# Patient Record
Sex: Male | Born: 1945 | Race: White | Hispanic: No | Smoking: Current some day smoker
Health system: Southern US, Community
[De-identification: ages and names within clinical notes are randomized; demographics above are authoritative.]

## PROBLEM LIST (undated history)

## (undated) DIAGNOSIS — J449 Chronic obstructive pulmonary disease, unspecified: Secondary | ICD-10-CM

## (undated) DIAGNOSIS — I10 Essential (primary) hypertension: Secondary | ICD-10-CM

## (undated) DIAGNOSIS — E785 Hyperlipidemia, unspecified: Secondary | ICD-10-CM

## (undated) DIAGNOSIS — M1990 Unspecified osteoarthritis, unspecified site: Secondary | ICD-10-CM

## (undated) DIAGNOSIS — Z87442 Personal history of urinary calculi: Secondary | ICD-10-CM

## (undated) DIAGNOSIS — Z791 Long term (current) use of non-steroidal anti-inflammatories (NSAID): Secondary | ICD-10-CM

## (undated) DIAGNOSIS — C801 Malignant (primary) neoplasm, unspecified: Secondary | ICD-10-CM

## (undated) DIAGNOSIS — E119 Type 2 diabetes mellitus without complications: Secondary | ICD-10-CM

## (undated) DIAGNOSIS — K529 Noninfective gastroenteritis and colitis, unspecified: Principal | ICD-10-CM

## (undated) DIAGNOSIS — L570 Actinic keratosis: Secondary | ICD-10-CM

## (undated) HISTORY — DX: Actinic keratosis: L57.0

## (undated) HISTORY — DX: Chronic obstructive pulmonary disease, unspecified: J44.9

## (undated) HISTORY — PX: LITHOTRIPSY: SUR834

## (undated) HISTORY — DX: Noninfective gastroenteritis and colitis, unspecified: K52.9

## (undated) HISTORY — DX: Unspecified osteoarthritis, unspecified site: M19.90

## (undated) HISTORY — PX: BASAL CELL CARCINOMA EXCISION: SHX1214

## (undated) HISTORY — DX: Long term (current) use of non-steroidal anti-inflammatories (nsaid): Z79.1

## (undated) HISTORY — PX: CHOLECYSTECTOMY: SHX55

## (undated) HISTORY — PX: BACK SURGERY: SHX140

## (undated) HISTORY — PX: KNEE SURGERY: SHX244

## (undated) HISTORY — PX: SPINE SURGERY: SHX786

## (undated) HISTORY — DX: Hyperlipidemia, unspecified: E78.5

---

## 2002-07-23 ENCOUNTER — Ambulatory Visit (HOSPITAL_COMMUNITY): Admission: RE | Admit: 2002-07-23 | Discharge: 2002-07-23 | Payer: Self-pay | Admitting: Family Medicine

## 2002-07-23 ENCOUNTER — Encounter: Payer: Self-pay | Admitting: Family Medicine

## 2002-07-24 ENCOUNTER — Emergency Department (HOSPITAL_COMMUNITY): Admission: EM | Admit: 2002-07-24 | Discharge: 2002-07-24 | Payer: Self-pay | Admitting: Emergency Medicine

## 2003-08-27 ENCOUNTER — Ambulatory Visit (HOSPITAL_COMMUNITY): Admission: RE | Admit: 2003-08-27 | Discharge: 2003-08-27 | Payer: Self-pay | Admitting: Family Medicine

## 2003-09-09 ENCOUNTER — Ambulatory Visit (HOSPITAL_COMMUNITY): Admission: RE | Admit: 2003-09-09 | Discharge: 2003-09-09 | Payer: Self-pay | Admitting: Internal Medicine

## 2003-09-24 ENCOUNTER — Ambulatory Visit (HOSPITAL_COMMUNITY): Admission: RE | Admit: 2003-09-24 | Discharge: 2003-09-24 | Payer: Self-pay | Admitting: Family Medicine

## 2005-08-04 ENCOUNTER — Ambulatory Visit (HOSPITAL_COMMUNITY): Admission: RE | Admit: 2005-08-04 | Discharge: 2005-08-04 | Payer: Self-pay | Admitting: General Surgery

## 2005-09-19 ENCOUNTER — Other Ambulatory Visit: Admission: RE | Admit: 2005-09-19 | Discharge: 2005-09-19 | Payer: Self-pay | Admitting: General Surgery

## 2006-08-28 ENCOUNTER — Ambulatory Visit (HOSPITAL_COMMUNITY): Admission: RE | Admit: 2006-08-28 | Discharge: 2006-08-28 | Payer: Self-pay | Admitting: Family Medicine

## 2006-09-19 ENCOUNTER — Ambulatory Visit: Payer: Self-pay | Admitting: Orthopedic Surgery

## 2006-10-10 ENCOUNTER — Ambulatory Visit: Payer: Self-pay | Admitting: Orthopedic Surgery

## 2006-10-17 ENCOUNTER — Ambulatory Visit: Payer: Self-pay | Admitting: Orthopedic Surgery

## 2006-10-25 ENCOUNTER — Ambulatory Visit: Payer: Self-pay | Admitting: Orthopedic Surgery

## 2006-11-09 ENCOUNTER — Ambulatory Visit: Payer: Self-pay | Admitting: Orthopedic Surgery

## 2006-11-09 ENCOUNTER — Ambulatory Visit (HOSPITAL_COMMUNITY): Admission: RE | Admit: 2006-11-09 | Discharge: 2006-11-09 | Payer: Self-pay | Admitting: Orthopedic Surgery

## 2006-11-12 ENCOUNTER — Encounter (HOSPITAL_COMMUNITY): Admission: RE | Admit: 2006-11-12 | Discharge: 2006-12-12 | Payer: Self-pay | Admitting: Orthopedic Surgery

## 2006-11-13 ENCOUNTER — Ambulatory Visit: Payer: Self-pay | Admitting: Orthopedic Surgery

## 2006-12-11 ENCOUNTER — Ambulatory Visit: Payer: Self-pay | Admitting: Orthopedic Surgery

## 2010-01-04 ENCOUNTER — Ambulatory Visit (HOSPITAL_COMMUNITY): Admission: RE | Admit: 2010-01-04 | Discharge: 2010-01-04 | Payer: Self-pay | Admitting: Family Medicine

## 2010-01-05 ENCOUNTER — Ambulatory Visit: Payer: Self-pay | Admitting: Gastroenterology

## 2010-01-05 ENCOUNTER — Inpatient Hospital Stay (HOSPITAL_COMMUNITY): Admission: AD | Admit: 2010-01-05 | Discharge: 2010-01-08 | Payer: Self-pay | Admitting: Family Medicine

## 2010-01-06 ENCOUNTER — Ambulatory Visit: Payer: Self-pay | Admitting: Internal Medicine

## 2010-01-07 ENCOUNTER — Encounter (INDEPENDENT_AMBULATORY_CARE_PROVIDER_SITE_OTHER): Payer: Self-pay | Admitting: Family Medicine

## 2010-05-31 ENCOUNTER — Ambulatory Visit (HOSPITAL_COMMUNITY): Admission: RE | Admit: 2010-05-31 | Discharge: 2010-05-31 | Payer: Self-pay | Admitting: Family Medicine

## 2010-07-15 ENCOUNTER — Ambulatory Visit (HOSPITAL_COMMUNITY)
Admission: RE | Admit: 2010-07-15 | Discharge: 2010-07-16 | Payer: Self-pay | Source: Home / Self Care | Attending: Neurological Surgery | Admitting: Neurological Surgery

## 2010-10-18 LAB — SURGICAL PCR SCREEN
MRSA, PCR: NEGATIVE
Staphylococcus aureus: NEGATIVE

## 2010-10-18 LAB — DIFFERENTIAL
Basophils Absolute: 0 10*3/uL (ref 0.0–0.1)
Eosinophils Absolute: 0.2 10*3/uL (ref 0.0–0.7)

## 2010-10-18 LAB — BASIC METABOLIC PANEL
CO2: 28 mEq/L (ref 19–32)
Chloride: 104 mEq/L (ref 96–112)
Creatinine, Ser: 0.97 mg/dL (ref 0.4–1.5)
Potassium: 4.2 mEq/L (ref 3.5–5.1)

## 2010-10-18 LAB — CBC
HCT: 47.6 % (ref 39.0–52.0)
Hemoglobin: 16.1 g/dL (ref 13.0–17.0)
Platelets: 212 10*3/uL (ref 150–400)
RBC: 4.98 MIL/uL (ref 4.22–5.81)
WBC: 12.6 10*3/uL — ABNORMAL HIGH (ref 4.0–10.5)

## 2010-10-18 LAB — APTT: aPTT: 31 seconds (ref 24–37)

## 2010-10-18 LAB — PROTIME-INR: INR: 0.97 (ref 0.00–1.49)

## 2010-10-24 LAB — GLUCOSE, CAPILLARY
Glucose-Capillary: 124 mg/dL — ABNORMAL HIGH (ref 70–99)
Glucose-Capillary: 226 mg/dL — ABNORMAL HIGH (ref 70–99)

## 2010-10-24 LAB — LIPASE, BLOOD
Lipase: 118 U/L — ABNORMAL HIGH (ref 11–59)
Lipase: 170 U/L — ABNORMAL HIGH (ref 11–59)
Lipase: 35 U/L (ref 11–59)
Lipase: 79 U/L — ABNORMAL HIGH (ref 11–59)

## 2010-10-24 LAB — DIFFERENTIAL
Basophils Absolute: 0.1 10*3/uL (ref 0.0–0.1)
Basophils Relative: 0 % (ref 0–1)
Basophils Relative: 1 % (ref 0–1)
Eosinophils Absolute: 0.3 10*3/uL (ref 0.0–0.7)
Eosinophils Absolute: 0.3 10*3/uL (ref 0.0–0.7)
Eosinophils Absolute: 0.3 10*3/uL (ref 0.0–0.7)
Eosinophils Relative: 2 % (ref 0–5)
Eosinophils Relative: 3 % (ref 0–5)
Lymphocytes Relative: 23 % (ref 12–46)
Lymphs Abs: 1.8 10*3/uL (ref 0.7–4.0)
Lymphs Abs: 2.2 10*3/uL (ref 0.7–4.0)
Monocytes Absolute: 0.7 10*3/uL (ref 0.1–1.0)
Neutro Abs: 7.5 10*3/uL (ref 1.7–7.7)
Neutrophils Relative %: 71 % (ref 43–77)

## 2010-10-24 LAB — PROTIME-INR: Prothrombin Time: 13.4 seconds (ref 11.6–15.2)

## 2010-10-24 LAB — CBC
HCT: 39.8 % (ref 39.0–52.0)
HCT: 41.7 % (ref 39.0–52.0)
Hemoglobin: 14.4 g/dL (ref 13.0–17.0)
Hemoglobin: 16.3 g/dL (ref 13.0–17.0)
MCHC: 34.5 g/dL (ref 30.0–36.0)
MCV: 94.6 fL (ref 78.0–100.0)
MCV: 94.9 fL (ref 78.0–100.0)
MCV: 96.2 fL (ref 78.0–100.0)
Platelets: 216 10*3/uL (ref 150–400)
Platelets: 227 10*3/uL (ref 150–400)
RBC: 4.43 MIL/uL (ref 4.22–5.81)
RDW: 14.2 % (ref 11.5–15.5)
WBC: 10.6 10*3/uL — ABNORMAL HIGH (ref 4.0–10.5)
WBC: 11.5 10*3/uL — ABNORMAL HIGH (ref 4.0–10.5)

## 2010-10-24 LAB — HEPATIC FUNCTION PANEL
ALT: 97 U/L — ABNORMAL HIGH (ref 0–53)
Albumin: 3 g/dL — ABNORMAL LOW (ref 3.5–5.2)
Alkaline Phosphatase: 164 U/L — ABNORMAL HIGH (ref 39–117)
Bilirubin, Direct: 1.2 mg/dL — ABNORMAL HIGH (ref 0.0–0.3)
Indirect Bilirubin: 1.1 mg/dL — ABNORMAL HIGH (ref 0.3–0.9)
Indirect Bilirubin: 2 mg/dL — ABNORMAL HIGH (ref 0.3–0.9)
Total Protein: 5.7 g/dL — ABNORMAL LOW (ref 6.0–8.3)
Total Protein: 5.9 g/dL — ABNORMAL LOW (ref 6.0–8.3)

## 2010-10-24 LAB — COMPREHENSIVE METABOLIC PANEL
ALT: 146 U/L — ABNORMAL HIGH (ref 0–53)
Albumin: 3.8 g/dL (ref 3.5–5.2)
Alkaline Phosphatase: 247 U/L — ABNORMAL HIGH (ref 39–117)
BUN: 23 mg/dL (ref 6–23)
Calcium: 9.3 mg/dL (ref 8.4–10.5)
Creatinine, Ser: 0.88 mg/dL (ref 0.4–1.5)
GFR calc Af Amer: >60 mL/min (ref >60)
GFR calc non Af Amer: >60 mL/min (ref >60)
Glucose, Bld: 94 mg/dL (ref 70–99)
Total Protein: 7.1 g/dL (ref 6.0–8.3)

## 2010-10-24 LAB — AMYLASE: Amylase: 256 U/L — ABNORMAL HIGH (ref 0–105)

## 2010-10-24 LAB — BASIC METABOLIC PANEL
BUN: 15 mg/dL (ref 6–23)
BUN: 6 mg/dL (ref 6–23)
CO2: 27 mEq/L (ref 19–32)
CO2: 28 mEq/L (ref 19–32)
Chloride: 102 mEq/L (ref 96–112)
Glucose, Bld: 101 mg/dL — ABNORMAL HIGH (ref 70–99)
Glucose, Bld: 121 mg/dL — ABNORMAL HIGH (ref 70–99)
Potassium: 3.7 mEq/L (ref 3.5–5.1)

## 2010-10-24 LAB — APTT: aPTT: 30 seconds (ref 24–37)

## 2010-10-24 LAB — CULTURE, BLOOD (ROUTINE X 2)
Culture: NO GROWTH
Report Status: 6062011

## 2010-10-26 ENCOUNTER — Other Ambulatory Visit: Payer: Self-pay | Admitting: Dermatology

## 2010-12-06 DIAGNOSIS — K529 Noninfective gastroenteritis and colitis, unspecified: Secondary | ICD-10-CM

## 2010-12-06 HISTORY — DX: Noninfective gastroenteritis and colitis, unspecified: K52.9

## 2010-12-22 ENCOUNTER — Encounter (HOSPITAL_COMMUNITY): Payer: Self-pay | Admitting: Radiology

## 2010-12-22 ENCOUNTER — Emergency Department (HOSPITAL_COMMUNITY): Payer: Medicare Other

## 2010-12-22 ENCOUNTER — Observation Stay (HOSPITAL_COMMUNITY)
Admission: EM | Admit: 2010-12-22 | Discharge: 2010-12-23 | DRG: 392 | Disposition: A | Discharge status: Home / Self Care | Payer: Medicare Other | Attending: Family Medicine | Admitting: Family Medicine

## 2010-12-22 DIAGNOSIS — Z791 Long term (current) use of non-steroidal anti-inflammatories (NSAID): Secondary | ICD-10-CM

## 2010-12-22 DIAGNOSIS — K5289 Other specified noninfective gastroenteritis and colitis: Secondary | ICD-10-CM

## 2010-12-22 DIAGNOSIS — Z7982 Long term (current) use of aspirin: Secondary | ICD-10-CM

## 2010-12-22 DIAGNOSIS — R109 Unspecified abdominal pain: Secondary | ICD-10-CM | POA: Insufficient documentation

## 2010-12-22 DIAGNOSIS — E785 Hyperlipidemia, unspecified: Secondary | ICD-10-CM | POA: Diagnosis present

## 2010-12-22 DIAGNOSIS — J4489 Other specified chronic obstructive pulmonary disease: Secondary | ICD-10-CM | POA: Diagnosis present

## 2010-12-22 DIAGNOSIS — A09 Infectious gastroenteritis and colitis, unspecified: Principal | ICD-10-CM | POA: Diagnosis present

## 2010-12-22 DIAGNOSIS — J449 Chronic obstructive pulmonary disease, unspecified: Secondary | ICD-10-CM | POA: Diagnosis present

## 2010-12-22 DIAGNOSIS — M48061 Spinal stenosis, lumbar region without neurogenic claudication: Secondary | ICD-10-CM | POA: Diagnosis present

## 2010-12-22 DIAGNOSIS — I10 Essential (primary) hypertension: Secondary | ICD-10-CM | POA: Diagnosis present

## 2010-12-22 DIAGNOSIS — K625 Hemorrhage of anus and rectum: Secondary | ICD-10-CM

## 2010-12-22 HISTORY — DX: Essential (primary) hypertension: I10

## 2010-12-22 LAB — URINE MICROSCOPIC-ADD ON

## 2010-12-22 LAB — DIFFERENTIAL
Basophils Absolute: 0 10*3/uL (ref 0.0–0.1)
Eosinophils Absolute: 0.1 10*3/uL (ref 0.0–0.7)
Lymphs Abs: 2.8 10*3/uL (ref 0.7–4.0)
Monocytes Absolute: 0.8 10*3/uL (ref 0.1–1.0)
Monocytes Relative: 6 % (ref 3–12)

## 2010-12-22 LAB — COMPREHENSIVE METABOLIC PANEL
Albumin: 4.1 g/dL (ref 3.5–5.2)
BUN: 15 mg/dL (ref 6–23)
Creatinine, Ser: 0.74 mg/dL (ref 0.4–1.5)
Glucose, Bld: 107 mg/dL — ABNORMAL HIGH (ref 70–99)
Total Bilirubin: 0.9 mg/dL (ref 0.3–1.2)
Total Protein: 6.8 g/dL (ref 6.0–8.3)

## 2010-12-22 LAB — URINALYSIS, ROUTINE W REFLEX MICROSCOPIC
Leukocytes, UA: NEGATIVE
Protein, ur: NEGATIVE mg/dL
Urobilinogen, UA: 0.2 mg/dL (ref 0.0–1.0)

## 2010-12-22 LAB — CBC
Hemoglobin: 17.3 g/dL — ABNORMAL HIGH (ref 13.0–17.0)
MCHC: 34.9 g/dL (ref 30.0–36.0)
RDW: 13.1 % (ref 11.5–15.5)

## 2010-12-22 LAB — LIPASE, BLOOD: Lipase: 16 U/L (ref 11–59)

## 2010-12-22 MED ORDER — IOHEXOL 300 MG/ML  SOLN
100.0000 mL | Freq: Once | INTRAMUSCULAR | Status: AC | PRN
  Administered 2010-12-22: 100 mL via INTRAVENOUS

## 2010-12-23 LAB — CBC
Hemoglobin: 15.6 g/dL (ref 13.0–17.0)
MCH: 32.1 pg (ref 26.0–34.0)
MCV: 95.9 fL (ref 78.0–100.0)
RBC: 4.86 MIL/uL (ref 4.22–5.81)

## 2010-12-23 LAB — DIFFERENTIAL
Lymphocytes Relative: 23 % (ref 12–46)
Lymphs Abs: 2.5 10*3/uL (ref 0.7–4.0)
Monocytes Relative: 5 % (ref 3–12)
Neutro Abs: 7.5 10*3/uL (ref 1.7–7.7)
Neutrophils Relative %: 70 % (ref 43–77)

## 2010-12-23 NOTE — Op Note (Signed)
NAMEJONI, NORROD                  ACCOUNT NO.:  [id number]   MEDICAL RECORD NO.:  [id number]          PATIENT TYPE:  AMB   LOCATION:  DAY                           FACILITY:  [facility]   PHYSICIAN:  Vickki Hearing, M.D.DATE OF BIRTH:  1946/07/01   DATE OF PROCEDURE:  11/09/2006  DATE OF DISCHARGE:                               OPERATIVE REPORT   CHIEF COMPLAINT:  Pain, left knee.   PRIMARY INDICATION FOR PROCEDURE:  Persistent pain, left knee, which did  not respond to nonoperative treatment.   PREOPERATIVE DIAGNOSIS:  Torn medial meniscus and osteoarthritis, left  knee.   POSTOPERATIVE DIAGNOSES:  1. Torn medial meniscus and osteoarthritis, left knee.  2. Medial plica.   PROCEDURE:  1. Arthroscopy of left knee.  2. Partial medial meniscectomy.  3. Chondroplasty, medial femoral condyle.  4. Resection of medial plica.   SURGEON:  Vickki Hearing, M.D.   ASSISTANTS:  No assistants.   ANESTHETIC:  LMA/general.   OPERATIVE FINDINGS:  Torn medial meniscus at the far and most posterior  aspect of the medial horn; it was a radial tear.  There were chondral  changes, grade 2 in depth, grade 3 in area of the medial femoral  condyle.  There were grade 2 changes also on the trochlea.  There was a  plica medially.   DESCRIPTION OF PROCEDURE:  Procedure was done as follows:  The left knee  was marked for surgery, countersigned by the surgeon, history and  physical updated, antibiotic started.  The patient was taken to the  operating room and given an LMA anesthetic.  Sterile prep and drape were  performed.  Time-out procedure was completed.   Standard arthroscopy portals were established.  Diagnostic arthroscopy  was performed.  Through a medial portal, a probe was placed and a second-  look using a probe to palpate the articular structures was performed.  A  shaver was placed in the knee and the medial meniscus tear was resected  and then balanced with a shaver and a  duckbill forceps.  The  chondroplasty on the patella was performed with a Paragon arthroscopy  instrument and the plica was resected with the shaver and a biter.   The knee was irrigated and closed with Steri-Strips.  We placed 60 mL of  Marcaine total in the joint, 30 preop, 30 postop.  We added sterile  dressings, Ace bandage, Cryo Cuff and he was taken to recovery room  after extubation in stable condition and can be full weightbearing.  Follow up next week and he will have therapy next week.      Vickki Hearing, M.D.  Electronically Signed     SEH/MEDQ  D:  11/09/2006  T:  11/10/2006  Job:  [id number]

## 2010-12-23 NOTE — H&P (Signed)
NAMEERYK, BEAVERS                  ACCOUNT NO.:  [id number]   MEDICAL RECORD NO.:  [id number]           PATIENT TYPE:   LOCATION:                                 FACILITY:   PHYSICIAN:  Vickki Hearing, M.D.DATE OF BIRTH:  1945/12/03   DATE OF ADMISSION:  10/25/2006  DATE OF DISCHARGE:  LH                              HISTORY & PHYSICAL   CHIEF COMPLAINT:  Pain, left knee.   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   MEDICATIONS:  He takes Micardis, Crestor, [place] and Chantix.   FAMILY HISTORY:  He has a family history of heart and lung disease,  arthritis and cancer.   SOCIAL HISTORY:  He is a nonsmoker, social drinker, with an associates  degree.   ALLERGIES:  NO ALLERGIES.   REVIEW OF SYSTEMS:  Left knee pain.   HISTORY OF PRESENT ILLNESS:  He started having left knee pain in  December of 2007.  His pain started laterally, radiated down the side of  his leg into his ankle and then rested behind his knee cap.  His pain is  rated as 5/10, but can be as high as 10.  His symptoms were unrelieved  by Naprosyn but improved with Vicodin and cold packs, was made worse by  walking and kneeling.  The pain he feels is described as an ache which  is constant and associated with knee swelling.   HOSPITAL COURSE:  After evaluation here showed that he did have some  patellofemoral disease on x-ray, we aspirated and injected his knee;  aspirated 12 mL of clear yellow fluid, injected 40 mg of Depo-Medrol,  and he was seen in follow-up.  At the time of follow-up his pain had not  been relieved and his swelling had returned, so we sent him for an  initial MRI evaluation.  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  He was sent back  for an IV contrast MRI which revealed a small tumor which was apparently  benign and could be followed in a 80-month period with repeat IV contrast  MRI.  It also revealed that he did, indeed, have a medial meniscal tear  and, for that reason, he has been scheduled for surgery to address that;  he may need a chondroplasty, as well.   The informed consent process has been completed; the patient understands  his treatment plan and agrees with it.   PHYSICAL EXAMINATION:  VITAL SIGNS:  On exam his weight is 235, normal  pulses in low 80s to 86, respiratory rate in low teens to 16.  GENERAL  APPEARANCE:  He is well-developed and nourished, grooming and  hygiene are normal.  Body habitus is medium.  Peripheral vascular system  has normal pulse, perfusion and temperature; no swelling or edema.  No  varicose veins of any significance.  Groin adenopathy negative.  Gait  and station are normal.  EXTREMITIES:  The right knee shows full range of motion, no swelling,  mild varus.  Motor exam normal.  Ligaments are intact.  Left knee  examination shows continued trace amount of fluid, tenderness at the  inferior pole of the patella and medial compartment.  There is varus  alignment to the knee.  Motor strength is normal.  Ligaments are stable.  Meniscal signs remain relatively normal.  SKIN:  Examination reveals  warm, dry and intact skin with no rash, lesions or subcutaneous mass.  NEUROLOGIC:  Findings include normal coordination, reflexes and  sensation.  PSYCHIATRIC:  Findings include normal mood and he is alert and oriented  x3.   MEDICAL DECISION-MAKING:  Radiographs, again, show patellofemoral  disease.   MRI was done at [facility] and shows a radial tear of the medial  meniscus posterior horn, and cartilage defect in the weightbearing  surface of the medial femoral condyle.  There is a loculated cyst which  reflects intra-articular  fluid and there is a small circumcised focus  which is fusiform, abutting the medial gastroc with post-contrast and  enhancement, consistent with tumor.  I have discussed this with the  radiologist and he agrees that this can be followed with a repeat  contrast MRI via the IV method.  In the interim, he will have  arthroscopy of the left knee.      Vickki Hearing, M.D.  Electronically Signed     SEH/MEDQ  D:  10/25/2006  T:  10/25/2006  Job:  [id number]

## 2010-12-25 NOTE — Group Therapy Note (Signed)
  [name], [name]                  ACCOUNT NO.:  [id number]  MEDICAL RECORD NO.:  [id number]           PATIENT TYPE:  I  LOCATION:  A330                          FACILITY:  [facility]  PHYSICIAN:  Kingsley Callander. Ouida Sills, MD       DATE OF BIRTH:  June 09, 1946  DATE OF PROCEDURE: DATE OF DISCHARGE:                                PROGRESS NOTE   HISTORY OF PRESENT ILLNESS:  Mr. Brockman was admitted yesterday after experiencing rectal bleeding for 2 days.  He underwent evaluation in the emergency room with a CT scan which revealed evidence of colitis at the hepatic flexure.  His hemoglobin on admission was 17.3.  He denies any bleeding since that time.  He has been hemodynamically stable.  His blood pressure today is 119/68 with a pulse of 70.  Temperature is 98.3, respirations are 16.  He denies any abdominal pain now.  He had a barium enema he reports in 2005.  This was read as negative.  The patient has recently been on aspirin 81 mg daily and naproxen 500 mg b.i.d.  He stopped these 2 days ago.  PHYSICAL EXAMINATION:  GENERAL:  He appears comfortable.  He is alert and oriented. LUNGS:  Clear. HEART:  Regular with no murmurs. ABDOMEN:  Soft and nontender with no hepatosplenomegaly.  IMPRESSION/PLAN: 1. Rectal bleeding.  Hepatic flexure colitis.  Gastroenterology has     been consulted.  His hemoglobin will be rechecked today. 2. History of hypertension treated with Micardis and verapamil.  These     are both on hold. 3. History of hyperlipidemia.  Continue Crestor. 4. Status post cholecystectomy after a bout of gallstone pancreatitis     in June 2011. 5. Status post spinal stenosis surgery December 2011.     Kingsley Callander. Ouida Sills, MD     ROF/MEDQ  D:  12/23/2010  T:  12/23/2010  Job:  [id number]  Electronically Signed by Carylon Perches MD on 12/25/2010 10:32:35 AM

## 2010-12-27 LAB — STOOL CULTURE

## 2010-12-27 NOTE — H&P (Signed)
  [name], [name]                  ACCOUNT NO.:  [id number]  MEDICAL RECORD NO.:  [id number]           PATIENT TYPE:  I  LOCATION:  A330                          FACILITY:  [facility]  PHYSICIAN:  Luie Laneve G. Renard Matter, MD   DATE OF BIRTH:  12-07-1945  DATE OF ADMISSION:  12/22/2010 DATE OF DISCHARGE:  LH                             HISTORY & PHYSICAL   This 65 year old white male presented to the ED with a chief complaint of rectal bleeding.  Apparently, he noticed this last evening and more today.  He has several stools yesterday and noted blood.  He apparently did not have any abdominal pain of any severity.  This was thought by ED physician to be bloody diarrhea, also had some nausea.  He was seen and evaluated by ED physician.  His hemoglobin is 17.3, hematocrit 49.6. Chemistries were within normal limits.  UA negative.  The patient had CT of the abdomen, which showed focal segmental colitis involving hepatic flexure likely inflammatory or infections.  IV fluid was started and the patient had 2 units of packed RBCs typed and screened.  The patient was subsequently admitted.  SOCIAL HISTORY:  Does not drink alcohol or use drugs.  He currently smokes.  PAST MEDICAL HISTORY:  Hypertension, arthritis and hyperlipidemia.  SURGICAL HISTORY:  The patient had cholecystectomy, does have chronic obstructive pulmonary disease, has a history of decompressive lumbar hemilaminectomy, medial facetectomy.  ALLERGIES:  No known drug allergies.  MEDICATION LIST: 1. Crestor 10 mg daily. 2. Micardis daily. 3. Naprosyn 500 mg b.i.d. 4. Verapamil daily.  REVIEW OF SYSTEMS:  HEENT:  Negative.  CARDIOPULMONARY:  No cough, hemoptysis or dyspnea.  GI:  Episodes of nausea, minimal abdominal pain, rectal bleeding.  GU:  No dysuria or hematuria.  PHYSICAL EXAMINATION:  GENERAL/VITAL SIGNS:  Alert white male with blood pressure 135/80, pulse 67, respirations 16. HEENT:  Eyes: PERRLA.  TM negative.   Oropharynx benign. NECK:  Supple.  No JVD or thyroid abnormalities. HEART:  Regular rhythm, no murmurs. LUNGS:  Clear to P and A. ABDOMEN:  No palpable organs or masses. RECTAL:  Gross blood on examining finger. EXTREMITIES:  Free of edema. NEUROLOGIC:  No focal deficit.  Cranial nerves intact.  ASSESSMENT:  The patient was admitted with rectal bleeding of undetermined source.  He does have on CT of the abdomen focal segmental colitis involving the hepatic flexure likely inflammatory or infectious.  PLAN:  Plan is to start the patient on IV fluids.  Continue to monitor hemoglobin and hematocrit.  We will type and screen for blood as needed. GI Service has been consulted.     Nellene Courtois G. Renard Matter, MD     AGM/MEDQ  D:  12/22/2010  T:  12/23/2010  Job:  [id number]  Electronically Signed by Butch Penny MD on 12/27/2010 07:09:38 AM

## 2010-12-29 NOTE — Discharge Summary (Signed)
  [name], [name]                  ACCOUNT NO.:  [id number]  MEDICAL RECORD NO.:  [id number]           PATIENT TYPE:  I  LOCATION:  A330                          FACILITY:  [facility]  PHYSICIAN:  Kingsley Callander. Ouida Sills, MD       DATE OF BIRTH:  1945/12/14  DATE OF ADMISSION:  12/22/2010 DATE OF DISCHARGE:  05/18/2012LH                              DISCHARGE SUMMARY   DISCHARGE DIAGNOSES: 1. Hepatic flexure colitis with rectal bleeding. 2. Hypertension. 3. Hyperlipidemia. 4. Spinal stenosis.  HOSPITAL COURSE:  This patient is a 65 year old male patient of Dr. Janna Arch who was admitted by Dr. Renard Matter on Thursday with rectal bleeding.  He underwent a CT scan in the emergency room which revealed evidence of colitis at his hepatic flexure.  His hemoglobin was 17.3. His white count was 13,200.  The patient recently had some diarrhea.  He was seen in gastroenterology consultation by Dr. Darrick Penna.  He was felt to likely be suffering from an infectious colitis and was treated with ciprofloxacin and metronidazole.  His hemoglobin dropped to 15.  He was hemodynamically stable.  Consideration was given to colonoscopy which can be performed as an outpatient within the next couple of weeks.  The patient was improved and stable for discharge on the afternoon of the 18th.  He will be seen in followup by Dr. Janna Arch next week.  He will hold aspirin and naproxen.  He will have a low-residue diet.  DISCHARGE MEDICATIONS: 1. Micardis HCT 40/12.5 mg daily. 2. Verapamil SR 240 mg daily. 3. Crestor 10 mg daily. 4. Multivitamin daily. 5. Cipro 500 mg b.i.d. 6. Metronidazole 500 mg t.i.d.     Kingsley Callander. Ouida Sills, MD     ROF/MEDQ  D:  12/27/2010  T:  12/27/2010  Job:  [id number]  Electronically Signed by Carylon Perches MD on 12/29/2010 07:55:24 AM

## 2011-01-24 NOTE — Consult Note (Signed)
[name], [name]                  ACCOUNT NO.:  [id number]  MEDICAL RECORD NO.:  [id number]           PATIENT TYPE:  I  LOCATION:  A330                          FACILITY:  [facility]  PHYSICIAN:  [name], M.D.     DATE OF BIRTH:  1945/08/13  DATE OF CONSULTATION:  12/23/2010 DATE OF DISCHARGE:                                CONSULTATION   PHYSICIAN REQUESTING CONSULTATION:  Dr. Carylon Perches covering for Dr. Oval Linsey.  COSIGNER:  John Eva, MD  REASON FOR CONSULTATION:  Rectal bleeding.  HISTORY OF PRESENT ILLNESS:  The patient is a very pleasant 65 year old Caucasian gentleman who presented to the emergency department yesterday evening with complaints of several episodes of moderate volume hematochezia.  He states earlier in the week he started having abdominal cramping and loose stools as well as nausea.  Wednesday after several episodes of diarrhea, he started to pass some blood per rectum.  He had several episodes and decided to come to the emergency department last night.  He complains of mid abdominal pain.  Last week he felt fine.  He has had no vomiting.  He has been on clear liquids for about 2-3 days.  In the emergency department, he had a hemoglobin of 17.3, white blood cell count 13,200, platelets appeared to be clumped but appeared be adequate.  On review, his lipase was normal at 16.  Sodium 138, potassium 4.1, BUN 15, creatinine 0.74, glucose 107, LFTs were normal. He had a CT of the abdomen and pelvis with contrast which showed a 3.3 cm simple-appearing epicardial cyst on the right side, mild fatty liver, terminal ileum appeared normal, appendix appeared normal, focal segmental wall thickening and submucosal edema involving the hepatic flexure.  It was felt this is likely due to inflammatory infectious process given that his SMA was widely patent.  There was no mesenteric edema.  This morning he states he feels fine.  He had clear liquids  for breakfast.  No bowel movements this morning.  MEDICATIONS AT HOME: 1. Micardis HCT 40/12.5 mg daily. 2. Verapamil SR 240 mg daily. 3. Crestor 10 mg daily. 4. Multivitamins daily. 5. Vicodin 10/325 mg every 6 hours as needed. 6. Aspirin 81 mg daily. 7. He also takes naproxen 500 mg b.i.d. and he has been on this for     years.  ALLERGIES:  No known drug allergies.  PAST MEDICAL HISTORY:  Hyperlipidemia, hypertension, arthritis requiring chronic naproxen use, history of nephrolithiasis, COPD, actinic keratosis.  PAST SURGICAL HISTORY: 1. He had lithotripsy in the 80s. 2. Cholecystectomy for gallstone pancreatitis and cholecystitis in     June 2011. 3. Back surgery in December 2011. 4. Last month he had basal cell carcinoma removed from his face. 5. He had left knee arthroscopy in April 2008.  FAMILY HISTORY:  He denies any family history of colon cancer, colon polyps.  His father died of lung cancer.  Mother is living and is a Agricultural consultant in the gift shop at the hospital.  SOCIAL HISTORY:  He smokes "too much."  He denies any alcohol or drug use.  He is  divorced.  No children.  He is a Surveyor, quantity at [employer].  REVIEW OF SYSTEMS:  See HPI for GI.  CONSTITUTIONAL:  Denies any weight loss.  CARDIOPULMONARY:  Denies chest pain, shortness of breath, cough. GENITOURINARY:  Denies dysuria, hematuria.  PHYSICAL EXAMINATION:  VITAL SIGNS:  Weight has not been obtained. Temperature 98.3, pulse 70, respirations 16, blood pressure 119/68, O2 sats 91% on room air. GENERAL:  Pleasant, elderly Caucasian gentleman, in no acute distress. SKIN:  Warm and dry.  No jaundice. HEENT:  Sclerae nonicteric.  He has a scab to the right of the nose from recent removal of basal cell carcinoma.  It appears to be healing nicely.  Oropharyngeal mucosa moist and pink.  No lesions. CHEST:  Lungs are clear to auscultation. CARDIAC:  Regular rate and rhythm.  No murmurs. ABDOMEN:   Positive bowel sounds, soft, nontender, nondistended.  No organomegaly or masses.  No rebound or guarding.  No abdominal bruits or hernias. LOWER EXTREMITIES:  No edema.  LABORATORY DATA:  As outlined above.  CT as outlined above.  IMPRESSION:  [name] is a pleasant 65 year old gentleman who presented with several-day history of diarrhea and subsequently moderate volume hematochezia associated with abdominal pain and nausea.  Symptoms were acute onset.  CT shows segmental colitis of the hepatic flexure. Differential diagnosis includes infectious process, less likely inflammatory or ischemic colitis.  No evidence of mass suggestive of colon cancer.  He is on chronic naproxen and aspirin use.  He has never had a colonoscopy.  Not mentioned above, he did have a barium enema in 2005 which was negative.  No prior colonoscopy.  RECOMMENDATIONS: 1. We will treat this as an infectious process.  We will start Cipro     and Flagyl. 2. Begin low residue diet.  He should continue this for 2 weeks.     Handout will be provided prior to his discharge. 3. Followup pending CBC.  If he is able to tolerate food, has no     further significant bleeding, the pain is     manageable, he may be able to go home later this afternoon.  Dr.     Darrick Penna will evaluate him and make decision. 4. Colonoscopy in 3-4 weeks unless he develops significant bleeding,     which would necessitate colonoscopy more acutely.  I would like to     thank Dr. Ouida Sills for allowing [place] to participate in the care of this     patient.     Tana Coast, P.A.   ______________________________ [name], M.D.    LL/MEDQ  D:  12/23/2010  T:  12/23/2010  Job:  [id number]  cc:   Melvyn Novas, MD Fax: [phone]  Kingsley Callander. Ouida Sills, MD Fax: [phone]  Electronically Signed by Tana Coast P.A. on 12/23/2010 02:51:23 PM Electronically Signed by [name] M.D. on 01/24/2011 01:13:30 PM

## 2011-01-25 ENCOUNTER — Ambulatory Visit (INDEPENDENT_AMBULATORY_CARE_PROVIDER_SITE_OTHER): Payer: Medicare Other | Admitting: Gastroenterology

## 2011-01-25 ENCOUNTER — Encounter: Payer: Self-pay | Admitting: Gastroenterology

## 2011-01-25 DIAGNOSIS — K529 Noninfective gastroenteritis and colitis, unspecified: Secondary | ICD-10-CM | POA: Insufficient documentation

## 2011-01-25 DIAGNOSIS — K5289 Other specified noninfective gastroenteritis and colitis: Secondary | ICD-10-CM

## 2011-01-25 DIAGNOSIS — Z1211 Encounter for screening for malignant neoplasm of colon: Secondary | ICD-10-CM

## 2011-01-25 NOTE — Assessment & Plan Note (Signed)
AVERAGE RISK.  TCS June 26 MOVIPREP. OPV PRN.

## 2011-01-25 NOTE — Progress Notes (Signed)
  Subjective:    Patient ID: [name], male    DOB: 26-Mar-1946, 65 y.o.   MRN: [id number]  PCP: DONDIEGO  HPI No more rectal bleeding or abd pain. Bms: 1x/day. No d iarrhea, weight loss, or change in bowel habits. Eating less firber since having colitis.  Past Medical History  Diagnosis Date  . Hypertension   . Colitis MAY 2012     CT ABD/PELVIS HEP FLEXURE  . Hyperlipemia   . Arthritis   . NSAID long-term use NAPROXEN FOR OA  . COPD (chronic obstructive pulmonary disease)   . Actinic keratosis   . Kidney stones    Past Surgical History  Procedure Date  . Cholecystectomy JUNE 2011 MJ    STONES, PANCREATITIS  . Back surgery   . Basal cell carcinoma excision FACE  . Knee surgery LEFT  . Lithotripsy 80s   No Known Allergies Current Outpatient Prescriptions  Medication Sig Dispense Refill  . aspirin 81 MG tablet Take 81 mg by mouth daily.        . rosuvastatin (CRESTOR) 10 MG tablet Take 10 mg by mouth daily.        Marland Kitchen telmisartan-hydrochlorothiazide (MICARDIS HCT) 40-12.5 MG per tablet Take 1 tablet by mouth daily.        . verapamil (COVERA HS) 240 MG (CO) 24 hr tablet Take 240 mg by mouth at bedtime.         Family History  Problem Relation Age of Onset  . Colon polyps Neg Hx   . 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      Review of Systems     Objective:   Physical Exam  Constitutional: He is oriented to person, place, and time. He appears well-developed and well-nourished. No distress.  HENT:  Head: Normocephalic and atraumatic.  Mouth/Throat: Oropharynx is clear and moist. No oropharyngeal exudate.  Eyes: Pupils are equal, round, and reactive to light.  Cardiovascular: Normal rate, regular rhythm and normal heart sounds.   Pulmonary/Chest: Effort normal and breath sounds normal.  Abdominal: Soft. Bowel sounds are normal. He exhibits  no distension. There is no tenderness.       SLIGHTLY OBESE  Musculoskeletal: He exhibits no edema.  Neurological: He is alert and oriented to person, place, and time.  Psychiatric: He has a normal mood and affect.          Assessment & Plan:

## 2011-01-25 NOTE — Assessment & Plan Note (Signed)
RESOLVED. 

## 2011-01-26 NOTE — Progress Notes (Signed)
Cc to PCP 

## 2011-01-31 ENCOUNTER — Encounter: Payer: Medicare Other | Admitting: Gastroenterology

## 2011-01-31 ENCOUNTER — Ambulatory Visit (HOSPITAL_COMMUNITY)
Admission: RE | Admit: 2011-01-31 | Discharge: 2011-01-31 | Disposition: A | Discharge status: Home / Self Care | Payer: Medicare Other | Source: Ambulatory Visit | Attending: Gastroenterology | Admitting: Gastroenterology

## 2011-01-31 ENCOUNTER — Other Ambulatory Visit: Payer: Self-pay | Admitting: Gastroenterology

## 2011-01-31 DIAGNOSIS — K621 Rectal polyp: Secondary | ICD-10-CM

## 2011-01-31 DIAGNOSIS — Z7982 Long term (current) use of aspirin: Secondary | ICD-10-CM | POA: Insufficient documentation

## 2011-01-31 DIAGNOSIS — K648 Other hemorrhoids: Secondary | ICD-10-CM | POA: Insufficient documentation

## 2011-01-31 DIAGNOSIS — I10 Essential (primary) hypertension: Secondary | ICD-10-CM | POA: Insufficient documentation

## 2011-01-31 DIAGNOSIS — Z79899 Other long term (current) drug therapy: Secondary | ICD-10-CM | POA: Insufficient documentation

## 2011-01-31 DIAGNOSIS — D126 Benign neoplasm of colon, unspecified: Secondary | ICD-10-CM | POA: Insufficient documentation

## 2011-01-31 DIAGNOSIS — Z1211 Encounter for screening for malignant neoplasm of colon: Secondary | ICD-10-CM

## 2011-01-31 DIAGNOSIS — K620 Anal polyp: Secondary | ICD-10-CM

## 2011-01-31 DIAGNOSIS — D129 Benign neoplasm of anus and anal canal: Secondary | ICD-10-CM | POA: Insufficient documentation

## 2011-01-31 DIAGNOSIS — D128 Benign neoplasm of rectum: Secondary | ICD-10-CM | POA: Insufficient documentation

## 2011-02-06 NOTE — Progress Notes (Signed)
Cc path to PCP 

## 2011-03-01 NOTE — Op Note (Signed)
  [name], [name] NO.:  [id number]  MEDICAL RECORD NO.:  [id number]  LOCATION:  DAYP                          FACILITY:  [facility]  PHYSICIAN:  Jonette Eva, M.D.     DATE OF BIRTH:  Sep 01, 1945  DATE OF PROCEDURE:  01/31/2011 DATE OF DISCHARGE:                              OPERATIVE REPORT   REFERRING PHYSICIAN:  Melvyn Novas, MD  PROCEDURE:  Colonoscopy with snare cautery polypectomy.  INDICATION FOR EXAM:  [name] is a 65 year old male who presents for average risk colon cancer screening.  FINDINGS: 1. A 6-mm sessile hepatic flexure polyp removed via snare cautery.     Two flat polyps with central depression removed in the rectum.     Each polyp was between the 5-6 mm.  The polyps were sent in.  The     rectal polyp was sent in a separate bottle.  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 2. Moderate internal hemorrhoids.  Otherwise, normal retroflex view of     the rectum.  RECOMMENDATIONS: 1. Screening colonoscopy in 10 years if he has simple adenoma.     Screening colonoscopy in 5 years if he has an advanced adenoma     without dysplasia. 2. We will call him with the results of his biopsies. 3. He should follow high-fiber diet.  He is given a handout on high-     fiber diet, polyps, and hemorrhoids. 4. No aspirin, NSAIDs, or anticoagulation for 3 days.  MEDICATIONS: 1. Demerol 50 mg IV. 2. Versed 3 mg IV.  PROCEDURE TECHNIQUE:  Physical exam was performed.  Informed consent was obtained from the patient after explaining benefits, risks, and alternatives to the procedure.  The patient was connected to the monitor and placed in the left lateral position.  Continuous oxygen was provided by nasal cannula and IV medicine was administered through an indwelling cannula.  After administration of sedation and rectal exam, the patient's rectum was intubated and the scope was advanced under direct  visualization to the cecum.  The scope was removed slowly by carefully examining the color, texture, anatomy, and integrity of the mucosa on the way out.  The patient was recovered in an endoscopy and discharged home in satisfactory condition.  PATH: SERRATED ADENOMA, & HYPERPLASTIC POLYP-TCS IN 10 YEARS.   Jonette Eva, M.D.     SF/MEDQ  D:  01/31/2011  T:  02/01/2011  Job:  [id number]  cc:   Melvyn Novas, MD Fax: [phone]  Electronically Signed by Jonette Eva M.D. on 03/01/2011 12:30:26 PM

## 2015-02-03 ENCOUNTER — Other Ambulatory Visit (HOSPITAL_COMMUNITY): Payer: Self-pay | Admitting: Family Medicine

## 2015-02-03 ENCOUNTER — Ambulatory Visit (HOSPITAL_COMMUNITY)
Admission: RE | Admit: 2015-02-03 | Discharge: 2015-02-03 | Disposition: A | Discharge status: Home / Self Care | Payer: Medicare Other | Source: Ambulatory Visit | Attending: Family Medicine | Admitting: Family Medicine

## 2015-02-03 DIAGNOSIS — M170 Bilateral primary osteoarthritis of knee: Secondary | ICD-10-CM

## 2015-02-03 DIAGNOSIS — M25561 Pain in right knee: Secondary | ICD-10-CM | POA: Diagnosis present

## 2015-02-03 DIAGNOSIS — M79672 Pain in left foot: Secondary | ICD-10-CM | POA: Diagnosis not present

## 2015-02-03 DIAGNOSIS — M25562 Pain in left knee: Secondary | ICD-10-CM | POA: Insufficient documentation

## 2015-02-03 DIAGNOSIS — M79671 Pain in right foot: Secondary | ICD-10-CM | POA: Insufficient documentation

## 2015-02-18 ENCOUNTER — Ambulatory Visit (INDEPENDENT_AMBULATORY_CARE_PROVIDER_SITE_OTHER): Payer: Medicare Other | Admitting: Orthopedic Surgery

## 2015-02-18 ENCOUNTER — Encounter: Payer: Self-pay | Admitting: Orthopedic Surgery

## 2015-02-18 VITALS — BP 122/70 | Ht 70.0 in | Wt 220.0 lb

## 2015-02-18 DIAGNOSIS — M48061 Spinal stenosis, lumbar region without neurogenic claudication: Secondary | ICD-10-CM

## 2015-02-18 DIAGNOSIS — M170 Bilateral primary osteoarthritis of knee: Secondary | ICD-10-CM | POA: Diagnosis not present

## 2015-02-18 DIAGNOSIS — M4806 Spinal stenosis, lumbar region: Secondary | ICD-10-CM

## 2015-02-18 MED ORDER — DICLOFENAC POTASSIUM 50 MG PO TABS: 50.0000 mg | ORAL_TABLET | Freq: Two times a day (BID) | ORAL | Status: DC

## 2015-02-18 NOTE — Progress Notes (Signed)
Patient ID: [name], male   DOB: April 12, 1946, 69 y.o.   MRN: [id number] New   Chief Complaint  Patient presents with  . Knee Pain    bilateral knee pain x 1 month, no known injury, ref Holbrook is a 69 y.o. male.   HPI 69 year old male presents with one-month history of knee pain involved right knee pain was posterior as a walk from his house to his car. He had an ultrasound that was negative. He complains of catching and giving out of the right knee numbness and tingling of the left knee status post lumbar decompression in 2010 by Dr. Arnoldo Morale  Left sided numbness is constant right-sided anteromedial knee pain is intermittent with weightbearing and not relieved by naproxen. He's been on naproxen for 10 years  Joint pain stiffness joints numbness tingling G UGI normal no fevers constitutional symptoms negative Review of Systems See hpi  Past Medical History  Diagnosis Date  . Hypertension   . Colitis MAY 2012     CT ABD/PELVIS HEP FLEXURE  . Hyperlipemia   . Arthritis   . NSAID long-term use NAPROXEN FOR OA  . COPD (chronic obstructive pulmonary disease)   . Actinic keratosis   . Kidney stones     Past Surgical History  Procedure Laterality Date  . Cholecystectomy  JUNE 2011 MJ    STONES, PANCREATITIS  . Back surgery    . Basal cell carcinoma excision  FACE  . Knee surgery  LEFT  . Lithotripsy  38s    Family History  Problem Relation Age of Onset  . Colon polyps Neg Hx   . Colon cancer Neg Hx     Social History History  Substance Use Topics  . Smoking status: Current Every Day Smoker -- 1.00 packs/day    Types: Cigarettes  . Smokeless tobacco: Not on file  . Alcohol Use: Yes     Comment: once a month    No Known Allergies  Current Outpatient Prescriptions  Medication Sig Dispense Refill  . aspirin 81 MG tablet Take 81 mg by mouth daily.      . metFORMIN (GLUCOPHAGE) 500 MG tablet Take 500 mg by mouth 2 (two) times daily with a meal.     . Multiple Vitamin (MULTIVITAMIN) tablet Take 1 tablet by mouth daily.    . naproxen (NAPROSYN) 500 MG tablet Take 500 mg by mouth 2 (two) times daily with a meal.    . rosuvastatin (CRESTOR) 10 MG tablet Take 10 mg by mouth daily.      Marland Kitchen telmisartan-hydrochlorothiazide (MICARDIS HCT) 40-12.5 MG per tablet Take 1 tablet by mouth daily.      . verapamil (COVERA HS) 240 MG (CO) 24 hr tablet Take 240 mg by mouth at bedtime.      . diclofenac (CATAFLAM) 50 MG tablet Take 1 tablet (50 mg total) by mouth 2 (two) times daily. 60 tablet 1   No current facility-administered medications for this visit.       Physical Exam Blood pressure 122/70, height '5\' 10"'$  (1.778 m), weight 220 lb (99.791 kg). Physical Exam The patient is well developed well nourished and well groomed. Orientation to person place and time is normal  Mood is pleasant. I do not see any ambulatory abnormalities  His right knee has medial joint line tenderness negative McMurray's knee flexion ARC 125 knee is stable motor exam is normal skin is intact sensations normal good dorsalis pedis pulses reflexes are  normal  Left knee stable  Left knee has some mild tenderness on the medial joint   Data Reviewed Bilateral knee x-rays show moderate arthritis nothing bone to bone at this point  Assessment The knee symptoms on the right are intermittent and appeared to be patellofemoral related recommend change medicine diclofenac 50 twice a day come back in 2 months Plan He should see Dr. Arnoldo Morale regarding his left leg numbness

## 2015-03-03 ENCOUNTER — Other Ambulatory Visit (HOSPITAL_COMMUNITY): Payer: Self-pay | Admitting: Orthopaedic Surgery

## 2015-03-03 ENCOUNTER — Encounter: Payer: Self-pay | Admitting: *Deleted

## 2015-03-03 ENCOUNTER — Telehealth: Payer: Self-pay | Admitting: *Deleted

## 2015-03-03 DIAGNOSIS — M25561 Pain in right knee: Secondary | ICD-10-CM

## 2015-03-03 NOTE — Telephone Encounter (Signed)
Patient walked into the office this morning wanting to be seen, states his knees are hurting worse than at his previous visit, medication is providing no relief and is also having some swelling in one of his knees, advised patient that Dr Aline Brochure is out of office for remainder of the week and no available appointments for at least a couple weeks, patient was advised that Dr Luna Glasgow is on call

## 2015-03-03 NOTE — Telephone Encounter (Signed)
This encounter was created in error - please disregard.

## 2015-03-03 NOTE — Telephone Encounter (Signed)
Documentation continued- patient was advised that Dr Luna Glasgow is on call for emergencies, Dr Brooke Bonito address and phone number was provided and patient was advised if he was unable to be seen to call us back for first available appointment.

## 2015-03-11 ENCOUNTER — Ambulatory Visit (HOSPITAL_COMMUNITY)
Admission: RE | Admit: 2015-03-11 | Discharge: 2015-03-11 | Disposition: A | Discharge status: Home / Self Care | Payer: Medicare Other | Source: Ambulatory Visit | Attending: Orthopaedic Surgery | Admitting: Orthopaedic Surgery

## 2015-03-11 DIAGNOSIS — M25561 Pain in right knee: Secondary | ICD-10-CM | POA: Insufficient documentation

## 2015-03-11 DIAGNOSIS — M1711 Unilateral primary osteoarthritis, right knee: Secondary | ICD-10-CM | POA: Insufficient documentation

## 2015-03-11 DIAGNOSIS — M23221 Derangement of posterior horn of medial meniscus due to old tear or injury, right knee: Secondary | ICD-10-CM | POA: Diagnosis not present

## 2015-03-18 ENCOUNTER — Telehealth: Payer: Self-pay | Admitting: Orthopedic Surgery

## 2015-03-18 ENCOUNTER — Ambulatory Visit (INDEPENDENT_AMBULATORY_CARE_PROVIDER_SITE_OTHER): Payer: Medicare Other | Admitting: Orthopedic Surgery

## 2015-03-18 VITALS — BP 118/75 | Ht 70.0 in | Wt 220.0 lb

## 2015-03-18 DIAGNOSIS — M23321 Other meniscus derangements, posterior horn of medial meniscus, right knee: Secondary | ICD-10-CM

## 2015-03-18 NOTE — Progress Notes (Signed)
Patient ID: [name], male   DOB: 1946/04/10, 69 y.o.   MRN: [id number]  Follow up visit  Chief Complaint  Patient presents with  . Follow-up    follow up right knee, review MRI    BP 118/75 mmHg  Ht '5\' 10"'$  (1.778 m)  Wt 220 lb (99.791 kg)  BMI 31.57 kg/m2  No diagnosis found.   the patient is back with an MRI of his right knee which shows he is torn medial meniscus and osteoarthritis. He says he wants his knee fixed and yesterday was not soon enough  He has medial joint line tenderness he has swelling like a confidence in the knee giving way symptoms  MRI was reviewed and agree has a torn meniscus and the osteoarthritis  We will do a simple meniscectomy. We expect him to recover fairly well in 4 weeks   I will do his history and physical at a later time/encorporated by reference

## 2015-03-19 NOTE — Telephone Encounter (Signed)
PATIENT AWARE

## 2015-03-19 NOTE — Patient Instructions (Signed)
Your procedure is scheduled on: 03/24/2015  Report to [facility] at  11:00   AM.  Call this number if you have problems the morning of surgery: [phone]   Remember:   Do not drink or eat food:After Midnight.  :  Take these medicines the morning of surgery with A SIP OF WATER: Verapamil   Do not wear jewelry, make-up or nail polish.  Do not wear lotions, powders, or perfumes. You may wear deodorant.  Do not shave 48 hours prior to surgery. Men may shave face and neck.  Do not bring valuables to the hospital.  Contacts, dentures or bridgework may not be worn into surgery.  Leave suitcase in the car. After surgery it may be brought to your room.  For patients admitted to the hospital, checkout time is 11:00 AM the day of discharge.   Patients discharged the day of surgery will not be allowed to drive home.    Special Instructions: Shower using CHG night before surgery and shower the day of surgery use CHG.  Use special wash - you have one bottle of CHG for all showers.  You should use approximately 1/2 of the bottle for each shower.   Please read over the following fact sheets that you were given: Pain Booklet, MRSA Information, Surgical Site Infection Prevention and Care and Recovery After Surgery  Arthroscopic Procedure, Knee, Care After Refer to this sheet in the next few weeks. These discharge instructions provide you with general information on caring for yourself after you leave the hospital. Your health care provider may also give you specific instructions. Your treatment has been planned according to the most current medical practices available, but unavoidable complications sometimes occur. If you have any problems or questions after discharge, please call your health care provider. HOME CARE INSTRUCTIONS   It is normal to be sore for a couple days after surgery. See your health care provider if this seems to be getting worse rather than better.  Only take over-the-counter or  prescription medicines for pain, discomfort, or fever as directed by your health care provider.  Take showers rather than baths, or as directed by your health care provider.  Change bandages (dressings) if necessary or as directed.  You may resume normal diet and activities as directed or allowed.  Avoid lifting and driving until you are directed otherwise.  Make an appointment to see your health care provider for stitches (suture) or staple removal as directed.  You may put ice on the area.  Put the ice in a plastic bag. Place a towel between your skin and the bag.  Leave the ice on for 15-20 minutes, three to four times per day for the first 2 days.  Elevate the knee above the level of your heart to reduce swelling, and avoid dangling the leg.  Do 10-15 ankle pumps (pointing your toes toward you and then away from you) two to three times daily.  If you are given compression stockings to wear after surgery, use them for as long as your surgeon tells you (around 10-14 days).  Avoid smoking and exposure to secondhand smoke. SEEK MEDICAL CARE IF:   You have increased bleeding from your wounds.  You see redness or swelling or you have increasing pain in your wounds.  You have pus coming from your wound.  You have a fever or persistent symptoms for more than 2-3 days.  You notice a bad smell coming from the wound or dressing.  You have  severe pain with any motion of your knee. SEEK IMMEDIATE MEDICAL CARE IF:   You develop a rash.  You have difficulty breathing.  You develop any reaction or side effects to medicines taken.  You develop pain in the calves or back of the knee.  You develop chest pain, shortness of breath, or difficulty breathing.  You develop numbness or tingling in the leg or foot. MAKE SURE YOU:   Understand these instructions.  Will watch your condition.  Will get help right away if you are not doing well or you get worse. Document Released:  02/10/2005 Document Revised: 07/29/2013 Document Reviewed: 12/19/2012 [facility] Patient Information 2015 [place], [place]. This information is not intended to replace advice given to you by your health care provider. Make sure you discuss any questions you have with your health care provider. General Anesthesia, Care After Refer to this sheet in the next few weeks. These instructions provide you with information on caring for yourself after your procedure. Your health care provider may also give you more specific instructions. Your treatment has been planned according to current medical practices, but problems sometimes occur. Call your health care provider if you have any problems or questions after your procedure. WHAT TO EXPECT AFTER THE PROCEDURE After the procedure, it is typical to experience:  Sleepiness.  Nausea and vomiting. HOME CARE INSTRUCTIONS  For the first 24 hours after general anesthesia:  Have a responsible person with you.  Do not drive a car. If you are alone, do not take public transportation.  Do not drink alcohol.  Do not take medicine that has not been prescribed by your health care provider.  Do not sign important papers or make important decisions.  You may resume a normal diet and activities as directed by your health care provider.  Change bandages (dressings) as directed.  If you have questions or problems that seem related to general anesthesia, call the hospital and ask for the anesthetist or anesthesiologist on call. SEEK MEDICAL CARE IF:  You have nausea and vomiting that continue the day after anesthesia.  You develop a rash. SEEK IMMEDIATE MEDICAL CARE IF:   You have difficulty breathing.  You have chest pain.  You have any allergic problems. Document Released: 10/30/2000 Document Revised: 07/29/2013 Document Reviewed: 02/06/2013 [facility] Patient Information 2015 [place], [place]. This information is not intended to replace advice given to you  by your health care provider. Make sure you discuss any questions you have with your health care provider.

## 2015-03-22 ENCOUNTER — Other Ambulatory Visit: Payer: Self-pay

## 2015-03-22 ENCOUNTER — Encounter (HOSPITAL_COMMUNITY): Payer: Self-pay

## 2015-03-22 ENCOUNTER — Encounter (HOSPITAL_COMMUNITY)
Admission: RE | Admit: 2015-03-22 | Discharge: 2015-03-22 | Disposition: A | Discharge status: Home / Self Care | Payer: Medicare Other | Source: Ambulatory Visit | Attending: Orthopedic Surgery | Admitting: Orthopedic Surgery

## 2015-03-22 DIAGNOSIS — Z79899 Other long term (current) drug therapy: Secondary | ICD-10-CM | POA: Diagnosis not present

## 2015-03-22 DIAGNOSIS — F17210 Nicotine dependence, cigarettes, uncomplicated: Secondary | ICD-10-CM | POA: Diagnosis not present

## 2015-03-22 DIAGNOSIS — J449 Chronic obstructive pulmonary disease, unspecified: Secondary | ICD-10-CM | POA: Diagnosis not present

## 2015-03-22 DIAGNOSIS — S83241A Other tear of medial meniscus, current injury, right knee, initial encounter: Secondary | ICD-10-CM | POA: Diagnosis present

## 2015-03-22 DIAGNOSIS — I10 Essential (primary) hypertension: Secondary | ICD-10-CM | POA: Diagnosis not present

## 2015-03-22 DIAGNOSIS — E785 Hyperlipidemia, unspecified: Secondary | ICD-10-CM | POA: Diagnosis not present

## 2015-03-22 DIAGNOSIS — E119 Type 2 diabetes mellitus without complications: Secondary | ICD-10-CM | POA: Diagnosis not present

## 2015-03-22 DIAGNOSIS — Z7982 Long term (current) use of aspirin: Secondary | ICD-10-CM | POA: Diagnosis not present

## 2015-03-22 DIAGNOSIS — M1990 Unspecified osteoarthritis, unspecified site: Secondary | ICD-10-CM | POA: Diagnosis not present

## 2015-03-22 DIAGNOSIS — Z791 Long term (current) use of non-steroidal anti-inflammatories (NSAID): Secondary | ICD-10-CM | POA: Diagnosis not present

## 2015-03-22 HISTORY — DX: Personal history of urinary calculi: Z87.442

## 2015-03-22 HISTORY — DX: Type 2 diabetes mellitus without complications: E11.9

## 2015-03-22 LAB — BASIC METABOLIC PANEL
Anion gap: 10 (ref 5–15)
BUN: 18 mg/dL (ref 6–20)
CALCIUM: 9.3 mg/dL (ref 8.9–10.3)
CO2: 27 mmol/L (ref 22–32)
CREATININE: 0.89 mg/dL (ref 0.61–1.24)
Chloride: 103 mmol/L (ref 101–111)
GFR calc non Af Amer: >60 mL/min (ref >60)
Glucose, Bld: 166 mg/dL — ABNORMAL HIGH (ref 65–99)
Potassium: 3.9 mmol/L (ref 3.5–5.1)
SODIUM: 140 mmol/L (ref 135–145)

## 2015-03-22 LAB — CBC
HCT: 45.3 % (ref 39.0–52.0)
HEMOGLOBIN: 15.4 g/dL (ref 13.0–17.0)
MCH: 33 pg (ref 26.0–34.0)
MCHC: 34 g/dL (ref 30.0–36.0)
MCV: 97.2 fL (ref 78.0–100.0)
Platelets: 242 10*3/uL (ref 150–400)
RBC: 4.66 MIL/uL (ref 4.22–5.81)
RDW: 13.6 % (ref 11.5–15.5)
WBC: 10.8 10*3/uL — ABNORMAL HIGH (ref 4.0–10.5)

## 2015-03-23 NOTE — H&P (Signed)
New    Chief Complaint   Patient presents with   .  Knee Pain       bilateral knee pain x 1 month, no known injury, ref [place]el[place] Abajo is a 69 y.o. male.   HPI 69 year old male presented with a one-month history of knee pain involving the right knee which started when he was getting out of the os going to his car.  Ultrasound was obtained it was negative. He continued to complain of catching and giving way in the right knee. He eventually had an MRI which showed he had a torn meniscus with osteoarthritis and he presents for arthroscopic surgery  Left sided numbness is constant right-sided anteromedial knee pain is intermittent with weightbearing and not relieved by naproxen. He's been on naproxen for 10 years  Joint pain stiffness joints numbness tingling G UGI normal no fevers constitutional symptoms negative Review of Systems See hpi    Past Medical History   Diagnosis  Date   .  Hypertension     .  Colitis  MAY 2012        CT ABD/PELVIS HEP FLEXURE   .  Hyperlipemia     .  Arthritis     .  NSAID long-term use  NAPROXEN FOR OA   .  COPD (chronic obstructive pulmonary disease)     .  Actinic keratosis     .  Kidney stones         Past Surgical History   Procedure  Laterality  Date   .  Cholecystectomy    JUNE 2011 MJ       STONES, PANCREATITIS   .  Back surgery       .  Basal cell carcinoma excision    FACE   .  Knee surgery    LEFT   .  Lithotripsy    43s       Family History   Problem  Relation  Age of Onset   .  Colon polyps  Neg Hx     .  Colon cancer  Neg Hx       Social History History   Substance Use Topics   .  Smoking status:  Current Every Day Smoker -- 1.00 packs/day       Types:  Cigarettes   .  Smokeless tobacco:  Not on file   .  Alcohol Use:  Yes         Comment: once a month     No Known Allergies    Current Outpatient Prescriptions   Medication  Sig  Dispense  Refill   .  aspirin 81 MG tablet  Take 81 mg by mouth daily.          .  metFORMIN (GLUCOPHAGE) 500 MG tablet  Take 500 mg by mouth 2 (two) times daily with a meal.       .  Multiple Vitamin (MULTIVITAMIN) tablet  Take 1 tablet by mouth daily.       .  naproxen (NAPROSYN) 500 MG tablet  Take 500 mg by mouth 2 (two) times daily with a meal.       .  rosuvastatin (CRESTOR) 10 MG tablet  Take 10 mg by mouth daily.         Marland Kitchen  telmisartan-hydrochlorothiazide (MICARDIS HCT) 40-12.5 MG per tablet  Take 1 tablet by mouth daily.         .  verapamil (COVERA  HS) 240 MG (CO) 24 hr tablet  Take 240 mg by mouth at bedtime.         .  diclofenac (CATAFLAM) 50 MG tablet  Take 1 tablet (50 mg total) by mouth 2 (two) times daily.  60 tablet  1      No current facility-administered medications for this visit.        Physical Exam Blood pressure 122/70, height '5\' 10"'$  (1.778 m), weight 220 lb (99.791 kg). Physical Exam The patient is well developed well nourished and well groomed. Orientation to person place and time is normal   Mood is pleasant. I do not see any ambulatory abnormalities Upper extremities are normal His right knee has medial joint line tenderness negative McMurray's knee flexion ARC 125 knee is stable motor exam is normal skin is intact sensations normal good dorsalis pedis pulses reflexes are normal  Left knee stable  Left knee has some mild tenderness on the medial joint  MRI was reviewed and agree has a torn meniscus and the osteoarthritis right knee  We will do a simple meniscectomy. Of the right knee  We expect him to recover fairly well in 4 weeks

## 2015-03-24 ENCOUNTER — Encounter (HOSPITAL_COMMUNITY): Payer: Self-pay | Admitting: *Deleted

## 2015-03-24 ENCOUNTER — Ambulatory Visit (HOSPITAL_COMMUNITY): Payer: Medicare Other | Admitting: Anesthesiology

## 2015-03-24 ENCOUNTER — Encounter (HOSPITAL_COMMUNITY)
Admission: RE | Disposition: A | Discharge status: Home / Self Care | Payer: Self-pay | Source: Ambulatory Visit | Attending: Orthopedic Surgery

## 2015-03-24 ENCOUNTER — Ambulatory Visit (HOSPITAL_COMMUNITY)
Admission: RE | Admit: 2015-03-24 | Discharge: 2015-03-24 | Disposition: A | Discharge status: Home / Self Care | Payer: Medicare Other | Source: Ambulatory Visit | Attending: Orthopedic Surgery | Admitting: Orthopedic Surgery

## 2015-03-24 ENCOUNTER — Telehealth: Payer: Self-pay | Admitting: Orthopedic Surgery

## 2015-03-24 DIAGNOSIS — Z7982 Long term (current) use of aspirin: Secondary | ICD-10-CM | POA: Insufficient documentation

## 2015-03-24 DIAGNOSIS — M6750 Plica syndrome, unspecified knee: Secondary | ICD-10-CM | POA: Insufficient documentation

## 2015-03-24 DIAGNOSIS — M1711 Unilateral primary osteoarthritis, right knee: Secondary | ICD-10-CM

## 2015-03-24 DIAGNOSIS — M1710 Unilateral primary osteoarthritis, unspecified knee: Secondary | ICD-10-CM | POA: Insufficient documentation

## 2015-03-24 DIAGNOSIS — M6751 Plica syndrome, right knee: Secondary | ICD-10-CM

## 2015-03-24 DIAGNOSIS — I10 Essential (primary) hypertension: Secondary | ICD-10-CM | POA: Insufficient documentation

## 2015-03-24 DIAGNOSIS — S83242A Other tear of medial meniscus, current injury, left knee, initial encounter: Secondary | ICD-10-CM

## 2015-03-24 DIAGNOSIS — E785 Hyperlipidemia, unspecified: Secondary | ICD-10-CM | POA: Insufficient documentation

## 2015-03-24 DIAGNOSIS — M1990 Unspecified osteoarthritis, unspecified site: Secondary | ICD-10-CM | POA: Insufficient documentation

## 2015-03-24 DIAGNOSIS — F17210 Nicotine dependence, cigarettes, uncomplicated: Secondary | ICD-10-CM | POA: Insufficient documentation

## 2015-03-24 DIAGNOSIS — Z791 Long term (current) use of non-steroidal anti-inflammatories (NSAID): Secondary | ICD-10-CM | POA: Insufficient documentation

## 2015-03-24 DIAGNOSIS — E119 Type 2 diabetes mellitus without complications: Secondary | ICD-10-CM | POA: Insufficient documentation

## 2015-03-24 DIAGNOSIS — S83241A Other tear of medial meniscus, current injury, right knee, initial encounter: Secondary | ICD-10-CM | POA: Diagnosis not present

## 2015-03-24 DIAGNOSIS — S83249A Other tear of medial meniscus, current injury, unspecified knee, initial encounter: Secondary | ICD-10-CM | POA: Insufficient documentation

## 2015-03-24 DIAGNOSIS — Z79899 Other long term (current) drug therapy: Secondary | ICD-10-CM | POA: Insufficient documentation

## 2015-03-24 DIAGNOSIS — J449 Chronic obstructive pulmonary disease, unspecified: Secondary | ICD-10-CM | POA: Insufficient documentation

## 2015-03-24 HISTORY — PX: KNEE ARTHROSCOPY WITH MEDIAL MENISECTOMY: SHX5651

## 2015-03-24 LAB — GLUCOSE, CAPILLARY
GLUCOSE-CAPILLARY: 98 mg/dL (ref 65–99)
Glucose-Capillary: 89 mg/dL (ref 65–99)

## 2015-03-24 SURGERY — ARTHROSCOPY, KNEE, WITH MEDIAL MENISCECTOMY
Anesthesia: General | Site: Knee | Laterality: Right

## 2015-03-24 MED ORDER — CHLORHEXIDINE GLUCONATE 4 % EX LIQD: 60.0000 mL | Freq: Once | CUTANEOUS | Status: DC

## 2015-03-24 MED ORDER — ONDANSETRON HCL 4 MG/2ML IJ SOLN: 4.0000 mg | Freq: Once | INTRAMUSCULAR | Status: DC | PRN

## 2015-03-24 MED ORDER — BUPIVACAINE-EPINEPHRINE (PF) 0.5% -1:200000 IJ SOLN
INTRAMUSCULAR | Status: DC | PRN
  Administered 2015-03-24: 60 mL via PERINEURAL

## 2015-03-24 MED ORDER — DIPHENHYDRAMINE HCL 50 MG/ML IJ SOLN
INTRAMUSCULAR | Status: DC | PRN
  Administered 2015-03-24: 50 mg via INTRAVENOUS

## 2015-03-24 MED ORDER — EPINEPHRINE HCL 1 MG/ML IJ SOLN
INTRAMUSCULAR | Status: AC
  Filled 2015-03-24: qty 5

## 2015-03-24 MED ORDER — GLYCOPYRROLATE 0.2 MG/ML IJ SOLN
0.2000 mg | Freq: Once | INTRAMUSCULAR | Status: AC
  Administered 2015-03-24: 0.2 mg via INTRAVENOUS

## 2015-03-24 MED ORDER — KETOROLAC TROMETHAMINE 30 MG/ML IJ SOLN
30.0000 mg | Freq: Once | INTRAMUSCULAR | Status: AC
  Administered 2015-03-24: 30 mg via INTRAVENOUS
  Filled 2015-03-24: qty 1

## 2015-03-24 MED ORDER — ONDANSETRON HCL 4 MG/2ML IJ SOLN
INTRAMUSCULAR | Status: AC
  Filled 2015-03-24: qty 2

## 2015-03-24 MED ORDER — MIDAZOLAM HCL 2 MG/2ML IJ SOLN
INTRAMUSCULAR | Status: AC
  Filled 2015-03-24: qty 4

## 2015-03-24 MED ORDER — ONDANSETRON HCL 4 MG/2ML IJ SOLN
4.0000 mg | Freq: Once | INTRAMUSCULAR | Status: AC
  Administered 2015-03-24: 4 mg via INTRAVENOUS

## 2015-03-24 MED ORDER — MIDAZOLAM HCL 2 MG/2ML IJ SOLN
1.0000 mg | INTRAMUSCULAR | Status: DC | PRN
  Administered 2015-03-24: 2 mg via INTRAVENOUS

## 2015-03-24 MED ORDER — GLYCOPYRROLATE 0.2 MG/ML IJ SOLN
INTRAMUSCULAR | Status: AC
  Filled 2015-03-24: qty 1

## 2015-03-24 MED ORDER — PROMETHAZINE HCL 12.5 MG PO TABS: 12.5000 mg | ORAL_TABLET | Freq: Four times a day (QID) | ORAL | Status: DC | PRN

## 2015-03-24 MED ORDER — MIDAZOLAM HCL 2 MG/2ML IJ SOLN
INTRAMUSCULAR | Status: AC
  Filled 2015-03-24: qty 2

## 2015-03-24 MED ORDER — HYDROCODONE-ACETAMINOPHEN 5-325 MG PO TABS
2.0000 | ORAL_TABLET | Freq: Once | ORAL | Status: AC
  Administered 2015-03-24: 2 via ORAL
  Filled 2015-03-24: qty 2

## 2015-03-24 MED ORDER — FENTANYL CITRATE (PF) 100 MCG/2ML IJ SOLN
25.0000 ug | INTRAMUSCULAR | Status: AC
  Filled 2015-03-24: qty 2

## 2015-03-24 MED ORDER — BUPIVACAINE-EPINEPHRINE (PF) 0.5% -1:200000 IJ SOLN
INTRAMUSCULAR | Status: AC
Start: 2015-03-24 — End: 2015-03-24
  Filled 2015-03-24: qty 60

## 2015-03-24 MED ORDER — FENTANYL CITRATE (PF) 100 MCG/2ML IJ SOLN: 25.0000 ug | INTRAMUSCULAR | Status: DC | PRN

## 2015-03-24 MED ORDER — ONDANSETRON HCL 4 MG/2ML IJ SOLN
4.0000 mg | Freq: Once | INTRAMUSCULAR | Status: AC
  Administered 2015-03-24: 4 mg via INTRAVENOUS
  Filled 2015-03-24: qty 2

## 2015-03-24 MED ORDER — PROPOFOL 10 MG/ML IV BOLUS
INTRAVENOUS | Status: AC
Start: 2015-03-24 — End: 2015-03-24
  Filled 2015-03-24: qty 20

## 2015-03-24 MED ORDER — FENTANYL CITRATE (PF) 100 MCG/2ML IJ SOLN
INTRAMUSCULAR | Status: AC
  Filled 2015-03-24: qty 2

## 2015-03-24 MED ORDER — HYDROCODONE-ACETAMINOPHEN 7.5-325 MG PO TABS: 1.0000 | ORAL_TABLET | ORAL | Status: DC | PRN

## 2015-03-24 MED ORDER — PROPOFOL 10 MG/ML IV BOLUS
INTRAVENOUS | Status: DC | PRN
  Administered 2015-03-24: 20 mg via INTRAVENOUS
  Administered 2015-03-24: 130 mg via INTRAVENOUS

## 2015-03-24 MED ORDER — FENTANYL CITRATE (PF) 100 MCG/2ML IJ SOLN
25.0000 ug | INTRAMUSCULAR | Status: AC
  Administered 2015-03-24 (×2): 25 ug via INTRAVENOUS

## 2015-03-24 MED ORDER — LACTATED RINGERS IV SOLN
INTRAVENOUS | Status: DC
  Administered 2015-03-24: 1000 mL via INTRAVENOUS

## 2015-03-24 MED ORDER — CEFAZOLIN SODIUM-DEXTROSE 2-3 GM-% IV SOLR
2.0000 g | INTRAVENOUS | Status: AC
  Administered 2015-03-24: 2 g via INTRAVENOUS
  Filled 2015-03-24: qty 50

## 2015-03-24 MED ORDER — SODIUM CHLORIDE 0.9 % IR SOLN
Status: DC | PRN
  Administered 2015-03-24 (×4): 3000 mL

## 2015-03-24 MED ORDER — LIDOCAINE HCL 1 % IJ SOLN
INTRAMUSCULAR | Status: DC | PRN
  Administered 2015-03-24: 30 mg via INTRADERMAL

## 2015-03-24 MED ORDER — EPINEPHRINE HCL 1 MG/ML IJ SOLN
INTRAMUSCULAR | Status: AC
  Filled 2015-03-24: qty 4

## 2015-03-24 MED ORDER — FENTANYL CITRATE (PF) 100 MCG/2ML IJ SOLN
INTRAMUSCULAR | Status: DC | PRN
  Administered 2015-03-24: 50 ug via INTRAVENOUS
  Administered 2015-03-24 (×2): 25 ug via INTRAVENOUS

## 2015-03-24 MED ORDER — MIDAZOLAM HCL 5 MG/5ML IJ SOLN
INTRAMUSCULAR | Status: DC | PRN
  Administered 2015-03-24: 2 mg via INTRAVENOUS

## 2015-03-24 MED ORDER — SODIUM CHLORIDE 0.9 % IR SOLN
Status: DC | PRN
  Administered 2015-03-24: 1000 mL

## 2015-03-24 SURGICAL SUPPLY — 56 items
ARTHROWAND PARAGON T2 (SURGICAL WAND)
BAG HAMPER (MISCELLANEOUS) ×2 IMPLANT
BANDAGE ELASTIC 6 VELCRO NS (GAUZE/BANDAGES/DRESSINGS) ×2 IMPLANT
BLADE AGGRESSIVE PLUS 4.0 (BLADE) ×2 IMPLANT
CHLORAPREP W/TINT 26ML (MISCELLANEOUS) ×4 IMPLANT
CLOTH BEACON ORANGE TIMEOUT ST (SAFETY) ×2 IMPLANT
COOLER CRYO IC GRAV AND TUBE (ORTHOPEDIC SUPPLIES) ×2 IMPLANT
CUFF CRYO KNEE LG 20X31 COOLER (ORTHOPEDIC SUPPLIES) ×1 IMPLANT
CUFF CRYO KNEE18X23 MED (MISCELLANEOUS) IMPLANT
CUFF TOURNIQUET SINGLE 34IN LL (TOURNIQUET CUFF) ×1 IMPLANT
CUFF TOURNIQUET SINGLE 44IN (TOURNIQUET CUFF) IMPLANT
CUTTER ANGLED DBL BITE 4.5 (BURR) IMPLANT
DECANTER SPIKE VIAL GLASS SM (MISCELLANEOUS) ×4 IMPLANT
GAUZE SPONGE 4X4 12PLY STRL (GAUZE/BANDAGES/DRESSINGS) ×2 IMPLANT
GAUZE SPONGE 4X4 16PLY XRAY LF (GAUZE/BANDAGES/DRESSINGS) ×2 IMPLANT
GAUZE XEROFORM 5X9 LF (GAUZE/BANDAGES/DRESSINGS) ×2 IMPLANT
GLOVE BIOGEL PI IND STRL 7.0 (GLOVE) IMPLANT
GLOVE BIOGEL PI INDICATOR 7.0 (GLOVE) ×1
GLOVE ECLIPSE 6.5 STRL STRAW (GLOVE) ×1 IMPLANT
GLOVE EXAM NITRILE MD LF STRL (GLOVE) ×1 IMPLANT
GLOVE SKINSENSE NS SZ8.0 LF (GLOVE) ×1
GLOVE SKINSENSE STRL SZ8.0 LF (GLOVE) ×1 IMPLANT
GLOVE SS N UNI LF 8.5 STRL (GLOVE) ×2 IMPLANT
GOWN STRL REUS W/ TWL LRG LVL3 (GOWN DISPOSABLE) ×1 IMPLANT
GOWN STRL REUS W/TWL LRG LVL3 (GOWN DISPOSABLE) ×2
GOWN STRL REUS W/TWL XL LVL3 (GOWN DISPOSABLE) ×2 IMPLANT
HLDR LEG FOAM (MISCELLANEOUS) ×1 IMPLANT
IV NS IRRIG 3000ML ARTHROMATIC (IV SOLUTION) ×6 IMPLANT
KIT BLADEGUARD II DBL (SET/KITS/TRAYS/PACK) ×2 IMPLANT
KIT ROOM TURNOVER AP CYSTO (KITS) ×2 IMPLANT
LEG HOLDER FOAM (MISCELLANEOUS) ×1
MANIFOLD NEPTUNE II (INSTRUMENTS) ×2 IMPLANT
MARKER SKIN DUAL TIP RULER LAB (MISCELLANEOUS) ×2 IMPLANT
NDL HYPO 18GX1.5 BLUNT FILL (NEEDLE) ×1 IMPLANT
NDL HYPO 21X1.5 SAFETY (NEEDLE) ×1 IMPLANT
NDL SPNL 18GX3.5 QUINCKE PK (NEEDLE) ×1 IMPLANT
NEEDLE HYPO 18GX1.5 BLUNT FILL (NEEDLE) ×2 IMPLANT
NEEDLE HYPO 21X1.5 SAFETY (NEEDLE) ×2 IMPLANT
NEEDLE SPNL 18GX3.5 QUINCKE PK (NEEDLE) ×2 IMPLANT
NS IRRIG 1000ML POUR BTL (IV SOLUTION) ×2 IMPLANT
PACK ARTHRO LIMB DRAPE STRL (MISCELLANEOUS) ×2 IMPLANT
PAD ABD 5X9 TENDERSORB (GAUZE/BANDAGES/DRESSINGS) ×2 IMPLANT
PAD ARMBOARD 7.5X6 YLW CONV (MISCELLANEOUS) ×2 IMPLANT
PADDING CAST COTTON 6X4 STRL (CAST SUPPLIES) ×2 IMPLANT
PADDING WEBRIL 6 STERILE (GAUZE/BANDAGES/DRESSINGS) ×1 IMPLANT
SET ARTHROSCOPY INST (INSTRUMENTS) ×2 IMPLANT
SET ARTHROSCOPY PUMP TUBE (IRRIGATION / IRRIGATOR) ×2 IMPLANT
SET BASIN LINEN APH (SET/KITS/TRAYS/PACK) ×2 IMPLANT
SPONGE GAUZE 4X4 12PLY (GAUZE/BANDAGES/DRESSINGS) ×1 IMPLANT
SUT ETHILON 3 0 FSL (SUTURE) IMPLANT
SYR 30ML LL (SYRINGE) ×2 IMPLANT
SYRINGE 10CC LL (SYRINGE) ×2 IMPLANT
WAND 50 DEG COVAC W/CORD (SURGICAL WAND) ×1 IMPLANT
WAND 90 DEG TURBOVAC W/CORD (SURGICAL WAND) IMPLANT
WAND ARTHRO PARAGON T2 (SURGICAL WAND) IMPLANT
YANKAUER SUCT BULB TIP 10FT TU (MISCELLANEOUS) ×6 IMPLANT

## 2015-03-24 NOTE — Transfer of Care (Signed)
Immediate Anesthesia Transfer of Care Note  Patient: [name]  Procedure(s) Performed: Procedure(s): KNEE ARTHROSCOPY WITH MEDIAL MENISECTOMY (Right)  Patient Location: PACU  Anesthesia Type:General  Level of Consciousness: sedated and patient cooperative  Airway & Oxygen Therapy: Patient Spontanous Breathing and Patient connected to face mask oxygen  Post-op Assessment: Report given to RN and Post -op Vital signs reviewed and stable  Post vital signs: Reviewed and stable  Last Vitals:  Filed Vitals:   03/24/15 1325  BP: 109/77  Pulse:   Temp:   Resp: 17    Complications: No apparent anesthesia complications

## 2015-03-24 NOTE — Anesthesia Preprocedure Evaluation (Signed)
Anesthesia Evaluation  Patient identified by MRN, date of birth, ID band Patient awake    Reviewed: Allergy & Precautions, NPO status , Patient's Chart, lab work & pertinent test results  Airway Mallampati: II  TM Distance: >3 FB     Dental  (+) Teeth Intact, Dental Advisory Given, Caps   Pulmonary COPDCurrent Smoker,  breath sounds clear to auscultation        Cardiovascular hypertension, Pt. on medications Rhythm:Regular Rate:Normal     Neuro/Psych    GI/Hepatic   Endo/Other  diabetes, Well Controlled, Type 2, Oral Hypoglycemic Agents  Renal/GU      Musculoskeletal  (+) Arthritis -,   Abdominal   Peds  Hematology   Anesthesia Other Findings   Reproductive/Obstetrics                             Anesthesia Physical Anesthesia Plan  ASA: III  Anesthesia Plan: General   Post-op Pain Management:    Induction: Intravenous  Airway Management Planned: LMA  Additional Equipment:   Intra-op Plan:   Post-operative Plan: Extubation in OR  Informed Consent: I have reviewed the patients History and Physical, chart, labs and discussed the procedure including the risks, benefits and alternatives for the proposed anesthesia with the patient or authorized representative who has indicated his/her understanding and acceptance.     Plan Discussed with:   Anesthesia Plan Comments:         Anesthesia Quick Evaluation

## 2015-03-24 NOTE — Op Note (Signed)
Operative procedure 03/24/2015  Arthroscopy right knee partial medial meniscectomy, removal of plica and chondroplasty  This patient had knee pain we treated him nonoperatively he did not improve. We got an MRI of his knee showed a torn meniscus  His knee was not improving and he opted for surgical intervention  Preop diagnosis torn medial meniscus right knee  Postop diagnosis torn medial meniscus right knee, plica right knee, osteoarthritis right knee.  Surgeon Aline Brochure no assistants  Gen. anesthesia  Operative findings The medial meniscus is torn at the root. There was a grade 2 lesion thickness wise, and a grade 3 lesion by surface area of the medial femoral condyle. Medial plica was also noted. Significant amount of loose cartilage particles in the joint  Grade 1 lesion and grade 2 lesion trochlea and patella respectively  After site marking and chart update and consent signing patient was taken to surgery for general anesthesia. After general anesthesia right leg was prepped and draped sterilely. Timeout was completed.  Portal was placed laterally scope was placed in the joint. We noticed a significant amount of loose cartilaginous particles. We made a medial portal. A suction device and a sucked his many of these particles out as possible. We then turned our attention to the rest of the joint we did a diagnostic arthroscopy. Lateral compartment was normal. Notch was normal. Medial compartment as noted above  through the medial portal we resected the meniscus with a shaver at the root removing the torn tissue and then balanced the meniscus using a 50 ArthroCare wand. We used a suction device to remove the plica and we did a chondroplasty of the medial femoral condyle to smooth out the surface.  The knee was irrigated several times and suctioned several times to remove any other cartilage fragments. Once this was successfully accomplished the joint was injected with 60 mL of Marcaine  and 3-0 nylon was used to close the portals  Cryo/Cuff was placed after sterile dressings. Cryo/Cuff was activated patient was extubated taken recovery room in stable condition

## 2015-03-24 NOTE — Anesthesia Procedure Notes (Signed)
Procedure Name: LMA Insertion Date/Time: 03/24/2015 1:38 PM Performed by: Charmaine Downs Pre-anesthesia Checklist: Patient identified, Emergency Drugs available, Suction available and Patient being monitored Patient Re-evaluated:Patient Re-evaluated prior to inductionOxygen Delivery Method: Circle system utilized Preoxygenation: Pre-oxygenation with 100% oxygen Intubation Type: IV induction Ventilation: Mask ventilation without difficulty LMA: LMA inserted LMA Size: 5.0 Grade View: Grade II Number of attempts: 1 Placement Confirmation: breath sounds checked- equal and bilateral and positive ETCO2 Tube secured with: Tape Dental Injury: Teeth and Oropharynx as per pre-operative assessment

## 2015-03-24 NOTE — Telephone Encounter (Signed)
Regarding out-patient procedures, (scheduled surgery 03/24/15, right knee arthroscopy - no pre-authorization required per Medicare guidelines and per Tric[employer](for Life) secondary, as Medicare guidelines apply; spoke with Ples Specter, phone # [phone]

## 2015-03-24 NOTE — Brief Op Note (Signed)
03/24/2015  2:17 PM  PATIENT:  [name]  69 y.o. male  PRE-OPERATIVE DIAGNOSIS:  right knee medial meniscus tear  POST-OPERATIVE DIAGNOSIS:  right knee medial meniscus tear  PROCEDURE:  Procedure(s): KNEE ARTHROSCOPY WITH MEDIAL MENISECTOMY (Right)   Surgical findings. The lateral compartment including meniscus was normal. Anterior cruciate ligament and PCL Notch area normal. The patellofemoral joint had mild chondromalacia trochlear and patellar areas.  Medial femoral condyle had a grade 2 thickness lesion with grade 3 surface area arthritic lesion and a posterior horn root tear of the medial meniscus  SURGEON:  Surgeon(s) and Role:    * Carole Civil, MD - Primary  PHYSICIAN ASSISTANT:   ASSISTANTS: none   ANESTHESIA:   general  EBL:  Total I/O In: 700 [I.V.:700] Out: -   BLOOD ADMINISTERED:none  DRAINS: none   LOCAL MEDICATIONS USED:  MARCAINE     SPECIMEN:  No Specimen  DISPOSITION OF SPECIMEN:  N/A  COUNTS:  YES  TOURNIQUET:    DICTATION: .Dragon Dictation  PLAN OF CARE: Discharge to home after PACU  PATIENT DISPOSITION:  PACU - hemodynamically stable.   Delay start of Pharmacological VTE agent (>24hrs) due to surgical blood loss or risk of bleeding: not applicable

## 2015-03-24 NOTE — H&P (View-Only) (Signed)
Patient ID: [name], male   DOB: Dec 11, 1945, 69 y.o.   MRN: [id number]  Follow up visit  Chief Complaint  Patient presents with  . Follow-up    follow up right knee, review MRI    BP 118/75 mmHg  Ht '5\' 10"'$  (1.778 m)  Wt 220 lb (99.791 kg)  BMI 31.57 kg/m2  No diagnosis found.   the patient is back with an MRI of his right knee which shows he is torn medial meniscus and osteoarthritis. He says he wants his knee fixed and yesterday was not soon enough  He has medial joint line tenderness he has swelling like a confidence in the knee giving way symptoms  MRI was reviewed and agree has a torn meniscus and the osteoarthritis  We will do a simple meniscectomy. We expect him to recover fairly well in 4 weeks   I will do his history and physical at a later time/encorporated by reference

## 2015-03-24 NOTE — Anesthesia Postprocedure Evaluation (Addendum)
  Anesthesia Post-op Note  Patient: [name]  Procedure(s) Performed: Procedure(s): KNEE ARTHROSCOPY WITH MEDIAL MENISECTOMY (Right)  Patient Location: PACU  Anesthesia Type:General  Level of Consciousness: Awake and oriented  Airway and Oxygen Therapy: Patient Spontanous Breathing  Post-op Pain: none  Post-op Assessment: Post-op Vital signs reviewed, Patient's Cardiovascular Status Stable, Respiratory Function Stable, Patent Airway, No signs of Nausea or vomiting and Pain level controlled              Post-op Vital Signs: Reviewed and stable  Last Vitals:  Filed Vitals:   03/24/15 1440  BP: 122/74  Pulse:   Temp:   Resp:     Complications: No apparent anesthesia complications

## 2015-03-24 NOTE — Interval H&P Note (Signed)
History and Physical Interval Note:  03/24/2015 1:16 PM  [name]  has presented today for surgery, with the diagnosis of right knee medial meniscus tear  The various methods of treatment have been discussed with the patient and family. After consideration of risks, benefits and other options for treatment, the patient has consented to  Procedure(s): KNEE ARTHROSCOPY WITH MEDIAL MENISECTOMY (Right) as a surgical intervention .  The patient's history has been reviewed, patient examined, no change in status, stable for surgery.  I have reviewed the patient's chart and labs.  Questions were answered to the patient's satisfaction.     Arther Abbott

## 2015-03-24 NOTE — Discharge Instructions (Signed)
You can weight-bear as tolerated on your operative leg use crutches as needed  No driving for 1 week  Change dressing tomorrow apply 2 Band-Aids  On Friday start bending your knee 253 times a day  Use the blue pad on your knee has an ice cuff. Take it off when you're walking. Apply the cuff for one hour every 2 hours while you are awake  We found arthritis in your knee as well as a torn meniscus and a plica and multiple loose cartilaginous fragments  We removed the torn piece of meniscal tissue. We removed the plica. We removed the fragments of cartilage. And we shaved the abnormal arthritic cartilage.  Arthroscopic Procedure, Knee, Care After Refer to this sheet in the next few weeks. These discharge instructions provide you with general information on caring for yourself after you leave the hospital. Your health care provider may also give you specific instructions. Your treatment has been planned according to the most current medical practices available, but unavoidable complications sometimes occur. If you have any problems or questions after discharge, please call your health care provider. HOME CARE INSTRUCTIONS   It is normal to be sore for a couple days after surgery. See your health care provider if this seems to be getting worse rather than better.  Only take over-the-counter or prescription medicines for pain, discomfort, or fever as directed by your health care provider.  Take showers rather than baths, or as directed by your health care provider.  Change bandages (dressings) if necessary or as directed.  You may resume normal diet and activities as directed or allowed.  Avoid lifting and driving until you are directed otherwise.  Make an appointment to see your health care provider for stitches (suture) or staple removal as directed.  You may put ice on the area.  Put the ice in a plastic bag. Place a towel between your skin and the bag.  Leave the ice on for 15-20  minutes, three to four times per day for the first 2 days.  Elevate the knee above the level of your heart to reduce swelling, and avoid dangling the leg.  Do 10-15 ankle pumps (pointing your toes toward you and then away from you) two to three times daily.  If you are given compression stockings to wear after surgery, use them for as long as your surgeon tells you (around 10-14 days).  Avoid smoking and exposure to secondhand smoke. SEEK MEDICAL CARE IF:   You have increased bleeding from your wounds.  You see redness or swelling or you have increasing pain in your wounds.  You have pus coming from your wound.  You have a fever or persistent symptoms for more than 2-3 days.  You notice a bad smell coming from the wound or dressing.  You have severe pain with any motion of your knee. SEEK IMMEDIATE MEDICAL CARE IF:   You develop a rash.  You have difficulty breathing.  You develop any reaction or side effects to medicines taken.  You develop pain in the calves or back of the knee.  You develop chest pain, shortness of breath, or difficulty breathing.  You develop numbness or tingling in the leg or foot. MAKE SURE YOU:   Understand these instructions.  Will watch your condition.  Will get help right away if you are not doing well or you get worse. Document Released: 02/10/2005 Document Revised: 07/29/2013 Document Reviewed: 12/19/2012 [facility] Patient Information 2015 [place], [place]. This information is not intended  to replace advice given to you by your health care provider. Make sure you discuss any questions you have with your health care provider.

## 2015-03-25 ENCOUNTER — Encounter (HOSPITAL_COMMUNITY): Payer: Self-pay | Admitting: Orthopedic Surgery

## 2015-03-29 ENCOUNTER — Ambulatory Visit (INDEPENDENT_AMBULATORY_CARE_PROVIDER_SITE_OTHER): Payer: Self-pay | Admitting: Orthopedic Surgery

## 2015-03-29 VITALS — BP 112/73 | Ht 70.0 in | Wt 218.0 lb

## 2015-03-29 DIAGNOSIS — Z9889 Other specified postprocedural states: Secondary | ICD-10-CM

## 2015-03-29 NOTE — Progress Notes (Signed)
Patient ID: [name], male   DOB: 12-Nov-1945, 69 y.o.   MRN: [id number]  Follow up visit  Chief Complaint  Patient presents with  . Follow-up    post op 1, SARK, DOS 03/24/15    BP 112/73 mmHg  Ht '5\' 10"'$  (1.778 m)  Wt 218 lb (98.884 kg)  BMI 31.28 kg/m2  Encounter Diagnosis  Name Primary?  . S/P right knee arthroscopy Yes    Follow-up after knee arthroscopy on 5 days ago  He has doing well at this time is walking with crutches his knee flexion is 90 he has minimal swelling. No tenderness no redness. Neurovascular exam is intact  Recommend start physical therapy.  Follow up 3 weeks   Preop diagnosis torn medial meniscus right knee  Postop diagnosis torn medial meniscus right knee, plica right knee, osteoarthritis right knee.  Surgeon Aline Brochure no assistants  Gen. anesthesia  Operative findings The medial meniscus is torn at the root. There was a grade 2 lesion thickness wise, and a grade 3 lesion by surface area of the medial femoral condyle. Medial plica was also noted. Significant amount of loose cartilage particles in the joint  Grade 1 lesion and grade 2 lesion trochlea and patella respectively

## 2015-03-29 NOTE — Patient Instructions (Signed)
Call aph therapy dept to schedule therapy visits

## 2015-03-31 ENCOUNTER — Ambulatory Visit (HOSPITAL_COMMUNITY): Payer: Medicare Other | Attending: Orthopedic Surgery | Admitting: Physical Therapy

## 2015-03-31 DIAGNOSIS — Z9181 History of falling: Secondary | ICD-10-CM | POA: Diagnosis present

## 2015-03-31 DIAGNOSIS — Z9889 Other specified postprocedural states: Secondary | ICD-10-CM

## 2015-03-31 DIAGNOSIS — R29898 Other symptoms and signs involving the musculoskeletal system: Secondary | ICD-10-CM | POA: Diagnosis present

## 2015-03-31 DIAGNOSIS — R6889 Other general symptoms and signs: Secondary | ICD-10-CM | POA: Diagnosis present

## 2015-03-31 NOTE — Patient Instructions (Signed)
Straight Leg Raise   Tighten stomach and slowly raise locked right leg _15-18___ inches from floor. Repeat _10___ times per set. Do __2__ sets per session. Do _1-2___ sessions per day.  http://orth.exer.us/1102   Copyright  VHI. All rights reserved.  Bridging   Slowly raise buttocks from floor, keeping stomach tight. Repeat __15__ times per set. Do __2__ sets per session. Do __1-2__ sessions per day.  http://orth.exer.us/1096   Copyright  VHI. All rights reserved.  HIP: Abduction - Side-Lying   Lie on side, legs straight and in line with trunk. Squeeze glutes. Raise top leg up and slightly back. Point toes forward. _10__ reps per set, __2_ sets per session, _1-2__ sets per day.  Copyright  VHI. All rights reserved.  KNEE: Extension, Long Arc Quads - Sitting   Raise leg until knee is straight. __15_ reps per set, _2__ sets per day, _1-2__ days per week  Copyright  VHI. All rights reserved.

## 2015-03-31 NOTE — Therapy (Signed)
[place], [place], [address] Phone: [phone]   Fax:  [phone]  Physical Therapy Evaluation  Patient Details  Name: [name] MRN: [id number] Date of Birth: [birth year] Referring Provider:  Carole Civil, MD  Encounter Date: 03/31/2015      PT End of Session - 03/31/15 1028    Visit Number 1   Number of Visits 10   Date for PT Re-Evaluation 04/28/15   Authorization Type Medicare   Authorization - Visit Number 1   Authorization - Number of Visits 10   PT Start Time 0800   PT Stop Time 0838   PT Time Calculation (min) 38 min   Equipment Utilized During Treatment Gait belt   Activity Tolerance Patient tolerated treatment well   Behavior During Therapy [facility] for tasks assessed/performed      Past Medical History  Diagnosis Date  . Hypertension   . Colitis MAY 2012     CT ABD/PELVIS HEP FLEXURE  . Hyperlipemia   . Arthritis   . NSAID long-term use NAPROXEN FOR OA  . COPD (chronic obstructive pulmonary disease)   . Actinic keratosis   . Diabetes mellitus without complication   . History of kidney stones     Past Surgical History  Procedure Laterality Date  . Cholecystectomy  JUNE 2011 MJ    STONES, PANCREATITIS  . Basal cell carcinoma excision  FACE, arms feet, leg  . Knee surgery Left LEFT    arthroscopy  . Lithotripsy  80s  . Back surgery      spinal stenosis  . Knee arthroscopy with medial menisectomy Right 03/24/2015    Procedure: KNEE ARTHROSCOPY WITH MEDIAL MENISECTOMY;  Surgeon: Carole Civil, MD;  Location: [facility];  Service: Orthopedics;  Laterality: Right;    There were no vitals filed for this visit.  Visit Diagnosis:  S/P right knee arthroscopy  Weakness of right leg  Risk for falls  Decreased functional activity tolerance      Subjective Assessment - 03/31/15 0802    Subjective Pt had R knee arthroscopy done on 03/24/15. He reports that he is not having much  pain, just some soreness. States that he is still having difficulty going up and down stairs, getting off of low surfaces. Denies any difficulty with walking over even or uneven surfaces, but he brings crutch or walker with him if he plans on walking for a long time.    How long can you sit comfortably? no limitations   How long can you stand comfortably? no limitations   How long can you walk comfortably? 15 minutes- has not been walking much since   Currently in Pain? No/denies            [facility] PT Assessment - 03/31/15 0001    Assessment   Onset Date/Surgical Date 03/24/15   Next MD Visit 04/22/15   Prior Therapy no   Balance Screen   Has the patient fallen in the past 6 months No   Has the patient had a decrease in activity level because of a fear of falling?  No   Is the patient reluctant to leave their home because of a fear of falling?  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   Leisure golf   Observation/Other Assessments   Focus on Therapeutic Outcomes (FOTO)  54% limitation   ROM / Strength   AROM / PROM / Strength AROM;Strength   AROM   AROM Assessment Site Hip;Knee   Right/Left Hip Right;Left   Right Hip External Rotation  30   Right Hip Internal Rotation  34   Left Hip External Rotation  36   Left Hip Internal Rotation  31   Right/Left Knee Right;Left   Right Knee Extension -2   Right Knee Flexion 127   Left Knee Extension 0   Left Knee Flexion 135   Strength   Strength Assessment Site Hip;Knee   Right/Left Hip Right;Left   Right Hip Flexion 4/5   Right Hip Extension 3+/5   Right  Hip ABduction 4/5   Left Hip Flexion 4+/5   Left Hip Extension 4-/5   Left Hip ABduction 4/5   Right/Left Knee Right;Left   Right Knee Flexion 4+/5   Right Knee Extension 4/5   Left Knee Flexion 4+/5   Left Knee Extension 4+/5   Transfers   Five time sit to stand comments  12.13"   Ambulation/Gait   Ambulation/Gait Yes   Gait Pattern Step-through pattern;Poor foot clearance - right;Narrow base of support;Antalgic;Decreased stance time - right;Decreased step length - left   Gait Comments TUG 9.93"   Functional Gait  Assessment   Gait assessed  Yes   Gait Level Surface Walks 20 ft in less than 7 sec but greater than 5.5 sec, uses assistive device, slower speed, mild gait deviations, or deviates 6-10 in outside of the 12 in walkway width.   Change in Gait Speed Able to change speed, demonstrates mild gait deviations, deviates 6-10 in outside of the 12 in walkway width, or no gait deviations, unable to achieve a major change in velocity, or uses a change in velocity, or uses an assistive device.   Gait with Horizontal Head Turns Performs head turns smoothly with slight change in gait velocity (eg, minor disruption to smooth gait path), deviates 6-10 in outside 12 in walkway width, or uses an assistive device.   Gait with Vertical Head Turns Performs task with slight change in gait velocity (eg, minor disruption to smooth gait path), deviates 6 - 10 in outside 12 in walkway width or uses assistive device   Gait and Pivot Turn Pivot turns safely in greater than 3 sec and stops with no loss of balance, or pivot turns safely within 3 sec and stops with mild imbalance, requires small steps to catch balance.   Step Over Obstacle Is able to step over one shoe box (4.5 in total height) without changing gait speed. No evidence of imbalance.   Gait with Narrow Base of Support Ambulates 7-9 steps.   Gait with Eyes Closed Walks 20 ft, no assistive devices, good speed, no evidence of imbalance, normal gait  pattern, deviates no more than 6 in outside 12 in walkway width. Ambulates 20 ft in less than 7 sec.   Ambulating Backwards Walks 20 ft, no assistive devices, good speed, no evidence for imbalance, normal gait   Steps Alternating feet, must use rail.   Total Score 22  PT Education - 04-29-2015 1028    Education provided Yes   Education Details POC, goals, HEP   Person(s) Educated Patient   Methods Explanation;Handout   Comprehension Verbalized understanding;Returned demonstration          PT Short Term Goals - 29-Apr-2015 1034    PT SHORT TERM GOAL #1   Title Pt will be independent with HEP.    Time 1   Period Weeks   Status New   PT SHORT TERM GOAL #2   Title Improve five time sit to stand time to 10 seconds to demonstrate improved BLE strength and power.    Time 1   Period Weeks   Status New   PT SHORT TERM GOAL #3   Title Improve functional gait assessment to score of 25 to demonstrate decreased fall risk.    Time 1   Period Weeks   Status New           PT Long Term Goals - 2015/04/29 1036    PT LONG TERM GOAL #1   Title Pt will be independent with advanced HEP.   Time 3   Period Weeks   Status New   PT LONG TERM GOAL #2   Title Improve BLE strength to 4+/5 grossly to allow pt to ascend/descend stairs with reciprocal pattern without pain.    Time 3   Period Weeks   Status New   PT LONG TERM GOAL #3   Title Improve functional gait assessment score to 27 to demonstrate improved balance and decreased fall risk.    Time 3   Period Weeks   Status New   PT LONG TERM GOAL #4   Title Pt will demonstrate proper body mechanics for lifting 25lb box from floor to waist height to allow him to return to work pain free.    Time 3   Period Weeks   Status New               Plan - 29-Apr-2015 1029    Clinical Impression Statement Pt presents to PT following R knee arthroscopy on 03/24/15. He demonstrates impairments in BLE  strength, functional mobility, functional activity tolerance, gait mechanics, and balance. He will benefit from skilled physical therapy services to address these impairments in order to return pt to optimal level of function. Pt works as a Associate Professor at [employer], and needs to be able to lift objects up to 50 pounds for work. Pt demonstrates good ROM in R knee, so treatment will focus on improving knee strength and stability, normalizing gait mechanics, and improving balance.    Pt will benefit from skilled therapeutic intervention in order to improve on the following deficits Abnormal gait;Decreased activity tolerance;Decreased balance;Decreased endurance;Decreased strength;Difficulty walking;Pain   Rehab Potential Good   PT Frequency 3x / week   PT Duration 3 weeks   PT Treatment/Interventions ADLs/Self Care Home Management;Gait training;Stair training;Functional mobility training;Therapeutic activities;Therapeutic exercise;Balance training;Neuromuscular re-education;Patient/family education;Manual techniques   PT Next Visit Plan Begin functional strengthening with lunges, mini squats, TKE   Consulted and Agree with Plan of Care Patient          G-Codes - 04/29/2015 1043    Functional Assessment Tool Used FOTO   Functional Limitation Mobility: Walking and moving around   Mobility: Walking and Moving Around Current Status (G4010) At least 40 percent but less than 60 percent impaired, limited or restricted   Mobility: Walking and Moving Around Goal Status (U7253) At least 20 percent but less  than 40 percent impaired, limited or restricted       Problem List Patient Active Problem List   Diagnosis Date Noted  . Medial meniscus tear   . Primary osteoarthritis of knee   . Synovial plica of knee   . Colitis 01/25/2011  . Colon cancer screening 01/25/2011    Hilma Favors, PT, DPT [phone] 03/31/2015, 10:45 AM  [place], [place], [address] Phone: [phone]   Fax:  [phone]

## 2015-04-02 ENCOUNTER — Ambulatory Visit (HOSPITAL_COMMUNITY): Payer: Medicare Other

## 2015-04-02 DIAGNOSIS — Z9181 History of falling: Secondary | ICD-10-CM

## 2015-04-02 DIAGNOSIS — Z9889 Other specified postprocedural states: Secondary | ICD-10-CM

## 2015-04-02 DIAGNOSIS — R6889 Other general symptoms and signs: Secondary | ICD-10-CM

## 2015-04-02 DIAGNOSIS — R29898 Other symptoms and signs involving the musculoskeletal system: Secondary | ICD-10-CM

## 2015-04-02 NOTE — Therapy (Signed)
[place], [place], [address] Phone: [phone]   Fax:  [phone]  Physical Therapy Treatment  Patient Details  Name: [name] MRN: [id number] Date of Birth: May 31, 1946 Referring Provider:  Carole Civil, MD  Encounter Date: 04/02/2015      PT End of Session - 04/02/15 0809    Visit Number 2   Number of Visits 10   Date for PT Re-Evaluation 04/28/15   Authorization Type Medicare   Authorization - Visit Number 2   Authorization - Number of Visits 10   PT Start Time 0802   PT Stop Time 0843   PT Time Calculation (min) 41 min   Activity Tolerance Patient tolerated treatment well   Behavior During Therapy [facility] for tasks assessed/performed      Past Medical History  Diagnosis Date  . Hypertension   . Colitis MAY 2012     CT ABD/PELVIS HEP FLEXURE  . Hyperlipemia   . Arthritis   . NSAID long-term use NAPROXEN FOR OA  . COPD (chronic obstructive pulmonary disease)   . Actinic keratosis   . Diabetes mellitus without complication   . History of kidney stones     Past Surgical History  Procedure Laterality Date  . Cholecystectomy  JUNE 2011 MJ    STONES, PANCREATITIS  . Basal cell carcinoma excision  FACE, arms feet, leg  . Knee surgery Left LEFT    arthroscopy  . Lithotripsy  80s  . Back surgery      spinal stenosis  . Knee arthroscopy with medial menisectomy Right 03/24/2015    Procedure: KNEE ARTHROSCOPY WITH MEDIAL MENISECTOMY;  Surgeon: Carole Civil, MD;  Location: [facility];  Service: Orthopedics;  Laterality: Right;    There were no vitals filed for this visit.  Visit Diagnosis:  S/P right knee arthroscopy  Weakness of right leg  Risk for falls  Decreased functional activity tolerance      Subjective Assessment - 04/02/15 0807    Subjective Pain free today, compliant with HEP with no questions about exercises.     Currently in Pain? No/denies            [facility] PT  Assessment - 04/02/15 0001    Assessment   Medical Diagnosis Rt knee arthroscopy   Onset Date/Surgical Date 03/24/15   Next MD Visit Aline Brochure 04/22/15                     [facility] PT Treatment/Exercise - 04/02/15 0001    Exercises   Exercises Knee/Hip   Knee/Hip Exercises: Standing   Heel Raises Both;15 reps   Heel Raises Limitations Toe raises   Forward Lunges Both;15 reps   Forward Lunges Limitations 6in step   Terminal Knee Extension Right;10 reps;Theraband   Theraband Level (Terminal Knee Extension) Level 3 (Green)   Terminal Knee Extension Limitations 5" holds   Lateral Step Up Right;10 reps;Hand Hold: 1;Step Height: 4"   Forward Step Up Right;10 reps;Hand Hold: 1;Step Height: 4"   Functional Squat 10 reps;3 seconds   Functional Squat Limitations mini squats, cueing for form   Rocker Board 2 minutes   Knee/Hip Exercises: Supine   Short Arc Quad Sets 15 reps   Terminal Knee Extension 15 reps   Bridges 10 reps   Straight Leg Raises 10 reps   Knee/Hip Exercises: Sidelying   Hip ABduction 15 reps  PT Short Term Goals - 03/31/15 1034    PT SHORT TERM GOAL #1   Title Pt will be independent with HEP.    Time 1   Period Weeks   Status New   PT SHORT TERM GOAL #2   Title Improve five time sit to stand time to 10 seconds to demonstrate improved BLE strength and power.    Time 1   Period Weeks   Status New   PT SHORT TERM GOAL #3   Title Improve functional gait assessment to score of 25 to demonstrate decreased fall risk.    Time 1   Period Weeks   Status New           PT Long Term Goals - 03/31/15 1036    PT LONG TERM GOAL #1   Title Pt will be independent with advanced HEP.   Time 3   Period Weeks   Status New   PT LONG TERM GOAL #2   Title Improve BLE strength to 4+/5 grossly to allow pt to ascend/descend stairs with reciprocal pattern without pain.    Time 3   Period Weeks   Status New   PT LONG TERM GOAL #3   Title  Improve functional gait assessment score to 27 to demonstrate improved balance and decreased fall risk.    Time 3   Period Weeks   Status New   PT LONG TERM GOAL #4   Title Pt will demonstrate proper body mechanics for lifting 25lb box from floor to waist height to allow him to return to work pain free.    Time 3   Period Weeks   Status New               Plan - 04/02/15 5102    Clinical Impression Statement Reviewed goals, compliaince with HEP and copy of evaluation given to pt.  Session focus on functional strengthening, pt able to demonstrate appropriate technique with all exercises following demonstration and min cueing for form and technique with exercises.  No reports of pain through session.   PT Next Visit Plan Continue current PT POC for functional strengthening.  Next session increased forward step up height, begin side lunges and sidestepping.        Problem List Patient Active Problem List   Diagnosis Date Noted  . Medial meniscus tear   . Primary osteoarthritis of knee   . Synovial plica of knee   . Colitis 01/25/2011  . Colon cancer screening 01/25/2011   Ihor Austin, [place]; Lester  Aldona Lento 04/02/2015, 8:55 AM  [place], [place], [address] Phone: [phone]   Fax:  [phone]

## 2015-04-05 ENCOUNTER — Ambulatory Visit (HOSPITAL_COMMUNITY): Payer: Medicare Other

## 2015-04-05 DIAGNOSIS — Z9181 History of falling: Secondary | ICD-10-CM

## 2015-04-05 DIAGNOSIS — Z9889 Other specified postprocedural states: Secondary | ICD-10-CM

## 2015-04-05 DIAGNOSIS — R6889 Other general symptoms and signs: Secondary | ICD-10-CM

## 2015-04-05 DIAGNOSIS — R29898 Other symptoms and signs involving the musculoskeletal system: Secondary | ICD-10-CM

## 2015-04-05 NOTE — Therapy (Signed)
[place], [place], [address] Phone: [phone]   Fax:  [phone]  Physical Therapy Treatment  Patient Details  Name: [name] MRN: [id number] Date of Birth: 11-01-1945 Referring Provider:  Carole Civil, MD  Encounter Date: 04/05/2015      PT End of Session - 04/05/15 1000    Visit Number 3   Number of Visits 10   Date for PT Re-Evaluation 04/28/15   Authorization Type Medicare   Authorization - Visit Number 3   Authorization - Number of Visits 10   PT Start Time 0935   PT Stop Time 1015   PT Time Calculation (min) 40 min   Activity Tolerance Patient tolerated treatment well   Behavior During Therapy [facility] for tasks assessed/performed      Past Medical History  Diagnosis Date  . Hypertension   . Colitis MAY 2012     CT ABD/PELVIS HEP FLEXURE  . Hyperlipemia   . Arthritis   . NSAID long-term use NAPROXEN FOR OA  . COPD (chronic obstructive pulmonary disease)   . Actinic keratosis   . Diabetes mellitus without complication   . History of kidney stones     Past Surgical History  Procedure Laterality Date  . Cholecystectomy  JUNE 2011 MJ    STONES, PANCREATITIS  . Basal cell carcinoma excision  FACE, arms feet, leg  . Knee surgery Left LEFT    arthroscopy  . Lithotripsy  80s  . Back surgery      spinal stenosis  . Knee arthroscopy with medial menisectomy Right 03/24/2015    Procedure: KNEE ARTHROSCOPY WITH MEDIAL MENISECTOMY;  Surgeon: Carole Civil, MD;  Location: [facility];  Service: Orthopedics;  Laterality: Right;    There were no vitals filed for this visit.  Visit Diagnosis:  S/P right knee arthroscopy  Weakness of right leg  Risk for falls  Decreased functional activity tolerance      Subjective Assessment - 04/05/15 0937    Subjective Pt stated knee feels stiff today, compliant with HEP and has been walking about 1,000 yards daily.     Currently in Pain? No/denies                 [facility] Adult PT Treatment/Exercise - 04/05/15 0001    Exercises   Exercises Knee/Hip   Knee/Hip Exercises: Stretches   Active Hamstring Stretch 3 reps;30 seconds   Active Hamstring Stretch Limitations 14in step   Gastroc Stretch 3 reps;30 seconds   Gastroc Stretch Limitations slant board   Knee/Hip Exercises: Standing   Forward Lunges Both;15 reps   Forward Lunges Limitations 4in step   Side Lunges Both;10 reps   Side Lunges Limitations 4in step   Terminal Knee Extension Right;15 reps;Theraband   Theraband Level (Terminal Knee Extension) Level 3 (Green)   Terminal Knee Extension Limitations 5" holds   Lateral Step Up Right;10 reps;Hand Hold: 2;Step Height: 6"   Forward Step Up Right;15 reps;Hand Hold: 1;Step Height: 6"   Step Down Right;10 reps;Hand Hold: 1;Step Height: 4"   Rocker Board 2 minutes   Rocker Board Limitations R/L and A/P   SLS Rt 34" Lt 21" max of 3   Other Standing Knee Exercises 3D hip excursion 10x   Other Standing Knee Exercises Sidestepping GTB 2RT              PT Short Term Goals - 03/31/15 1034    PT SHORT TERM GOAL #1  Title Pt will be independent with HEP.    Time 1   Period Weeks   Status New   PT SHORT TERM GOAL #2   Title Improve five time sit to stand time to 10 seconds to demonstrate improved BLE strength and power.    Time 1   Period Weeks   Status New   PT SHORT TERM GOAL #3   Title Improve functional gait assessment to score of 25 to demonstrate decreased fall risk.    Time 1   Period Weeks   Status New           PT Long Term Goals - 03/31/15 1036    PT LONG TERM GOAL #1   Title Pt will be independent with advanced HEP.   Time 3   Period Weeks   Status New   PT LONG TERM GOAL #2   Title Improve BLE strength to 4+/5 grossly to allow pt to ascend/descend stairs with reciprocal pattern without pain.    Time 3   Period Weeks   Status New   PT LONG TERM GOAL #3   Title Improve functional gait  assessment score to 27 to demonstrate improved balance and decreased fall risk.    Time 3   Period Weeks   Status New   PT LONG TERM GOAL #4   Title Pt will demonstrate proper body mechanics for lifting 25lb box from floor to waist height to allow him to return to work pain free.    Time 3   Period Weeks   Status New               Plan - 04/05/15 1002    Clinical Impression Statement Pt progressing well towards functional strengthening with ability to increase step height with lateral and forward stair training for quad strengthening and began step down with min cueing to improve eccentric control with noted visible musculature fatigue.  Added 3D hip excursion and stretches into program to improve hip mobilty and reduce stiffness for LE.  Added sidestepping, side lunges and SLS for gluteal medius strengthening.  Pt able to complete all exercises wtih good form and technique following cueing, no reports of pain through session.     PT Next Visit Plan Continue current PT POC for functional strengthening.         Problem List Patient Active Problem List   Diagnosis Date Noted  . Medial meniscus tear   . Primary osteoarthritis of knee   . Synovial plica of knee   . Colitis 01/25/2011  . Colon cancer screening 01/25/2011   Ihor Austin, [place]; [place]  Aldona Lento 04/05/2015, 10:39 AM  [place], [place], [address] Phone: [phone]   Fax:  [phone]

## 2015-04-07 ENCOUNTER — Ambulatory Visit (HOSPITAL_COMMUNITY): Payer: Medicare Other | Admitting: Physical Therapy

## 2015-04-07 DIAGNOSIS — Z9889 Other specified postprocedural states: Secondary | ICD-10-CM

## 2015-04-07 DIAGNOSIS — R6889 Other general symptoms and signs: Secondary | ICD-10-CM

## 2015-04-07 DIAGNOSIS — R29898 Other symptoms and signs involving the musculoskeletal system: Secondary | ICD-10-CM

## 2015-04-07 NOTE — Therapy (Signed)
[place], [place], [address] Phone: [phone]   Fax:  [phone]  Physical Therapy Treatment  Patient Details  Name: [name] MRN: [id number] Date of Birth: Aug 16, 1945 Referring Provider:  Carole Civil, MD  Encounter Date: 04/07/2015      PT End of Session - 04/07/15 0844    Visit Number 4   Number of Visits 10   Date for PT Re-Evaluation 04/28/15   Authorization Type Medicare   Authorization - Visit Number 4   Authorization - Number of Visits 10   PT Start Time 0803   PT Stop Time 0845   PT Time Calculation (min) 42 min   Activity Tolerance Patient tolerated treatment well      Past Medical History  Diagnosis Date  . Hypertension   . Colitis MAY 2012     CT ABD/PELVIS HEP FLEXURE  . Hyperlipemia   . Arthritis   . NSAID long-term use NAPROXEN FOR OA  . COPD (chronic obstructive pulmonary disease)   . Actinic keratosis   . Diabetes mellitus without complication   . History of kidney stones     Past Surgical History  Procedure Laterality Date  . Cholecystectomy  JUNE 2011 MJ    STONES, PANCREATITIS  . Basal cell carcinoma excision  FACE, arms feet, leg  . Knee surgery Left LEFT    arthroscopy  . Lithotripsy  80s  . Back surgery      spinal stenosis  . Knee arthroscopy with medial menisectomy Right 03/24/2015    Procedure: KNEE ARTHROSCOPY WITH MEDIAL MENISECTOMY;  Surgeon: Carole Civil, MD;  Location: [facility];  Service: Orthopedics;  Laterality: Right;    There were no vitals filed for this visit.  Visit Diagnosis:  S/P right knee arthroscopy  Weakness of right leg  Decreased functional activity tolerance      Subjective Assessment - 04/07/15 0835    Subjective Pt states he is still stiff; putting on his clothes is still difficult.  States yesterday was a bad day he was in the recliner with ice on his knee most of the day    How long can you sit comfortably? no  limitations   How long can you stand comfortably? no limitations   How long can you walk comfortably? 15 minutes- has not been walking much since   Currently in Pain? Yes   Pain Score 2    Pain Location Knee   Pain Orientation Right   Pain Type Acute pain                         [facility] Adult PT Treatment/Exercise - 04/07/15 0809    Knee/Hip Exercises: Stretches   Active Hamstring Stretch Right;3 reps;30 seconds   Quad Stretch Right;3 reps;30 seconds   Gastroc Stretch Right;3 reps;30 seconds   Knee/Hip Exercises: Standing   Heel Raises 15 reps   Terminal Knee Extension Strengthening;Right;10 reps   Knee/Hip Exercises: Supine   Quad Sets 15 reps   Terminal Knee Extension 15 reps   Knee/Hip Exercises: Prone   Hip Extension 15 reps   Manual Therapy   Manual Therapy Edema management   Edema Management retro massage and decongestive techniques used to decrease edema both anteriorly and posteriorly with noted edema medial aspect of knee and in calf.                 PT Education - 04/07/15 3818  Education provided Yes   Education Details importance of keeping skin moisturized    Person(s) Educated Patient   Methods Explanation   Comprehension Verbalized understanding          PT Short Term Goals - 03/31/15 1034    PT SHORT TERM GOAL #1   Title Pt will be independent with HEP.    Time 1   Period Weeks   Status New   PT SHORT TERM GOAL #2   Title Improve five time sit to stand time to 10 seconds to demonstrate improved BLE strength and power.    Time 1   Period Weeks   Status New   PT SHORT TERM GOAL #3   Title Improve functional gait assessment to score of 25 to demonstrate decreased fall risk.    Time 1   Period Weeks   Status New           PT Long Term Goals - 03/31/15 1036    PT LONG TERM GOAL #1   Title Pt will be independent with advanced HEP.   Time 3   Period Weeks   Status New   PT LONG TERM GOAL #2   Title Improve BLE  strength to 4+/5 grossly to allow pt to ascend/descend stairs with reciprocal pattern without pain.    Time 3   Period Weeks   Status New   PT LONG TERM GOAL #3   Title Improve functional gait assessment score to 27 to demonstrate improved balance and decreased fall risk.    Time 3   Period Weeks   Status New   PT LONG TERM GOAL #4   Title Pt will demonstrate proper body mechanics for lifting 25lb box from floor to waist height to allow him to return to work pain free.    Time 3   Period Weeks   Status New               Plan - 04/07/15 0845    Clinical Impression Statement Pt states no pain at end of treatment.  Added manual to decrease edema and discomfort.  Pt ROM  improving.  Educated pt about the importance of moisturizing .    PT Next Visit Plan begin sit to stand         Problem List Patient Active Problem List   Diagnosis Date Noted  . Medial meniscus tear   . Primary osteoarthritis of knee   . Synovial plica of knee   . Colitis 01/25/2011  . Colon cancer screening 01/25/2011   Rayetta Humphrey, PT CLT [phone] 04/07/2015, 8:47 AM  [place], [place], [address] Phone: [phone]   Fax:  [phone]

## 2015-04-09 ENCOUNTER — Ambulatory Visit (HOSPITAL_COMMUNITY): Payer: Medicare Other | Attending: Orthopedic Surgery

## 2015-04-09 ENCOUNTER — Encounter (HOSPITAL_COMMUNITY): Payer: Self-pay

## 2015-04-09 DIAGNOSIS — Z9889 Other specified postprocedural states: Secondary | ICD-10-CM | POA: Insufficient documentation

## 2015-04-09 DIAGNOSIS — R6889 Other general symptoms and signs: Secondary | ICD-10-CM | POA: Diagnosis present

## 2015-04-09 DIAGNOSIS — Z9181 History of falling: Secondary | ICD-10-CM | POA: Insufficient documentation

## 2015-04-09 DIAGNOSIS — R29898 Other symptoms and signs involving the musculoskeletal system: Secondary | ICD-10-CM | POA: Diagnosis present

## 2015-04-09 NOTE — Therapy (Signed)
[place], [place], [address] Phone: [phone]   Fax:  [phone]  Physical Therapy Treatment  Patient Details  Name: [name] MRN: [id number] Date of Birth: 1945-11-18 Referring Provider:  Carole Civil, MD  Encounter Date: 04/09/2015      PT End of Session - 04/09/15 0923    Visit Number 5   Number of Visits 10   Date for PT Re-Evaluation 04/28/15   Authorization Type Medicare   Authorization - Visit Number 5   Authorization - Number of Visits 10   PT Start Time 4967   PT Stop Time 0922   PT Time Calculation (min) 32 min   Activity Tolerance Patient tolerated treatment well  mild increase in pain with impact at very end of session.    Behavior During Therapy [facility] for tasks assessed/performed      Past Medical History  Diagnosis Date  . Hypertension   . Colitis MAY 2012     CT ABD/PELVIS HEP FLEXURE  . Hyperlipemia   . Arthritis   . NSAID long-term use NAPROXEN FOR OA  . COPD (chronic obstructive pulmonary disease)   . Actinic keratosis   . Diabetes mellitus without complication   . History of kidney stones     Past Surgical History  Procedure Laterality Date  . Cholecystectomy  JUNE 2011 MJ    STONES, PANCREATITIS  . Basal cell carcinoma excision  FACE, arms feet, leg  . Knee surgery Left LEFT    arthroscopy  . Lithotripsy  80s  . Back surgery      spinal stenosis  . Knee arthroscopy with medial menisectomy Right 03/24/2015    Procedure: KNEE ARTHROSCOPY WITH MEDIAL MENISECTOMY;  Surgeon: Carole Civil, MD;  Location: [facility];  Service: Orthopedics;  Laterality: Right;    There were no vitals filed for this visit.  Visit Diagnosis:  S/P right knee arthroscopy  Weakness of right leg  Decreased functional activity tolerance  Risk for falls      Subjective Assessment - 04/09/15 0852    Subjective Pt reports knee more stiff than painful today,                           [facility] Adult PT Treatment/Exercise - 04/09/15 0001    Knee/Hip Exercises: Stretches   Active Hamstring Stretch Right;3 reps;30 seconds   Gastroc Stretch Right;3 reps;30 seconds   Gastroc Stretch Limitations slant board   Knee/Hip Exercises: Standing   Forward Step Up Both;10 reps;Step Height: 8";Hand Hold: 1;1 set  (Step up and over) 6"step + 2 inch airex   Functional Squat 2 sets;15 reps  butt taps@ 20" height   SLS 3x 30sec bilat   Other Standing Knee Exercises --  3x30sec bilat   Knee/Hip Exercises: Supine   Quad Sets 1 set;10 reps;AROM;Right  alternating heel slides   Short Arc Quad Sets 2 sets;10 reps;Right;Strengthening  5# around foot, foam roll under thigh   Heel Slides 1 set;10 reps;AAROM;Right  c rope, alteranting quad sets (135+ degrees knee flexion)   Bridges 10 reps;2 sets;Strengthening;Both  8" step, knee straight   Bridges with Clamshell Strengthening;Both;2 sets;10 reps  GreenTB                PT Education - 04/09/15 [phone]    Education provided No          PT Short Term Goals - 03/31/15 1034  PT SHORT TERM GOAL #1   Title Pt will be independent with HEP.    Time 1   Period Weeks   Status New   PT SHORT TERM GOAL #2   Title Improve five time sit to stand time to 10 seconds to demonstrate improved BLE strength and power.    Time 1   Period Weeks   Status New   PT SHORT TERM GOAL #3   Title Improve functional gait assessment to score of 25 to demonstrate decreased fall risk.    Time 1   Period Weeks   Status New           PT Long Term Goals - 03/31/15 1036    PT LONG TERM GOAL #1   Title Pt will be independent with advanced HEP.   Time 3   Period Weeks   Status New   PT LONG TERM GOAL #2   Title Improve BLE strength to 4+/5 grossly to allow pt to ascend/descend stairs with reciprocal pattern without pain.    Time 3   Period Weeks   Status New   PT LONG TERM GOAL #3   Title Improve  functional gait assessment score to 27 to demonstrate improved balance and decreased fall risk.    Time 3   Period Weeks   Status New   PT LONG TERM GOAL #4   Title Pt will demonstrate proper body mechanics for lifting 25lb box from floor to waist height to allow him to return to work pain free.    Time 3   Period Weeks   Status New               Plan - 04/09/15 1829    Clinical Impression Statement Pt continues to make progress toward goals showing improved strength with functional squats and SAQ. Pt contniues to be most limited by c/o stiffness pain with step down impact. Balance training is improving but continues to be very impaired.    Pt will benefit from skilled therapeutic intervention in order to improve on the following deficits Abnormal gait;Decreased activity tolerance;Decreased balance;Decreased endurance;Decreased strength;Difficulty walking;Pain   Rehab Potential Good   PT Frequency 3x / week   PT Duration 3 weeks   PT Treatment/Interventions ADLs/Self Care Home Management;Gait training;Stair training;Functional mobility training;Therapeutic activities;Therapeutic exercise;Balance training;Neuromuscular re-education;Patient/family education;Manual techniques   PT Next Visit Plan Continue to progress balance and stability; strength and ROM are approaching WNL.    PT Home Exercise Plan No changes    Consulted and Agree with Plan of Care Patient        Problem List Patient Active Problem List   Diagnosis Date Noted  . Medial meniscus tear   . Primary osteoarthritis of knee   . Synovial plica of knee   . Colitis 01/25/2011  . Colon cancer screening 01/25/2011    [name] C 04/09/2015, 9:27 AM  9:28 AM  Etta Grandchild, PT, DPT [place] License # [id number]    \   [place] [address] [place], [place], [address] Phone: [phone]   Fax:  [phone]

## 2015-04-09 NOTE — Patient Instructions (Signed)
Continue with HEP and icing at home as needed.

## 2015-04-13 ENCOUNTER — Ambulatory Visit (HOSPITAL_COMMUNITY): Payer: Medicare Other

## 2015-04-13 DIAGNOSIS — R29898 Other symptoms and signs involving the musculoskeletal system: Secondary | ICD-10-CM

## 2015-04-13 DIAGNOSIS — Z9181 History of falling: Secondary | ICD-10-CM

## 2015-04-13 DIAGNOSIS — Z9889 Other specified postprocedural states: Secondary | ICD-10-CM | POA: Diagnosis not present

## 2015-04-13 DIAGNOSIS — R6889 Other general symptoms and signs: Secondary | ICD-10-CM

## 2015-04-13 NOTE — Therapy (Signed)
[place], [place], [address] Phone: [phone]   Fax:  [phone]  Physical Therapy Treatment  Patient Details  Name: [name] MRN: [id number] Date of Birth: [birth year] Referring Provider:  Carole Civil, MD  Encounter Date: 04/13/2015      PT End of Session - 04/13/15 0828    Visit Number 6   Number of Visits 10   Date for PT Re-Evaluation 04/28/15   Authorization Type Medicare   Authorization - Visit Number 6   Authorization - Number of Visits 10   PT Start Time 0801   PT Stop Time 0842   PT Time Calculation (min) 41 min   Activity Tolerance Patient tolerated treatment well   Behavior During Therapy [facility] for tasks assessed/performed      Past Medical History  Diagnosis Date  . Hypertension   . Colitis MAY 2012     CT ABD/PELVIS HEP FLEXURE  . Hyperlipemia   . Arthritis   . NSAID long-term use NAPROXEN FOR OA  . COPD (chronic obstructive pulmonary disease)   . Actinic keratosis   . Diabetes mellitus without complication   . History of kidney stones     Past Surgical History  Procedure Laterality Date  . Cholecystectomy  JUNE 2011 MJ    STONES, PANCREATITIS  . Basal cell carcinoma excision  FACE, arms feet, leg  . Knee surgery Left LEFT    arthroscopy  . Lithotripsy  80s  . Back surgery      spinal stenosis  . Knee arthroscopy with medial menisectomy Right 03/24/2015    Procedure: KNEE ARTHROSCOPY WITH MEDIAL MENISECTOMY;  Surgeon: Carole Civil, MD;  Location: [facility];  Service: Orthopedics;  Laterality: Right;    There were no vitals filed for this visit.  Visit Diagnosis:  S/P right knee arthroscopy  Weakness of right leg  Decreased functional activity tolerance  Risk for falls      Subjective Assessment - 04/13/15 0805    Subjective Pt stated no real pain in knee, more stiffness today   Currently in Pain? No/denies              [facility] Adult PT  Treatment/Exercise - 04/13/15 0001    Exercises   Exercises Knee/Hip   Knee/Hip Exercises: Stretches   Active Hamstring Stretch Right;3 reps;30 seconds   Active Hamstring Stretch Limitations 14in step   Quad Stretch Right;3 reps;30 seconds   Quad Stretch Limitations prone with rope   Gastroc Stretch 3 reps;30 seconds   Gastroc Stretch Limitations slant board   Knee/Hip Exercises: Standing   Lateral Step Up Right;15 reps;Hand Hold: 1;Step Height: 6"   Forward Step Up Right;Step Height: 8"  6in step height with 2in    Step Down Right;15 reps;Hand Hold: 0;Step Height: 6"   Functional Squat 2 sets;15 reps   SLS Rt 22", Lt 42" max of 3   Other Standing Knee Exercises Tandem stance 3x 30", tandem and retro gait on balance beam 1RT each min guard   Knee/Hip Exercises: Supine   Short Arc Quad Sets 15 reps   Knee/Hip Exercises: Prone   Hip Extension 15 reps   Other Prone Exercises TKE 15x 5"            PT Short Term Goals - 03/31/15 1034    PT SHORT TERM GOAL #1   Title Pt will be independent with HEP.    Time 1   Period Weeks  Status New   PT SHORT TERM GOAL #2   Title Improve five time sit to stand time to 10 seconds to demonstrate improved BLE strength and power.    Time 1   Period Weeks   Status New   PT SHORT TERM GOAL #3   Title Improve functional gait assessment to score of 25 to demonstrate decreased fall risk.    Time 1   Period Weeks   Status New           PT Long Term Goals - 03/31/15 1036    PT LONG TERM GOAL #1   Title Pt will be independent with advanced HEP.   Time 3   Period Weeks   Status New   PT LONG TERM GOAL #2   Title Improve BLE strength to 4+/5 grossly to allow pt to ascend/descend stairs with reciprocal pattern without pain.    Time 3   Period Weeks   Status New   PT LONG TERM GOAL #3   Title Improve functional gait assessment score to 27 to demonstrate improved balance and decreased fall risk.    Time 3   Period Weeks   Status New    PT LONG TERM GOAL #4   Title Pt will demonstrate proper body mechanics for lifting 25lb box from floor to waist height to allow him to return to work pain free.    Time 3   Period Weeks   Status New               Plan - 04/13/15 0831    Clinical Impression Statement Pt progressing well towards functional strengthening and balance though does continue to have impairements with SLS.  Added tandem stance and gait on balance beam with min guard required.  Progressed functional strengthening with increased height with step down training and cueing to improve eccentic control while descending.  AROM is improving 1-139 degrees.   PT Next Visit Plan Continue to progress balance and stability; strengthening.  Begin warrior poses next session.          Problem List Patient Active Problem List   Diagnosis Date Noted  . Medial meniscus tear   . Primary osteoarthritis of knee   . Synovial plica of knee   . Colitis 01/25/2011  . Colon cancer screening 01/25/2011   [name], LPTA; [place]   Aldona Lento 04/13/2015, 8:44 AM  [place], [place], [address] Phone: [phone]   Fax:  [phone]

## 2015-04-14 ENCOUNTER — Ambulatory Visit (HOSPITAL_COMMUNITY): Payer: Medicare Other

## 2015-04-14 DIAGNOSIS — R6889 Other general symptoms and signs: Secondary | ICD-10-CM

## 2015-04-14 DIAGNOSIS — Z9889 Other specified postprocedural states: Secondary | ICD-10-CM

## 2015-04-14 DIAGNOSIS — Z9181 History of falling: Secondary | ICD-10-CM

## 2015-04-14 DIAGNOSIS — R29898 Other symptoms and signs involving the musculoskeletal system: Secondary | ICD-10-CM

## 2015-04-14 NOTE — Therapy (Signed)
[place], [place], [address] Phone: [phone]   Fax:  [phone]  Physical Therapy Treatment  Patient Details  Name: [name] MRN: [id number] Date of Birth: Jun 25, 1946 Referring Provider:  Carole Civil, MD  Encounter Date: 04/14/2015      PT End of Session - 04/14/15 0836    Visit Number 7   Number of Visits 10   Date for PT Re-Evaluation 04/28/15   Authorization Type Medicare   Authorization - Visit Number 7   Authorization - Number of Visits 10   PT Start Time 0801   PT Stop Time 0843   PT Time Calculation (min) 42 min   Activity Tolerance Patient tolerated treatment well   Behavior During Therapy [facility] for tasks assessed/performed      Past Medical History  Diagnosis Date  . Hypertension   . Colitis MAY 2012     CT ABD/PELVIS HEP FLEXURE  . Hyperlipemia   . Arthritis   . NSAID long-term use NAPROXEN FOR OA  . COPD (chronic obstructive pulmonary disease)   . Actinic keratosis   . Diabetes mellitus without complication   . History of kidney stones     Past Surgical History  Procedure Laterality Date  . Cholecystectomy  JUNE 2011 MJ    STONES, PANCREATITIS  . Basal cell carcinoma excision  FACE, arms feet, leg  . Knee surgery Left LEFT    arthroscopy  . Lithotripsy  80s  . Back surgery      spinal stenosis  . Knee arthroscopy with medial menisectomy Right 03/24/2015    Procedure: KNEE ARTHROSCOPY WITH MEDIAL MENISECTOMY;  Surgeon: Carole Civil, MD;  Location: [facility];  Service: Orthopedics;  Laterality: Right;    There were no vitals filed for this visit.  Visit Diagnosis:  S/P right knee arthroscopy  Weakness of right leg  Decreased functional activity tolerance  Risk for falls      Subjective Assessment - 04/14/15 0807    Subjective No real pain, just stiffness   Currently in Pain? No/denies           [facility] Adult PT Treatment/Exercise - 04/14/15 0001    Exercises   Exercises Knee/Hip   Knee/Hip Exercises: Stretches   Active Hamstring Stretch Right;3 reps;30 seconds   Active Hamstring Stretch Limitations 14in step   Quad Stretch Right;3 reps;30 seconds   Quad Stretch Limitations prone with rope   Gastroc Stretch 3 reps;30 seconds   Gastroc Stretch Limitations slant board   Knee/Hip Exercises: Standing   Heel Raises 15 reps  proper lifting yellow ball off 14in step then heel raise   Heel Raises Limitations heel walking   Lateral Step Up Right;15 reps;Hand Hold: 1;Step Height: 6"   Forward Step Up Right;Step Height: 8"  6in height with 2in on airex   Step Down Right;15 reps;Hand Hold: 0;Step Height: 6"   Functional Squat 15 reps   Functional Squat Limitations proper lifting yellow ball off 14in step then heel raise   SLS Rt 20", Lt 31"   Other Standing Knee Exercises Warrior pose I and II 2x 30"   Other Standing Knee Exercises Sidestepping BTB 2RT; sumowalking   Knee/Hip Exercises: Prone   Other Prone Exercises TKE 15x 5"            PT Short Term Goals - 03/31/15 1034    PT SHORT TERM GOAL #1   Title Pt will be independent with HEP.  Time 1   Period Weeks   Status New   PT SHORT TERM GOAL #2   Title Improve five time sit to stand time to 10 seconds to demonstrate improved BLE strength and power.    Time 1   Period Weeks   Status New   PT SHORT TERM GOAL #3   Title Improve functional gait assessment to score of 25 to demonstrate decreased fall risk.    Time 1   Period Weeks   Status New           PT Long Term Goals - 03/31/15 1036    PT LONG TERM GOAL #1   Title Pt will be independent with advanced HEP.   Time 3   Period Weeks   Status New   PT LONG TERM GOAL #2   Title Improve BLE strength to 4+/5 grossly to allow pt to ascend/descend stairs with reciprocal pattern without pain.    Time 3   Period Weeks   Status New   PT LONG TERM GOAL #3   Title Improve functional gait assessment score to 27 to  demonstrate improved balance and decreased fall risk.    Time 3   Period Weeks   Status New   PT LONG TERM GOAL #4   Title Pt will demonstrate proper body mechanics for lifting 25lb box from floor to waist height to allow him to return to work pain free.    Time 3   Period Weeks   Status New               Plan - 04/14/15 5498    Clinical Impression Statement Progressed stability with warrior poses, min cueing for form.  Functional strengthening progressing well with improve eccentric control while descending.  Began proper lifting with good form demonstrated following instructions.  Balance is improving though does continue to demonstrate impairements with SLS.   PT Next Visit Plan Continue to progress balance and stability; strengthening.  Continue warrior poses next session, begin hurdles on balance beam next session.        Problem List Patient Active Problem List   Diagnosis Date Noted  . Medial meniscus tear   . Primary osteoarthritis of knee   . Synovial plica of knee   . Colitis 01/25/2011  . Colon cancer screening 01/25/2011   Ihor Austin, [place]; [place]  Aldona Lento 04/14/2015, 8:49 AM  [place], [place], [address] Phone: [phone]   Fax:  [phone]

## 2015-04-16 ENCOUNTER — Ambulatory Visit (HOSPITAL_COMMUNITY): Payer: Medicare Other

## 2015-04-16 ENCOUNTER — Encounter (HOSPITAL_COMMUNITY): Payer: Self-pay

## 2015-04-16 DIAGNOSIS — Z9181 History of falling: Secondary | ICD-10-CM

## 2015-04-16 DIAGNOSIS — R29898 Other symptoms and signs involving the musculoskeletal system: Secondary | ICD-10-CM

## 2015-04-16 DIAGNOSIS — Z9889 Other specified postprocedural states: Secondary | ICD-10-CM | POA: Diagnosis not present

## 2015-04-16 DIAGNOSIS — R6889 Other general symptoms and signs: Secondary | ICD-10-CM

## 2015-04-16 NOTE — Therapy (Addendum)
[place], [place], [address] Phone: [phone]   Fax:  [phone]  Physical Therapy Treatment  Patient Details  Name: [name] MRN: [id number] Date of Birth: 1946/06/13 Referring Provider:  Carole Civil, MD  Encounter Date: 04/16/2015      PT End of Session - 04/16/15 1010    Visit Number 8   Number of Visits 10   Date for PT Re-Evaluation 04/28/15   Authorization Type Medicare   Authorization - Visit Number 8   Authorization - Number of Visits 10   PT Start Time 0940   PT Stop Time 1009   PT Time Calculation (min) 29 min   Activity Tolerance Patient tolerated treatment well;No increased pain   Behavior During Therapy [facility] for tasks assessed/performed      Past Medical History  Diagnosis Date  . Hypertension   . Colitis MAY 2012     CT ABD/PELVIS HEP FLEXURE  . Hyperlipemia   . Arthritis   . NSAID long-term use NAPROXEN FOR OA  . COPD (chronic obstructive pulmonary disease)   . Actinic keratosis   . Diabetes mellitus without complication   . History of kidney stones     Past Surgical History  Procedure Laterality Date  . Cholecystectomy  JUNE 2011 MJ    STONES, PANCREATITIS  . Basal cell carcinoma excision  FACE, arms feet, leg  . Knee surgery Left LEFT    arthroscopy  . Lithotripsy  80s  . Back surgery      spinal stenosis  . Knee arthroscopy with medial menisectomy Right 03/24/2015    Procedure: KNEE ARTHROSCOPY WITH MEDIAL MENISECTOMY;  Surgeon: Carole Civil, MD;  Location: [facility];  Service: Orthopedics;  Laterality: Right;    There were no vitals filed for this visit.  Visit Diagnosis:  S/P right knee arthroscopy  Weakness of right leg  Decreased functional activity tolerance  Risk for falls      Subjective Assessment - 04/16/15 0945    Subjective Pt remains quit epainfrul and stiff, in both knees, which he reports is related to back to back session this week.     How long can you sit comfortably? no limitations   How long can you stand comfortably? no limitations   How long can you walk comfortably? Pt is mostly limited in pain, having difficulty falling asleep at night.    Currently in Pain? Yes   Pain Score 2    Pain Location Knee   Pain Orientation Right;Left   Pain Descriptors / Indicators Aching   Pain Type Surgical pain                         [facility] Adult PT Treatment/Exercise - 04/16/15 0001    Transfers   Five time sit to stand comments  9.94s   Ambulation/Gait   Gait Comments TUG 8.63   Knee/Hip Exercises: Stretches   Active Hamstring Stretch 3 reps;30 seconds;Both   Active Hamstring Stretch Limitations 14in step   Knee: Self-Stretch to increase Flexion Right  lunge stretch, 10x5s   Gastroc Stretch 3 reps;30 seconds   Gastroc Stretch Limitations slant board   Knee/Hip Exercises: Standing   SLS 3x30s bilat  single UE support.                PT Education - 04/16/15 1009    Education provided Yes   Education Details Being proactive about pain management,  and decreasing opposition to pain medications.    Person(s) Educated Patient   Methods Explanation   Comprehension Verbalized understanding;Need further instruction          PT Short Term Goals - 04/16/15 1015    PT SHORT TERM GOAL #2   Status Achieved           PT Long Term Goals - 04/16/15 1015    PT LONG TERM GOAL #2   Status Achieved               Plan - 04/16/15 1011    Clinical Impression Statement Pt showing great progress in functional mobility , strength and ROM, with stair climbing, five time sit to stand, TUG, and ROM all within normal limits. Pt continues to be most limited by swelling, pain (worse at night), and combined rotation and extension  movement sin the knee. Pt enouraged to bring concerns to MD at follow up on Monday. Rehab focus should shift for isolated knee mobility and strengthening, and begin to return  to leisure activities like golf, addresing mobility restirctions in the kinetic chain associated with hip, lumbar, adn Thoracic spine rotation.    Pt will benefit from skilled therapeutic intervention in order to improve on the following deficits Abnormal gait;Decreased activity tolerance;Decreased balance;Decreased endurance;Decreased strength;Difficulty walking;Pain   Rehab Potential Good   PT Frequency 3x / week   PT Duration 3 weeks   PT Next Visit Plan Rehab focus should shift for isolated knee mobility and strengthening, and begin to return to leisure activities like golf, addresing mobility restirctions in the kinetic chain associated with hip, lumbar, adn Thoracic spine rotation. Update advanced HEP for discharge.    PT Home Exercise Plan Needs updating next session.    Consulted and Agree with Plan of Care Patient        Problem List Patient Active Problem List   Diagnosis Date Noted  . Medial meniscus tear   . Primary osteoarthritis of knee   . Synovial plica of knee   . Colitis 01/25/2011  . Colon cancer screening 01/25/2011     PHYSICAL THERAPY DISCHARGE SUMMARY  Visits from Start of Care: 8  Current functional level related to goals / functional outcomes: Pt has not returned to PT since last visit on 04/16/15.  Unable to assess current functional level.    Remaining deficits: Unable to assess   Education / Equipment: N/A  Plan: Patient agrees to discharge.  Patient goals were not met. Patient is being discharged due to not returning since the last visit.  ?????   Hilma Favors, PT, DPT [phone]      Etta Grandchild 04/16/2015, 10:16 AM 10:16 AM  Etta Grandchild, PT, DPT [place] License # [id number]      [facility] [address] [place], [place], [address] Phone: [phone]   Fax:  [phone]

## 2015-04-16 NOTE — Patient Instructions (Signed)
Continue to progress AMB at home, talk to MD Monday about continued pain and difficulty sleeping.

## 2015-04-19 ENCOUNTER — Ambulatory Visit (HOSPITAL_COMMUNITY): Payer: Medicare Other | Admitting: Physical Therapy

## 2015-04-19 ENCOUNTER — Encounter: Payer: Self-pay | Admitting: Orthopedic Surgery

## 2015-04-19 ENCOUNTER — Ambulatory Visit (INDEPENDENT_AMBULATORY_CARE_PROVIDER_SITE_OTHER): Payer: Medicare Other | Admitting: Orthopedic Surgery

## 2015-04-19 VITALS — BP 136/79 | Ht 70.0 in | Wt 218.0 lb

## 2015-04-19 DIAGNOSIS — Z9889 Other specified postprocedural states: Secondary | ICD-10-CM

## 2015-04-19 DIAGNOSIS — M1711 Unilateral primary osteoarthritis, right knee: Secondary | ICD-10-CM

## 2015-04-19 NOTE — Progress Notes (Signed)
Patient ID: [name], male   DOB: 06-30-1946, 69 y.o.   MRN: [id number]  Follow up visit  Chief Complaint  Patient presents with  . Follow-up    Post op #2, right knee, SARK, DOS 03-24-15.    BP 136/79 mmHg  Ht '5\' 10"'$  (1.778 m)  Wt 218 lb (98.884 kg)  BMI 31.28 kg/m2  Encounter Diagnoses  Name Primary?  . S/P right knee arthroscopy   . Primary osteoarthritis of knee, right Yes    The patient complains of medial knee pain. Exam shows no tenderness over the medial joint line but tenderness over the medial femoral condyle where we noted on x-ray and MRI and arthroscopy that he had a grade 3 chondral lesion  Recommend stopping physical therapy as he has full range of motion and a normal straight leg raise. Recommend home exercises come back in 3 weeks if necessary aspirate inject as there was a mild effusion

## 2015-04-21 ENCOUNTER — Encounter (HOSPITAL_COMMUNITY): Payer: Medicare Other | Admitting: Physical Therapy

## 2015-04-22 ENCOUNTER — Ambulatory Visit: Payer: Medicare Other | Admitting: Orthopedic Surgery

## 2015-04-23 ENCOUNTER — Encounter (HOSPITAL_COMMUNITY): Payer: Medicare Other | Admitting: Physical Therapy

## 2015-04-26 ENCOUNTER — Encounter (HOSPITAL_COMMUNITY): Payer: Medicare Other | Admitting: Physical Therapy

## 2015-04-28 ENCOUNTER — Encounter (HOSPITAL_COMMUNITY): Payer: Medicare Other | Admitting: Physical Therapy

## 2015-04-30 ENCOUNTER — Encounter (HOSPITAL_COMMUNITY): Payer: Medicare Other | Admitting: Physical Therapy

## 2015-05-10 ENCOUNTER — Ambulatory Visit (INDEPENDENT_AMBULATORY_CARE_PROVIDER_SITE_OTHER): Payer: Medicare Other | Admitting: Orthopedic Surgery

## 2015-05-10 ENCOUNTER — Ambulatory Visit (INDEPENDENT_AMBULATORY_CARE_PROVIDER_SITE_OTHER): Payer: Medicare Other

## 2015-05-10 VITALS — BP 137/78 | Ht 70.0 in | Wt 218.0 lb

## 2015-05-10 DIAGNOSIS — M25562 Pain in left knee: Secondary | ICD-10-CM

## 2015-05-10 DIAGNOSIS — Z9889 Other specified postprocedural states: Secondary | ICD-10-CM | POA: Diagnosis not present

## 2015-05-10 NOTE — Progress Notes (Signed)
Patient ID: [name], male   DOB: Sep 11, 1945, 69 y.o.   MRN: [id number]  Follow up visit  Chief Complaint  Patient presents with  . Follow-up    3 week recheck on right knee, DOS 03-24-15.    BP 137/78 mmHg  Ht '5\' 10"'$  (1.778 m)  Wt 218 lb (98.884 kg)  BMI 31.28 kg/m2  No diagnosis found.  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  After cleaning the skin with alcohol a 20-gauge needle was used to inject the left knee joint. There were no complications. A sterile bandage was applied.     Left hip pain recommend see neurosurgeon who did initial decompression  Right knee recommend aspiration  Procedure note injection and aspiration right knee joint  Verbal consent was obtained to aspirate and inject the right knee joint   Timeout was completed to confirm the site of aspiration and injection  An 18-gauge needle was used to aspirate the knee joint from a suprapatellar lateral approach.  The medications used were 40 mg of Depo-Medrol and 1% lidocaine 3 cc  Anesthesia was provided by ethyl chloride and the skin was prepped with alcohol.  After cleaning the skin with alcohol an 18-gauge needle was used to aspirate the right  knee joint.  We obtained 30  cc of fluid  We follow this by injection of 40 mg of Depo-Medrol and 3 cc 1% lidocaine.  There were no complications. A sterile bandage was applied.  Return in one month

## 2015-05-10 NOTE — Patient Instructions (Signed)
Joint Injection  Care After  Refer to this sheet in the next few days. These instructions provide you with information on caring for yourself after you have had a joint injection. Your caregiver also may give you more specific instructions. Your treatment has been planned according to current medical practices, but problems sometimes occur. Call your caregiver if you have any problems or questions after your procedure.  After any type of joint injection, it is not uncommon to experience:  · Soreness, swelling, or bruising around the injection site.  · Mild numbness, tingling, or weakness around the injection site caused by the numbing medicine used before or with the injection.  It also is possible to experience the following effects associated with the specific agent after injection:  · Iodine-based contrast agents:  ¨ Allergic reaction (itching, hives, widespread redness, and swelling beyond the injection site).  · Corticosteroids (These effects are rare.):  ¨ Allergic reaction.  ¨ Increased blood sugar levels (If you have diabetes and you notice that your blood sugar levels have increased, notify your caregiver).  ¨ Increased blood pressure levels.  ¨ Mood swings.  · Hyaluronic acid in the use of viscosupplementation.  ¨ Temporary heat or redness.  ¨ Temporary rash and itching.  ¨ Increased fluid accumulation in the injected joint.  These effects all should resolve within a day after your procedure.   HOME CARE INSTRUCTIONS  · Limit yourself to light activity the day of your procedure. Avoid lifting heavy objects, bending, stooping, or twisting.  · Take prescription or over-the-counter pain medication as directed by your caregiver.  · You may apply ice to your injection site to reduce pain and swelling the day of your procedure. Ice may be applied 03-04 times:  ¨ Put ice in a plastic bag.  ¨ Place a towel between your skin and the bag.  ¨ Leave the ice on for no longer than 15-20 minutes each time.  SEEK  IMMEDIATE MEDICAL CARE IF:   · Pain and swelling get worse rather than better or extend beyond the injection site.  · Numbness does not go away.  · Blood or fluid continues to leak from the injection site.  · You have chest pain.  · You have swelling of your face or tongue.  · You have trouble breathing or you become dizzy.  · You develop a fever, chills, or severe tenderness at the injection site that last longer than 1 day.  MAKE SURE YOU:  · Understand these instructions.  · Watch your condition.  · Get help right away if you are not doing well or if you get worse.  Document Released: 04/06/2011 Document Revised: 10/16/2011 Document Reviewed: 04/06/2011  [employer]® Patient Information ©2015 [employer]. This information is not intended to replace advice given to you by your health care provider. Make sure you discuss any questions you have with your health care provider.

## 2015-06-01 ENCOUNTER — Other Ambulatory Visit (HOSPITAL_COMMUNITY): Payer: Self-pay | Admitting: Family Medicine

## 2015-06-01 DIAGNOSIS — M5442 Lumbago with sciatica, left side: Secondary | ICD-10-CM

## 2015-06-02 ENCOUNTER — Ambulatory Visit (HOSPITAL_COMMUNITY)
Admission: RE | Admit: 2015-06-02 | Discharge: 2015-06-02 | Disposition: A | Discharge status: Home / Self Care | Payer: Medicare Other | Source: Ambulatory Visit | Attending: Family Medicine | Admitting: Family Medicine

## 2015-06-02 DIAGNOSIS — M1288 Other specific arthropathies, not elsewhere classified, other specified site: Secondary | ICD-10-CM | POA: Diagnosis not present

## 2015-06-02 DIAGNOSIS — M545 Low back pain: Secondary | ICD-10-CM | POA: Insufficient documentation

## 2015-06-02 DIAGNOSIS — R200 Anesthesia of skin: Secondary | ICD-10-CM | POA: Insufficient documentation

## 2015-06-02 DIAGNOSIS — M4806 Spinal stenosis, lumbar region: Secondary | ICD-10-CM | POA: Insufficient documentation

## 2015-06-02 DIAGNOSIS — M5126 Other intervertebral disc displacement, lumbar region: Secondary | ICD-10-CM | POA: Diagnosis not present

## 2015-06-02 DIAGNOSIS — M5442 Lumbago with sciatica, left side: Secondary | ICD-10-CM

## 2015-06-02 DIAGNOSIS — M5127 Other intervertebral disc displacement, lumbosacral region: Secondary | ICD-10-CM | POA: Diagnosis not present

## 2015-06-07 ENCOUNTER — Ambulatory Visit (INDEPENDENT_AMBULATORY_CARE_PROVIDER_SITE_OTHER): Payer: Medicare Other | Admitting: Orthopedic Surgery

## 2015-06-07 ENCOUNTER — Encounter: Payer: Self-pay | Admitting: Orthopedic Surgery

## 2015-06-07 VITALS — BP 144/76 | Ht 70.0 in | Wt 215.0 lb

## 2015-06-07 DIAGNOSIS — M4806 Spinal stenosis, lumbar region: Secondary | ICD-10-CM | POA: Diagnosis not present

## 2015-06-07 DIAGNOSIS — M170 Bilateral primary osteoarthritis of knee: Secondary | ICD-10-CM

## 2015-06-07 DIAGNOSIS — Z9889 Other specified postprocedural states: Secondary | ICD-10-CM | POA: Diagnosis not present

## 2015-06-07 DIAGNOSIS — M48061 Spinal stenosis, lumbar region without neurogenic claudication: Secondary | ICD-10-CM

## 2015-06-07 NOTE — Progress Notes (Signed)
This is a follow-up visit   clarity is needed for this.  The patient had a right knee arthroscopy he has arthritis in a medial meniscal tear he had a meniscectomy that is doing very well  Left knee started bothering him we gave him the injection he has no pain but some crepitance with range of motion and weightbearing.  He also just had workup for his lumbar spine which showed he had degenerative disc disease and spinal stenosis. Here is the report that was reviewed  ROS LEG PAIN RADIATING FROM BACK   EXAM  BP 144/76 mmHg  Ht '5\' 10"'$  (1.778 m)  Wt 215 lb (97.523 kg)  BMI 30.85 kg/m2  LEFT KNEE no pain or effusion, ROM is 5-125, all ligs were stable RIGHT KNEE mild medial joint line tenderness, ROM 5-120, all ligs stable  mcmurrays negative in both kneess   IMPRESSION: 1. At L5-S1 there is a mild broad-based disc bulge with a focal left paracentral disc protrusion with mass effect on the left intraspinal S1 nerve root. Moderate left foraminal stenosis. Mild right foraminal stenosis. Mild bilateral facet arthropathy. 2. At L4-5 there is a moderate broad-based disc bulge flattening the ventral thecal sac. Moderate bilateral facet arthropathy. Mild spinal stenosis. Moderate bilateral foraminal stenosis. 3. At L2-3 there is a moderate broad-based disc bulge flattening the ventral thecal sac with a superimposed right paracentral disc protrusion with mild mass effect on the intraspinal right L3 nerve root. Bilateral mild facet arthropathy. Mild spinal stenosis. 4. At L1-2 there is a moderate broad-based disc bulge flattening the ventral thecal sac. Mild bilateral facet arthropathy. Bilateral lateral recess narrowing and moderate spinal stenosis.     Electronically Signed   By: Kathreen Devoid   On: 06/03/2015 08:50  Encounter Diagnoses  Name Primary?  . S/P right knee arthroscopy stable doing well Yes  . Primary osteoarthritis of both knees stable    . Spinal stenosis of lumbar  region not improved to see Neurosurgery

## 2015-11-11 ENCOUNTER — Encounter (HOSPITAL_COMMUNITY): Payer: Self-pay

## 2018-07-25 ENCOUNTER — Ambulatory Visit (HOSPITAL_COMMUNITY)
Admission: RE | Admit: 2018-07-25 | Discharge: 2018-07-25 | Disposition: A | Discharge status: Home / Self Care | Payer: Medicare Other | Source: Ambulatory Visit | Attending: Family Medicine | Admitting: Family Medicine

## 2018-07-25 ENCOUNTER — Other Ambulatory Visit (HOSPITAL_COMMUNITY): Payer: Self-pay | Admitting: Family Medicine

## 2018-07-25 DIAGNOSIS — R2231 Localized swelling, mass and lump, right upper limb: Secondary | ICD-10-CM

## 2018-08-21 ENCOUNTER — Inpatient Hospital Stay (HOSPITAL_COMMUNITY)
Admission: EM | Admit: 2018-08-21 | Discharge: 2018-08-26 | DRG: 180 | Disposition: A | Discharge status: Home / Self Care | Payer: Medicare Other | Attending: Family Medicine | Admitting: Family Medicine

## 2018-08-21 ENCOUNTER — Encounter: Payer: Self-pay | Admitting: Radiation Oncology

## 2018-08-21 ENCOUNTER — Other Ambulatory Visit: Payer: Self-pay

## 2018-08-21 ENCOUNTER — Encounter (HOSPITAL_COMMUNITY): Payer: Self-pay | Admitting: Emergency Medicine

## 2018-08-21 ENCOUNTER — Emergency Department (HOSPITAL_COMMUNITY): Payer: Medicare Other

## 2018-08-21 DIAGNOSIS — M1990 Unspecified osteoarthritis, unspecified site: Secondary | ICD-10-CM | POA: Diagnosis present

## 2018-08-21 DIAGNOSIS — E883 Tumor lysis syndrome: Secondary | ICD-10-CM | POA: Diagnosis not present

## 2018-08-21 DIAGNOSIS — I2699 Other pulmonary embolism without acute cor pulmonale: Secondary | ICD-10-CM | POA: Diagnosis present

## 2018-08-21 DIAGNOSIS — I82611 Acute embolism and thrombosis of superficial veins of right upper extremity: Secondary | ICD-10-CM | POA: Diagnosis not present

## 2018-08-21 DIAGNOSIS — C7931 Secondary malignant neoplasm of brain: Secondary | ICD-10-CM | POA: Diagnosis present

## 2018-08-21 DIAGNOSIS — Z86711 Personal history of pulmonary embolism: Secondary | ICD-10-CM

## 2018-08-21 DIAGNOSIS — R591 Generalized enlarged lymph nodes: Secondary | ICD-10-CM | POA: Diagnosis not present

## 2018-08-21 DIAGNOSIS — R590 Localized enlarged lymph nodes: Secondary | ICD-10-CM | POA: Diagnosis not present

## 2018-08-21 DIAGNOSIS — I7121 Aneurysm of the ascending aorta, without rupture: Secondary | ICD-10-CM | POA: Diagnosis present

## 2018-08-21 DIAGNOSIS — I712 Thoracic aortic aneurysm, without rupture: Secondary | ICD-10-CM | POA: Diagnosis present

## 2018-08-21 DIAGNOSIS — R079 Chest pain, unspecified: Secondary | ICD-10-CM

## 2018-08-21 DIAGNOSIS — Z85828 Personal history of other malignant neoplasm of skin: Secondary | ICD-10-CM | POA: Diagnosis not present

## 2018-08-21 DIAGNOSIS — E119 Type 2 diabetes mellitus without complications: Secondary | ICD-10-CM | POA: Diagnosis present

## 2018-08-21 DIAGNOSIS — Z79899 Other long term (current) drug therapy: Secondary | ICD-10-CM | POA: Diagnosis not present

## 2018-08-21 DIAGNOSIS — J449 Chronic obstructive pulmonary disease, unspecified: Secondary | ICD-10-CM | POA: Diagnosis not present

## 2018-08-21 DIAGNOSIS — Z9049 Acquired absence of other specified parts of digestive tract: Secondary | ICD-10-CM | POA: Diagnosis not present

## 2018-08-21 DIAGNOSIS — C787 Secondary malignant neoplasm of liver and intrahepatic bile duct: Secondary | ICD-10-CM | POA: Diagnosis present

## 2018-08-21 DIAGNOSIS — Z7982 Long term (current) use of aspirin: Secondary | ICD-10-CM

## 2018-08-21 DIAGNOSIS — Z87442 Personal history of urinary calculi: Secondary | ICD-10-CM

## 2018-08-21 DIAGNOSIS — Z7984 Long term (current) use of oral hypoglycemic drugs: Secondary | ICD-10-CM

## 2018-08-21 DIAGNOSIS — C3491 Malignant neoplasm of unspecified part of right bronchus or lung: Principal | ICD-10-CM | POA: Diagnosis present

## 2018-08-21 DIAGNOSIS — E785 Hyperlipidemia, unspecified: Secondary | ICD-10-CM | POA: Diagnosis not present

## 2018-08-21 DIAGNOSIS — Z87891 Personal history of nicotine dependence: Secondary | ICD-10-CM | POA: Diagnosis not present

## 2018-08-21 DIAGNOSIS — Z7901 Long term (current) use of anticoagulants: Secondary | ICD-10-CM | POA: Diagnosis not present

## 2018-08-21 DIAGNOSIS — I10 Essential (primary) hypertension: Secondary | ICD-10-CM | POA: Diagnosis present

## 2018-08-21 DIAGNOSIS — I871 Compression of vein: Secondary | ICD-10-CM | POA: Diagnosis not present

## 2018-08-21 DIAGNOSIS — Z66 Do not resuscitate: Secondary | ICD-10-CM | POA: Diagnosis not present

## 2018-08-21 HISTORY — DX: Malignant (primary) neoplasm, unspecified: C80.1

## 2018-08-21 LAB — BASIC METABOLIC PANEL
ANION GAP: 10 (ref 5–15)
BUN: 24 mg/dL — AB (ref 8–23)
CO2: 28 mmol/L (ref 22–32)
CREATININE: 0.93 mg/dL (ref 0.61–1.24)
Calcium: 9.4 mg/dL (ref 8.9–10.3)
Chloride: 100 mmol/L (ref 98–111)
GFR calc Af Amer: >60 mL/min (ref >60)
GLUCOSE: 92 mg/dL (ref 70–99)
Potassium: 4 mmol/L (ref 3.5–5.1)
Sodium: 138 mmol/L (ref 135–145)

## 2018-08-21 LAB — CBC
HCT: 41.1 % (ref 39.0–52.0)
Hemoglobin: 13.3 g/dL (ref 13.0–17.0)
MCH: 30.5 pg (ref 26.0–34.0)
MCHC: 32.4 g/dL (ref 30.0–36.0)
MCV: 94.3 fL (ref 80.0–100.0)
Platelets: 315 10*3/uL (ref 150–400)
RBC: 4.36 MIL/uL (ref 4.22–5.81)
RDW: 13.2 % (ref 11.5–15.5)
WBC: 9.8 10*3/uL (ref 4.0–10.5)
nRBC: 0 % (ref 0.0–0.2)

## 2018-08-21 LAB — TROPONIN I: Troponin I: <0.03 ng/mL (ref <0.03)

## 2018-08-21 LAB — SEDIMENTATION RATE: Sed Rate: 10 mm/hr (ref 0–16)

## 2018-08-21 LAB — BRAIN NATRIURETIC PEPTIDE: B Natriuretic Peptide: 26 pg/mL (ref 0.0–100.0)

## 2018-08-21 MED ORDER — POLYETHYLENE GLYCOL 3350 17 G PO PACK: 17.0000 g | PACK | Freq: Every day | ORAL | Status: DC | PRN

## 2018-08-21 MED ORDER — ACETAMINOPHEN 650 MG RE SUPP: 650.0000 mg | Freq: Four times a day (QID) | RECTAL | Status: DC | PRN

## 2018-08-21 MED ORDER — METOCLOPRAMIDE HCL 5 MG/ML IJ SOLN
5.0000 mg | Freq: Once | INTRAMUSCULAR | Status: AC
  Administered 2018-08-21: 5 mg via INTRAVENOUS
  Filled 2018-08-21: qty 2

## 2018-08-21 MED ORDER — ROSUVASTATIN CALCIUM 10 MG PO TABS
10.0000 mg | ORAL_TABLET | Freq: Every day | ORAL | Status: DC
  Administered 2018-08-24 – 2018-08-26 (×3): 10 mg via ORAL
  Filled 2018-08-21 (×5): qty 1

## 2018-08-21 MED ORDER — IPRATROPIUM-ALBUTEROL 0.5-2.5 (3) MG/3ML IN SOLN
3.0000 mL | Freq: Four times a day (QID) | RESPIRATORY_TRACT | Status: DC
  Administered 2018-08-22: 3 mL via RESPIRATORY_TRACT
  Filled 2018-08-21 (×2): qty 3

## 2018-08-21 MED ORDER — ONDANSETRON HCL 4 MG/2ML IJ SOLN
4.0000 mg | Freq: Four times a day (QID) | INTRAMUSCULAR | Status: DC | PRN
  Administered 2018-08-22 – 2018-08-23 (×2): 4 mg via INTRAVENOUS
  Filled 2018-08-21 (×3): qty 2

## 2018-08-21 MED ORDER — IOPAMIDOL (ISOVUE-370) INJECTION 76%
150.0000 mL | Freq: Once | INTRAVENOUS | Status: AC | PRN
  Administered 2018-08-21: 100 mL via INTRAVENOUS

## 2018-08-21 MED ORDER — GUAIFENESIN-DM 100-10 MG/5ML PO SYRP
5.0000 mL | ORAL_SOLUTION | Freq: Three times a day (TID) | ORAL | Status: AC
  Filled 2018-08-21 (×2): qty 10

## 2018-08-21 MED ORDER — INSULIN ASPART 100 UNIT/ML ~~LOC~~ SOLN
0.0000 [IU] | Freq: Three times a day (TID) | SUBCUTANEOUS | Status: DC
  Administered 2018-08-24 – 2018-08-25 (×3): 2 [IU] via SUBCUTANEOUS
  Administered 2018-08-25 (×2): 1 [IU] via SUBCUTANEOUS
  Administered 2018-08-26: 2 [IU] via SUBCUTANEOUS
  Administered 2018-08-26: 1 [IU] via SUBCUTANEOUS

## 2018-08-21 MED ORDER — ASPIRIN 81 MG PO CHEW
324.0000 mg | CHEWABLE_TABLET | Freq: Once | ORAL | Status: AC
  Administered 2018-08-21: 324 mg via ORAL
  Filled 2018-08-21: qty 4

## 2018-08-21 MED ORDER — IPRATROPIUM-ALBUTEROL 0.5-2.5 (3) MG/3ML IN SOLN: 3.0000 mL | RESPIRATORY_TRACT | Status: DC | PRN

## 2018-08-21 MED ORDER — ACETAMINOPHEN 325 MG PO TABS
650.0000 mg | ORAL_TABLET | Freq: Four times a day (QID) | ORAL | Status: DC | PRN
  Administered 2018-08-21: 650 mg via ORAL
  Filled 2018-08-21 (×2): qty 2

## 2018-08-21 MED ORDER — PROMETHAZINE HCL 25 MG/ML IJ SOLN
12.5000 mg | Freq: Once | INTRAMUSCULAR | Status: AC
  Administered 2018-08-22: 12.5 mg via INTRAVENOUS
  Filled 2018-08-21: qty 1

## 2018-08-21 MED ORDER — SODIUM CHLORIDE 0.9 % IV SOLN
INTRAVENOUS | Status: AC
  Administered 2018-08-21 – 2018-08-22 (×2): via INTRAVENOUS

## 2018-08-21 MED ORDER — SODIUM CHLORIDE 0.9 % IV BOLUS
500.0000 mL | Freq: Once | INTRAVENOUS | Status: AC
  Administered 2018-08-21: 500 mL via INTRAVENOUS

## 2018-08-21 MED ORDER — DIPHENHYDRAMINE HCL 50 MG/ML IJ SOLN
12.5000 mg | Freq: Once | INTRAMUSCULAR | Status: AC
  Administered 2018-08-21: 12.5 mg via INTRAVENOUS
  Filled 2018-08-21: qty 1

## 2018-08-21 MED ORDER — SODIUM CHLORIDE 0.9% FLUSH
3.0000 mL | Freq: Once | INTRAVENOUS | Status: AC
  Administered 2018-08-21: 3 mL via INTRAVENOUS

## 2018-08-21 MED ORDER — ONDANSETRON HCL 4 MG PO TABS: 4.0000 mg | ORAL_TABLET | Freq: Four times a day (QID) | ORAL | Status: DC | PRN

## 2018-08-21 NOTE — H&P (Addendum)
History and Physical    CREEK GAN FBP:[id number] DOB: [birth year] DOA: 08/21/2018  PCP: Lucia Gaskins, MD   Patient coming from: Home  I have personally briefly reviewed patient's old medical records in [place]  Chief Complaint: SOB, chest pain, almost passing out  HPI: [name] is a 73 y.o. male with medical history significant for HTN, DM, COPD, who presented to the ED with complaints of intermittent episodes of difficulty breathing and chest pain involving his upper chest from above his axilla towards his neck.  Over the past 3 weeks he has had 2 syncopal episodes and 2 near syncopal episodes last episode was this morning.  Difficulty breathing and chest pain is associated with headache and diaphoresis.  No aggravating or relieving factors.  He also feels like his throat is closing up and his chest is congested.  Patient also reports a mass in his right axilla, noticed about 2 to 3 months ago, sister reports within the past week the size of the mass has doubled.  He denies obvious swelling of his face or neck. He is not on home O2.  He also reports productive cough.  No fevers or chills. Patient was recently admitted at [facility], 1/9- 08/24/18 for similar symptoms, after another episode of nearly passing out.  Patient was diagnosed with a small pulmonary embolism and started on Eliquis he also had an FNA done-sample was taken from his right axilla.  Results of which are still pending.  He also had work-up for syncope-ultrasound of his heart and neck. He was supposed to follow-up with an oncologist at [facility] oncology center 08/26/17.   ED Course: Blood pressure systolic 82-423.  On room air.  Chest x-ray-chronically thickened bronchovascular markings and enlarged pulmonary arteries suggesting pulmonary hypertension, soft tissue control both right superior mediastinum, chest CT recommended.  This point CTA shows bulky mediastinal bilateral hilar and bilateral axillary  lymphadenopathy, possibly Hodgkin's lymphoma.  No PE.  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  Initial EKG showed T wave inversion in V2, not evident on subsequent EKGs.  Troponin normal.  Patient was given aspirin 500 mill bolus.  Hospitalist to admit for chest pain.  Review of Systems: As per HPI all other systems reviewed and negative  Past Medical History:  Diagnosis Date  . Actinic keratosis   . Arthritis   . Colitis MAY 2012    CT ABD/PELVIS HEP FLEXURE  . COPD (chronic obstructive pulmonary disease) ([place])   . Diabetes mellitus without complication ([place])   . History of kidney stones   . Hyperlipemia   . Hypertension   . NSAID long-term use NAPROXEN FOR OA    Past Surgical History:  Procedure Laterality Date  . BACK SURGERY     spinal stenosis  . BASAL CELL CARCINOMA EXCISION  FACE, arms feet, leg  . CHOLECYSTECTOMY  JUNE 2011 MJ   STONES, PANCREATITIS  . KNEE ARTHROSCOPY WITH MEDIAL MENISECTOMY Right 03/24/2015   Procedure: KNEE ARTHROSCOPY WITH MEDIAL MENISECTOMY;  Surgeon: Carole Civil, MD;  Location: [facility];  Service: Orthopedics;  Laterality: Right;  . KNEE SURGERY Left LEFT   arthroscopy  . LITHOTRIPSY  80s  . SPINE SURGERY       reports that he quit smoking about 2 weeks ago. His smoking use included cigarettes.  He has a 50.00 pack-year smoking history. He has never used smokeless tobacco. He reports current alcohol use. He reports that he does not use drugs.  No Known Allergies  Family History  Problem Relation Age of Onset  . Colon polyps Neg Hx   . Colon cancer Neg Hx     Prior to Admission medications   Medication Sig Start Date End Date Taking? Authorizing Provider  albuterol (PROVENTIL HFA;VENTOLIN HFA) 108 (90 Base) MCG/ACT inhaler  INHALE 2 PUFFS INTO THE LUNGS QID 06/22/18  Yes [provider]  aspirin 81 MG tablet Take 81 mg by mouth daily.     Yes [provider]  Cholecalciferol 50 MCG (2000 UT) TBDP Take 1 tablet by mouth daily.   Yes [provider]  ELIQUIS 5 MG TABS tablet Take 5 mg by mouth 2 (two) times daily.  08/20/18  Yes [provider]  metFORMIN (GLUCOPHAGE) 500 MG tablet Take 500 mg by mouth 2 (two) times daily with a meal.   Yes [provider]  Multiple Vitamin (MULTIVITAMIN) tablet Take 1 tablet by mouth daily.   Yes [provider]  rosuvastatin (CRESTOR) 10 MG tablet Take 10 mg by mouth daily.     Yes [provider]  telmisartan-hydrochlorothiazide (MICARDIS HCT) 40-12.5 MG per tablet Take 1 tablet by mouth daily.     Yes [provider]  verapamil (COVERA HS) 240 MG (CO) 24 hr tablet Take 240 mg by mouth at bedtime.     Yes [provider]    Physical Exam: Vitals:   08/21/18 1315 08/21/18 1330 08/21/18 1400 08/21/18 1430  BP: 99/63 112/70 103/64 99/64  Pulse: (!) 50 (!) 51 (!) 57 (!) 53  Resp:  17 (!) 22 17  Temp: 97.9 F (36.6 C)     TempSrc: Oral     SpO2: 97%     Weight:      Height:        Constitutional: NAD, calm, comfortable, on room air Vitals:   08/21/18 1315 08/21/18 1330 08/21/18 1400 08/21/18 1430  BP: 99/63 112/70 103/64 99/64  Pulse: (!) 50 (!) 51 (!) 57 (!) 53  Resp:  17 (!) 22 17  Temp: 97.9 F (36.6 C)     TempSrc: Oral     SpO2: 97%     Weight:      Height:       Eyes: PERRL, lids and conjunctivae normal ENMT: Mucous membranes are moist. Posterior pharynx clear of any exudate or lesions. Neck: normal, supple, no masses, no thyromegaly Respiratory: clear to auscultation bilaterally, no wheezing, no crackles. Normal respiratory effort. No accessory muscle use.  Cardiovascular: Regular rate and rhythm, 2/6 systolic murmur / rubs / gallops. No extremity edema. 2+ pedal pulses.     Abdomen: no tenderness, no masses palpated. No hepatosplenomegaly. Bowel sounds positive.  Musculoskeletal: no clubbing / cyanosis. No joint deformity upper and lower extremities. Good ROM, no contractures. Normal muscle tone. ~6 by ~6 cm axillary mass, non tender. Skin: no rashes, lesions, ulcers. No induration Neurologic: CN 2-12 grossly intact.  Strength 5/5 in all 4.  Psychiatric: Normal judgment and insight. Alert and oriented x 3. Normal mood.   Labs on Admission: I have personally reviewed following labs and imaging studies  CBC: Recent Labs  Lab 08/21/18 1254  WBC 9.8  HGB 13.3  HCT 41.1  MCV 94.3  PLT 932   Basic Metabolic Panel: Recent Labs  Lab 08/21/18 1254  NA 138  K 4.0  CL 100  CO2 28  GLUCOSE 92  BUN 24*  CREATININE 0.93  CALCIUM 9.4   Cardiac Enzymes: Recent Labs  Lab 08/21/18 1254  TROPONINI <0.03    Radiological Exams on Admission: Dg Chest 2 View  Result Date: 08/21/2018 CLINICAL DATA:  Chest pain, sob, nausea, and headache intermittently for over a week. Was seen and d/c from [facility] last week with same s/s. Hx COPD, HTN, DM, pulm embolism. Smoker. EXAM: CHEST - 2 VIEW COMPARISON:  07/25/2018 and older exams. FINDINGS: Cardiac silhouette is top-normal in size. There is a smooth concave soft tissue bulge from the right superior mediastinum, most likely vascular, but more prominent than on prior exams. There is no deviation or mass effect on the trachea. Mediastinum otherwise normal in contour. Main pulmonary arteries appear enlarged. There are thickened bronchovascular markings bilaterally stable from the prior exam. No evidence of pneumonia or pulmonary edema. No pleural effusion or pneumothorax. Skeletal structures are intact. IMPRESSION: 1. No acute cardiopulmonary disease. 2. Chronically thickened bronchovascular markings and enlarged pulmonary arteries, the latter suggesting pulmonary hypertension. 3. Soft tissue contour bulge along the  right superior mediastinum that is most likely vascular. This could be further assessed with chest CT. Electronically Signed   By: Lajean Manes M.D.   On: 08/21/2018 12:56   Ct Head Wo Contrast  Result Date: 08/21/2018 CLINICAL DATA:  Chest pain, sob and headache intermittently for over a week. Was seen and d/c from [facility] last week with same s/s. Has not seen pcp, has an appointment on Friday. EXAM: CT HEAD WITHOUT CONTRAST TECHNIQUE: Contiguous axial images were obtained from the base of the skull through the vertex without intravenous contrast. COMPARISON:  None. FINDINGS: Brain: No evidence of acute infarction, hemorrhage, hydrocephalus, extra-axial collection or mass lesion/mass effect. There are mild areas of white matter hypoattenuation consistent with chronic microvascular ischemic change. Vascular: No hyperdense vessel or unexpected calcification. Skull: Normal. Negative for fracture or focal lesion. Sinuses/Orbits: Globes and orbits are unremarkable. The sinuses and mastoid air cells are clear. Other: None. IMPRESSION: 1. No acute intracranial abnormalities. 2. Mild chronic microvascular ischemic change. Electronically Signed   By: Lajean Manes M.D.   On: 08/21/2018 14:43   Ct Angio Chest Pe W And/or Wo Contrast  Result Date: 08/21/2018 CLINICAL DATA:  One-week history of intermittent chest pain, shortness of breath and headaches. EXAM: CT ANGIOGRAPHY CHEST WITH CONTRAST TECHNIQUE: Multidetector CT imaging of the chest was performed using the standard protocol during bolus administration of intravenous contrast. Multiplanar CT image reconstructions and MIPs were obtained to evaluate the vascular anatomy. CONTRAST:  119mL ISOVUE-370 IOPAMIDOL INJECTION 76% IV. COMPARISON:  CT chest 01/06/2010. FINDINGS: Cardiovascular: Contrast opacification of the pulmonary arterial system and the systemic arterial system good and equivalent. No filling defects within either main pulmonary artery or  their segmental branches in either lung to suggest pulmonary embolism. Heart moderately enlarged. Dense mitral valvular calcifications. Moderate to severe three-vessel coronary atherosclerosis. No pericardial effusion. Moderate atherosclerosis involving the thoracic and proximal abdominal aorta. Ascending thoracic aortic aneurysm measuring up to approximately 4.6 cm. Atherosclerosis at the origin of the great vessels without evidence of stenosis. Direct origin of the LEFT vertebral artery from the aortic arch. Severe narrowing of the proximal and mid SVC by massive lymphadenopathy which will be detailed below. Numerous collaterals are present in the RIGHT UPPER chest and back (contrast injection was performed in the RIGHT UPPER extremity). Mediastinum/Nodes: Massive mediastinal, BILATERAL hilar and BILATERAL  axillary lymphadenopathy. Enlarged lymph nodes are present at all 3 levels of both the RIGHT and LEFT axilla. BILATERAL supraclavicular lymphadenopathy is also present. Index conglomerate RIGHT UPPER paratracheal lymph nodes (station 2R) measure approximately 8.8 x 4.7 cm (series 5, image 23). Conglomerate RIGHT axillary lymph nodes measure approximately 4.9 x 4.3 cm (image 48). Conglomerate low LEFT axillary lymph nodes measure approximately 6.4 x 3.4 cm (image 53). Conglomerate subcarinal mediastinal (station 7 lymphadenopathy measures approximately 3.4 x 7.9 cm (image 54). Conglomerate RIGHT axillary lymph nodes measure approximately 5.1 x 6.1 cm (image 14).Index conglomerate LEFT axillary nodal mass measures approximately 4.1 x 7.8 cm on coronal image 71. Pericardial/duplication cyst adjacent to the RIGHT atrium in the LOWER mediastinum measures approximately 4.6 x 2.9 cm, slightly increased in size since the prior CT. Ormal appearing esophagus which I suspect is compressed by the massive mediastinal lymphadenopathy. Normal-appearing thyroid gland. Lungs/Pleura: Mild emphysematous changes throughout both  lungs. Tree in bud nodular opacities in the RIGHT MIDDLE LOBE. Lungs otherwise clear. No confluent airspace consolidation. No evidence of interstitial lung disease. Subpleural lipoma involving the LEFT LATERAL chest wall. No pleural effusions. Central airways patent with moderate to severe bronchial wall thickening. The LEFT UPPER LOBE and LEFT LOWER LOBE bronchi are compressed by the bulky lymphadenopathy. The RIGHT lobar bronchi do not appear significantly compressed. Upper Abdomen: Approximate 1.5 x 1.6 cm mass in the fat adjacent to the RIGHT lobe of the liver (image 95). Normal sized celiac axis and gastrohepatic ligament lymph nodes; no pathologic lymphadenopathy in the upper abdomen. No retrocrural lymphadenopathy. At least 3 accessory splenules in the [place]. Mild pancreatic atrophy. Surgically absent gallbladder. Musculoskeletal: Mild-to-moderate diffuse thoracic spine degenerative disc disease and spondylosis. Degenerative disc disease and spondylosis involving the visualized LOWER cervical spine. No acute findings. No evidence of osseous metastatic disease. Review of the MIP images confirms the above findings. IMPRESSION: 1. Bulky mediastinal, BILATERAL hilar and BILATERAL axillary lymphadenopathy, with index conglomerate nodal masses measured above. Lymphoma is suspected, possibly Hodgkin's lymphoma. 2. No evidence of pulmonary embolism. 3. 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 4. Approximate 1.6 cm mass involving the fat adjacent to the RIGHT lobe of the liver. No evidence of pathologic lymphadenopathy in the visualized upper abdomen. 5. Tree in bud opacities in the RIGHT MIDDLE LOBE, likely an acute inflammatory process. Severe central bronchial wall thickening indicates asthma and/or bronchitis. No acute cardiopulmonary disease otherwise. 6. Benign pericardial/duplication cyst adjacent to the RIGHT atrium. 7. Ascending thoracic  aortic aneurysm measuring approximately 4.6 cm diameter. Recommend semi-annual imaging followup by CTA or MRA and referral to cardiothoracic surgery if not already obtained. This recommendation follows 2010 ACCF/AHA/AATS/ACR/ASA/SCA/SCAI/SIR/STS/SVM Guidelines for the Diagnosis and Management of Patients With Thoracic Aortic Disease. Circulation. 2010; 121: U440-H474. 8. Moderate cardiomegaly. Dense aortic valvular calcifications. Moderate three-vessel coronary atherosclerosis. Aortic Atherosclerosis (ICD10-I70.0) and Emphysema (ICD10-J43.9). Aortic aneurysm NOS (ICD10-I71.9). Electronically Signed   By: Evangeline Dakin M.D.   On: 08/21/2018 15:09    EKG: Independently reviewed.  Sinus rhythm QTc 402.  Initial T wave inversion V2, not evident on subsequent EKGs.  Assessment/Plan Active Problems:   SVC syndrome  Bulky lymphadenopathy with SVC syndrome-upper chest pain, difficulty breathing but comfortable on room air, syncope, headache with hypotensive to soft blood pressure.  CT issues bulky mediastinal hilar and axillary adenopathy.  Possibly Hodgkin's lymphoma, With severe stenosis involving SVC from compression by mediastinal nodal mass. - I consulted oncology who  recommended consulting radiation oncology, Talked to Dr. Isidore Moos, patient will be seen in a.m. Agrees with transfer to [place] records from [facility], including FNA results  - IV N/s 100cc/hr x 1 day - NPO mid night -Please reconsult oncology on arrival at [facility] long in a.m. -Addendum-detailed records from [place] faxed over.  Please see physical chart.  In summary-08/16/18 biopsy results cytology-positive for neoplasia, secondary small cell carcinoma.  Sheets of small neoplastic cells.  Positive for CK, CD56, CD 117, TTF,NSE,Synaptophysin.  K1-67 is positive in over 90% of tumor cells.  Consistent with lung primary. Flow interpretation-no immunophenotypic evidence of a monoclonal B-cell or aberrant T-cell  population detected. Leukocyte assessment-no monoclonal B-cell population is detected. Echocardiogram-EF 65%, G1DD, mild AR.   Pulmonary embolism-recent diagnosis at [facility]  CTA chest today no evidence of PE.  Records from [place] reveal-CT chest showed small PE in descending branch of left pulmonary artery. - Hold Eliquis, pending Oncology and Radiation Onc eval tomorrow.  COPD-between episodes of chest pain and difficulty breathing patient's has no difficulty breathing.  But has a productive cough.  No wheezing on exam.  Chest CT suggests asthma and/or bronchitis.  Patient was treated with steroids and antibiotics azithromycin at [facility].  Shortness of breath with mild 2 L O2 requirement at that time was attributed to bronchitis and small PE. -Duo nebs scheduled and PRN -Mucolytics  Ascending thoracic aortic aneurysm- incidental finding on chest CTA measuring approximately 4.6 cm.  Semi-and well follow-up with CTA or MRA and referral to cardiothoracic surgery recommended. - F/u as outpt  HTN-systolic blood pressure 95-284.  -Hold home verapamil, telmisartan HCTZ  DM-glucose 92. -Hold home metformin - SSI  DVT prophylaxis: Scds Code Status: Full Family Communication: Sister at bedside Disposition Plan: Per rounding team Consults called: Radiation Oncology Admission status: Obs, tele   Bethena Roys MD [facility] Pager [phone] From  3PM- 11PM.  Otherwise please contact night-coverage [email or web address] Password [facility]  08/21/2018, 8:59 PM

## 2018-08-21 NOTE — ED Triage Notes (Signed)
Chest pain, sob and headache intermittently for over a week.  Was seen and d/c from [facility] last week with same s/s.  Has not seen pcp, has an appointment on Friday.

## 2018-08-21 NOTE — Progress Notes (Signed)
Rapid response has arrived to evaluate patient due to HR 25-28 on 2 separate occasions

## 2018-08-21 NOTE — ED Provider Notes (Signed)
[facility] EMERGENCY DEPARTMENT Provider Note   CSN: [id number] Arrival date & time: 08/21/18  1222     History   Chief Complaint Chief Complaint  Patient presents with  . Shortness of Breath    HPI Abhiram Criado Dolph is a 73 y.o. male.  HPI  73 year old male presents with chest pain, shortness of breath, and headache.  He states the symptoms have been intermittent over 3 weeks but has happened twice today.  He was seen at [facility] and admitted a few days ago.  Per paperwork he showed me, he was diagnosed with syncope and small pulmonary embolism.  He is currently on Eliquis.  He states the headache, chest burning and shortness of breath come and go together.  He also has nausea.  The headache is right-sided.  Very similar to when he was admitted.  No focal weakness, numbness, blurry vision.  Headache is about a 5 out of 10.  Past Medical History:  Diagnosis Date  . Actinic keratosis   . Arthritis   . Colitis MAY 2012    CT ABD/PELVIS HEP FLEXURE  . COPD (chronic obstructive pulmonary disease) ([place])   . Diabetes mellitus without complication ([place])   . History of kidney stones   . Hyperlipemia   . Hypertension   . NSAID long-term use NAPROXEN FOR OA    Patient Active Problem List   Diagnosis Date Noted  . Medial meniscus tear   . Primary osteoarthritis of knee   . Synovial plica of knee   . Colitis 01/25/2011  . Colon cancer screening 01/25/2011    Past Surgical History:  Procedure Laterality Date  . BACK SURGERY     spinal stenosis  . BASAL CELL CARCINOMA EXCISION  FACE, arms feet, leg  . CHOLECYSTECTOMY  JUNE 2011 MJ   STONES, PANCREATITIS  . KNEE ARTHROSCOPY WITH MEDIAL MENISECTOMY Right 03/24/2015   Procedure: KNEE ARTHROSCOPY WITH MEDIAL MENISECTOMY;  Surgeon: Carole Civil, MD;  Location: [facility];  Service: Orthopedics;  Laterality: Right;  . KNEE SURGERY Left LEFT   arthroscopy  . LITHOTRIPSY  80s  . SPINE SURGERY          Home Medications      Prior to Admission medications   Medication Sig Start Date End Date Taking? Authorizing Provider  albuterol (PROVENTIL HFA;VENTOLIN HFA) 108 (90 Base) MCG/ACT inhaler INHALE 2 PUFFS INTO THE LUNGS QID 06/22/18  Yes [provider]  aspirin 81 MG tablet Take 81 mg by mouth daily.     Yes [provider]  Cholecalciferol 50 MCG (2000 UT) TBDP Take 1 tablet by mouth daily.   Yes [provider]  ELIQUIS 5 MG TABS tablet Take 5 mg by mouth 2 (two) times daily.  08/20/18  Yes [provider]  metFORMIN (GLUCOPHAGE) 500 MG tablet Take 500 mg by mouth 2 (two) times daily with a meal.   Yes [provider]  Multiple Vitamin (MULTIVITAMIN) tablet Take 1 tablet by mouth daily.   Yes [provider]  rosuvastatin (CRESTOR) 10 MG tablet Take 10 mg by mouth daily.     Yes [provider]  telmisartan-hydrochlorothiazide (MICARDIS HCT) 40-12.5 MG per tablet Take 1 tablet by mouth daily.     Yes [provider]  verapamil (COVERA HS) 240 MG (CO) 24 hr tablet Take 240 mg by mouth at bedtime.     Yes [provider]    Family History Family History  Problem Relation Age of Onset  .  Colon polyps Neg Hx   . Colon cancer Neg Hx     Social History Social History   Tobacco Use  . Smoking status: Former Smoker    Packs/day: 1.00    Years: 50.00    Pack years: 50.00    Types: Cigarettes    Last attempt to quit: 08/07/2018    Years since quitting: 0.0  . Smokeless tobacco: Never Used  Substance Use Topics  . Alcohol use: Yes    Comment: once a month  . Drug use: No     Allergies   Patient has no known allergies.   Review of Systems Review of Systems  Constitutional: Negative for fever.  Eyes: Negative for visual disturbance.  Respiratory: Positive for shortness of breath.   Cardiovascular: Positive for chest pain.  Gastrointestinal: Positive for nausea. Negative for abdominal pain and vomiting.  Neurological:  Positive for headaches. Negative for weakness and numbness.  All other systems reviewed and are negative.    Physical Exam Updated Vital Signs BP 103/64   Pulse (!) 57   Temp 97.9 F (36.6 C) (Oral)   Resp (!) 22   Ht 5\' 11"  (1.803 m)   Wt 93 kg   SpO2 97%   BMI 28.59 kg/m   Physical Exam Vitals signs and nursing note reviewed.  Constitutional:      General: He is not in acute distress.    Appearance: He is well-developed. He is not ill-appearing, toxic-appearing or diaphoretic.  HENT:     Head: Normocephalic and atraumatic.     Comments: No scalp tenderness, especially over right temple    Right Ear: External ear normal.     Left Ear: External ear normal.     Nose: Nose normal.  Eyes:     General:        Right eye: No discharge.        Left eye: No discharge.     Extraocular Movements: Extraocular movements intact.     Pupils: Pupils are equal, round, and reactive to light.  Neck:     Musculoskeletal: Normal range of motion and neck supple. No neck rigidity.  Cardiovascular:     Rate and Rhythm: Regular rhythm. Bradycardia present.     Heart sounds: Normal heart sounds.  Pulmonary:     Effort: Pulmonary effort is normal.     Breath sounds: Normal breath sounds.  Abdominal:     Palpations: Abdomen is soft.     Tenderness: There is no abdominal tenderness.  Skin:    General: Skin is warm and dry.  Neurological:     Mental Status: He is alert.     Comments: CN 3-12 grossly intact. 5/5 strength in all 4 extremities. Grossly normal sensation. Normal finger to nose.   Psychiatric:        Mood and Affect: Mood is not anxious.      ED Treatments / Results  Labs (all labs ordered are listed, but only abnormal results are displayed) Labs Reviewed  BASIC METABOLIC PANEL - Abnormal; Notable for the following components:      Result Value   BUN 24 (*)    All other components within normal limits  CBC  TROPONIN I  BRAIN NATRIURETIC PEPTIDE  SEDIMENTATION RATE     EKG EKG Interpretation  Date/Time:  Wednesday August 21 2018 12:28:37 EST Ventricular Rate:  50 PR Interval:  156 QRS Duration: 102 QT Interval:  442 QTC Calculation: 402 R Axis:   10 Text Interpretation:  Sinus bradycardia Incomplete right bundle branch block Possible Anteroseptal infarct , age undetermined Abnormal ECG anterior T wave inversions new since Aug 2016 Confirmed by Sherwood Gambler [phone]) on 08/21/2018 12:59:24 PM   EKG Interpretation  Date/Time:  Wednesday August 21 2018 14:53:26 EST Ventricular Rate:  58 PR Interval:  156 QRS Duration: 104 QT Interval:  427 QTC Calculation: 420 R Axis:   -28 Text Interpretation:  Sinus rhythm Borderline left axis deviation Abnormal R-wave progression, late transition Baseline wander in lead(s) II III aVF T wave changes no longer present V2-3 Confirmed by Sherwood Gambler [phone]) on 08/21/2018 3:08:17 PM        Radiology Dg Chest 2 View  Result Date: 08/21/2018 CLINICAL DATA:  Chest pain, sob, nausea, and headache intermittently for over a week. Was seen and d/c from [facility] last week with same s/s. Hx COPD, HTN, DM, pulm embolism. Smoker. EXAM: CHEST - 2 VIEW COMPARISON:  07/25/2018 and older exams. FINDINGS: Cardiac silhouette is top-normal in size. There is a smooth concave soft tissue bulge from the right superior mediastinum, most likely vascular, but more prominent than on prior exams. There is no deviation or mass effect on the trachea. Mediastinum otherwise normal in contour. Main pulmonary arteries appear enlarged. There are thickened bronchovascular markings bilaterally stable from the prior exam. No evidence of pneumonia or pulmonary edema. No pleural effusion or pneumothorax. Skeletal structures are intact. IMPRESSION: 1. No acute cardiopulmonary disease. 2. Chronically thickened bronchovascular markings and enlarged pulmonary arteries, the latter suggesting pulmonary hypertension. 3. Soft tissue contour bulge  along the right superior mediastinum that is most likely vascular. This could be further assessed with chest CT. Electronically Signed   By: Lajean Manes M.D.   On: 08/21/2018 12:56   Ct Head Wo Contrast  Result Date: 08/21/2018 CLINICAL DATA:  Chest pain, sob and headache intermittently for over a week. Was seen and d/c from [facility] last week with same s/s. Has not seen pcp, has an appointment on Friday. EXAM: CT HEAD WITHOUT CONTRAST TECHNIQUE: Contiguous axial images were obtained from the base of the skull through the vertex without intravenous contrast. COMPARISON:  None. FINDINGS: Brain: No evidence of acute infarction, hemorrhage, hydrocephalus, extra-axial collection or mass lesion/mass effect. There are mild areas of white matter hypoattenuation consistent with chronic microvascular ischemic change. Vascular: No hyperdense vessel or unexpected calcification. Skull: Normal. Negative for fracture or focal lesion. Sinuses/Orbits: Globes and orbits are unremarkable. The sinuses and mastoid air cells are clear. Other: None. IMPRESSION: 1. No acute intracranial abnormalities. 2. Mild chronic microvascular ischemic change. Electronically Signed   By: Lajean Manes M.D.   On: 08/21/2018 14:43   Ct Angio Chest Pe W And/or Wo Contrast  Result Date: 08/21/2018 CLINICAL DATA:  One-week history of intermittent chest pain, shortness of breath and headaches. EXAM: CT ANGIOGRAPHY CHEST WITH CONTRAST TECHNIQUE: Multidetector CT imaging of the chest was performed using the standard protocol during bolus administration of intravenous contrast. Multiplanar CT image reconstructions and MIPs were obtained to evaluate the vascular anatomy. CONTRAST:  17mL ISOVUE-370 IOPAMIDOL INJECTION 76% IV. COMPARISON:  CT chest 01/06/2010. FINDINGS: Cardiovascular: Contrast opacification of the pulmonary arterial system and the systemic arterial system good and equivalent. No filling defects within either main pulmonary  artery or their segmental branches in either lung to suggest pulmonary embolism. Heart moderately enlarged. Dense mitral valvular calcifications. Moderate to severe three-vessel coronary atherosclerosis. No pericardial effusion. Moderate atherosclerosis involving the thoracic and proximal abdominal aorta. Ascending thoracic aortic  aneurysm measuring up to approximately 4.6 cm. Atherosclerosis at the origin of the great vessels without evidence of stenosis. Direct origin of the LEFT vertebral artery from the aortic arch. Severe narrowing of the proximal and mid SVC by massive lymphadenopathy which will be detailed below. Numerous collaterals are present in the RIGHT UPPER chest and back (contrast injection was performed in the RIGHT UPPER extremity). Mediastinum/Nodes: Massive mediastinal, BILATERAL hilar and BILATERAL axillary lymphadenopathy. Enlarged lymph nodes are present at all 3 levels of both the RIGHT and LEFT axilla. BILATERAL supraclavicular lymphadenopathy is also present. Index conglomerate RIGHT UPPER paratracheal lymph nodes (station 2R) measure approximately 8.8 x 4.7 cm (series 5, image 23). Conglomerate RIGHT axillary lymph nodes measure approximately 4.9 x 4.3 cm (image 48). Conglomerate low LEFT axillary lymph nodes measure approximately 6.4 x 3.4 cm (image 53). Conglomerate subcarinal mediastinal (station 7 lymphadenopathy measures approximately 3.4 x 7.9 cm (image 54). Conglomerate RIGHT axillary lymph nodes measure approximately 5.1 x 6.1 cm (image 14).Index conglomerate LEFT axillary nodal mass measures approximately 4.1 x 7.8 cm on coronal image 71. Pericardial/duplication cyst adjacent to the RIGHT atrium in the LOWER mediastinum measures approximately 4.6 x 2.9 cm, slightly increased in size since the prior CT. Ormal appearing esophagus which I suspect is compressed by the massive mediastinal lymphadenopathy. Normal-appearing thyroid gland. Lungs/Pleura: Mild emphysematous changes  throughout both lungs. Tree in bud nodular opacities in the RIGHT MIDDLE LOBE. Lungs otherwise clear. No confluent airspace consolidation. No evidence of interstitial lung disease. Subpleural lipoma involving the LEFT LATERAL chest wall. No pleural effusions. Central airways patent with moderate to severe bronchial wall thickening. The LEFT UPPER LOBE and LEFT LOWER LOBE bronchi are compressed by the bulky lymphadenopathy. The RIGHT lobar bronchi do not appear significantly compressed. Upper Abdomen: Approximate 1.5 x 1.6 cm mass in the fat adjacent to the RIGHT lobe of the liver (image 95). Normal sized celiac axis and gastrohepatic ligament lymph nodes; no pathologic lymphadenopathy in the upper abdomen. No retrocrural lymphadenopathy. At least 3 accessory splenules in the [place]. Mild pancreatic atrophy. Surgically absent gallbladder. Musculoskeletal: Mild-to-moderate diffuse thoracic spine degenerative disc disease and spondylosis. Degenerative disc disease and spondylosis involving the visualized LOWER cervical spine. No acute findings. No evidence of osseous metastatic disease. Review of the MIP images confirms the above findings. IMPRESSION: 1. Bulky mediastinal, BILATERAL hilar and BILATERAL axillary lymphadenopathy, with index conglomerate nodal masses measured above. Lymphoma is suspected, possibly Hodgkin's lymphoma. 2. No evidence of pulmonary embolism. 3. 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 4. Approximate 1.6 cm mass involving the fat adjacent to the RIGHT lobe of the liver. No evidence of pathologic lymphadenopathy in the visualized upper abdomen. 5. Tree in bud opacities in the RIGHT MIDDLE LOBE, likely an acute inflammatory process. Severe central bronchial wall thickening indicates asthma and/or bronchitis. No acute cardiopulmonary disease otherwise. 6. Benign pericardial/duplication cyst adjacent to the RIGHT atrium. 7.  Ascending thoracic aortic aneurysm measuring approximately 4.6 cm diameter. Recommend semi-annual imaging followup by CTA or MRA and referral to cardiothoracic surgery if not already obtained. This recommendation follows 2010 ACCF/AHA/AATS/ACR/ASA/SCA/SCAI/SIR/STS/SVM Guidelines for the Diagnosis and Management of Patients With Thoracic Aortic Disease. Circulation. 2010; 121: G921-J941. 8. Moderate cardiomegaly. Dense aortic valvular calcifications. Moderate three-vessel coronary atherosclerosis. Aortic Atherosclerosis (ICD10-I70.0) and Emphysema (ICD10-J43.9). Aortic aneurysm NOS (ICD10-I71.9). Electronically Signed   By: Evangeline Dakin M.D.   On: 08/21/2018 15:09    Procedures Procedures (including critical care time)  Medications Ordered in ED Medications  aspirin chewable tablet 324 mg (has no administration in time range)  sodium chloride flush (NS) 0.9 % injection 3 mL (3 mLs Intravenous Given 08/21/18 1308)  sodium chloride 0.9 % bolus 500 mL (0 mLs Intravenous Stopped 08/21/18 1455)  metoCLOPramide (REGLAN) injection 5 mg (5 mg Intravenous Given 08/21/18 1338)  diphenhydrAMINE (BENADRYL) injection 12.5 mg (12.5 mg Intravenous Given 08/21/18 1339)  iopamidol (ISOVUE-370) 76 % injection 150 mL (100 mLs Intravenous Contrast Given 08/21/18 1418)     Initial Impression / Assessment and Plan / ED Course  I have reviewed the triage vital signs and the nursing notes.  Pertinent labs & imaging results that were available during my care of the patient were reviewed by me and considered in my medical decision making (see chart for details).     After dose of Reglan and Benadryl, patient symptoms have improved.  Initial ECG with some nonspecific anterior T wave changes.  These are new since 2016.  After review of his paperwork from the outside hospital, CT obtained to rule out PE given that he feels similar to when he was admitted there and diagnosed with a small PE.  This does not show PE but does  show significant lymphadenopathy.  He already knows about this and had a biopsy of 1 of his axillary lymph nodes.  This is currently pending.  However with some mild ECG changes throughout his ED stay in association to some atherosclerosis seen on the CT in his coronary vessels, I think is reasonable to admit for ACS work-up/rule out.  Initial troponin negative.  His chest pain is better at this time, which correlates with his ECG showing improvement.  Admit to the hospitalist service.  Final Clinical Impressions(s) / ED Diagnoses   Final diagnoses:  Nonspecific chest pain    ED Discharge Orders    None       Sherwood Gambler, MD 08/21/18 1550

## 2018-08-21 NOTE — Progress Notes (Addendum)
Pt has arrived from [facility] via [facility], pt is nauseated & had obtained Zofran 4mg  enroute. Pt c/o R frontal headache that developed after transferring from stretcher to hosp bed.  Telemetry attached-central tele called within 10 mins  to report  HR = 28-low 30's with quick return to the mid 50s. Pt asymptomatic except for nausea & headache.

## 2018-08-21 NOTE — ED Notes (Signed)
Patient given meal tray.

## 2018-08-21 NOTE — Progress Notes (Signed)
  Radiation Oncology         [phone] ________________________________  Name: [name] MRN: [id number]  Date: 08/21/2018  DOB: 30-Sep-1945  I just spoke with Dr. Denton Brick about this patient. He has a mediastinal mass, intermittent respiratory distress, currently breathing room air.   Lymphoma is in differential.... tissue biopsy results pending.  MedOnc also has been consulted.  He's getting transferred to [facility] from [facility] this evening.  I have alerted our consulting team to see him in the AM.    Timing of radiotherapy may depend on tissue results - chemotherapy may need to precede RT, such as if this ends up being lymphoma.Eppie Gibson, MD

## 2018-08-21 NOTE — Progress Notes (Signed)
Pt more relaxed now resting on his side, HR in 50s , coughs with a deep breath, O2 applied at 2lnc d/t O2 sats 89%

## 2018-08-21 NOTE — ED Notes (Signed)
Report given to [facility]. 

## 2018-08-22 ENCOUNTER — Ambulatory Visit
Admit: 2018-08-22 | Discharge: 2018-08-22 | Disposition: A | Discharge status: Home / Self Care | Payer: Medicare Other | Source: Ambulatory Visit | Attending: Radiation Oncology | Admitting: Radiation Oncology

## 2018-08-22 ENCOUNTER — Observation Stay (HOSPITAL_COMMUNITY): Payer: Medicare Other

## 2018-08-22 ENCOUNTER — Ambulatory Visit
Admit: 2018-08-22 | Discharge: 2018-08-22 | Disposition: A | Discharge status: Home / Self Care | Payer: Medicare Other | Attending: Radiation Oncology | Admitting: Radiation Oncology

## 2018-08-22 ENCOUNTER — Other Ambulatory Visit: Payer: Self-pay

## 2018-08-22 ENCOUNTER — Observation Stay (HOSPITAL_BASED_OUTPATIENT_CLINIC_OR_DEPARTMENT_OTHER): Payer: Medicare Other

## 2018-08-22 DIAGNOSIS — E119 Type 2 diabetes mellitus without complications: Secondary | ICD-10-CM | POA: Diagnosis not present

## 2018-08-22 DIAGNOSIS — I2699 Other pulmonary embolism without acute cor pulmonale: Secondary | ICD-10-CM

## 2018-08-22 DIAGNOSIS — J449 Chronic obstructive pulmonary disease, unspecified: Secondary | ICD-10-CM | POA: Diagnosis not present

## 2018-08-22 DIAGNOSIS — C3490 Malignant neoplasm of unspecified part of unspecified bronchus or lung: Secondary | ICD-10-CM | POA: Diagnosis not present

## 2018-08-22 DIAGNOSIS — C771 Secondary and unspecified malignant neoplasm of intrathoracic lymph nodes: Secondary | ICD-10-CM

## 2018-08-22 DIAGNOSIS — I871 Compression of vein: Secondary | ICD-10-CM

## 2018-08-22 DIAGNOSIS — Z87891 Personal history of nicotine dependence: Secondary | ICD-10-CM

## 2018-08-22 DIAGNOSIS — Z79899 Other long term (current) drug therapy: Secondary | ICD-10-CM

## 2018-08-22 DIAGNOSIS — Z510 Encounter for antineoplastic radiation therapy: Secondary | ICD-10-CM | POA: Insufficient documentation

## 2018-08-22 DIAGNOSIS — I10 Essential (primary) hypertension: Secondary | ICD-10-CM | POA: Diagnosis not present

## 2018-08-22 DIAGNOSIS — C3491 Malignant neoplasm of unspecified part of right bronchus or lung: Principal | ICD-10-CM

## 2018-08-22 DIAGNOSIS — Z7189 Other specified counseling: Secondary | ICD-10-CM | POA: Insufficient documentation

## 2018-08-22 LAB — COMPREHENSIVE METABOLIC PANEL
ALT: 52 U/L — ABNORMAL HIGH (ref 0–44)
AST: 39 U/L (ref 15–41)
Albumin: 3.6 g/dL (ref 3.5–5.0)
Alkaline Phosphatase: 67 U/L (ref 38–126)
Anion gap: 7 (ref 5–15)
BUN: 19 mg/dL (ref 8–23)
CO2: 25 mmol/L (ref 22–32)
Calcium: 8.7 mg/dL — ABNORMAL LOW (ref 8.9–10.3)
Chloride: 107 mmol/L (ref 98–111)
Creatinine, Ser: 0.77 mg/dL (ref 0.61–1.24)
GFR calc Af Amer: >60 mL/min (ref >60)
GFR calc non Af Amer: >60 mL/min (ref >60)
GLUCOSE: 124 mg/dL — AB (ref 70–99)
Potassium: 3.7 mmol/L (ref 3.5–5.1)
SODIUM: 139 mmol/L (ref 135–145)
Total Bilirubin: 1 mg/dL (ref 0.3–1.2)
Total Protein: 6.3 g/dL — ABNORMAL LOW (ref 6.5–8.1)

## 2018-08-22 LAB — CBC
HCT: 40.7 % (ref 39.0–52.0)
Hemoglobin: 13.1 g/dL (ref 13.0–17.0)
MCH: 30.6 pg (ref 26.0–34.0)
MCHC: 32.2 g/dL (ref 30.0–36.0)
MCV: 95.1 fL (ref 80.0–100.0)
NRBC: 0 % (ref 0.0–0.2)
PLATELETS: 309 10*3/uL (ref 150–400)
RBC: 4.28 MIL/uL (ref 4.22–5.81)
RDW: 13.6 % (ref 11.5–15.5)
WBC: 10.9 10*3/uL — ABNORMAL HIGH (ref 4.0–10.5)

## 2018-08-22 LAB — GLUCOSE, CAPILLARY
Glucose-Capillary: 88 mg/dL (ref 70–99)
Glucose-Capillary: 121 mg/dL — ABNORMAL HIGH (ref 70–99)
Glucose-Capillary: 102 mg/dL — ABNORMAL HIGH (ref 70–99)
Glucose-Capillary: 140 mg/dL — ABNORMAL HIGH (ref 70–99)

## 2018-08-22 MED ORDER — GADOBUTROL 1 MMOL/ML IV SOLN
9.0000 mL | Freq: Once | INTRAVENOUS | Status: AC | PRN
  Administered 2018-08-22: 9 mL via INTRAVENOUS

## 2018-08-22 MED ORDER — IPRATROPIUM-ALBUTEROL 0.5-2.5 (3) MG/3ML IN SOLN
3.0000 mL | Freq: Four times a day (QID) | RESPIRATORY_TRACT | Status: DC
  Administered 2018-08-22 – 2018-08-23 (×4): 3 mL via RESPIRATORY_TRACT
  Filled 2018-08-22 (×5): qty 3

## 2018-08-22 MED ORDER — TBO-FILGRASTIM 300 MCG/0.5ML ~~LOC~~ SOSY
300.0000 ug | PREFILLED_SYRINGE | Freq: Every day | SUBCUTANEOUS | Status: DC
  Filled 2018-08-22: qty 0.5

## 2018-08-22 NOTE — Progress Notes (Signed)
PROGRESS NOTE    SAMIK BALKCOM  YIR:[id number] DOB: 1946-03-24 DOA: 08/21/2018 PCP: Lucia Gaskins, MD   Brief Narrative:  MASEN LUALLEN is Zhara Gieske 73 y.o. male with medical history significant for HTN, DM, COPD, who presented to the ED with complaints of intermittent episodes of difficulty breathing and chest pain involving his upper chest from above his axilla towards his neck.  Over the past 3 weeks he has had 2 syncopal episodes and 2 near syncopal episodes last episode was this morning.  Difficulty breathing and chest pain is associated with headache and diaphoresis.  No aggravating or relieving factors.  He also feels like his throat is closing up and his chest is congested.  Patient also reports Marilynne Dupuis mass in his right axilla, noticed about 2 to 3 months ago, sister reports within the past week the size of the mass has doubled.  He denies obvious swelling of his face or neck. He is not on home O2.  He also reports productive cough.  No fevers or chills. Patient was recently admitted at [facility], 1/9- 08/24/18 for similar symptoms, after another episode of nearly passing out.  Patient was diagnosed with Aris Moman small pulmonary embolism and started on Eliquis he also had an FNA done-sample was taken from his right axilla.  Results of which are still pending.  He also had work-up for syncope-ultrasound of his heart and neck. He was supposed to follow-up with an oncologist at [facility] oncology center 08/26/17.   ED Course: Blood pressure systolic 50-093.  On room air.  Chest x-ray-chronically thickened bronchovascular markings and enlarged pulmonary arteries suggesting pulmonary hypertension, soft tissue control both right superior mediastinum, chest CT recommended.  This point CTA shows bulky mediastinal bilateral hilar and bilateral axillary lymphadenopathy, possibly Hodgkin's lymphoma.  No PE.  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  Initial EKG showed T wave inversion in V2, not evident on subsequent EKGs.  Troponin normal.  Patient was given aspirin 500 mill bolus.  Hospitalist to admit for chest pain.  Assessment & Plan:   Active Problems:   SVC syndrome   Thoracic ascending aortic aneurysm (HCC)   Lymphadenopathy   Bulky lymphadenopathy with SVC syndrome  Metastatic Small Cell Lung Cancer:   - CT scan with bulky mediastinal, bilateral hilar, bilateral axillary lymphadenopathy with severe stenosis involving upper and mid SVC due to compression by mediastinal nodal mass.  Also with 1.6 cm mass adjacent to R lobe of liver (see report) - Radiation oncology has been consulted, appreciate recs - planning to proceed with radiation therapy - plan for 2 weeks of radiotherapy - Oncology consulted, appreciate recs, planning for chemotherapy in house with day 1 on Jan 17 - Follow up CT abdomen pelvis and MRI of brain for staging - Plan is for him to f/u at [facility] after hospitalization - SLP  Pulmonary embolism  -recent diagnosis at [facility].  CTA chest here without PE (discussed with rads who agreed, no PE on our imaging).  Will stop anticoagulaton, follow UE and LE ultrasounds (no DVT in upper or lower extremity, did have SVT in R upper extremity in cephalic vein).    COPD -between episodes of chest pain and difficulty breathing patient's has no difficulty breathing.  But has Dorthy Hustead productive cough.  No wheezing on exam.  Chest CT with tree in bud opacities in R middle lobe and severe central bronchial wall thickening suggesting asthma and/or bronchitis.  Patient was treated with steroids and antibiotics azithromycin at [facility].  Shortness of breath with mild 2 L O2 requirement at that time was attributed to bronchitis and small PE.  Pt appears stable at this time without  wheezing on exam. -Duo nebs scheduled and PRN -Mucolytics  Ascending thoracic aortic aneurysm- incidental finding on chest CTA measuring approximately 4.6 cm.  Semi-and well follow-up with CTA or MRA and referral to cardiothoracic surgery recommended. - F/u as outpt  HTN-systolic blood pressure 16-109.  -Hold home verapamil, telmisartan HCTZ  DM-glucose 92. -Hold home metformin - SSI  DVT prophylaxis: SCD Code Status: DNR Family Communication: children, grandchildren at bedside Disposition Plan: Pt with metastatic small cell and SVC syndrome requiring chemo/radiation therapy.  On discussion with radiation oncology, given concern for possibility of rebound respiratory distress in these patients, recommending to keep pt hospitalized to follow response after 3 fractions.  Anticipating stay until early next week.  Pt will require inpatient treatment given need for close monitoring with initiation of radiation therapy.  Consultants:   Rad onc  oncology  Procedures:  LE [place] Summary: Right: There is no evidence of deep vein thrombosis in the lower extremity. No cystic structure found in the popliteal fossa. Left: There is no evidence of deep vein thrombosis in the lower extremity. No cystic structure found in the popliteal fossa.  UE [place] Right: No evidence of deep vein thrombosis in the upper extremity. Findings consistent with acute superficial vein thrombosis involving the right cephalic vein.   Left: No evidence of deep vein thrombosis in the upper extremity. No evidence of superficial vein thrombosis in the upper extremity.     Antimicrobials:  Anti-infectives (From admission, onward)   None         Subjective: Notes spells of HA, nausea, burning across chest.  Doing ok right now.  Objective: Vitals:   08/22/18 0629 08/22/18 1135 08/22/18 1946 08/22/18 2000  BP: 111/75   (!) 98/56  Pulse: 65   70  Resp: 20   (!) 25  Temp: 98.4 F (36.9 C)   99 F (37.2 C)    TempSrc: Oral   Oral  SpO2: 97% 95% 92% 94%  Weight:      Height:        Intake/Output Summary (Last 24 hours) at 08/22/2018 2047 Last data filed at 08/22/2018 1500 Gross per 24 hour  Intake 959.35 ml  Output -  Net 959.35 ml   Filed Weights   08/21/18 1230  Weight: 93 kg    Examination:  General exam: Appears calm and comfortable  Respiratory system: Clear to auscultation. Respiratory effort normal. Cardiovascular system: S1 & S2 heard, RRR. Gastrointestinal system: Abdomen is nondistended, soft and nontender. Central nervous system: Alert and oriented. No focal neurological deficits. Extremities: no upper extremity or LE edema Skin: No rashes, lesions or ulcers Psychiatry: Judgement and insight appear normal. Mood & affect appropriate.     Data Reviewed: I have personally reviewed following labs and imaging studies  CBC: Recent Labs  Lab 08/21/18 1254 08/22/18 1234  WBC 9.8 10.9*  HGB 13.3 13.1  HCT 41.1 40.7  MCV 94.3 95.1  PLT 315 604   Basic Metabolic Panel: Recent Labs  Lab 08/21/18 1254 08/22/18 1234  NA 138 139  K 4.0 3.7  CL 100 107  CO2 28 25  GLUCOSE 92 124*  BUN 24* 19  CREATININE 0.93 0.77  CALCIUM 9.4 8.7*   GFR: Estimated Creatinine Clearance: 97.3 mL/min (by C-G formula based on SCr of 0.77 mg/dL). Liver Function Tests: Recent Labs  Lab 08/22/18 1234  AST 39  ALT 52*  ALKPHOS 67  BILITOT 1.0  PROT 6.3*  ALBUMIN 3.6   No results for input(s): LIPASE, AMYLASE in the last 168 hours. No results for input(s): AMMONIA in the last 168 hours. Coagulation Profile: No results for input(s): INR, PROTIME in the last 168 hours. Cardiac Enzymes: Recent Labs  Lab 08/21/18 1254  TROPONINI <0.03   BNP (last 3 results) No results for input(s): PROBNP in the last 8760 hours. HbA1C: No results for input(s): HGBA1C in the last 72 hours. CBG: Recent Labs  Lab 08/22/18 0741 08/22/18 1245 08/22/18 1825 08/22/18 2019  GLUCAP 88 121*  102* 140*   Lipid Profile: No results for input(s): CHOL, HDL, LDLCALC, TRIG, CHOLHDL, LDLDIRECT in the last 72 hours. Thyroid Function Tests: No results for input(s): TSH, T4TOTAL, FREET4, T3FREE, THYROIDAB in the last 72 hours. Anemia Panel: No results for input(s): VITAMINB12, FOLATE, FERRITIN, TIBC, IRON, RETICCTPCT in the last 72 hours. Sepsis Labs: No results for input(s): PROCALCITON, LATICACIDVEN in the last 168 hours.  No results found for this or any previous visit (from the past 240 hour(s)).       Radiology Studies: Dg Chest 2 View  Result Date: 08/21/2018 CLINICAL DATA:  Chest pain, sob, nausea, and headache intermittently for over [place] week. Was seen and d/c from [facility] last week with same s/s. Hx COPD, HTN, DM, pulm embolism. Smoker. EXAM: CHEST - 2 VIEW COMPARISON:  07/25/2018 and older exams. FINDINGS: Cardiac silhouette is top-normal in size. There is Barri Neidlinger smooth concave soft tissue bulge from the right superior mediastinum, most likely vascular, but more prominent than on prior exams. There is no deviation or mass effect on the trachea. Mediastinum otherwise normal in contour. Main pulmonary arteries appear enlarged. There are thickened bronchovascular markings bilaterally stable from the prior exam. No evidence of pneumonia or pulmonary edema. No pleural effusion or pneumothorax. Skeletal structures are intact. IMPRESSION: 1. No acute cardiopulmonary disease. 2. Chronically thickened bronchovascular markings and enlarged pulmonary arteries, the latter suggesting pulmonary hypertension. 3. Soft tissue contour bulge along the right superior mediastinum that is most likely vascular. This could be further assessed with chest CT. Electronically Signed   By: Lajean Manes M.D.   On: 08/21/2018 12:56   Ct Head Wo Contrast  Result Date: 08/21/2018 CLINICAL DATA:  Chest pain, sob and headache intermittently for over [place] week. Was seen and d/c from [facility] last week  with same s/s. Has not seen pcp, has an appointment on Friday. EXAM: CT HEAD WITHOUT CONTRAST TECHNIQUE: Contiguous axial images were obtained from the base of the skull through the vertex without intravenous contrast. COMPARISON:  None. FINDINGS: Brain: No evidence of acute infarction, hemorrhage, hydrocephalus, extra-axial collection or mass lesion/mass effect. There are mild areas of white matter hypoattenuation consistent with chronic microvascular ischemic change. Vascular: No hyperdense vessel or unexpected calcification. Skull: Normal. Negative for fracture or focal lesion. Sinuses/Orbits: Globes and orbits are unremarkable. The sinuses and mastoid air cells are clear. Other: None. IMPRESSION: 1. No acute intracranial abnormalities. 2. Mild chronic microvascular ischemic change. Electronically Signed   By: Lajean Manes M.D.   On: 08/21/2018 14:43   Ct Angio Chest Pe W And/or Wo Contrast  Result Date: 08/21/2018 CLINICAL DATA:  One-week history  of intermittent chest pain, shortness of breath and headaches. EXAM: CT ANGIOGRAPHY CHEST WITH CONTRAST TECHNIQUE: Multidetector CT imaging of the chest was performed using the standard protocol during bolus administration of intravenous contrast. Multiplanar CT image reconstructions and MIPs were obtained to evaluate the vascular anatomy. CONTRAST:  156mL ISOVUE-370 IOPAMIDOL INJECTION 76% IV. COMPARISON:  CT chest 01/06/2010. FINDINGS: Cardiovascular: Contrast opacification of the pulmonary arterial system and the systemic arterial system good and equivalent. No filling defects within either main pulmonary artery or their segmental branches in either lung to suggest pulmonary embolism. Heart moderately enlarged. Dense mitral valvular calcifications. Moderate to severe three-vessel coronary atherosclerosis. No pericardial effusion. Moderate atherosclerosis involving the thoracic and proximal abdominal aorta. Ascending thoracic aortic aneurysm measuring up to  approximately 4.6 cm. Atherosclerosis at the origin of the great vessels without evidence of stenosis. Direct origin of the LEFT vertebral artery from the aortic arch. Severe narrowing of the proximal and mid SVC by massive lymphadenopathy which will be detailed below. Numerous collaterals are present in the RIGHT UPPER chest and back (contrast injection was performed in the RIGHT UPPER extremity). Mediastinum/Nodes: Massive mediastinal, BILATERAL hilar and BILATERAL axillary lymphadenopathy. Enlarged lymph nodes are present at all 3 levels of both the RIGHT and LEFT axilla. BILATERAL supraclavicular lymphadenopathy is also present. Index conglomerate RIGHT UPPER paratracheal lymph nodes (station 2R) measure approximately 8.8 x 4.7 cm (series 5, image 23). Conglomerate RIGHT axillary lymph nodes measure approximately 4.9 x 4.3 cm (image 48). Conglomerate low LEFT axillary lymph nodes measure approximately 6.4 x 3.4 cm (image 53). Conglomerate subcarinal mediastinal (station 7 lymphadenopathy measures approximately 3.4 x 7.9 cm (image 54). Conglomerate RIGHT axillary lymph nodes measure approximately 5.1 x 6.1 cm (image 14).Index conglomerate LEFT axillary nodal mass measures approximately 4.1 x 7.8 cm on coronal image 71. Pericardial/duplication cyst adjacent to the RIGHT atrium in the LOWER mediastinum measures approximately 4.6 x 2.9 cm, slightly increased in size since the prior CT. Ormal appearing esophagus which I suspect is compressed by the massive mediastinal lymphadenopathy. Normal-appearing thyroid gland. Lungs/Pleura: Mild emphysematous changes throughout both lungs. Tree in bud nodular opacities in the RIGHT MIDDLE LOBE. Lungs otherwise clear. No confluent airspace consolidation. No evidence of interstitial lung disease. Subpleural lipoma involving the LEFT LATERAL chest wall. No pleural effusions. Central airways patent with moderate to severe bronchial wall thickening. The LEFT UPPER LOBE and LEFT  LOWER LOBE bronchi are compressed by the bulky lymphadenopathy. The RIGHT lobar bronchi do not appear significantly compressed. Upper Abdomen: Approximate 1.5 x 1.6 cm mass in the fat adjacent to the RIGHT lobe of the liver (image 95). Normal sized celiac axis and gastrohepatic ligament lymph nodes; no pathologic lymphadenopathy in the upper abdomen. No retrocrural lymphadenopathy. At least 3 accessory splenules in the [place]. Mild pancreatic atrophy. Surgically absent gallbladder. Musculoskeletal: Mild-to-moderate diffuse thoracic spine degenerative disc disease and spondylosis. Degenerative disc disease and spondylosis involving the visualized LOWER cervical spine. No acute findings. No evidence of osseous metastatic disease. Review of the MIP images confirms the above findings. IMPRESSION: 1. Bulky mediastinal, BILATERAL hilar and BILATERAL axillary lymphadenopathy, with index conglomerate nodal masses measured above. Lymphoma is suspected, possibly Hodgkin's lymphoma. 2. No evidence of pulmonary embolism. 3. 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 4. Approximate 1.6 cm mass involving the fat adjacent to the RIGHT lobe of the liver. No evidence of pathologic lymphadenopathy in the visualized upper abdomen. 5. Tree in bud opacities in  the RIGHT MIDDLE LOBE, likely an acute inflammatory process. Severe central bronchial wall thickening indicates asthma and/or bronchitis. No acute cardiopulmonary disease otherwise. 6. Benign pericardial/duplication cyst adjacent to the RIGHT atrium. 7. Ascending thoracic aortic aneurysm measuring approximately 4.6 cm diameter. Recommend semi-annual imaging followup by CTA or MRA and referral to cardiothoracic surgery if not already obtained. This recommendation follows 2010 ACCF/AHA/AATS/ACR/ASA/SCA/SCAI/SIR/STS/SVM Guidelines for the Diagnosis and Management of Patients With Thoracic Aortic Disease. Circulation.  2010; 121: [id number]. 8. Moderate cardiomegaly. Dense aortic valvular calcifications. Moderate three-vessel coronary atherosclerosis. Aortic Atherosclerosis (ICD10-I70.0) and Emphysema (ICD10-J43.9). Aortic aneurysm NOS (ICD10-I71.9). Electronically Signed   By: Evangeline Dakin M.D.   On: 08/21/2018 15:09   Mr Jeri Cos VQ Contrast  Result Date: 08/22/2018 CLINICAL DATA:  73 y/o  M; small cell lung cancer staging. EXAM: MRI HEAD WITHOUT AND WITH CONTRAST TECHNIQUE: Multiplanar, multiecho pulse sequences of the brain and surrounding structures were obtained without and with intravenous contrast. CONTRAST:  9 cc Gadavist COMPARISON:  08/21/2018 CT head. FINDINGS: Brain: 3 subcentimeter lesions within the brain demonstrating diffusion hyperintensity in the right external capsule, right occipital lobe, and right cerebellar hemisphere (series 3 image 14, 20, 25). The lesions within the right cerebellar hemisphere and the right external capsule demonstrate enhancement best appreciated on the sagittal sequence (series 13, image 7 and 9). The right occipital lesion does not demonstrate appreciable enhancement, however, is clearly visible on the T1 noncontrast sequences (series 9 image 20). Mild edema is associated with the right cerebellar lesion. No additional lesion in the brain is identified. Background of punctate nonspecific T2 FLAIR hyperintensities in subcortical and periventricular white matter are compatible with mild chronic microvascular ischemic changes for age. Mild volume loss of the brain. No stroke, hemorrhage, extra-axial collection, hydrocephalus, or significant mass effect. Vascular: Normal flow voids. Skull and upper cervical spine: Normal marrow signal. Sinuses/Orbits: Negative. Other: None. IMPRESSION: 1. 3 small metastasis are present in the right insula, right occipital lobe, and right cerebellar hemisphere. 2. No hemorrhage, mass effect, or stroke identified. 3. Mild for age chronic microvascular  ischemic changes and volume loss of the brain. Electronically Signed   By: Kristine Garbe M.D.   On: 08/22/2018 14:42   Vas [place] Lower Extremity Venous (dvt)  Result Date: 08/22/2018  Lower Venous Study Indications: History of PE.  Performing Technologist: Oliver Hum RVT  Examination Guidelines: Arali Somera complete evaluation includes B-mode imaging, spectral Doppler, color Doppler, and power Doppler as needed of all accessible portions of each vessel. Bilateral testing is considered an integral part of Branae Crail complete examination. Limited examinations for reoccurring indications may be performed as noted.  Right Venous Findings: +---------+---------------+---------+-----------+----------+-------+          CompressibilityPhasicitySpontaneityPropertiesSummary +---------+---------------+---------+-----------+----------+-------+ CFV      Full           Yes      Yes                          +---------+---------------+---------+-----------+----------+-------+ SFJ      Full                                                 +---------+---------------+---------+-----------+----------+-------+ FV Prox  Full                                                 +---------+---------------+---------+-----------+----------+-------+  FV Mid   Full                                                 +---------+---------------+---------+-----------+----------+-------+ FV DistalFull                                                 +---------+---------------+---------+-----------+----------+-------+ PFV      Full                                                 +---------+---------------+---------+-----------+----------+-------+ POP      Full           Yes      Yes                          +---------+---------------+---------+-----------+----------+-------+ PTV      Full                                                  +---------+---------------+---------+-----------+----------+-------+ PERO     Full                                                 +---------+---------------+---------+-----------+----------+-------+  Left Venous Findings: +---------+---------------+---------+-----------+----------+-------+          CompressibilityPhasicitySpontaneityPropertiesSummary +---------+---------------+---------+-----------+----------+-------+ CFV      Full           Yes      Yes                          +---------+---------------+---------+-----------+----------+-------+ SFJ      Full                                                 +---------+---------------+---------+-----------+----------+-------+ FV Prox  Full                                                 +---------+---------------+---------+-----------+----------+-------+ FV Mid   Full                                                 +---------+---------------+---------+-----------+----------+-------+ FV DistalFull                                                 +---------+---------------+---------+-----------+----------+-------+  PFV      Full                                                 +---------+---------------+---------+-----------+----------+-------+ POP      Full           Yes      Yes                          +---------+---------------+---------+-----------+----------+-------+ PTV      Full                                                 +---------+---------------+---------+-----------+----------+-------+ PERO     Full                                                 +---------+---------------+---------+-----------+----------+-------+    Summary: Right: There is no evidence of deep vein thrombosis in the lower extremity. No cystic structure found in the popliteal fossa. Left: There is no evidence of deep vein thrombosis in the lower extremity. No cystic structure found in the popliteal fossa.  *See  table(s) above for measurements and observations. Electronically signed by Servando Snare MD on 08/22/2018 at 3:45:10 PM.    Final    Vas [place] Upper Extremity Venous Duplex  Result Date: 08/22/2018 UPPER VENOUS STUDY  Indications: History of PE Performing Technologist: Oliver Hum RVT  Examination Guidelines: Alayne Estrella complete evaluation includes B-mode imaging, spectral Doppler, color Doppler, and power Doppler as needed of all accessible portions of each vessel. Bilateral testing is considered an integral part of Elaf Clauson complete examination. Limited examinations for reoccurring indications may be performed as noted.  Right Findings: +----------+------------+----------+---------+-----------+-------+ RIGHT     CompressiblePropertiesPhasicitySpontaneousSummary +----------+------------+----------+---------+-----------+-------+ IJV           Full                 Yes       Yes            +----------+------------+----------+---------+-----------+-------+ Subclavian    Full                 Yes       Yes            +----------+------------+----------+---------+-----------+-------+ Axillary      Full                 Yes       Yes            +----------+------------+----------+---------+-----------+-------+ Brachial      Full                 Yes       Yes            +----------+------------+----------+---------+-----------+-------+ Radial        Full                                          +----------+------------+----------+---------+-----------+-------+ Ulnar  Full                                          +----------+------------+----------+---------+-----------+-------+ Cephalic    Partial                                  Acute  +----------+------------+----------+---------+-----------+-------+ Basilic       Full                                          +----------+------------+----------+---------+-----------+-------+ Cephalic superficial thrombus surrounding  the patient's IV.  Left Findings: +----------+------------+----------+---------+-----------+-------+ LEFT      CompressiblePropertiesPhasicitySpontaneousSummary +----------+------------+----------+---------+-----------+-------+ IJV           Full                 Yes       Yes            +----------+------------+----------+---------+-----------+-------+ Subclavian    Full                 Yes       Yes            +----------+------------+----------+---------+-----------+-------+ Axillary      Full                 Yes       Yes            +----------+------------+----------+---------+-----------+-------+ Brachial      Full                 Yes       Yes            +----------+------------+----------+---------+-----------+-------+ Radial        Full                                          +----------+------------+----------+---------+-----------+-------+ Ulnar         Full                                          +----------+------------+----------+---------+-----------+-------+ Cephalic      Full                                          +----------+------------+----------+---------+-----------+-------+ Basilic       Full                                          +----------+------------+----------+---------+-----------+-------+  Summary:  Right: No evidence of deep vein thrombosis in the upper extremity. Findings consistent with acute superficial vein thrombosis involving the right cephalic vein.  Left: No evidence of deep vein thrombosis in the upper extremity. No evidence of superficial vein thrombosis in the upper extremity.  *See table(s) above for measurements and observations.  Diagnosing physician: Servando Snare MD Electronically signed by Servando Snare MD on  08/22/2018 at 3:48:27 PM.    Final         Scheduled Meds: . guaiFENesin-dextromethorphan  5 mL Oral Q8H  . insulin aspart  0-9 Units Subcutaneous TID WC  . ipratropium-albuterol  3 mL  Nebulization QID  . rosuvastatin  10 mg Oral Daily  . [START ON 08/26/2018] Tbo-filgastrim (GRANIX) SQ  300 mcg Subcutaneous Daily   Continuous Infusions:   LOS: 0 days    Time spent: over 30 min    Fayrene Helper, MD [facility] Pager see AMION  If 7PM-7AM, please contact night-coverage [email or web address] Password [facility] 08/22/2018, 8:47 PM

## 2018-08-22 NOTE — Progress Notes (Signed)
Pt was asstisted up to the toilet during which time the pain in his head returns with ncreased chest pressure & nausea, slightly diaphoretic. Returned to bed, & given the phenergan that was ordered about 2300.

## 2018-08-22 NOTE — Consult Note (Signed)
Reason for the request:    Small cell cancer.  HPI: I was asked by Dr. Florene Glen to evaluate [name] for new diagnosis of lung cancer.  He is a 73 year old man with history of diabetes, hyperlipidemia and hypertension.  He currently resides in [place] and gets his routine care in [place].  Started developing symptoms of chest pain and difficulty breathing as well as headaches and facial swelling.  He was also noted to have axillary masses as well.  He was admitted and [facility] between January 9 and January 18 for an evaluation.  During his evaluation he underwent a CT scan of the chest which showed small pulmonary embolism but diffuse lymphadenopathy including bulky mediastinal adenopathy causing severe stenosis of the upper and mid SVC.  Needle aspiration of axillary lymph node on August 16, 2018 showed small sheets of necrotic neoplasm consistent with small cell cancer likely with lung primary.  The cells are positive for CD 56, CD117 TTF and synoptic ficin.  No markers for lymphoma noted.  Based on these findings, patient was [facility] on August 21, 2017 for continuing treatments.  Clinically, he reports symptoms of chest discomfort and cough at times but no shortness of breath at rest.  He denies any neurological deficits or abdominal distention.  His appetite remains reasonable at this time.  He does not report any headaches, blurry vision, syncope or seizures. Does not report any fevers, chills or sweats.  Does not report any cough, wheezing or hemoptysis.  Does not report any chest pain, palpitation, orthopnea or leg edema.  Does not report any nausea, vomiting or abdominal pain.  Does not report any constipation or diarrhea.  Does not report any skeletal complaints.    Does not report frequency, urgency or hematuria.  Does not report any skin rashes or lesions. Does not report any heat or cold intolerance.  Does  not report any lymphadenopathy or petechiae.  Does not report any anxiety or depression.  Remaining review of systems is negative.    Past Medical History:  Diagnosis Date  . Actinic keratosis   . Arthritis   . Colitis MAY 2012    CT ABD/PELVIS HEP FLEXURE  . COPD (chronic obstructive pulmonary disease) ([place])   . Diabetes mellitus without complication ([place])   . History of kidney stones   . Hyperlipemia   . Hypertension   . NSAID long-term use NAPROXEN FOR OA  :  Past Surgical History:  Procedure Laterality Date  . BACK SURGERY     spinal stenosis  . BASAL CELL CARCINOMA EXCISION  FACE, arms feet, leg  . CHOLECYSTECTOMY  JUNE 2011 MJ   STONES, PANCREATITIS  . KNEE ARTHROSCOPY WITH MEDIAL MENISECTOMY Right 03/24/2015   Procedure: KNEE ARTHROSCOPY WITH MEDIAL MENISECTOMY;  Surgeon: John Civil, MD;  Location: [facility];  Service: Orthopedics;  Laterality: Right;  . KNEE SURGERY Left LEFT   arthroscopy  . LITHOTRIPSY  80s  . SPINE SURGERY    :   Current Facility-Administered Medications:  .  0.9 %  sodium chloride infusion, , Intravenous, Continuous, [name], John E, MD, Last Rate: 100 mL/hr at 08/22/18 0440 .  acetaminophen (TYLENOL) tablet 650 mg, 650 mg, Oral, Q6H PRN, 650 mg at 08/21/18 2241 **OR** acetaminophen (TYLENOL) suppository 650 mg, 650 mg, Rectal, Q6H PRN, [name], John E, MD .  guaiFENesin-dextromethorphan (ROBITUSSIN DM) 100-10 MG/5ML syrup 5 mL, 5 mL, Oral, Q8H, [name], John  E, MD .  insulin aspart (novoLOG) injection 0-9 Units, 0-9 Units, Subcutaneous, TID WC, [name], John E, MD .  ipratropium-albuterol (DUONEB) 0.5-2.5 (3) MG/3ML nebulizer solution 3 mL, 3 mL, Nebulization, Q4H PRN, [name], John E, MD .  ipratropium-albuterol (DUONEB) 0.5-2.5 (3) MG/3ML nebulizer solution 3 mL, 3 mL, Nebulization, QID, [name], John E, MD, 3 mL at 08/22/18 1135 .  ondansetron (ZOFRAN) tablet 4 mg, 4 mg, Oral, Q6H PRN **OR** ondansetron  (ZOFRAN) injection 4 mg, 4 mg, Intravenous, Q6H PRN, [name], John E, MD, 4 mg at 08/22/18 0815 .  polyethylene glycol (MIRALAX / GLYCOLAX) packet 17 g, 17 g, Oral, Daily PRN, [name], John E, MD .  rosuvastatin (CRESTOR) tablet 10 mg, 10 mg, Oral, Daily, [name], John E, MD:  No Known Allergies:  Family History  Problem Relation Age of Onset  . Colon polyps Neg Hx   . Colon cancer Neg Hx   :  Social History   Socioeconomic History  . Marital status: Divorced    Spouse name: Not on file  . Number of children: Not on file  . Years of education: Not on file  . Highest education level: Not on file  Occupational History  . Not on file  Social Needs  . Financial resource strain: Not on file  . Food insecurity:    Worry: Not on file    Inability: Not on file  . Transportation needs:    Medical: Not on file    Non-medical: Not on file  Tobacco Use  . Smoking status: Former Smoker    Packs/day: 1.00    Years: 50.00    Pack years: 50.00    Types: Cigarettes    Last attempt to quit: 08/07/2018    Years since quitting: 0.0  . Smokeless tobacco: Never Used  Substance and Sexual Activity  . Alcohol use: Yes    Comment: once a month  . Drug use: No  . Sexual activity: Never    Birth control/protection: None  Lifestyle  . Physical activity:    Days per week: Not on file    Minutes per session: Not on file  . Stress: Not on file  Relationships  . Social connections:    Talks on phone: Not on file    Gets together: Not on file    Attends religious service: Not on file    Active member of club or organization: Not on file    Attends meetings of clubs or organizations: Not on file    Relationship status: Not on file  . Intimate partner violence:    Fear of current or ex partner: Not on file    Emotionally abused: Not on file    Physically abused: Not on file    Forced sexual activity: Not on file  Other Topics Concern  . 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  :  Pertinent items are noted in HPI.  Exam: Blood pressure 111/75, pulse 65, temperature 98.4 F (36.9 C), temperature source Oral, resp. rate 20, height '5\' 11"'$  (1.803 m), weight 205 lb (93 kg), SpO2 95 %.  ECOG 1 General appearance: alert and cooperative appeared without distress. Head: atraumatic without any abnormalities. Eyes: conjunctivae/corneas clear. PERRL.  Sclera anicteric. Throat: lips, mucosa, and tongue normal; without oral thrush or ulcers. Resp: Expiratory wheezes noted bilaterally.  No dullness to percussion. Cardio: regular  rate and rhythm, S1, S2 normal, no murmur, click, rub or gallop GI: soft, non-tender; bowel sounds normal; no masses,  no organomegaly Skin: Skin color, texture, turgor normal. No rashes or lesions Lymph nodes: Cervical, supraclavicular, and axillary nodes normal. Neurologic: Grossly normal without any motor, sensory or deep tendon reflexes. Musculoskeletal: No joint deformity or effusion.  Recent Labs    08/21/18 1254 08/22/18 1234  WBC 9.8 10.9*  HGB 13.3 13.1  HCT 41.1 40.7  PLT 315 309   Recent Labs    08/21/18 1254  NA 138  K 4.0  CL 100  CO2 28  GLUCOSE 92  BUN 24*  CREATININE 0.93  CALCIUM 9.4     Ct Head Wo Contrast  Result Date: 08/21/2018 CLINICAL DATA:  Chest pain, sob and headache intermittently for over a week. Was seen and d/c from [facility] last week with same s/s. Has not seen pcp, has an appointment on Friday. EXAM: CT HEAD WITHOUT CONTRAST TECHNIQUE: Contiguous axial images were obtained from the base of the skull through the vertex without intravenous contrast. COMPARISON:  None. FINDINGS: Brain: No evidence of acute infarction, hemorrhage, hydrocephalus, extra-axial collection or mass lesion/mass effect. There are mild areas of white matter hypoattenuation consistent with chronic  microvascular ischemic change. Vascular: No hyperdense vessel or unexpected calcification. Skull: Normal. Negative for fracture or focal lesion. Sinuses/Orbits: Globes and orbits are unremarkable. The sinuses and mastoid air cells are clear. Other: None. IMPRESSION: 1. No acute intracranial abnormalities. 2. Mild chronic microvascular ischemic change. Electronically Signed   By: Lajean Manes M.D.   On: 08/21/2018 14:43   Ct Angio Chest Pe W And/or Wo Contrast  Result Date: 08/21/2018 CLINICAL DATA:  One-week history of intermittent chest pain, shortness of breath and headaches. EXAM: CT ANGIOGRAPHY CHEST WITH CONTRAST TECHNIQUE: Multidetector CT imaging of the chest was performed using the standard protocol during bolus administration of intravenous contrast. Multiplanar CT image reconstructions and MIPs were obtained to evaluate the vascular anatomy. CONTRAST:  150m ISOVUE-370 IOPAMIDOL INJECTION 76% IV. COMPARISON:  CT chest 01/06/2010. FINDINGS: Cardiovascular: Contrast opacification of the pulmonary arterial system and the systemic arterial system good and equivalent. No filling defects within either main pulmonary artery or their segmental branches in either lung to suggest pulmonary embolism. Heart moderately enlarged. Dense mitral valvular calcifications. Moderate to severe three-vessel coronary atherosclerosis. No pericardial effusion. Moderate atherosclerosis involving the thoracic and proximal abdominal aorta. Ascending thoracic aortic aneurysm measuring up to approximately 4.6 cm. Atherosclerosis at the origin of the great vessels without evidence of stenosis. Direct origin of the LEFT vertebral artery from the aortic arch. Severe narrowing of the proximal and mid SVC by massive lymphadenopathy which will be detailed below. Numerous collaterals are present in the RIGHT UPPER chest and back (contrast injection was performed in the RIGHT UPPER extremity). Mediastinum/Nodes: Massive mediastinal,  BILATERAL hilar and BILATERAL axillary lymphadenopathy. Enlarged lymph nodes are present at all 3 levels of both the RIGHT and LEFT axilla. BILATERAL supraclavicular lymphadenopathy is also present. Index conglomerate RIGHT UPPER paratracheal lymph nodes (station 2R) measure approximately 8.8 x 4.7 cm (series 5, image 23). Conglomerate RIGHT axillary lymph nodes measure approximately 4.9 x 4.3 cm (image 48). Conglomerate low LEFT axillary lymph nodes measure approximately 6.4 x 3.4 cm (image 53). Conglomerate subcarinal mediastinal (station 7 lymphadenopathy measures approximately 3.4 x 7.9 cm (image 54). Conglomerate RIGHT axillary lymph nodes measure approximately 5.1 x 6.1 cm (image 14).Index conglomerate LEFT axillary nodal mass measures approximately 4.1 x 7.8  cm on coronal image 71. Pericardial/duplication cyst adjacent to the RIGHT atrium in the LOWER mediastinum measures approximately 4.6 x 2.9 cm, slightly increased in size since the prior CT. Ormal appearing esophagus which I suspect is compressed by the massive mediastinal lymphadenopathy. Normal-appearing thyroid gland. Lungs/Pleura: Mild emphysematous changes throughout both lungs. Tree in bud nodular opacities in the RIGHT MIDDLE LOBE. Lungs otherwise clear. No confluent airspace consolidation. No evidence of interstitial lung disease. Subpleural lipoma involving the LEFT LATERAL chest wall. No pleural effusions. Central airways patent with moderate to severe bronchial wall thickening. The LEFT UPPER LOBE and LEFT LOWER LOBE bronchi are compressed by the bulky lymphadenopathy. The RIGHT lobar bronchi do not appear significantly compressed. Upper Abdomen: Approximate 1.5 x 1.6 cm mass in the fat adjacent to the RIGHT lobe of the liver (image 95). Normal sized celiac axis and gastrohepatic ligament lymph nodes; no pathologic lymphadenopathy in the upper abdomen. No retrocrural lymphadenopathy. At least 3 accessory splenules in the [place].  Mild pancreatic atrophy. Surgically absent gallbladder. Musculoskeletal: Mild-to-moderate diffuse thoracic spine degenerative disc disease and spondylosis. Degenerative disc disease and spondylosis involving the visualized LOWER cervical spine. No acute findings. No evidence of osseous metastatic disease. Review of the MIP images confirms the above findings. IMPRESSION: 1. Bulky mediastinal, BILATERAL hilar and BILATERAL axillary lymphadenopathy, with index conglomerate nodal masses measured above. Lymphoma is suspected, possibly Hodgkin's lymphoma. 2. No evidence of pulmonary embolism. 3. 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 4. Approximate 1.6 cm mass involving the fat adjacent to the RIGHT lobe of the liver. No evidence of pathologic lymphadenopathy in the visualized upper abdomen. 5. Tree in bud opacities in the RIGHT MIDDLE LOBE, likely an acute inflammatory process. Severe central bronchial wall thickening indicates asthma and/or bronchitis. No acute cardiopulmonary disease otherwise. 6. Benign pericardial/duplication cyst adjacent to the RIGHT atrium. 7. Ascending thoracic aortic aneurysm measuring approximately 4.6 cm diameter. Recommend semi-annual imaging followup by CTA or MRA and referral to cardiothoracic surgery if not already obtained. This recommendation follows 2010 ACCF/AHA/AATS/ACR/ASA/SCA/SCAI/SIR/STS/SVM Guidelines for the Diagnosis and Management of Patients With Thoracic Aortic Disease. Circulation. 2010; 121: I951-O841. 8. Moderate cardiomegaly. Dense aortic valvular calcifications. Moderate three-vessel coronary atherosclerosis. Aortic Atherosclerosis (ICD10-I70.0) and Emphysema (ICD10-J43.9). Aortic aneurysm NOS (ICD10-I71.9). Electronically Signed   By: Evangeline Dakin M.D.   On: 08/21/2018 15:09    Assessment and Plan:   73 year old man with the following:  1.  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  He was found to have bulky mediastinal adenopathy with bilateral hilar and axillary involvement.  There is also questionable 1.6 cm mass involving the right lobe of the liver that is suspicious for metastatic disease.  The natural course of this disease was discussed today with the patient and his family that is present at bedside.  This represents rather aggressive malignancy that requires urgent treatment.  The role of systemic chemotherapy was discussed today in detail which is critical in treating this disease long-term.  Carboplatin and etoposide and possibly the addition of immune therapy remains the cornerstone of treating this disease.  Complication associated with this chemotherapy was discussed today.  These complications include nausea, vomiting, myelosuppression, neutropenia, neutropenic sepsis and infusion related complications.  The benefit would be reduction of his tumor burden rather acutely given the rapid progression of his disease.  After discussion today, he is agreeable to proceed and he will receive the first cycle of  carboplatin and etoposide only while he is hospitalized.  Day 1 will be on January 17 of 2020 will he will receive carboplatin and etoposide.  On January 18 he will receive etoposide only in January 19 he will receive etoposide only and that will conclude cycle 1 of therapy.  Subsequent cycles of therapy will be received in the care of Dr. Delton Coombes at [facility] in 3 weeks.  The addition of immunotherapy may be contemplated for subsequent cycles.  I agree with complete the staging work-up with CT scan of the abdomen and pelvis and MRI of the brain.  We will repeat CBC and chemistries including liver function test prior to proceeding with chemotherapy.  2.  IV access: Peripheral veins will be used at this time.  Port-A-Cath could be considered for subsequent cycles.  3.  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  4.  Tumor lysis syndrome: Low risk at this time and recommend continue intravenous hydration and monitoring his electrolytes while on chemotherapy.  5.  Antiemetics: Prescription for Zofran is available to him to use as needed while hospitalized.  6.  Growth factor support: I recommend proceeding with growth factor support starting on January 20 for 7 days.  7.  Goals of care and prognosis: He has advanced malignancy that is highly responsive to chemotherapy.  His performance status is adequate and aggressive therapy is warranted.  However this disease remains incurable at this current stage.  We will continue to follow him while he is hospitalized.  80  minutes was spent with the patient face-to-face today.  More than 50% of time was dedicated to discussing imaging studies, disease status, treatment options and coordinating his ongoing care with the hospitalist team, pharmacy and nursing staff.

## 2018-08-22 NOTE — Progress Notes (Signed)
Attempted to call report to nurse for 1608 but the nurse is unavailable

## 2018-08-22 NOTE — Evaluation (Signed)
SLP Cancellation Note  Patient Details Name: HENSLEY TREAT MRN: [id number] DOB: 05-09-1946   Cancelled treatment:       Reason Eval/Treat Not Completed: Other (comment)(pt talking with oncology MD - Dr Alen Blew, will continue efforts)   Macario Golds 08/22/2018, 12:52 PM   Luanna Salk, [facility] SLP [place] Pager [phone] Office [phone]

## 2018-08-22 NOTE — Progress Notes (Signed)
Bilateral upper extremity venous duplex has been completed. Negative for DVT bilaterally. There is evidence of acute superficial vein thrombosis involving the cephalic vein of the right upper extremity.  Bilateral lower extremity venous duplex has been completed. Negative for DVT.  Results were given to the patient's nurse, Shanon Brow.  08/22/18 3:47 PM [name] RVT

## 2018-08-22 NOTE — Consult Note (Signed)
Radiation Oncology         ([phone]) [phone] ________________________________  Name: [name]        MRN: [id number]  Date of Service: 08/22/2018 DOB: 1945-08-15  [id number], Delfino Lovett, MD  No ref. provider found     REFERRING PHYSICIAN: No ref. provider found   DIAGNOSIS: The encounter diagnosis was Nonspecific chest pain.   HISTORY OF PRESENT ILLNESS: [name] is a 73 y.o. male seen at the request of Dr. Denton Brick with [facility] for a new diagnosed lung cancer. The patient was seen by his PCP in December for increasing growths in bilateral adnexa.  A chest x-ray at that time was negative for any acute findings, and revealed some bibasilar atelectasis.  This persisted, and he was seen for increasing shortness of breath, and CT imaging revealed a tumor in the right lung, and he also underwent a biopsy of his right axilla at [facility] in [place].  This was performed on Friday, 08/16/2018.  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  No immunophenotypic evidence of monoclonal B-cell or aberrant T-cell population was noted, and this is felt to be a primary small cell lung cancer.  He was taken in transfer when he presented to [facility] down [facility] [place] [facility].  While he was here a CT pulmonary angiogram revealed massive mediastinal, bilateral hilar and bilateral annual axillary adenopathy with enlarged lymph nodes at all 3 levels of the right and left axilla, bilateral supraclavicular adenopathy was also noted.  In the right upper paratracheal nodes the conglomerate measured 8.8 x 4.7 cm, and in the right axilla the conglomerate measured 4.9 x 4.3 cm.  In the left axilla conglomerate measured 6.4 x 3.4 cm, and subcarinal conglomerate measured 3.4 x 7.9 cm, additional right axillary nodes measured 5.1 x 6.1 cm, and 4.1 x 7.8 cm on the left.  In the right side of the chest along the lower mediastinum near the right  atrium was a 4.6 x 2.9 cm mass, and his esophagus appeared to be compressed by the mediastinal adenopathy.  Mild emphysematous changes were noted throughout both lungs and tree-in-bud nodular opacities in the right middle lobe were identified.  Subpleural lipoma involving the left lateral chest wall was identified, no pleural effusions were present.  There was a 1.5 x 1.6 cm mass in the fat adjacent to the right lobe of the liver and at least 3 accessory splenule's in the left upper quadrant.  Degenerative disease and spondylosis was seen throughout the thoracic spine, and no evidence of osseous metastatic disease was present.  Given the findings we are seen today to consider palliative radiation.     PREVIOUS RADIATION THERAPY: No   PAST MEDICAL HISTORY:  Past Medical History:  Diagnosis Date  . Actinic keratosis   . Arthritis   . Colitis MAY 2012    CT ABD/PELVIS HEP FLEXURE  . COPD (chronic obstructive pulmonary disease) ([place])   . Diabetes mellitus without complication ([place])   . History of kidney stones   . Hyperlipemia   . Hypertension   . NSAID long-term use NAPROXEN FOR OA       PAST SURGICAL HISTORY: Past Surgical History:  Procedure Laterality Date  . BACK SURGERY     spinal stenosis  . BASAL CELL CARCINOMA EXCISION  FACE, arms feet, leg  . CHOLECYSTECTOMY  JUNE 2011 MJ   STONES, PANCREATITIS  . KNEE ARTHROSCOPY WITH MEDIAL MENISECTOMY  Right 03/24/2015   Procedure: KNEE ARTHROSCOPY WITH MEDIAL MENISECTOMY;  Surgeon: Carole Civil, MD;  Location: [facility];  Service: Orthopedics;  Laterality: Right;  . KNEE SURGERY Left LEFT   arthroscopy  . LITHOTRIPSY  80s  . SPINE SURGERY       FAMILY HISTORY:  Family History  Problem Relation Age of Onset  . Colon polyps Neg Hx   . Colon cancer Neg Hx      SOCIAL HISTORY:  reports that he quit smoking about 2 weeks ago. His smoking use included cigarettes. He has a 50.00 pack-year smoking history. He has never used  smokeless tobacco. He reports current alcohol use. He reports that he does not use drugs. 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 He lives in [place] with his brother. He is accompanied by his sister.   ALLERGIES: Patient has no known allergies.   MEDICATIONS:  Current Facility-Administered Medications  Medication Dose Route Frequency Provider Last Rate Last Dose  . 0.9 %  sodium chloride infusion   Intravenous Continuous Emokpae, Ejiroghene E, MD 100 mL/hr at 08/22/18 0440    . acetaminophen (TYLENOL) tablet 650 mg  650 mg Oral Q6H PRN Emokpae, Ejiroghene E, MD   650 mg at 08/21/18 2241   Or  . acetaminophen (TYLENOL) suppository 650 mg  650 mg Rectal Q6H PRN Emokpae, Ejiroghene E, MD      . guaiFENesin-dextromethorphan (ROBITUSSIN DM) 100-10 MG/5ML syrup 5 mL  5 mL Oral Q8H Emokpae, Ejiroghene E, MD      . insulin aspart (novoLOG) injection 0-9 Units  0-9 Units Subcutaneous TID WC Emokpae, Ejiroghene E, MD      . ipratropium-albuterol (DUONEB) 0.5-2.5 (3) MG/3ML nebulizer solution 3 mL  3 mL Nebulization Q4H PRN Emokpae, Ejiroghene E, MD      . ipratropium-albuterol (DUONEB) 0.5-2.5 (3) MG/3ML nebulizer solution 3 mL  3 mL Nebulization QID Emokpae, Ejiroghene E, MD      . ondansetron (ZOFRAN) tablet 4 mg  4 mg Oral Q6H PRN Emokpae, Ejiroghene E, MD       Or  . ondansetron (ZOFRAN) injection 4 mg  4 mg Intravenous Q6H PRN Emokpae, Ejiroghene E, MD   4 mg at 08/22/18 0815  . polyethylene glycol (MIRALAX / GLYCOLAX) packet 17 g  17 g Oral Daily PRN Emokpae, Ejiroghene E, MD      . rosuvastatin (CRESTOR) tablet 10 mg  10 mg Oral Daily Emokpae, Ejiroghene E, MD         REVIEW OF SYSTEMS: On review of systems, the patient reports that he is doing a little bit better with O2. He reports shortness of breath any exertion. He denies any sternal chest pain and denies hemoptysis. He has had increasing discomfort with swallowing and  feels fullness in his chest. He has moderate cough, but denies fevers, chills, night sweats. He has been trying to quit smoking and has been tobacco free for the last two weeks. He has lost 30 pounds intentionally over the last year. He denies any bowel or bladder disturbances, and denies abdominal pain, nausea or vomiting. He denies any new musculoskeletal or joint aches or pains. A complete review of systems is obtained and is otherwise negative.     PHYSICAL EXAM:  Wt Readings from Last 3 Encounters:  08/21/18 205 lb (93 kg)  06/07/15 215 lb (97.5 kg)  06/02/15 215 lb (97.5 kg)   Temp Readings from  Last 3 Encounters:  08/22/18 98.4 F (36.9 C) (Oral)  03/24/15 (!) 96.4 F (35.8 C) (Axillary)  03/22/15 97.7 F (36.5 C) (Oral)   BP Readings from Last 3 Encounters:  08/22/18 111/75  06/07/15 (!) 144/76  05/10/15 137/78   Pulse Readings from Last 3 Encounters:  08/22/18 65  03/24/15 (!) 57  03/22/15 78   Pain Assessment Pain Score: 0-No pain/10  In general this is a chronically ill appearing caucasian male in no acute distress. He is alert and oriented x4 and appropriate throughout the examination. HEENT reveals that the patient is normocephalic, atraumatic, but that there is mild right sided edema at the base of the chin. EOMs are intact. Skin is intact with multiple keratotic changes and edema with callus of the right hand greater than left.  Cardiovascular exam reveals a bradycardic rate and normal rhythm, no clicks rubs or murmurs are auscultated. Chest is clear to auscultation on the left with decreased breath sounds over the upper right lung field. Lymphatic assessment is performed and does not reveal any adenopathy in the cervical region though this is somewhat limited by the edema. He has some fullness in the supraclavicular region right greater than left without distinct adenopathy. He has telangiectatic changes of the right should with early features of collateral vessels  along the anterior chest. He has a large palpable right axillary node that is about 5 cm, and about 3 cm in the left axilla. No other palpable masses are noted. Abdomen has active bowel sounds in all quadrants and is intact. The abdomen is soft, non tender, non distended. Lower extremities are negative for pretibial pitting edema, deep calf tenderness, cyanosis or clubbing.   ECOG = 4  0 - Asymptomatic (Fully active, able to carry on all predisease activities without restriction)  1 - Symptomatic but completely ambulatory (Restricted in physically strenuous activity but ambulatory and able to carry out work of a light or sedentary nature. For example, light housework, office work)  2 - Symptomatic, <50% in bed during the day (Ambulatory and capable of all self care but unable to carry out any work activities. Up and about more than 50% of waking hours)  3 - Symptomatic, >50% in bed, but not bedbound (Capable of only limited self-care, confined to bed or chair 50% or more of waking hours)  4 - Bedbound (Completely disabled. Cannot carry on any self-care. Totally confined to bed or chair)  5 - Death   Eustace Pen MM, Creech RH, Tormey DC, et al. [phone]). "Toxicity and response criteria of the [facility]". [place] Oncol. 5 (6): 649-55    LABORATORY DATA:  Lab Results  Component Value Date   WBC 9.8 08/21/2018   HGB 13.3 08/21/2018   HCT 41.1 08/21/2018   MCV 94.3 08/21/2018   PLT 315 08/21/2018   Lab Results  Component Value Date   NA 138 08/21/2018   K 4.0 08/21/2018   CL 100 08/21/2018   CO2 28 08/21/2018   Lab Results  Component Value Date   ALT 18 12/22/2010   AST 19 12/22/2010   ALKPHOS 95 12/22/2010   BILITOT 0.9 12/22/2010      RADIOGRAPHY: Dg Chest 2 View  Result Date: 08/21/2018 CLINICAL DATA:  Chest pain, sob, nausea, and headache intermittently for over a week. Was seen and d/c from [facility] last week with same s/s. Hx COPD, HTN,  DM, pulm embolism. Smoker. EXAM: CHEST - 2 VIEW COMPARISON:  07/25/2018 and  older exams. FINDINGS: Cardiac silhouette is top-normal in size. There is a smooth concave soft tissue bulge from the right superior mediastinum, most likely vascular, but more prominent than on prior exams. There is no deviation or mass effect on the trachea. Mediastinum otherwise normal in contour. Main pulmonary arteries appear enlarged. There are thickened bronchovascular markings bilaterally stable from the prior exam. No evidence of pneumonia or pulmonary edema. No pleural effusion or pneumothorax. Skeletal structures are intact. IMPRESSION: 1. No acute cardiopulmonary disease. 2. Chronically thickened bronchovascular markings and enlarged pulmonary arteries, the latter suggesting pulmonary hypertension. 3. Soft tissue contour bulge along the right superior mediastinum that is most likely vascular. This could be further assessed with chest CT. Electronically Signed   By: Lajean Manes M.D.   On: 08/21/2018 12:56   Dg Chest 2 View  Result Date: 07/25/2018 CLINICAL DATA:  Lumps under his arms EXAM: CHEST - 2 VIEW COMPARISON:  None. FINDINGS: Upper normal heart size. Low lung volumes. Bibasilar subsegmental atelectasis. Upper lungs clear. No pneumothorax or pleural effusion. No pneumothorax or pleural effusion. IMPRESSION: Bibasilar atelectasis. Electronically Signed   By: Marybelle Killings M.D.   On: 07/25/2018 14:49   Ct Head Wo Contrast  Result Date: 08/21/2018 CLINICAL DATA:  Chest pain, sob and headache intermittently for over a week. Was seen and d/c from [facility] last week with same s/s. Has not seen pcp, has an appointment on Friday. EXAM: CT HEAD WITHOUT CONTRAST TECHNIQUE: Contiguous axial images were obtained from the base of the skull through the vertex without intravenous contrast. COMPARISON:  None. FINDINGS: Brain: No evidence of acute infarction, hemorrhage, hydrocephalus, extra-axial collection or mass  lesion/mass effect. There are mild areas of white matter hypoattenuation consistent with chronic microvascular ischemic change. Vascular: No hyperdense vessel or unexpected calcification. Skull: Normal. Negative for fracture or focal lesion. Sinuses/Orbits: Globes and orbits are unremarkable. The sinuses and mastoid air cells are clear. Other: None. IMPRESSION: 1. No acute intracranial abnormalities. 2. Mild chronic microvascular ischemic change. Electronically Signed   By: Lajean Manes M.D.   On: 08/21/2018 14:43   Ct Angio Chest Pe W And/or Wo Contrast  Result Date: 08/21/2018 CLINICAL DATA:  One-week history of intermittent chest pain, shortness of breath and headaches. EXAM: CT ANGIOGRAPHY CHEST WITH CONTRAST TECHNIQUE: Multidetector CT imaging of the chest was performed using the standard protocol during bolus administration of intravenous contrast. Multiplanar CT image reconstructions and MIPs were obtained to evaluate the vascular anatomy. CONTRAST:  183mL ISOVUE-370 IOPAMIDOL INJECTION 76% IV. COMPARISON:  CT chest 01/06/2010. FINDINGS: Cardiovascular: Contrast opacification of the pulmonary arterial system and the systemic arterial system good and equivalent. No filling defects within either main pulmonary artery or their segmental branches in either lung to suggest pulmonary embolism. Heart moderately enlarged. Dense mitral valvular calcifications. Moderate to severe three-vessel coronary atherosclerosis. No pericardial effusion. Moderate atherosclerosis involving the thoracic and proximal abdominal aorta. Ascending thoracic aortic aneurysm measuring up to approximately 4.6 cm. Atherosclerosis at the origin of the great vessels without evidence of stenosis. Direct origin of the LEFT vertebral artery from the aortic arch. Severe narrowing of the proximal and mid SVC by massive lymphadenopathy which will be detailed below. Numerous collaterals are present in the RIGHT UPPER chest and back (contrast  injection was performed in the RIGHT UPPER extremity). Mediastinum/Nodes: Massive mediastinal, BILATERAL hilar and BILATERAL axillary lymphadenopathy. Enlarged lymph nodes are present at all 3 levels of both the RIGHT and LEFT axilla. BILATERAL supraclavicular lymphadenopathy is also present.  Index conglomerate RIGHT UPPER paratracheal lymph nodes (station 2R) measure approximately 8.8 x 4.7 cm (series 5, image 23). Conglomerate RIGHT axillary lymph nodes measure approximately 4.9 x 4.3 cm (image 48). Conglomerate low LEFT axillary lymph nodes measure approximately 6.4 x 3.4 cm (image 53). Conglomerate subcarinal mediastinal (station 7 lymphadenopathy measures approximately 3.4 x 7.9 cm (image 54). Conglomerate RIGHT axillary lymph nodes measure approximately 5.1 x 6.1 cm (image 14).Index conglomerate LEFT axillary nodal mass measures approximately 4.1 x 7.8 cm on coronal image 71. Pericardial/duplication cyst adjacent to the RIGHT atrium in the LOWER mediastinum measures approximately 4.6 x 2.9 cm, slightly increased in size since the prior CT. Ormal appearing esophagus which I suspect is compressed by the massive mediastinal lymphadenopathy. Normal-appearing thyroid gland. Lungs/Pleura: Mild emphysematous changes throughout both lungs. Tree in bud nodular opacities in the RIGHT MIDDLE LOBE. Lungs otherwise clear. No confluent airspace consolidation. No evidence of interstitial lung disease. Subpleural lipoma involving the LEFT LATERAL chest wall. No pleural effusions. Central airways patent with moderate to severe bronchial wall thickening. The LEFT UPPER LOBE and LEFT LOWER LOBE bronchi are compressed by the bulky lymphadenopathy. The RIGHT lobar bronchi do not appear significantly compressed. Upper Abdomen: Approximate 1.5 x 1.6 cm mass in the fat adjacent to the RIGHT lobe of the liver (image 95). Normal sized celiac axis and gastrohepatic ligament lymph nodes; no pathologic lymphadenopathy in the upper  abdomen. No retrocrural lymphadenopathy. At least 3 accessory splenules in the [place]. Mild pancreatic atrophy. Surgically absent gallbladder. Musculoskeletal: Mild-to-moderate diffuse thoracic spine degenerative disc disease and spondylosis. Degenerative disc disease and spondylosis involving the visualized LOWER cervical spine. No acute findings. No evidence of osseous metastatic disease. Review of the MIP images confirms the above findings. IMPRESSION: 1. Bulky mediastinal, BILATERAL hilar and BILATERAL axillary lymphadenopathy, with index conglomerate nodal masses measured above. Lymphoma is suspected, possibly Hodgkin's lymphoma. 2. No evidence of pulmonary embolism. 3. 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 4. Approximate 1.6 cm mass involving the fat adjacent to the RIGHT lobe of the liver. No evidence of pathologic lymphadenopathy in the visualized upper abdomen. 5. Tree in bud opacities in the RIGHT MIDDLE LOBE, likely an acute inflammatory process. Severe central bronchial wall thickening indicates asthma and/or bronchitis. No acute cardiopulmonary disease otherwise. 6. Benign pericardial/duplication cyst adjacent to the RIGHT atrium. 7. Ascending thoracic aortic aneurysm measuring approximately 4.6 cm diameter. Recommend semi-annual imaging followup by CTA or MRA and referral to cardiothoracic surgery if not already obtained. This recommendation follows 2010 ACCF/AHA/AATS/ACR/ASA/SCA/SCAI/SIR/STS/SVM Guidelines for the Diagnosis and Management of Patients With Thoracic Aortic Disease. Circulation. 2010; 121: H734-K876. 8. Moderate cardiomegaly. Dense aortic valvular calcifications. Moderate three-vessel coronary atherosclerosis. Aortic Atherosclerosis (ICD10-I70.0) and Emphysema (ICD10-J43.9). Aortic aneurysm NOS (ICD10-I71.9). Electronically Signed   By: Evangeline Dakin M.D.   On: 08/21/2018 15:09        IMPRESSION/PLAN: 1. 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 Dr. Lisbeth Renshaw has reviewed his case and I discussed the pathology findings and imaging results to date. I reviewed the nature of small cell carcinoma of the lung.  We discussed the risks, benefits, short, and long term effects of radiotherapy, and the patient is interested in proceeding. I discussed the delivery and logistics of radiotherapy as well. Dr. Lisbeth Renshaw recommends a course of 2 weeks of radiotherapy. We will simulate today and begin treatment urgently this afternoon. Given the possibility of rebound  respiratory distress in these patients, it would be prudent to keep him hospitalized to follow his response after about 3 fractions. We'd be anticipating a need for him to stay until early next week to ensure he was doing well. I've reached out to Dr. Delton Coombes as well at [facility] cancer center as the patient desires systemic therapy closer to home. We will order any additional work up imaging including brain MRI per Dr. Delton Coombes. He will need outpatient PET imaging as well.  2. Code Status. 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 He elects for DNR status and orders will be placed for this.  3. PCI. While he is nowhere ready for this, I did explain the risk of small cell carcinoma's affinity to spread to the brain. While he does have extensive stage disease, we would still offer prophylactic cranial irradiation if he has stability of his disease at a later date. He is in agreement to continue this discussion at the appropriate time. He is also aware that whole brain radiotherapy would be recommended if he did have disease on his screening workup MRI.   In a visit lasting 70 minutes, greater than 50% of the time was spent face to face discussing his case face to face and with his sister in the room and in floor time,  coordinating the patient's care.     Carola Rhine, PAC

## 2018-08-23 ENCOUNTER — Encounter (HOSPITAL_COMMUNITY): Payer: Self-pay | Admitting: Radiology

## 2018-08-23 ENCOUNTER — Ambulatory Visit
Admit: 2018-08-23 | Discharge: 2018-08-23 | Disposition: A | Discharge status: Home / Self Care | Payer: Medicare Other | Attending: Radiation Oncology | Admitting: Radiation Oncology

## 2018-08-23 ENCOUNTER — Inpatient Hospital Stay (HOSPITAL_COMMUNITY): Payer: Medicare Other

## 2018-08-23 DIAGNOSIS — I10 Essential (primary) hypertension: Secondary | ICD-10-CM | POA: Diagnosis present

## 2018-08-23 DIAGNOSIS — C787 Secondary malignant neoplasm of liver and intrahepatic bile duct: Secondary | ICD-10-CM | POA: Diagnosis present

## 2018-08-23 DIAGNOSIS — E883 Tumor lysis syndrome: Secondary | ICD-10-CM

## 2018-08-23 DIAGNOSIS — Z9049 Acquired absence of other specified parts of digestive tract: Secondary | ICD-10-CM | POA: Diagnosis not present

## 2018-08-23 DIAGNOSIS — J449 Chronic obstructive pulmonary disease, unspecified: Secondary | ICD-10-CM | POA: Diagnosis present

## 2018-08-23 DIAGNOSIS — Z87442 Personal history of urinary calculi: Secondary | ICD-10-CM | POA: Diagnosis not present

## 2018-08-23 DIAGNOSIS — Z79899 Other long term (current) drug therapy: Secondary | ICD-10-CM | POA: Diagnosis not present

## 2018-08-23 DIAGNOSIS — Z7982 Long term (current) use of aspirin: Secondary | ICD-10-CM | POA: Diagnosis not present

## 2018-08-23 DIAGNOSIS — Z86711 Personal history of pulmonary embolism: Secondary | ICD-10-CM | POA: Diagnosis not present

## 2018-08-23 DIAGNOSIS — C3491 Malignant neoplasm of unspecified part of right bronchus or lung: Secondary | ICD-10-CM | POA: Diagnosis present

## 2018-08-23 DIAGNOSIS — I712 Thoracic aortic aneurysm, without rupture: Secondary | ICD-10-CM | POA: Diagnosis present

## 2018-08-23 DIAGNOSIS — Z85828 Personal history of other malignant neoplasm of skin: Secondary | ICD-10-CM | POA: Diagnosis not present

## 2018-08-23 DIAGNOSIS — Z87891 Personal history of nicotine dependence: Secondary | ICD-10-CM | POA: Diagnosis not present

## 2018-08-23 DIAGNOSIS — C3490 Malignant neoplasm of unspecified part of unspecified bronchus or lung: Secondary | ICD-10-CM | POA: Diagnosis not present

## 2018-08-23 DIAGNOSIS — I871 Compression of vein: Secondary | ICD-10-CM | POA: Diagnosis present

## 2018-08-23 DIAGNOSIS — C7931 Secondary malignant neoplasm of brain: Secondary | ICD-10-CM | POA: Diagnosis present

## 2018-08-23 DIAGNOSIS — E119 Type 2 diabetes mellitus without complications: Secondary | ICD-10-CM | POA: Diagnosis present

## 2018-08-23 DIAGNOSIS — I82611 Acute embolism and thrombosis of superficial veins of right upper extremity: Secondary | ICD-10-CM | POA: Diagnosis present

## 2018-08-23 DIAGNOSIS — I2699 Other pulmonary embolism without acute cor pulmonale: Secondary | ICD-10-CM | POA: Diagnosis present

## 2018-08-23 DIAGNOSIS — R079 Chest pain, unspecified: Secondary | ICD-10-CM | POA: Diagnosis present

## 2018-08-23 DIAGNOSIS — R590 Localized enlarged lymph nodes: Secondary | ICD-10-CM | POA: Diagnosis present

## 2018-08-23 DIAGNOSIS — M1990 Unspecified osteoarthritis, unspecified site: Secondary | ICD-10-CM | POA: Diagnosis present

## 2018-08-23 DIAGNOSIS — Z5111 Encounter for antineoplastic chemotherapy: Secondary | ICD-10-CM | POA: Diagnosis not present

## 2018-08-23 DIAGNOSIS — Z7984 Long term (current) use of oral hypoglycemic drugs: Secondary | ICD-10-CM | POA: Diagnosis not present

## 2018-08-23 DIAGNOSIS — E785 Hyperlipidemia, unspecified: Secondary | ICD-10-CM | POA: Diagnosis present

## 2018-08-23 DIAGNOSIS — Z7901 Long term (current) use of anticoagulants: Secondary | ICD-10-CM | POA: Diagnosis not present

## 2018-08-23 DIAGNOSIS — Z66 Do not resuscitate: Secondary | ICD-10-CM | POA: Diagnosis not present

## 2018-08-23 LAB — CBC WITH DIFFERENTIAL/PLATELET
Abs Immature Granulocytes: 0.09 10*3/uL — ABNORMAL HIGH (ref 0.00–0.07)
Basophils Absolute: 0 10*3/uL (ref 0.0–0.1)
Basophils Relative: 0 %
Eosinophils Absolute: 0.1 10*3/uL (ref 0.0–0.5)
Eosinophils Relative: 1 %
HCT: 40.6 % (ref 39.0–52.0)
HEMOGLOBIN: 13 g/dL (ref 13.0–17.0)
IMMATURE GRANULOCYTES: 1 %
LYMPHS PCT: 15 %
Lymphs Abs: 1.5 10*3/uL (ref 0.7–4.0)
MCH: 30.9 pg (ref 26.0–34.0)
MCHC: 32 g/dL (ref 30.0–36.0)
MCV: 96.4 fL (ref 80.0–100.0)
Monocytes Absolute: 0.9 10*3/uL (ref 0.1–1.0)
Monocytes Relative: 9 %
NEUTROS ABS: 7.3 10*3/uL (ref 1.7–7.7)
Neutrophils Relative %: 74 %
Platelets: 278 10*3/uL (ref 150–400)
RBC: 4.21 MIL/uL — ABNORMAL LOW (ref 4.22–5.81)
RDW: 13.6 % (ref 11.5–15.5)
WBC: 9.9 10*3/uL (ref 4.0–10.5)
nRBC: 0 % (ref 0.0–0.2)

## 2018-08-23 LAB — GLUCOSE, CAPILLARY
GLUCOSE-CAPILLARY: 88 mg/dL (ref 70–99)
Glucose-Capillary: 102 mg/dL — ABNORMAL HIGH (ref 70–99)
Glucose-Capillary: 118 mg/dL — ABNORMAL HIGH (ref 70–99)
Glucose-Capillary: 224 mg/dL — ABNORMAL HIGH (ref 70–99)

## 2018-08-23 LAB — COMPREHENSIVE METABOLIC PANEL
ALT: 52 U/L — ABNORMAL HIGH (ref 0–44)
AST: 40 U/L (ref 15–41)
Albumin: 3.3 g/dL — ABNORMAL LOW (ref 3.5–5.0)
Alkaline Phosphatase: 64 U/L (ref 38–126)
Anion gap: 9 (ref 5–15)
BUN: 19 mg/dL (ref 8–23)
CO2: 27 mmol/L (ref 22–32)
Calcium: 8.5 mg/dL — ABNORMAL LOW (ref 8.9–10.3)
Chloride: 104 mmol/L (ref 98–111)
Creatinine, Ser: 0.81 mg/dL (ref 0.61–1.24)
GFR calc Af Amer: >60 mL/min (ref >60)
GFR calc non Af Amer: >60 mL/min (ref >60)
GLUCOSE: 178 mg/dL — AB (ref 70–99)
Potassium: 3.6 mmol/L (ref 3.5–5.1)
Sodium: 140 mmol/L (ref 135–145)
Total Bilirubin: 0.8 mg/dL (ref 0.3–1.2)
Total Protein: 6.1 g/dL — ABNORMAL LOW (ref 6.5–8.1)

## 2018-08-23 LAB — MAGNESIUM: Magnesium: 2.4 mg/dL (ref 1.7–2.4)

## 2018-08-23 MED ORDER — SODIUM CHLORIDE 0.9 % IV SOLN: 100.0000 mg/m2 | Freq: Once | INTRAVENOUS | Status: DC

## 2018-08-23 MED ORDER — IOHEXOL 300 MG/ML  SOLN
100.0000 mL | Freq: Once | INTRAMUSCULAR | Status: AC | PRN
  Administered 2018-08-23: 100 mL via INTRAVENOUS

## 2018-08-23 MED ORDER — SODIUM CHLORIDE 0.9 % IV SOLN
10.0000 mg | Freq: Once | INTRAVENOUS | Status: AC
  Administered 2018-08-24: 10 mg via INTRAVENOUS
  Filled 2018-08-23: qty 1

## 2018-08-23 MED ORDER — SODIUM CHLORIDE 0.9 % IV SOLN
Freq: Once | INTRAVENOUS | Status: AC
  Administered 2018-08-24: 20:00:00 via INTRAVENOUS

## 2018-08-23 MED ORDER — TBO-FILGRASTIM 300 MCG/0.5ML ~~LOC~~ SOSY
300.0000 ug | PREFILLED_SYRINGE | Freq: Every day | SUBCUTANEOUS | Status: DC
  Filled 2018-08-23: qty 0.5

## 2018-08-23 MED ORDER — SODIUM CHLORIDE 0.9 % IV SOLN
10.0000 mg | Freq: Once | INTRAVENOUS | Status: AC
  Administered 2018-08-25: 10 mg via INTRAVENOUS
  Filled 2018-08-23: qty 1

## 2018-08-23 MED ORDER — SODIUM CHLORIDE 0.9 % IV SOLN
100.0000 mg/m2 | Freq: Once | INTRAVENOUS | Status: AC
  Administered 2018-08-24: 220 mg via INTRAVENOUS
  Filled 2018-08-23: qty 11

## 2018-08-23 MED ORDER — SODIUM CHLORIDE 0.9 % IV SOLN
INTRAVENOUS | Status: AC
  Administered 2018-08-24 (×2): via INTRAVENOUS

## 2018-08-23 MED ORDER — PALONOSETRON HCL INJECTION 0.25 MG/5ML
0.2500 mg | Freq: Once | INTRAVENOUS | Status: AC
  Administered 2018-08-23: 0.25 mg via INTRAVENOUS
  Filled 2018-08-23: qty 5

## 2018-08-23 MED ORDER — HOT PACK MISC ONCOLOGY
1.0000 | Freq: Once | Status: AC | PRN
  Filled 2018-08-23: qty 1

## 2018-08-23 MED ORDER — IPRATROPIUM-ALBUTEROL 0.5-2.5 (3) MG/3ML IN SOLN
3.0000 mL | Freq: Two times a day (BID) | RESPIRATORY_TRACT | Status: DC
  Administered 2018-08-23 – 2018-08-26 (×5): 3 mL via RESPIRATORY_TRACT
  Filled 2018-08-23 (×5): qty 3

## 2018-08-23 MED ORDER — SODIUM CHLORIDE 0.9 % IV SOLN
Freq: Once | INTRAVENOUS | Status: AC
  Administered 2018-08-23: 13:00:00 via INTRAVENOUS
  Filled 2018-08-23: qty 5

## 2018-08-23 MED ORDER — BUTALBITAL-APAP-CAFFEINE 50-325-40 MG PO TABS
1.0000 | ORAL_TABLET | Freq: Once | ORAL | Status: DC | PRN
  Filled 2018-08-23: qty 1

## 2018-08-23 MED ORDER — SODIUM CHLORIDE 0.9 % IV SOLN
100.0000 mg/m2 | Freq: Once | INTRAVENOUS | Status: AC
  Administered 2018-08-23: 220 mg via INTRAVENOUS
  Filled 2018-08-23: qty 11

## 2018-08-23 MED ORDER — SODIUM CHLORIDE 0.9 % IV SOLN
100.0000 mg/m2 | Freq: Once | INTRAVENOUS | Status: AC
  Administered 2018-08-25: 220 mg via INTRAVENOUS
  Filled 2018-08-23: qty 11

## 2018-08-23 MED ORDER — IOHEXOL 300 MG/ML  SOLN: 15.0000 mL | Freq: Once | INTRAMUSCULAR | Status: DC | PRN

## 2018-08-23 MED ORDER — SODIUM CHLORIDE 0.9 % IV SOLN
564.0000 mg | Freq: Once | INTRAVENOUS | Status: AC
  Administered 2018-08-23: 560 mg via INTRAVENOUS
  Filled 2018-08-23: qty 45

## 2018-08-23 NOTE — Progress Notes (Signed)
Per PT statement (as well as male caregiver)- PT takes Albuterol MDI as needed at home.

## 2018-08-23 NOTE — Progress Notes (Signed)
IP PROGRESS NOTE  Subjective:   Events noted in the last 24 hours.  [name] reports some nausea but no vomiting.  He is reporting mild headache and has not been able to eat yet.  He had multiple studies done today including CT scan as well as received radiation therapy as well.  He reports that his nausea has started before starting chemotherapy today.  He denies any blurry vision or changes in his mentation.  He denies any chest pain, difficulty breathing or palpitation.  He denies abdominal pain, diarrhea.  He denies any bleeding or ecchymosis.  Objective:  Vital signs in last 24 hours: Temp:  [97.6 F (36.4 C)-99 F (37.2 C)] 97.6 F (36.4 C) (01/17 0554) Pulse Rate:  [70-79] 79 (01/17 0554) Resp:  [20-25] 20 (01/17 0554) BP: (98-122)/(56-75) 114/71 (01/17 0554) SpO2:  [92 %-96 %] 96 % (01/17 0855) Weight:  [206 lb 12.7 oz (93.8 kg)] 206 lb 12.7 oz (93.8 kg) (01/16 2126) Weight change: 1 lb 12.7 oz (0.813 kg) Last BM Date: 08/21/18  Intake/Output from previous day: 01/16 0701 - 01/17 0700 In: 959.4 [P.O.:120; I.V.:839.4] Out: -  General: Chronically ill-appearing gentleman in no Head: Normocephalic atraumatic. Mouth: mucous membranes moist, pharynx normal without lesions Eyes: No scleral icterus.  Pupils are equal and round reactive to light. Resp: clear to auscultation bilaterally without rhonchi or wheezes or dullness to percussion. Cardio: regular rate and rhythm, S1, S2 normal, no murmur, click, rub or gallop GI: soft, non-tender; bowel sounds normal; no masses,  no organomegaly Musculoskeletal: No joint deformity or effusion. Neurological: No motor, sensory deficits.  Intact deep tendon reflexes. Skin: No rashes or lesions.   Lab Results: Recent Labs    08/22/18 1234 08/23/18 0325  WBC 10.9* 9.9  HGB 13.1 13.0  HCT 40.7 40.6  PLT 309 278    BMET Recent Labs    08/22/18 1234 08/23/18 0325  NA 139 140  K 3.7 3.6  CL 107 104  CO2 25 27  GLUCOSE 124*  178*  BUN 19 19  CREATININE 0.77 0.81  CALCIUM 8.7* 8.5*    Mr Brain W Wo Contrast  Result Date: 08/22/2018 CLINICAL DATA:  73 y/o  M; small cell lung cancer staging. EXAM: MRI HEAD WITHOUT AND WITH CONTRAST TECHNIQUE: Multiplanar, multiecho pulse sequences of the brain and surrounding structures were obtained without and with intravenous contrast. CONTRAST:  9 cc Gadavist COMPARISON:  08/21/2018 CT head. FINDINGS: Brain: 3 subcentimeter lesions within the brain demonstrating diffusion hyperintensity in the right external capsule, right occipital lobe, and right cerebellar hemisphere (series 3 image 14, 20, 25). The lesions within the right cerebellar hemisphere and the right external capsule demonstrate enhancement best appreciated on the sagittal sequence (series 13, image 7 and 9). The right occipital lesion does not demonstrate appreciable enhancement, however, is clearly visible on the T1 noncontrast sequences (series 9 image 20). Mild edema is associated with the right cerebellar lesion. No additional lesion in the brain is identified. Background of punctate nonspecific T2 FLAIR hyperintensities in subcortical and periventricular white matter are compatible with mild chronic microvascular ischemic changes for age. Mild volume loss of the brain. No stroke, hemorrhage, extra-axial collection, hydrocephalus, or significant mass effect. Vascular: Normal flow voids. Skull and upper cervical spine: Normal marrow signal. Sinuses/Orbits: Negative. Other: None. IMPRESSION: 1. 3 small metastasis are present in the right insula, right occipital lobe, and right cerebellar hemisphere. 2. No hemorrhage, mass effect, or stroke identified. 3. Mild for age chronic  microvascular ischemic changes and volume loss of the brain. Electronically Signed   By: Kristine Garbe M.D.   On: 08/22/2018 14:42   Ct Abdomen Pelvis W Contrast  Result Date: 08/23/2018 CLINICAL DATA:  History of small cell lung cancer. EXAM:  CT ABDOMEN AND PELVIS WITH CONTRAST TECHNIQUE: Multidetector CT imaging of the abdomen and pelvis was performed using the standard protocol following bolus administration of intravenous contrast. CONTRAST:  17mL OMNIPAQUE IOHEXOL 300 MG/ML  SOLN COMPARISON:  CT scan of Dec 22, 2010.  CT scan of August 21, 2018. FINDINGS: Lower chest: Minimal bibasilar subsegmental atelectasis is noted. 3.9 cm pericardial cyst is noted. Hepatobiliary: No focal liver abnormality is seen. Status post cholecystectomy. No biliary dilatation. Pancreas: Unremarkable. No pancreatic ductal dilatation or surrounding inflammatory changes. Spleen: Normal in size without focal abnormality. Adrenals/Urinary Tract: 14 mm left adrenal nodule is noted which is slightly enlarged compared to prior exam, most consistent with benign adenoma. Right adrenal gland appears normal. No hydronephrosis or renal obstruction is noted small exophytic cyst is seen arising posteriorly from left kidney. No hydronephrosis or renal obstruction is noted. Mild urinary bladder distention is noted. Stomach/Bowel: Stomach is within normal limits. Appendix appears normal. No evidence of bowel wall thickening, distention, or inflammatory changes. Vascular/Lymphatic: Aortic atherosclerosis. No enlarged abdominal or pelvic lymph nodes. Reproductive: Mild prostatic enlargement is noted. Other: No hernia or ascites is noted. 2.1 cm mildly lobulated soft tissue density is seen superior to the right kidney which was not present on prior exam. It demonstrates average Hounsfield measurement of 22. Musculoskeletal: No acute or significant osseous findings. IMPRESSION: 2.1 cm abnormal soft tissue density is seen posterior to the liver as described on prior CT scan of August 21, 2018, but not visualized on prior exam of Dec 22, 2010. Possible malignancy or metastatic disease can not be excluded. 14 mm left adrenal nodule is noted which was present on prior exam of 2012 and most  consistent with benign adenoma. Mild prostatic enlargement. Aortic Atherosclerosis (ICD10-I70.0). Electronically Signed   By: Marijo Conception, M.D.   On: 08/23/2018 11:51      Medications: I have reviewed the patient's current medications.  Assessment/Plan:  73 year old man with the following:  1.    Small cell lung cancer diagnosed in January 2020 presenting with extensive stage.  He has bilateral axillary adenopathy and possibly hepatic metastasis.  He is receiving day 1 of cycle 1 of chemotherapy utilizing carboplatin and etoposide.  He will receive etoposide on January 18 and 19 complete cycle 1 of therapy.  Cycle 2 of therapy will be in 3 weeks and will be given as an outpatient at [facility].  He will likely need to 6 cycles of therapy with potentially adding Tecentriq for subsequent cycles.   2.  IV access: He risks and benefits of a Port-A-Cath insertion was reviewed today with patient and his family.  This option will be deferred for subsequent cycles.  3.  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  4.  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  He is aggressively being hydrated.  5.    Nausea: Zofran is available to him at this time.  6.  Growth factor support: Granix will start on January 20 for 7 days.  7.  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  Brain radiation will be required at some point.  8.  Goals of care and prognosis: Therapy is palliative  at this time although aggressive therapy is warranted given his reasonable performance status and treatable malignancy..    25  minutes was spent with the patient face-to-face today.  More than 50% of time was dedicated to reviewing imaging studies, laboratory data and answering questions regarding plan of care.     LOS: 0 days   Zola Button 08/23/2018, 2:14 PM

## 2018-08-23 NOTE — Progress Notes (Signed)
[facility] Therapy Treatment Record Phone [phone]   Radiation Therapy was administered to [name] on: 08/23/2018  10:58 AM and was treatment # 2 out of a planned course of 10 treatments.  Radiation Treatment  1). Beam photons with 6-10 energy  2). Brachytherapy None  3). Stereotactic Radiosurgery None  4). Other Radiation None     Jacques Earthly, RT (T)

## 2018-08-23 NOTE — Progress Notes (Signed)
Chemotherapy teaching provided to patient. Handouts for Carbo and Etoposide provided and side effects discussed. "Chemotherapy and You" book given to patient.  No immediate questions. Patient and sisters encouraged to ask questions as they arise.  Consent signed and placed in chart. Will to continue to monitor

## 2018-08-23 NOTE — Progress Notes (Signed)
Chemo dosages and dilutions verified by Aldean Baker, RN and Laural Benes, RN

## 2018-08-23 NOTE — Evaluation (Signed)
SLP Cancellation Note  Patient Details Name: [name] MRN: [id number] DOB: 03-Aug-1946   Cancelled treatment:        Pt at radiation at this time. Will continue efforts.  Luanna Salk, MS [facility] SLP Acute Rehab Services Pager [phone] Office [phone]                                                                                             Macario Golds 08/23/2018, 4:03 PM   Luanna Salk, [facility] SLP Acute Rehab Services Pager [phone] Office [phone]

## 2018-08-23 NOTE — Progress Notes (Signed)
PROGRESS NOTE    [name]  RWE:[id number] DOB: Aug 17, 1945 DOA: 08/21/2018 PCP: Lucia Gaskins, MD   Brief Narrative:  [name] is a 73 y.o. male with medical history significant for HTN, DM, COPD, who presented to the ED with complaints of intermittent episodes of difficulty breathing and chest pain involving his upper chest from above his axilla towards his neck.  Over the past 3 weeks he has had 2 syncopal episodes and 2 near syncopal episodes last episode was this morning.  Difficulty breathing and chest pain is associated with headache and diaphoresis.  No aggravating or relieving factors.  He also feels like his throat is closing up and his chest is congested.  Patient also reports a mass in his right axilla, noticed about 2 to 3 months ago, sister reports within the past week the size of the mass has doubled.  He denies obvious swelling of his face or neck. He is not on home O2.  He also reports productive cough.  No fevers or chills. Patient was recently admitted at [facility], 1/9- 08/24/18 for similar symptoms, after another episode of nearly passing out.  Patient was diagnosed with a small pulmonary embolism and started on Eliquis he also had an FNA done-sample was taken from his right axilla.  Results of which are still pending.  He also had work-up for syncope-ultrasound of his heart and neck. He was supposed to follow-up with an oncologist at [facility] oncology center 08/26/17.   ED Course: Blood pressure systolic 61-950.  On room air.  Chest x-ray-chronically thickened bronchovascular markings and enlarged pulmonary arteries suggesting pulmonary hypertension, soft tissue control both right superior mediastinum, chest CT recommended.  This point CTA shows bulky mediastinal bilateral hilar and bilateral axillary lymphadenopathy, possibly Hodgkin's lymphoma.  No PE.  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  Initial EKG showed T wave inversion in V2, not evident on subsequent EKGs.  Troponin normal.  Patient was given aspirin 500 mill bolus.  Hospitalist to admit for chest pain.  Assessment & Plan:   Active Problems:   SVC syndrome   Thoracic ascending aortic aneurysm (HCC)   Lymphadenopathy   Bulky lymphadenopathy with SVC syndrome  Metastatic Small Cell Lung Cancer:   - CT scan with bulky mediastinal, bilateral hilar, bilateral axillary lymphadenopathy with severe stenosis involving upper and mid SVC due to compression by mediastinal nodal mass.  Also with 1.6 cm mass adjacent to R lobe of liver (see report) - Radiation oncology has been consulted, appreciate recs - planning to proceed with radiation therapy - plan for 2 weeks of radiotherapy - Oncology consulted, appreciate recs, planning for chemotherapy in house (carboplatin/etoposide).  Growth factor support ordered to start on Jan 20 by oncology. - MRI brain with 3 small metastasis in R insula, R occipital lobe, and R cerebellar hemisphere - CT abdomen with 2.1 cm abnormal soft tissue density as well as adrenal nodule - Plan is for him to f/u at [facility] after hospitalization - SLP  Pulmonary embolism  -recent diagnosis at [facility].  CTA chest here without PE (discussed with rads who agreed, no PE on our imaging).  Will stop anticoagulaton, follow UE and LE ultrasounds (no DVT in upper or lower extremity, did have SVT in R upper extremity in cephalic vein).  COPD -between episodes of chest pain and difficulty breathing patient's has no difficulty breathing.  But has a productive cough.  No wheezing on exam.  Chest CT with tree in bud opacities in R middle lobe and severe central bronchial wall thickening suggesting asthma and/or bronchitis.  Patient was treated with steroids and  antibiotics azithromycin at [facility].  Shortness of breath with mild 2 L O2 requirement at that time was attributed to bronchitis and small PE.  Pt appears stable at this time without wheezing on exam. -Duo nebs scheduled and PRN -Mucolytics  Ascending thoracic aortic aneurysm- incidental finding on chest CTA measuring approximately 4.6 cm.  Semi-and well follow-up with CTA or MRA and referral to cardiothoracic surgery recommended. - F/u as outpt  HTN-systolic blood pressure 70-786.  -Hold home verapamil, telmisartan HCTZ  DM-glucose 92. -Hold home metformin - SSI  DVT prophylaxis: SCD Code Status: DNR Family Communication: children, grandchildren at bedside Disposition Plan: Pt with metastatic small cell and SVC syndrome requiring chemo/radiation therapy.  On discussion with radiation oncology, given concern for possibility of rebound respiratory distress in these patients, recommending to keep pt hospitalized to follow response after 3 fractions.  Anticipating stay until early next week.  Pt will require inpatient treatment given need for close monitoring with initiation of radiation therapy.  Consultants:   Rad onc  oncology  Procedures:  LE [place] Summary: Right: There is no evidence of deep vein thrombosis in the lower extremity. No cystic structure found in the popliteal fossa. Left: There is no evidence of deep vein thrombosis in the lower extremity. No cystic structure found in the popliteal fossa.  UE [place] Right: No evidence of deep vein thrombosis in the upper extremity. Findings consistent with acute superficial vein thrombosis involving the right cephalic vein.   Left: No evidence of deep vein thrombosis in the upper extremity. No evidence of superficial vein thrombosis in the upper extremity.     Antimicrobials:  Anti-infectives (From admission, onward)   None         Subjective: Having HA and nausea while i'm at bedside This has been coming and  going  Objective: Vitals:   08/22/18 2126 08/23/18 0554 08/23/18 0855 08/23/18 1428  BP: 122/75 114/71  103/69  Pulse: 75 79  76  Resp: 20 20  16   Temp: 97.7 F (36.5 C) 97.6 F (36.4 C)  97.6 F (36.4 C)  TempSrc: Oral Oral  Oral  SpO2: 94% 95% 96% 99%  Weight: 93.8 kg     Height: 5\' 11"  (1.803 m)       Intake/Output Summary (Last 24 hours) at 08/23/2018 1919 Last data filed at 08/23/2018 1700 Gross per 24 hour  Intake 426 ml  Output -  Net 426 ml   Filed Weights   08/21/18 1230 08/22/18 2126  Weight: 93 kg 93.8 kg    Examination:  General exam: Appears uncomfortable with HA  Respiratory system: Clear to auscultation. Respiratory effort normal. Cardiovascular system: S1 & S2 heard, RRR. Gastrointestinal system: Abdomen is nondistended, soft and nontender. Central nervous system: Alert and oriented. No focal neurological deficits. Extremities: no upper extremity or LE edema Skin: No rashes, lesions or ulcers Psychiatry: Judgement and insight appear normal. Mood & affect appropriate.     Data Reviewed: I have personally reviewed following labs and imaging studies  CBC: Recent Labs  Lab 08/21/18 1254 08/22/18 1234 08/23/18 0325  WBC 9.8 10.9* 9.9  NEUTROABS  --   --  7.3  HGB 13.3  13.1 13.0  HCT 41.1 40.7 40.6  MCV 94.3 95.1 96.4  PLT 315 309 182   Basic Metabolic Panel: Recent Labs  Lab 08/21/18 1254 08/22/18 1234 08/23/18 0325  NA 138 139 140  K 4.0 3.7 3.6  CL 100 107 104  CO2 28 25 27   GLUCOSE 92 124* 178*  BUN 24* 19 19  CREATININE 0.93 0.77 0.81  CALCIUM 9.4 8.7* 8.5*  MG  --   --  2.4   GFR: Estimated Creatinine Clearance: 96.4 mL/min (by C-G formula based on SCr of 0.81 mg/dL). Liver Function Tests: Recent Labs  Lab 08/22/18 1234 08/23/18 0325  AST 39 40  ALT 52* 52*  ALKPHOS 67 64  BILITOT 1.0 0.8  PROT 6.3* 6.1*  ALBUMIN 3.6 3.3*   No results for input(s): LIPASE, AMYLASE in the last 168 hours. No results for input(s):  AMMONIA in the last 168 hours. Coagulation Profile: No results for input(s): INR, PROTIME in the last 168 hours. Cardiac Enzymes: Recent Labs  Lab 08/21/18 1254  TROPONINI <0.03   BNP (last 3 results) No results for input(s): PROBNP in the last 8760 hours. HbA1C: No results for input(s): HGBA1C in the last 72 hours. CBG: Recent Labs  Lab 08/22/18 1825 08/22/18 2019 08/23/18 0731 08/23/18 1134 08/23/18 1648  GLUCAP 102* 140* 102* 88 118*   Lipid Profile: No results for input(s): CHOL, HDL, LDLCALC, TRIG, CHOLHDL, LDLDIRECT in the last 72 hours. Thyroid Function Tests: No results for input(s): TSH, T4TOTAL, FREET4, T3FREE, THYROIDAB in the last 72 hours. Anemia Panel: No results for input(s): VITAMINB12, FOLATE, FERRITIN, TIBC, IRON, RETICCTPCT in the last 72 hours. Sepsis Labs: No results for input(s): PROCALCITON, LATICACIDVEN in the last 168 hours.  No results found for this or any previous visit (from the past 240 hour(s)).       Radiology Studies: Mr Jeri Cos XH Contrast  Result Date: 08/22/2018 CLINICAL DATA:  73 y/o  M; small cell lung cancer staging. EXAM: MRI HEAD WITHOUT AND WITH CONTRAST TECHNIQUE: Multiplanar, multiecho pulse sequences of the brain and surrounding structures were obtained without and with intravenous contrast. CONTRAST:  9 cc Gadavist COMPARISON:  08/21/2018 CT head. FINDINGS: Brain: 3 subcentimeter lesions within the brain demonstrating diffusion hyperintensity in the right external capsule, right occipital lobe, and right cerebellar hemisphere (series 3 image 14, 20, 25). The lesions within the right cerebellar hemisphere and the right external capsule demonstrate enhancement best appreciated on the sagittal sequence (series 13, image 7 and 9). The right occipital lesion does not demonstrate appreciable enhancement, however, is clearly visible on the T1 noncontrast sequences (series 9 image 20). Mild edema is associated with the right cerebellar  lesion. No additional lesion in the brain is identified. Background of punctate nonspecific T2 FLAIR hyperintensities in subcortical and periventricular white matter are compatible with mild chronic microvascular ischemic changes for age. Mild volume loss of the brain. No stroke, hemorrhage, extra-axial collection, hydrocephalus, or significant mass effect. Vascular: Normal flow voids. Skull and upper cervical spine: Normal marrow signal. Sinuses/Orbits: Negative. Other: None. IMPRESSION: 1. 3 small metastasis are present in the right insula, right occipital lobe, and right cerebellar hemisphere. 2. No hemorrhage, mass effect, or stroke identified. 3. Mild for age chronic microvascular ischemic changes and volume loss of the brain. Electronically Signed   By: Kristine Garbe M.D.   On: 08/22/2018 14:42   Ct Abdomen Pelvis W Contrast  Result Date: 08/23/2018 CLINICAL DATA:  History of small cell lung cancer. EXAM:  CT ABDOMEN AND PELVIS WITH CONTRAST TECHNIQUE: Multidetector CT imaging of the abdomen and pelvis was performed using the standard protocol following bolus administration of intravenous contrast. CONTRAST:  144mL OMNIPAQUE IOHEXOL 300 MG/ML  SOLN COMPARISON:  CT scan of Dec 22, 2010.  CT scan of August 21, 2018. FINDINGS: Lower chest: Minimal bibasilar subsegmental atelectasis is noted. 3.9 cm pericardial cyst is noted. Hepatobiliary: No focal liver abnormality is seen. Status post cholecystectomy. No biliary dilatation. Pancreas: Unremarkable. No pancreatic ductal dilatation or surrounding inflammatory changes. Spleen: Normal in size without focal abnormality. Adrenals/Urinary Tract: 14 mm left adrenal nodule is noted which is slightly enlarged compared to prior exam, most consistent with benign adenoma. Right adrenal gland appears normal. No hydronephrosis or renal obstruction is noted small exophytic cyst is seen arising posteriorly from left kidney. No hydronephrosis or renal obstruction  is noted. Mild urinary bladder distention is noted. Stomach/Bowel: Stomach is within normal limits. Appendix appears normal. No evidence of bowel wall thickening, distention, or inflammatory changes. Vascular/Lymphatic: Aortic atherosclerosis. No enlarged abdominal or pelvic lymph nodes. Reproductive: Mild prostatic enlargement is noted. Other: No hernia or ascites is noted. 2.1 cm mildly lobulated soft tissue density is seen superior to the right kidney which was not present on prior exam. It demonstrates average Hounsfield measurement of 22. Musculoskeletal: No acute or significant osseous findings. IMPRESSION: 2.1 cm abnormal soft tissue density is seen posterior to the liver as described on prior CT scan of August 21, 2018, but not visualized on prior exam of Dec 22, 2010. Possible malignancy or metastatic disease can not be excluded. 14 mm left adrenal nodule is noted which was present on prior exam of 2012 and most consistent with benign adenoma. Mild prostatic enlargement. Aortic Atherosclerosis (ICD10-I70.0). Electronically Signed   By: Marijo Conception, M.D.   On: 08/23/2018 11:51   Vas [place] Lower Extremity Venous (dvt)  Result Date: 08/22/2018  Lower Venous Study Indications: History of PE.  Performing Technologist: Oliver Hum RVT  Examination Guidelines: A complete evaluation includes B-mode imaging, spectral Doppler, color Doppler, and power Doppler as needed of all accessible portions of each vessel. Bilateral testing is considered an integral part of a complete examination. Limited examinations for reoccurring indications may be performed as noted.  Right Venous Findings: +---------+---------------+---------+-----------+----------+-------+          CompressibilityPhasicitySpontaneityPropertiesSummary +---------+---------------+---------+-----------+----------+-------+ CFV      Full           Yes      Yes                           +---------+---------------+---------+-----------+----------+-------+ SFJ      Full                                                 +---------+---------------+---------+-----------+----------+-------+ FV Prox  Full                                                 +---------+---------------+---------+-----------+----------+-------+ FV Mid   Full                                                 +---------+---------------+---------+-----------+----------+-------+  FV DistalFull                                                 +---------+---------------+---------+-----------+----------+-------+ PFV      Full                                                 +---------+---------------+---------+-----------+----------+-------+ POP      Full           Yes      Yes                          +---------+---------------+---------+-----------+----------+-------+ PTV      Full                                                 +---------+---------------+---------+-----------+----------+-------+ PERO     Full                                                 +---------+---------------+---------+-----------+----------+-------+  Left Venous Findings: +---------+---------------+---------+-----------+----------+-------+          CompressibilityPhasicitySpontaneityPropertiesSummary +---------+---------------+---------+-----------+----------+-------+ CFV      Full           Yes      Yes                          +---------+---------------+---------+-----------+----------+-------+ SFJ      Full                                                 +---------+---------------+---------+-----------+----------+-------+ FV Prox  Full                                                 +---------+---------------+---------+-----------+----------+-------+ FV Mid   Full                                                  +---------+---------------+---------+-----------+----------+-------+ FV DistalFull                                                 +---------+---------------+---------+-----------+----------+-------+ PFV      Full                                                 +---------+---------------+---------+-----------+----------+-------+  POP      Full           Yes      Yes                          +---------+---------------+---------+-----------+----------+-------+ PTV      Full                                                 +---------+---------------+---------+-----------+----------+-------+ PERO     Full                                                 +---------+---------------+---------+-----------+----------+-------+    Summary: Right: There is no evidence of deep vein thrombosis in the lower extremity. No cystic structure found in the popliteal fossa. Left: There is no evidence of deep vein thrombosis in the lower extremity. No cystic structure found in the popliteal fossa.  *See table(s) above for measurements and observations. Electronically signed by Servando Snare MD on 08/22/2018 at 3:45:10 PM.    Final    Vas [place] Upper Extremity Venous Duplex  Result Date: 08/22/2018 UPPER VENOUS STUDY  Indications: History of PE Performing Technologist: Oliver Hum RVT  Examination Guidelines: A complete evaluation includes B-mode imaging, spectral Doppler, color Doppler, and power Doppler as needed of all accessible portions of each vessel. Bilateral testing is considered an integral part of a complete examination. Limited examinations for reoccurring indications may be performed as noted.  Right Findings: +----------+------------+----------+---------+-----------+-------+ RIGHT     CompressiblePropertiesPhasicitySpontaneousSummary +----------+------------+----------+---------+-----------+-------+ IJV           Full                 Yes       Yes             +----------+------------+----------+---------+-----------+-------+ Subclavian    Full                 Yes       Yes            +----------+------------+----------+---------+-----------+-------+ Axillary      Full                 Yes       Yes            +----------+------------+----------+---------+-----------+-------+ Brachial      Full                 Yes       Yes            +----------+------------+----------+---------+-----------+-------+ Radial        Full                                          +----------+------------+----------+---------+-----------+-------+ Ulnar         Full                                          +----------+------------+----------+---------+-----------+-------+ Cephalic    Partial  Acute  +----------+------------+----------+---------+-----------+-------+ Basilic       Full                                          +----------+------------+----------+---------+-----------+-------+ Cephalic superficial thrombus surrounding the patient's IV.  Left Findings: +----------+------------+----------+---------+-----------+-------+ LEFT      CompressiblePropertiesPhasicitySpontaneousSummary +----------+------------+----------+---------+-----------+-------+ IJV           Full                 Yes       Yes            +----------+------------+----------+---------+-----------+-------+ Subclavian    Full                 Yes       Yes            +----------+------------+----------+---------+-----------+-------+ Axillary      Full                 Yes       Yes            +----------+------------+----------+---------+-----------+-------+ Brachial      Full                 Yes       Yes            +----------+------------+----------+---------+-----------+-------+ Radial        Full                                           +----------+------------+----------+---------+-----------+-------+ Ulnar         Full                                          +----------+------------+----------+---------+-----------+-------+ Cephalic      Full                                          +----------+------------+----------+---------+-----------+-------+ Basilic       Full                                          +----------+------------+----------+---------+-----------+-------+  Summary:  Right: No evidence of deep vein thrombosis in the upper extremity. Findings consistent with acute superficial vein thrombosis involving the right cephalic vein.  Left: No evidence of deep vein thrombosis in the upper extremity. No evidence of superficial vein thrombosis in the upper extremity.  *See table(s) above for measurements and observations.  Diagnosing physician: Servando Snare MD Electronically signed by Servando Snare MD on 08/22/2018 at 3:48:27 PM.    Final         Scheduled Meds: . [START ON 08/25/2018] etoposide  100 mg/m2 (Treatment Plan Recorded) Intravenous Once  . [START ON 08/24/2018] etoposide  100 mg/m2 Intravenous Once  . insulin aspart  0-9 Units Subcutaneous TID WC  . ipratropium-albuterol  3 mL Nebulization BID  . rosuvastatin  10 mg Oral Daily  . [START ON 08/26/2018] Tbo-filgastrim (GRANIX) SQ  300 mcg Subcutaneous Daily  Continuous Infusions: . sodium chloride    . [START ON 08/24/2018] dexamethasone (DECADRON) IVPB [facility]    . [START ON 08/25/2018] dexamethasone (DECADRON) IVPB [facility]       LOS: 0 days    Time spent: over 30 min    Fayrene Helper, MD [facility] Pager see AMION  If 7PM-7AM, please contact night-coverage [email or web address] Password [facility] 08/23/2018, 7:19 PM

## 2018-08-24 DIAGNOSIS — Z5111 Encounter for antineoplastic chemotherapy: Secondary | ICD-10-CM

## 2018-08-24 LAB — COMPREHENSIVE METABOLIC PANEL
ALT: 47 U/L — ABNORMAL HIGH (ref 0–44)
AST: 32 U/L (ref 15–41)
Albumin: 3.1 g/dL — ABNORMAL LOW (ref 3.5–5.0)
Alkaline Phosphatase: 66 U/L (ref 38–126)
Anion gap: 11 (ref 5–15)
BUN: 16 mg/dL (ref 8–23)
CO2: 21 mmol/L — ABNORMAL LOW (ref 22–32)
Calcium: 8.2 mg/dL — ABNORMAL LOW (ref 8.9–10.3)
Chloride: 104 mmol/L (ref 98–111)
Creatinine, Ser: 0.67 mg/dL (ref 0.61–1.24)
GFR calc Af Amer: >60 mL/min (ref >60)
GFR calc non Af Amer: >60 mL/min (ref >60)
Glucose, Bld: 159 mg/dL — ABNORMAL HIGH (ref 70–99)
POTASSIUM: 4.1 mmol/L (ref 3.5–5.1)
Sodium: 136 mmol/L (ref 135–145)
Total Bilirubin: 0.7 mg/dL (ref 0.3–1.2)
Total Protein: 5.9 g/dL — ABNORMAL LOW (ref 6.5–8.1)

## 2018-08-24 LAB — GLUCOSE, CAPILLARY
Glucose-Capillary: 110 mg/dL — ABNORMAL HIGH (ref 70–99)
Glucose-Capillary: 156 mg/dL — ABNORMAL HIGH (ref 70–99)
Glucose-Capillary: 175 mg/dL — ABNORMAL HIGH (ref 70–99)
Glucose-Capillary: 169 mg/dL — ABNORMAL HIGH (ref 70–99)

## 2018-08-24 LAB — CBC
HCT: 40 % (ref 39.0–52.0)
Hemoglobin: 12.8 g/dL — ABNORMAL LOW (ref 13.0–17.0)
MCH: 31.1 pg (ref 26.0–34.0)
MCHC: 32 g/dL (ref 30.0–36.0)
MCV: 97.3 fL (ref 80.0–100.0)
Platelets: 262 10*3/uL (ref 150–400)
RBC: 4.11 MIL/uL — ABNORMAL LOW (ref 4.22–5.81)
RDW: 13.2 % (ref 11.5–15.5)
WBC: 8.7 10*3/uL (ref 4.0–10.5)
nRBC: 0 % (ref 0.0–0.2)

## 2018-08-24 LAB — MAGNESIUM: Magnesium: 2.3 mg/dL (ref 1.7–2.4)

## 2018-08-24 MED ORDER — CALCIUM CARBONATE ANTACID 500 MG PO CHEW
1.0000 | CHEWABLE_TABLET | Freq: Once | ORAL | Status: AC
  Administered 2018-08-24: 200 mg via ORAL
  Filled 2018-08-24: qty 1

## 2018-08-24 NOTE — Progress Notes (Signed)
PROGRESS NOTE    [name]  LZJ:[id number] DOB: 1945-12-24 DOA: 08/21/2018 PCP: Lucia Gaskins, MD   Brief Narrative:  [name] is a 73 y.o. male with medical history significant for HTN, DM, COPD, who presented to the ED with complaints of intermittent episodes of difficulty breathing and chest pain involving his upper chest from above his axilla towards his neck.  Over the past 3 weeks he has had 2 syncopal episodes and 2 near syncopal episodes last episode was this morning.  Difficulty breathing and chest pain is associated with headache and diaphoresis.  No aggravating or relieving factors.  He also feels like his throat is closing up and his chest is congested.  Patient also reports a mass in his right axilla, noticed about 2 to 3 months ago, sister reports within the past week the size of the mass has doubled.  He denies obvious swelling of his face or neck. He is not on home O2.  He also reports productive cough.  No fevers or chills. Patient was recently admitted at [facility], 1/9- 08/24/18 for similar symptoms, after another episode of nearly passing out.  Patient was diagnosed with a small pulmonary embolism and started on Eliquis he also had an FNA done-sample was taken from his right axilla.  Results of which are still pending.  He also had work-up for syncope-ultrasound of his heart and neck. He was supposed to follow-up with an oncologist at [facility] oncology center 08/26/17.   ED Course: Blood pressure systolic 02-409.  On room air.  Chest x-ray-chronically thickened bronchovascular markings and enlarged pulmonary arteries suggesting pulmonary hypertension, soft tissue control both right superior mediastinum, chest CT recommended.  This point CTA shows bulky mediastinal bilateral hilar and bilateral axillary lymphadenopathy, possibly Hodgkin's lymphoma.  No PE.  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  Initial EKG showed T wave inversion in V2, not evident on subsequent EKGs.  Troponin normal.  Patient was given aspirin 500 mill bolus.  Hospitalist to admit for chest pain.  Assessment & Plan:   Active Problems:   SVC syndrome   Thoracic ascending aortic aneurysm (HCC)   Lymphadenopathy   Bulky lymphadenopathy with SVC syndrome  Metastatic Small Cell Lung Cancer:   - CT scan with bulky mediastinal, bilateral hilar, bilateral axillary lymphadenopathy with severe stenosis involving upper and mid SVC due to compression by mediastinal nodal mass.  Also with 1.6 cm mass adjacent to R lobe of liver (see report) - Radiation oncology has been consulted, appreciate recs - planning to proceed with radiation therapy - plan for 2 weeks of radiotherapy - Oncology consulted, appreciate recs, planning for chemotherapy in house (carboplatin/etoposide).  Day#2 chemo today.  Growth factor support ordered to start on Jan 20 by oncology. - MRI brain with 3 small metastasis in R insula, R occipital lobe, and R cerebellar hemisphere - CT abdomen with 2.1 cm abnormal soft tissue density as well as adrenal nodule - Plan is for him to f/u at [facility] after hospitalization - SLP  Pulmonary embolism  -recent diagnosis at [facility].  CTA chest here without PE (discussed with rads who agreed, no PE on our imaging).  Will stop anticoagulaton, follow UE and LE ultrasounds (no DVT in upper or lower extremity, did have SVT in R upper extremity in  cephalic vein).    COPD -between episodes of chest pain and difficulty breathing patient's has no difficulty breathing.  But has a productive cough.  No wheezing on exam.  Chest CT with tree in bud opacities in R middle lobe and severe central bronchial wall thickening suggesting asthma and/or bronchitis.  Patient was  treated with steroids and antibiotics azithromycin at [facility].  Shortness of breath with mild 2 L O2 requirement at that time was attributed to bronchitis and small PE.  Pt appears stable at this time without wheezing on exam. -Duo nebs scheduled and PRN -Mucolytics  Ascending thoracic aortic aneurysm- incidental finding on chest CTA measuring approximately 4.6 cm.  Semi-and well follow-up with CTA or MRA and referral to cardiothoracic surgery recommended. - F/u as outpt  HTN-systolic blood pressure 74-259.  -Hold home verapamil, telmisartan HCTZ  DM-glucose 92. -Hold home metformin - SSI  DVT prophylaxis: SCD Code Status: DNR Family Communication: children, grandchildren at bedside Disposition Plan: Pt with metastatic small cell and SVC syndrome requiring chemo/radiation therapy.  On discussion with radiation oncology, given concern for possibility of rebound respiratory distress in these patients, recommending to keep pt hospitalized to follow response after 3 fractions.  Anticipating stay until early next week.  Pt will require inpatient treatment given need for close monitoring with initiation of radiation therapy.  Consultants:   Rad onc  oncology  Procedures:  LE [place] Summary: Right: There is no evidence of deep vein thrombosis in the lower extremity. No cystic structure found in the popliteal fossa. Left: There is no evidence of deep vein thrombosis in the lower extremity. No cystic structure found in the popliteal fossa.  UE [place] Right: No evidence of deep vein thrombosis in the upper extremity. Findings consistent with acute superficial vein thrombosis involving the right cephalic vein.   Left: No evidence of deep vein thrombosis in the upper extremity. No evidence of superficial vein thrombosis in the upper extremity.     Antimicrobials:  Anti-infectives (From admission, onward)   None         Subjective: He hasn't had a spell today Feeling  well  Objective: Vitals:   08/23/18 1959 08/23/18 2108 08/24/18 0444 08/24/18 1321  BP:  121/71 100/60 116/75  Pulse:  91 73 70  Resp:  18 16 18   Temp:  97.8 F (36.6 C) 97.9 F (36.6 C) 97.8 F (36.6 C)  TempSrc:  Oral Oral Oral  SpO2: 92% 92% 94% 90%  Weight:      Height:        Intake/Output Summary (Last 24 hours) at 08/24/2018 1915 Last data filed at 08/24/2018 1722 Gross per 24 hour  Intake 3515.63 ml  Output 300 ml  Net 3215.63 ml   Filed Weights   08/21/18 1230 08/22/18 2126  Weight: 93 kg 93.8 kg    Examination:  General: No acute distress. Cardiovascular: Heart sounds show a regular rate, and rhythm. Lungs: Clear to auscultation bilaterally Abdomen: Soft, nontender, nondistended  Neurological: Alert and oriented 3. Moves all extremities 4. Cranial nerves II through XII grossly intact. Skin: Warm and dry. No rashes or lesions. Extremities: No clubbing or cyanosis. No edema.  Psychiatric: Mood and affect are normal. Insight and judgment are appropriate.    Data Reviewed: I have personally reviewed following labs and imaging studies  CBC: Recent Labs  Lab 08/21/18 1254 08/22/18 1234 08/23/18 0325 08/24/18 0306  WBC 9.8 10.9* 9.9 8.7  NEUTROABS  --   --  7.3  --  HGB 13.3 13.1 13.0 12.8*  HCT 41.1 40.7 40.6 40.0  MCV 94.3 95.1 96.4 97.3  PLT 315 309 278 056   Basic Metabolic Panel: Recent Labs  Lab 08/21/18 1254 08/22/18 1234 08/23/18 0325 08/24/18 0306  NA 138 139 140 136  K 4.0 3.7 3.6 4.1  CL 100 107 104 104  CO2 28 25 27  21*  GLUCOSE 92 124* 178* 159*  BUN 24* 19 19 16   CREATININE 0.93 0.77 0.81 0.67  CALCIUM 9.4 8.7* 8.5* 8.2*  MG  --   --  2.4 2.3   GFR: Estimated Creatinine Clearance: 97.6 mL/min (by C-G formula based on SCr of 0.67 mg/dL). Liver Function Tests: Recent Labs  Lab 08/22/18 1234 08/23/18 0325 08/24/18 0306  AST 39 40 32  ALT 52* 52* 47*  ALKPHOS 67 64 66  BILITOT 1.0 0.8 0.7  PROT 6.3* 6.1* 5.9*   ALBUMIN 3.6 3.3* 3.1*   No results for input(s): LIPASE, AMYLASE in the last 168 hours. No results for input(s): AMMONIA in the last 168 hours. Coagulation Profile: No results for input(s): INR, PROTIME in the last 168 hours. Cardiac Enzymes: Recent Labs  Lab 08/21/18 1254  TROPONINI <0.03   BNP (last 3 results) No results for input(s): PROBNP in the last 8760 hours. HbA1C: No results for input(s): HGBA1C in the last 72 hours. CBG: Recent Labs  Lab 08/23/18 1648 08/23/18 2114 08/24/18 0743 08/24/18 1202 08/24/18 1714  GLUCAP 118* 224* 110* 156* 175*   Lipid Profile: No results for input(s): CHOL, HDL, LDLCALC, TRIG, CHOLHDL, LDLDIRECT in the last 72 hours. Thyroid Function Tests: No results for input(s): TSH, T4TOTAL, FREET4, T3FREE, THYROIDAB in the last 72 hours. Anemia Panel: No results for input(s): VITAMINB12, FOLATE, FERRITIN, TIBC, IRON, RETICCTPCT in the last 72 hours. Sepsis Labs: No results for input(s): PROCALCITON, LATICACIDVEN in the last 168 hours.  No results found for this or any previous visit (from the past 240 hour(s)).       Radiology Studies: Ct Abdomen Pelvis W Contrast  Result Date: 08/23/2018 CLINICAL DATA:  History of small cell lung cancer. EXAM: CT ABDOMEN AND PELVIS WITH CONTRAST TECHNIQUE: Multidetector CT imaging of the abdomen and pelvis was performed using the standard protocol following bolus administration of intravenous contrast. CONTRAST:  15mL OMNIPAQUE IOHEXOL 300 MG/ML  SOLN COMPARISON:  CT scan of Dec 22, 2010.  CT scan of August 21, 2018. FINDINGS: Lower chest: Minimal bibasilar subsegmental atelectasis is noted. 3.9 cm pericardial cyst is noted. Hepatobiliary: No focal liver abnormality is seen. Status post cholecystectomy. No biliary dilatation. Pancreas: Unremarkable. No pancreatic ductal dilatation or surrounding inflammatory changes. Spleen: Normal in size without focal abnormality. Adrenals/Urinary Tract: 14 mm left  adrenal nodule is noted which is slightly enlarged compared to prior exam, most consistent with benign adenoma. Right adrenal gland appears normal. No hydronephrosis or renal obstruction is noted small exophytic cyst is seen arising posteriorly from left kidney. No hydronephrosis or renal obstruction is noted. Mild urinary bladder distention is noted. Stomach/Bowel: Stomach is within normal limits. Appendix appears normal. No evidence of bowel wall thickening, distention, or inflammatory changes. Vascular/Lymphatic: Aortic atherosclerosis. No enlarged abdominal or pelvic lymph nodes. Reproductive: Mild prostatic enlargement is noted. Other: No hernia or ascites is noted. 2.1 cm mildly lobulated soft tissue density is seen superior to the right kidney which was not present on prior exam. It demonstrates average Hounsfield measurement of 22. Musculoskeletal: No acute or significant osseous findings. IMPRESSION: 2.1 cm abnormal soft tissue  density is seen posterior to the liver as described on prior CT scan of August 21, 2018, but not visualized on prior exam of Dec 22, 2010. Possible malignancy or metastatic disease can not be excluded. 14 mm left adrenal nodule is noted which was present on prior exam of 2012 and most consistent with benign adenoma. Mild prostatic enlargement. Aortic Atherosclerosis (ICD10-I70.0). Electronically Signed   By: Marijo Conception, M.D.   On: 08/23/2018 11:51        Scheduled Meds: . [START ON 08/25/2018] etoposide  100 mg/m2 (Treatment Plan Recorded) Intravenous Once  . insulin aspart  0-9 Units Subcutaneous TID WC  . ipratropium-albuterol  3 mL Nebulization BID  . rosuvastatin  10 mg Oral Daily  . [START ON 08/26/2018] Tbo-filgastrim (GRANIX) SQ  300 mcg Subcutaneous Daily   Continuous Infusions: . sodium chloride    . sodium chloride 125 mL/hr at 08/24/18 1722  . [START ON 08/25/2018] dexamethasone (DECADRON) IVPB [facility]       LOS: 1 day    Time spent: over 30  min    Fayrene Helper, MD [facility] Pager see AMION  If 7PM-7AM, please contact night-coverage [email or web address] Password [facility] 08/24/2018, 7:15 PM

## 2018-08-24 NOTE — Progress Notes (Signed)
Subjective: The patient is seen and examined today. His 2 sisters were at the bedside.  He is feeling fine today with no concerning complaints except for mild shortness of breath.  He tolerated the first day of his treatment well with no concerning adverse effects.  He denied having any fever or chills.  He has no nausea, vomiting, diarrhea or constipation.  Objective: Vital signs in last 24 hours: Temp:  [97.6 F (36.4 C)-97.9 F (36.6 C)] 97.9 F (36.6 C) (01/18 0444) Pulse Rate:  [73-91] 73 (01/18 0444) Resp:  [16-18] 16 (01/18 0444) BP: (100-121)/(60-71) 100/60 (01/18 0444) SpO2:  [92 %-99 %] 94 % (01/18 0444)  Intake/Output from previous day: 01/17 0701 - 01/18 0700 In: 786.8 [P.O.:120; I.V.:360.8; IV Piggyback:306] Out: 300 [Urine:300] Intake/Output this shift: No intake/output data recorded.  General appearance: alert, cooperative and no distress Resp: clear to auscultation bilaterally Cardio: regular rate and rhythm, S1, S2 normal, no murmur, click, rub or gallop GI: soft, non-tender; bowel sounds normal; no masses,  no organomegaly Extremities: extremities normal, atraumatic, no cyanosis or edema  Lab Results:  Recent Labs    08/23/18 0325 08/24/18 0306  WBC 9.9 8.7  HGB 13.0 12.8*  HCT 40.6 40.0  PLT 278 262   BMET Recent Labs    08/23/18 0325 08/24/18 0306  NA 140 136  K 3.6 4.1  CL 104 104  CO2 27 21*  GLUCOSE 178* 159*  BUN 19 16  CREATININE 0.81 0.67  CALCIUM 8.5* 8.2*    Studies/Results: Mr Jeri Cos MP Contrast  Result Date: 08/22/2018 CLINICAL DATA:  72 y/o  M; small cell lung cancer staging. EXAM: MRI HEAD WITHOUT AND WITH CONTRAST TECHNIQUE: Multiplanar, multiecho pulse sequences of the brain and surrounding structures were obtained without and with intravenous contrast. CONTRAST:  9 cc Gadavist COMPARISON:  08/21/2018 CT head. FINDINGS: Brain: 3 subcentimeter lesions within the brain demonstrating diffusion hyperintensity in the right external  capsule, right occipital lobe, and right cerebellar hemisphere (series 3 image 14, 20, 25). The lesions within the right cerebellar hemisphere and the right external capsule demonstrate enhancement best appreciated on the sagittal sequence (series 13, image 7 and 9). The right occipital lesion does not demonstrate appreciable enhancement, however, is clearly visible on the T1 noncontrast sequences (series 9 image 20). Mild edema is associated with the right cerebellar lesion. No additional lesion in the brain is identified. Background of punctate nonspecific T2 FLAIR hyperintensities in subcortical and periventricular white matter are compatible with mild chronic microvascular ischemic changes for age. Mild volume loss of the brain. No stroke, hemorrhage, extra-axial collection, hydrocephalus, or significant mass effect. Vascular: Normal flow voids. Skull and upper cervical spine: Normal marrow signal. Sinuses/Orbits: Negative. Other: None. IMPRESSION: 1. 3 small metastasis are present in the right insula, right occipital lobe, and right cerebellar hemisphere. 2. No hemorrhage, mass effect, or stroke identified. 3. Mild for age chronic microvascular ischemic changes and volume loss of the brain. Electronically Signed   By: Kristine Garbe M.D.   On: 08/22/2018 14:42   Ct Abdomen Pelvis W Contrast  Result Date: 08/23/2018 CLINICAL DATA:  History of small cell lung cancer. EXAM: CT ABDOMEN AND PELVIS WITH CONTRAST TECHNIQUE: Multidetector CT imaging of the abdomen and pelvis was performed using the standard protocol following bolus administration of intravenous contrast. CONTRAST:  145mL OMNIPAQUE IOHEXOL 300 MG/ML  SOLN COMPARISON:  CT scan of Dec 22, 2010.  CT scan of August 21, 2018. FINDINGS: Lower chest: Minimal bibasilar subsegmental  atelectasis is noted. 3.9 cm pericardial cyst is noted. Hepatobiliary: No focal liver abnormality is seen. Status post cholecystectomy. No biliary dilatation.  Pancreas: Unremarkable. No pancreatic ductal dilatation or surrounding inflammatory changes. Spleen: Normal in size without focal abnormality. Adrenals/Urinary Tract: 14 mm left adrenal nodule is noted which is slightly enlarged compared to prior exam, most consistent with benign adenoma. Right adrenal gland appears normal. No hydronephrosis or renal obstruction is noted small exophytic cyst is seen arising posteriorly from left kidney. No hydronephrosis or renal obstruction is noted. Mild urinary bladder distention is noted. Stomach/Bowel: Stomach is within normal limits. Appendix appears normal. No evidence of bowel wall thickening, distention, or inflammatory changes. Vascular/Lymphatic: Aortic atherosclerosis. No enlarged abdominal or pelvic lymph nodes. Reproductive: Mild prostatic enlargement is noted. Other: No hernia or ascites is noted. 2.1 cm mildly lobulated soft tissue density is seen superior to the right kidney which was not present on prior exam. It demonstrates average Hounsfield measurement of 22. Musculoskeletal: No acute or significant osseous findings. IMPRESSION: 2.1 cm abnormal soft tissue density is seen posterior to the liver as described on prior CT scan of August 21, 2018, but not visualized on prior exam of Dec 22, 2010. Possible malignancy or metastatic disease can not be excluded. 14 mm left adrenal nodule is noted which was present on prior exam of 2012 and most consistent with benign adenoma. Mild prostatic enlargement. Aortic Atherosclerosis (ICD10-I70.0). Electronically Signed   By: Marijo Conception, M.D.   On: 08/23/2018 11:51   Vas [place] Lower Extremity Venous (dvt)  Result Date: 08/22/2018  Lower Venous Study Indications: History of PE.  Performing Technologist: Oliver Hum RVT  Examination Guidelines: A complete evaluation includes B-mode imaging, spectral Doppler, color Doppler, and power Doppler as needed of all accessible portions of each vessel. Bilateral testing is  considered an integral part of a complete examination. Limited examinations for reoccurring indications may be performed as noted.  Right Venous Findings: +---------+---------------+---------+-----------+----------+-------+          CompressibilityPhasicitySpontaneityPropertiesSummary +---------+---------------+---------+-----------+----------+-------+ CFV      Full           Yes      Yes                          +---------+---------------+---------+-----------+----------+-------+ SFJ      Full                                                 +---------+---------------+---------+-----------+----------+-------+ FV Prox  Full                                                 +---------+---------------+---------+-----------+----------+-------+ FV Mid   Full                                                 +---------+---------------+---------+-----------+----------+-------+ FV DistalFull                                                 +---------+---------------+---------+-----------+----------+-------+  PFV      Full                                                 +---------+---------------+---------+-----------+----------+-------+ POP      Full           Yes      Yes                          +---------+---------------+---------+-----------+----------+-------+ PTV      Full                                                 +---------+---------------+---------+-----------+----------+-------+ PERO     Full                                                 +---------+---------------+---------+-----------+----------+-------+  Left Venous Findings: +---------+---------------+---------+-----------+----------+-------+          CompressibilityPhasicitySpontaneityPropertiesSummary +---------+---------------+---------+-----------+----------+-------+ CFV      Full           Yes      Yes                           +---------+---------------+---------+-----------+----------+-------+ SFJ      Full                                                 +---------+---------------+---------+-----------+----------+-------+ FV Prox  Full                                                 +---------+---------------+---------+-----------+----------+-------+ FV Mid   Full                                                 +---------+---------------+---------+-----------+----------+-------+ FV DistalFull                                                 +---------+---------------+---------+-----------+----------+-------+ PFV      Full                                                 +---------+---------------+---------+-----------+----------+-------+ POP      Full           Yes      Yes                          +---------+---------------+---------+-----------+----------+-------+  PTV      Full                                                 +---------+---------------+---------+-----------+----------+-------+ PERO     Full                                                 +---------+---------------+---------+-----------+----------+-------+    Summary: Right: There is no evidence of deep vein thrombosis in the lower extremity. No cystic structure found in the popliteal fossa. Left: There is no evidence of deep vein thrombosis in the lower extremity. No cystic structure found in the popliteal fossa.  *See table(s) above for measurements and observations. Electronically signed by Servando Snare MD on 08/22/2018 at 3:45:10 PM.    Final    Vas [place] Upper Extremity Venous Duplex  Result Date: 08/22/2018 UPPER VENOUS STUDY  Indications: History of PE Performing Technologist: Oliver Hum RVT  Examination Guidelines: A complete evaluation includes B-mode imaging, spectral Doppler, color Doppler, and power Doppler as needed of all accessible portions of each vessel. Bilateral testing is considered an  integral part of a complete examination. Limited examinations for reoccurring indications may be performed as noted.  Right Findings: +----------+------------+----------+---------+-----------+-------+ RIGHT     CompressiblePropertiesPhasicitySpontaneousSummary +----------+------------+----------+---------+-----------+-------+ IJV           Full                 Yes       Yes            +----------+------------+----------+---------+-----------+-------+ Subclavian    Full                 Yes       Yes            +----------+------------+----------+---------+-----------+-------+ Axillary      Full                 Yes       Yes            +----------+------------+----------+---------+-----------+-------+ Brachial      Full                 Yes       Yes            +----------+------------+----------+---------+-----------+-------+ Radial        Full                                          +----------+------------+----------+---------+-----------+-------+ Ulnar         Full                                          +----------+------------+----------+---------+-----------+-------+ Cephalic    Partial                                  Acute  +----------+------------+----------+---------+-----------+-------+ Basilic       Full                                          +----------+------------+----------+---------+-----------+-------+  Cephalic superficial thrombus surrounding the patient's IV.  Left Findings: +----------+------------+----------+---------+-----------+-------+ LEFT      CompressiblePropertiesPhasicitySpontaneousSummary +----------+------------+----------+---------+-----------+-------+ IJV           Full                 Yes       Yes            +----------+------------+----------+---------+-----------+-------+ Subclavian    Full                 Yes       Yes             +----------+------------+----------+---------+-----------+-------+ Axillary      Full                 Yes       Yes            +----------+------------+----------+---------+-----------+-------+ Brachial      Full                 Yes       Yes            +----------+------------+----------+---------+-----------+-------+ Radial        Full                                          +----------+------------+----------+---------+-----------+-------+ Ulnar         Full                                          +----------+------------+----------+---------+-----------+-------+ Cephalic      Full                                          +----------+------------+----------+---------+-----------+-------+ Basilic       Full                                          +----------+------------+----------+---------+-----------+-------+  Summary:  Right: No evidence of deep vein thrombosis in the upper extremity. Findings consistent with acute superficial vein thrombosis involving the right cephalic vein.  Left: No evidence of deep vein thrombosis in the upper extremity. No evidence of superficial vein thrombosis in the upper extremity.  *See table(s) above for measurements and observations.  Diagnosing physician: Servando Snare MD Electronically signed by Servando Snare MD on 08/22/2018 at 3:48:27 PM.    Final     Medications: I have reviewed the patient's current medications.  Assessment/Plan: This is a very pleasant 73 years old white male recently diagnosed with extensive stage small cell lung cancer.  The patient is currently undergoing systemic chemotherapy with carboplatin and etoposide status post day 1 of cycle #1. The patient tolerated the first day of his treatment well with no concerning adverse effects. I recommended for the patient to proceed with day 2 of his treatment today as scheduled. We will continue to monitor him closely during treatment and the patient will be  considered for treatment with growth factor after completion of the first cycle of his treatment. I also discussed with the patient today the results of his MRI of  the brain as well as the CT scan of the abdomen and pelvis. I will see him tomorrow for reevaluation and monitoring of his blood work. Thank you so much for taking good care of [name].  We will continue to follow up the patient with you and assist in his management.   LOS: 1 day    Eilleen Kempf 08/24/2018

## 2018-08-24 NOTE — Evaluation (Signed)
Clinical/Bedside Swallow Evaluation Patient Details  Name: [name] MRN: [id number] Date of Birth: [birth year]  Today's Date: 08/24/2018 Time: SLP Start Time (ACUTE ONLY): 1520 SLP Stop Time (ACUTE ONLY): 1539 SLP Time Calculation (min) (ACUTE ONLY): 19 min  Past Medical History:  Past Medical History:  Diagnosis Date  . Actinic keratosis   . Arthritis   . Colitis MAY 2012    CT ABD/PELVIS HEP FLEXURE  . COPD (chronic obstructive pulmonary disease) ([place])   . Diabetes mellitus without complication ([place])   . History of kidney stones   . Hyperlipemia   . Hypertension   . NSAID long-term use NAPROXEN FOR OA  . SCL Ca dx'd 08/2018   Past Surgical History:  Past Surgical History:  Procedure Laterality Date  . BACK SURGERY     spinal stenosis  . BASAL CELL CARCINOMA EXCISION  FACE, arms feet, leg  . CHOLECYSTECTOMY  JUNE 2011 [facility]   STONES, PANCREATITIS  . KNEE ARTHROSCOPY WITH MEDIAL MENISECTOMY Right 03/24/2015   Procedure: KNEE ARTHROSCOPY WITH MEDIAL MENISECTOMY;  Surgeon: Carole Civil, MD;  Location: [facility];  Service: Orthopedics;  Laterality: Right;  . KNEE SURGERY Left LEFT   arthroscopy  . LITHOTRIPSY  80s  . SPINE SURGERY     HPI:  Pt is a 73 yo transferred from [facility] with admit for dyspnea, cough, headache.  Concern for inflammatory process in right middle lobe - tree in bud opacity.  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  Being seen by oncology with likely plan for chemoradiation.  Pt has h/o smoking - stopped 2 weeks ago.  He reports dysphagia to MD - stating sensing lodging in sternal region during "episodes of nausea" only;  Swallow eval ordered; MRI head on 08/22/18 noted 3 small metastasis are present in the right insula, right occipital lobe, and right cerebellar hemisphere    Assessment / Plan /  Recommendation Clinical Impression   Pt with one episode of delayed coughing after intake of all consistencies, but otherwise, no overt s/s of aspiration noted throughout evaluation; pt coughing prior to PO intake when SLP entered the room.  Pt c/o globus sensation during "episodes of nausea" only per pt report; Skilled observation of intake with thin via cup/straw-solids with potential esophageal component unable to be ruled out during BSE, but oropharyngeal swallow appeared grossly WFL; ST will f/u for diet tolerance and education re: esophageal precautions prn prior to s/o while in acute setting; thank you for this consult. SLP Visit Diagnosis: Dysphagia, unspecified (R13.10)    Aspiration Risk  Mild aspiration risk    Diet Recommendation   Regular/thin liquids  Medication Administration: Whole meds with liquid    Other  Recommendations Oral Care Recommendations: Oral care BID   Follow up Recommendations None      Frequency and Duration min 1 x/week  1 week       Prognosis Prognosis for Safe Diet Advancement: Good      Swallow Study   General Date of Onset: 08/22/18 HPI: Pt is a 73 yo transferred from [place] with admit for dyspnea, cough, headache.  Concern for inflammatory process in right middle lobe - tree in bud opacity.  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  Being seen by oncology with likely plan for chemoradiation.  Pt has h/o smoking - stopped 2 weeks ago.  He reports dysphagia to MD - stating sensing lodging in sternal region.  Swallow eval ordered.  Type of Study: Bedside Swallow Evaluation Previous Swallow Assessment: (n/a) Diet Prior to this Study: Thin liquids;Regular Temperature Spikes Noted: No Respiratory Status: Room air History of Recent Intubation: No Behavior/Cognition: Alert;Cooperative Oral Cavity Assessment:  Within Functional Limits Oral Care Completed by SLP: No Oral Cavity - Dentition: Adequate natural dentition Vision: Functional for self-feeding Self-Feeding Abilities: Able to feed self Patient Positioning: Upright in bed Baseline Vocal Quality: Normal Volitional Cough: Strong Volitional Swallow: Able to elicit    Oral/Motor/Sensory Function Overall Oral Motor/Sensory Function: Within functional limits   Ice Chips Ice chips: Not tested   Thin Liquid Thin Liquid: Within functional limits Presentation: Cup;Straw    Nectar Thick Nectar Thick Liquid: Not tested   Honey Thick Honey Thick Liquid: Not tested   Puree Puree: Within functional limits Presentation: Self Fed   Solid     Solid: Impaired Presentation: Self Fed Pharyngeal Phase Impairments: Cough - Delayed Other Comments: (Pt coughing prior to intake)      Elvina Sidle, M.S., CCC-SLP 08/24/2018,4:20 PM

## 2018-08-25 LAB — CBC
HEMATOCRIT: 37.8 % — AB (ref 39.0–52.0)
HEMOGLOBIN: 12.1 g/dL — AB (ref 13.0–17.0)
MCH: 31.4 pg (ref 26.0–34.0)
MCHC: 32 g/dL (ref 30.0–36.0)
MCV: 98.2 fL (ref 80.0–100.0)
Platelets: 266 10*3/uL (ref 150–400)
RBC: 3.85 MIL/uL — ABNORMAL LOW (ref 4.22–5.81)
RDW: 13.5 % (ref 11.5–15.5)
WBC: 12.5 10*3/uL — ABNORMAL HIGH (ref 4.0–10.5)
nRBC: 0 % (ref 0.0–0.2)

## 2018-08-25 LAB — COMPREHENSIVE METABOLIC PANEL
ALT: 33 U/L (ref 0–44)
AST: 21 U/L (ref 15–41)
Albumin: 3 g/dL — ABNORMAL LOW (ref 3.5–5.0)
Alkaline Phosphatase: 63 U/L (ref 38–126)
Anion gap: 5 (ref 5–15)
BILIRUBIN TOTAL: 0.6 mg/dL (ref 0.3–1.2)
BUN: 20 mg/dL (ref 8–23)
CO2: 28 mmol/L (ref 22–32)
Calcium: 8.5 mg/dL — ABNORMAL LOW (ref 8.9–10.3)
Chloride: 105 mmol/L (ref 98–111)
Creatinine, Ser: 0.68 mg/dL (ref 0.61–1.24)
GFR calc Af Amer: >60 mL/min (ref >60)
Glucose, Bld: 161 mg/dL — ABNORMAL HIGH (ref 70–99)
Potassium: 5.2 mmol/L — ABNORMAL HIGH (ref 3.5–5.1)
Sodium: 138 mmol/L (ref 135–145)
Total Protein: 5.8 g/dL — ABNORMAL LOW (ref 6.5–8.1)

## 2018-08-25 LAB — GLUCOSE, CAPILLARY
Glucose-Capillary: 147 mg/dL — ABNORMAL HIGH (ref 70–99)
Glucose-Capillary: 137 mg/dL — ABNORMAL HIGH (ref 70–99)
Glucose-Capillary: 169 mg/dL — ABNORMAL HIGH (ref 70–99)
Glucose-Capillary: 131 mg/dL — ABNORMAL HIGH (ref 70–99)

## 2018-08-25 LAB — MAGNESIUM: MAGNESIUM: 2.6 mg/dL — AB (ref 1.7–2.4)

## 2018-08-25 NOTE — Progress Notes (Signed)
PROGRESS NOTE    [name]  VOJ:[id number] DOB: 10-06-1945 DOA: 08/21/2018 PCP: Lucia Gaskins, MD   Brief Narrative:  [name] is [name] 73 y.o. male with medical history significant for HTN, DM, COPD, who presented to the ED with complaints of intermittent episodes of difficulty breathing and chest pain involving his upper chest from above his axilla towards his neck.  Over the past 3 weeks he has had 2 syncopal episodes and 2 near syncopal episodes last episode was this morning.  Difficulty breathing and chest pain is associated with headache and diaphoresis.  No aggravating or relieving factors.  He also feels like his throat is closing up and his chest is congested.  Patient also reports [name] mass in his right axilla, noticed about 2 to 3 months ago, sister reports within the past week the size of the mass has doubled.  He denies obvious swelling of his face or neck. He is not on home O2.  He also reports productive cough.  No fevers or chills. Patient was recently admitted at [facility], 1/9- 08/24/18 for similar symptoms, after another episode of nearly passing out.  Patient was diagnosed with [name] small pulmonary embolism and started on Eliquis he also had an FNA done-sample was taken from his right axilla.  Results of which are still pending.  He also had work-up for syncope-ultrasound of his heart and neck. He was supposed to follow-up with an oncologist at [facility] [place] [facility] 08/26/17.   ED Course: Blood pressure systolic 99-371.  On room air.  Chest x-ray-chronically thickened bronchovascular markings and enlarged pulmonary arteries suggesting pulmonary hypertension, soft tissue control both right superior mediastinum, chest CT recommended.  This point CTA shows bulky mediastinal bilateral hilar and bilateral axillary lymphadenopathy, possibly Hodgkin's lymphoma.  No PE.  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  Initial EKG showed T wave inversion in V2, not evident on subsequent EKGs.  Troponin normal.  Patient was given aspirin 500 mill bolus.  Hospitalist to admit for chest pain.  Assessment & Plan:   Active Problems:   SVC syndrome   Thoracic ascending aortic aneurysm (HCC)   Lymphadenopathy   Bulky lymphadenopathy with SVC syndrome  Metastatic Small Cell Lung Cancer:   - CT scan with bulky mediastinal, bilateral hilar, bilateral axillary lymphadenopathy with severe stenosis involving upper and mid SVC due to compression by mediastinal nodal mass.  Also with 1.6 cm mass adjacent to R lobe of liver (see report) - Radiation oncology has been consulted, appreciate recs - planning to proceed with radiation therapy - plan for 2 weeks of radiotherapy - Oncology consulted, appreciate recs, planning for chemotherapy in house (carboplatin/etoposide).  Day#3 chemo today.  Growth factor support ordered to start on Jan 20 by oncology. - MRI brain with 3 small metastasis in R insula, R occipital lobe, and R cerebellar hemisphere - CT abdomen with 2.1 cm abnormal soft tissue density as well as adrenal nodule - Plan is for him to f/u at [facility] after hospitalization - SLP  Pulmonary embolism  -recent diagnosis at [facility].  CTA chest here without PE (discussed with rads who agreed, no PE on our imaging).  Will stop anticoagulaton, follow UE and LE ultrasounds (no DVT in upper or lower extremity, did have SVT in R upper extremity in  cephalic vein).    COPD -between episodes of chest pain and difficulty breathing patient's has no difficulty breathing.  But has Germain Koopmann productive cough.  No wheezing on exam.  Chest CT with tree in bud opacities in R middle lobe and severe central bronchial wall thickening suggesting asthma and/or bronchitis.  Patient was  treated with steroids and antibiotics azithromycin at [facility].  Shortness of breath with mild 2 L O2 requirement at that time was attributed to bronchitis and small PE.  Pt appears stable at this time without wheezing on exam. -Duo nebs scheduled and PRN -Mucolytics  Ascending thoracic aortic aneurysm- incidental finding on chest CTA measuring approximately 4.6 cm.  Semi-and well follow-up with CTA or MRA and referral to cardiothoracic surgery recommended. - F/u as outpt  HTN-systolic blood pressure 29-937.  -Hold home verapamil, telmisartan HCTZ  DM-glucose 92. -Hold home metformin - SSI  DVT prophylaxis: SCD Code Status: DNR Family Communication: children, grandchildren at bedside Disposition Plan: Pt with metastatic small cell and SVC syndrome requiring chemo/radiation therapy.  On discussion with radiation oncology, given concern for possibility of rebound respiratory distress in these patients, recommending to keep pt hospitalized to follow response after 3 fractions.  Anticipating stay until early next week.  Pt will require inpatient treatment given need for close monitoring with initiation of radiation therapy.  Consultants:   Rad onc  oncology  Procedures:  LE [place] Summary: Right: There is no evidence of deep vein thrombosis in the lower extremity. No cystic structure found in the popliteal fossa. Left: There is no evidence of deep vein thrombosis in the lower extremity. No cystic structure found in the popliteal fossa.  UE [place] Right: No evidence of deep vein thrombosis in the upper extremity. Findings consistent with acute superficial vein thrombosis involving the right cephalic vein.   Left: No evidence of deep vein thrombosis in the upper extremity. No evidence of superficial vein thrombosis in the upper extremity.     Antimicrobials:  Anti-infectives (From admission, onward)   None         Subjective: Feels better.  No spells  recently.  Objective: Vitals:   08/24/18 2100 08/25/18 0434 08/25/18 0626 08/25/18 1236  BP: 119/76 107/61  131/78  Pulse: 72 65  63  Resp: 16 18  18   Temp: 97.7 F (36.5 C) 98 F (36.7 C)  97.7 F (36.5 C)  TempSrc: Oral Oral  Oral  SpO2: 92% 94% 95% 99%  Weight:      Height:        Intake/Output Summary (Last 24 hours) at 08/25/2018 1237 Last data filed at 08/25/2018 0434 Gross per 24 hour  Intake 3154.83 ml  Output 200 ml  Net 2954.83 ml   Filed Weights   08/21/18 1230 08/22/18 2126  Weight: 93 kg 93.8 kg    Examination:  General: No acute distress. Cardiovascular: Heart sounds show Cayce Quezada regular rate, and rhythm Lungs: Clear to auscultation bilaterally  Abdomen: Soft, nontender, nondistended Neurological: Alert and oriented 3. Moves all extremities 4. Cranial nerves II through XII grossly intact. Skin: Warm and dry. No rashes or lesions. Extremities: No clubbing or cyanosis. No edema. Psychiatric: Mood and affect are normal. Insight and judgment are appropriate.    Data Reviewed: I have personally reviewed following labs and imaging studies  CBC: Recent Labs  Lab 08/21/18 1254 08/22/18 1234 08/23/18 0325 08/24/18 0306 08/25/18 0516  WBC 9.8 10.9* 9.9 8.7 12.5*  NEUTROABS  --   --  7.3  --   --  HGB 13.3 13.1 13.0 12.8* 12.1*  HCT 41.1 40.7 40.6 40.0 37.8*  MCV 94.3 95.1 96.4 97.3 98.2  PLT 315 309 278 262 161   Basic Metabolic Panel: Recent Labs  Lab 08/21/18 1254 08/22/18 1234 08/23/18 0325 08/24/18 0306 08/25/18 0516  NA 138 139 140 136 138  K 4.0 3.7 3.6 4.1 5.2*  CL 100 107 104 104 105  CO2 28 25 27  21* 28  GLUCOSE 92 124* 178* 159* 161*  BUN 24* 19 19 16 20   CREATININE 0.93 0.77 0.81 0.67 0.68  CALCIUM 9.4 8.7* 8.5* 8.2* 8.5*  MG  --   --  2.4 2.3 2.6*   GFR: Estimated Creatinine Clearance: 97.6 mL/min (by C-G formula based on SCr of 0.68 mg/dL). Liver Function Tests: Recent Labs  Lab 08/22/18 1234 08/23/18 0325 08/24/18 0306  08/25/18 0516  AST 39 40 32 21  ALT 52* 52* 47* 33  ALKPHOS 67 64 66 63  BILITOT 1.0 0.8 0.7 0.6  PROT 6.3* 6.1* 5.9* 5.8*  ALBUMIN 3.6 3.3* 3.1* 3.0*   No results for input(s): LIPASE, AMYLASE in the last 168 hours. No results for input(s): AMMONIA in the last 168 hours. Coagulation Profile: No results for input(s): INR, PROTIME in the last 168 hours. Cardiac Enzymes: Recent Labs  Lab 08/21/18 1254  TROPONINI <0.03   BNP (last 3 results) No results for input(s): PROBNP in the last 8760 hours. HbA1C: No results for input(s): HGBA1C in the last 72 hours. CBG: Recent Labs  Lab 08/24/18 1202 08/24/18 1714 08/24/18 2109 08/25/18 0725 08/25/18 1144  GLUCAP 156* 175* 169* 147* 137*   Lipid Profile: No results for input(s): CHOL, HDL, LDLCALC, TRIG, CHOLHDL, LDLDIRECT in the last 72 hours. Thyroid Function Tests: No results for input(s): TSH, T4TOTAL, FREET4, T3FREE, THYROIDAB in the last 72 hours. Anemia Panel: No results for input(s): VITAMINB12, FOLATE, FERRITIN, TIBC, IRON, RETICCTPCT in the last 72 hours. Sepsis Labs: No results for input(s): PROCALCITON, LATICACIDVEN in the last 168 hours.  No results found for this or any previous visit (from the past 240 hour(s)).       Radiology Studies: No results found.      Scheduled Meds: . etoposide  100 mg/m2 (Treatment Plan Recorded) Intravenous Once  . insulin aspart  0-9 Units Subcutaneous TID WC  . ipratropium-albuterol  3 mL Nebulization BID  . rosuvastatin  10 mg Oral Daily  . [START ON 08/26/2018] Tbo-filgastrim (GRANIX) SQ  300 mcg Subcutaneous Daily   Continuous Infusions: . dexamethasone (DECADRON) IVPB [facility]       LOS: 2 days    Time spent: over 30 min    Fayrene Helper, MD [facility] Pager see AMION  If 7PM-7AM, please contact night-coverage [email or web address] Password [facility] 08/25/2018, 12:37 PM

## 2018-08-25 NOTE — Progress Notes (Signed)
Subjective: The patient is seen and examined today.  His sister was at the bedside.  He is feeling fine today with no concerning complaints.  He tolerated the second day of his treatment was etoposide fairly well.  He denied having any nausea, vomiting, diarrhea or constipation.  He denied having any fever or chills.  Objective: Vital signs in last 24 hours: Temp:  [97.7 F (36.5 C)-98 F (36.7 C)] 98 F (36.7 C) (01/19 0434) Pulse Rate:  [65-72] 65 (01/19 0434) Resp:  [16-18] 18 (01/19 0434) BP: (107-119)/(61-76) 107/61 (01/19 0434) SpO2:  [90 %-95 %] 95 % (01/19 0626)  Intake/Output from previous day: 01/18 0701 - 01/19 0700 In: 3394.8 [P.O.:960; I.V.:1350.6; IV Piggyback:1084.2] Out: 200 [Urine:200] Intake/Output this shift: No intake/output data recorded.  General appearance: alert, cooperative and no distress Resp: clear to auscultation bilaterally Cardio: regular rate and rhythm, S1, S2 normal, no murmur, click, rub or gallop GI: soft, non-tender; bowel sounds normal; no masses,  no organomegaly Extremities: extremities normal, atraumatic, no cyanosis or edema  Lab Results:  Recent Labs    08/24/18 0306 08/25/18 0516  WBC 8.7 12.5*  HGB 12.8* 12.1*  HCT 40.0 37.8*  PLT 262 266   BMET Recent Labs    08/24/18 0306 08/25/18 0516  NA 136 138  K 4.1 5.2*  CL 104 105  CO2 21* 28  GLUCOSE 159* 161*  BUN 16 20  CREATININE 0.67 0.68  CALCIUM 8.2* 8.5*    Studies/Results: Ct Abdomen Pelvis W Contrast  Result Date: 08/23/2018 CLINICAL DATA:  History of small cell lung cancer. EXAM: CT ABDOMEN AND PELVIS WITH CONTRAST TECHNIQUE: Multidetector CT imaging of the abdomen and pelvis was performed using the standard protocol following bolus administration of intravenous contrast. CONTRAST:  145mL OMNIPAQUE IOHEXOL 300 MG/ML  SOLN COMPARISON:  CT scan of Dec 22, 2010.  CT scan of August 21, 2018. FINDINGS: Lower chest: Minimal bibasilar subsegmental atelectasis is noted.  3.9 cm pericardial cyst is noted. Hepatobiliary: No focal liver abnormality is seen. Status post cholecystectomy. No biliary dilatation. Pancreas: Unremarkable. No pancreatic ductal dilatation or surrounding inflammatory changes. Spleen: Normal in size without focal abnormality. Adrenals/Urinary Tract: 14 mm left adrenal nodule is noted which is slightly enlarged compared to prior exam, most consistent with benign adenoma. Right adrenal gland appears normal. No hydronephrosis or renal obstruction is noted small exophytic cyst is seen arising posteriorly from left kidney. No hydronephrosis or renal obstruction is noted. Mild urinary bladder distention is noted. Stomach/Bowel: Stomach is within normal limits. Appendix appears normal. No evidence of bowel wall thickening, distention, or inflammatory changes. Vascular/Lymphatic: Aortic atherosclerosis. No enlarged abdominal or pelvic lymph nodes. Reproductive: Mild prostatic enlargement is noted. Other: No hernia or ascites is noted. 2.1 cm mildly lobulated soft tissue density is seen superior to the right kidney which was not present on prior exam. It demonstrates average Hounsfield measurement of 22. Musculoskeletal: No acute or significant osseous findings. IMPRESSION: 2.1 cm abnormal soft tissue density is seen posterior to the liver as described on prior CT scan of August 21, 2018, but not visualized on prior exam of Dec 22, 2010. Possible malignancy or metastatic disease can not be excluded. 14 mm left adrenal nodule is noted which was present on prior exam of 2012 and most consistent with benign adenoma. Mild prostatic enlargement. Aortic Atherosclerosis (ICD10-I70.0). Electronically Signed   By: Marijo Conception, M.D.   On: 08/23/2018 11:51    Medications: I have reviewed the patient's current medications.  Assessment/Plan: This is a very pleasant 73 years old white male recently diagnosed with extensive stage small cell lung cancer. The patient is  currently undergoing systemic chemotherapy with carboplatin and etoposide.  He tolerated the first 2 days of his treatment fairly well. I recommended for the patient to proceed with day #3 today as scheduled. We will continue to monitor his blood count and chemistry closely for tumor lysis.  The patient will probably discharge in the next few days followed by treatment with growth factor on outpatient basis. Dr. Alen Blew will see the patient starting tomorrow.  LOS: 2 days    Eilleen Kempf 08/25/2018

## 2018-08-26 ENCOUNTER — Ambulatory Visit
Admit: 2018-08-26 | Discharge: 2018-08-26 | Disposition: A | Discharge status: Home / Self Care | Payer: Medicare Other | Attending: Radiation Oncology | Admitting: Radiation Oncology

## 2018-08-26 ENCOUNTER — Other Ambulatory Visit: Payer: Self-pay | Admitting: Oncology

## 2018-08-26 ENCOUNTER — Encounter (HOSPITAL_COMMUNITY): Payer: Self-pay | Admitting: *Deleted

## 2018-08-26 DIAGNOSIS — C3491 Malignant neoplasm of unspecified part of right bronchus or lung: Secondary | ICD-10-CM

## 2018-08-26 LAB — COMPREHENSIVE METABOLIC PANEL
ALT: 27 U/L (ref 0–44)
AST: 19 U/L (ref 15–41)
Albumin: 3 g/dL — ABNORMAL LOW (ref 3.5–5.0)
Alkaline Phosphatase: 58 U/L (ref 38–126)
Anion gap: 6 (ref 5–15)
BUN: 20 mg/dL (ref 8–23)
CO2: 28 mmol/L (ref 22–32)
Calcium: 8.5 mg/dL — ABNORMAL LOW (ref 8.9–10.3)
Chloride: 104 mmol/L (ref 98–111)
Creatinine, Ser: 0.61 mg/dL (ref 0.61–1.24)
GFR calc Af Amer: >60 mL/min (ref >60)
GFR calc non Af Amer: >60 mL/min (ref >60)
Glucose, Bld: 117 mg/dL — ABNORMAL HIGH (ref 70–99)
Potassium: 4.6 mmol/L (ref 3.5–5.1)
Sodium: 138 mmol/L (ref 135–145)
Total Bilirubin: 0.9 mg/dL (ref 0.3–1.2)
Total Protein: 5.6 g/dL — ABNORMAL LOW (ref 6.5–8.1)

## 2018-08-26 LAB — CBC
HEMATOCRIT: 37.3 % — AB (ref 39.0–52.0)
Hemoglobin: 12.2 g/dL — ABNORMAL LOW (ref 13.0–17.0)
MCH: 31.1 pg (ref 26.0–34.0)
MCHC: 32.7 g/dL (ref 30.0–36.0)
MCV: 95.2 fL (ref 80.0–100.0)
PLATELETS: 288 10*3/uL (ref 150–400)
RBC: 3.92 MIL/uL — ABNORMAL LOW (ref 4.22–5.81)
RDW: 13.3 % (ref 11.5–15.5)
WBC: 9.8 10*3/uL (ref 4.0–10.5)
nRBC: 0 % (ref 0.0–0.2)

## 2018-08-26 LAB — GLUCOSE, CAPILLARY
Glucose-Capillary: 185 mg/dL — ABNORMAL HIGH (ref 70–99)
Glucose-Capillary: 124 mg/dL — ABNORMAL HIGH (ref 70–99)

## 2018-08-26 LAB — MAGNESIUM: Magnesium: 2.4 mg/dL (ref 1.7–2.4)

## 2018-08-26 NOTE — Progress Notes (Signed)
  Radiation Oncology         [phone] ________________________________  Name: [name] MRN: [id number]  Date: 08/22/2018  DOB: 1945-11-17  SIMULATION AND TREATMENT PLANNING NOTE  DIAGNOSIS:     ICD-10-CM   1. 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  Identity was confirmed.  All relevant records and images related to the planned course of therapy were reviewed.   Written consent to proceed with treatment was confirmed which was freely given after reviewing the details related to the planned course of therapy had been reviewed with the patient.  Then, the patient was set-up in a stable reproducible  supine position for radiation therapy.  CT images were obtained.  Surface markings were placed.    Medically necessary complex treatment device(s) for immobilization:  Customized vac-lock bag.   The CT images were loaded into the planning software.  Then the target and avoidance structures were contoured.  Treatment planning then occurred.  The radiation prescription was entered and confirmed.  A total of 3 complex treatment devices were fabricated which relate to the designed radiation treatment fields. Additional reduced fields will be used as necessary to improve the dose homogeneity of the plan. Each of these customized fields/ complex treatment devices will be used on a daily basis during the radiation course. I have requested : 3D Simulation  I have requested a DVH of the following structures: target volume, spinal cord, lungs, heart.   The patient will undergo daily image guidance to ensure accurate localization of the target, and adequate minimize dose to the normal surrounding structures in close proximity to the target.   PLAN:  The patient will receive 30 Gy in 10 fractions.    ________________________________   Jodelle Gross, MD, PhD

## 2018-08-26 NOTE — Care Management Important Message (Signed)
Important Message  Patient Details  Name: QUENCY TOBER MRN: [id number] Date of Birth: August 06, 1946   Medicare Important Message Given:  Yes    Kerin Salen 08/26/2018, 11:23 [place] Message  Patient Details  Name: DERRIS MILLAN MRN: [id number] Date of Birth: [birth year]   Medicare Important Message Given:  Yes    Kerin Salen 08/26/2018, 11:23 AM

## 2018-08-26 NOTE — Discharge Summary (Signed)
Physician Discharge Summary  KALAB CAMPS OEV:[id number] DOB: 1946/07/12 DOA: 08/21/2018  PCP: Lucia Gaskins, MD  Admit date: 08/21/2018 Discharge date: 08/26/2018  Time spent: 35 minutes  Recommendations for Outpatient Follow-up:  1. Follow up outpatient CBC/CMP 2. Ensure follow up for neulasta 3. Ensure follow up with oncology/radiation oncology 4. Follow up aneurysm as outpatient as recommended by radiology  Discharge Diagnoses:  Active Problems:   SVC syndrome   Thoracic ascending aortic aneurysm (HCC)   Lymphadenopathy   Discharge Condition: stable  Diet recommendation: heart healthy  Filed Weights   08/21/18 1230 08/22/18 2126  Weight: 93 kg 93.8 kg    History of present illness:  [name] a 73 y.o.malewith medical history significantfor HTN, DM, COPD,who presented to the ED with complaints of intermittent episodes of difficulty breathing and chest pain involving his upper chest from above his axilla towards his neck.Over the past 3 weeks he has had 2 syncopal episodes and 2 near syncopal episodes last episode was this morning. Difficulty breathing and chest pain is associated with headache and diaphoresis.No aggravating or relieving factors. He also feels like his throat is closing up and his chest is congested. Patient also reports a mass in his right axilla,noticed about 2 to 3 months ago,sister reports within the past week the size of the mass has doubled. He denies obviousswelling of his faceorneck. He is not on home O2. He also reports productive cough. No fevers or chills. [facility],after another episode of nearly passing out.Patient was diagnosed with a small pulmonary embolism and started on Eliquis he also had anFNA done-sample was taken from his right axilla. Results of which are still pending. He also had work-up for syncope-ultrasound of his heart and neck.  [facility]  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  He received his first 3 doses of radiation in house and his first round of chemotherapy while admitted.  He was doing well on the day of discharge and plan to follow up with oncology and radiation oncology as an outpatient.  Hospital Course:  Bulky lymphadenopathywith SVC syndrome  Extensive Stage Small Cell Lung Cancer:   - CT scan with bulky mediastinal, bilateral hilar, bilateral axillary lymphadenopathy with severe stenosis involving upper and mid SVC due to compression by mediastinal nodal mass.  Also with 1.6 cm mass adjacent to R lobe of liver (see report) - Radiation oncology has been consulted, appreciate recs - planning to proceed with radiation therapy - plan for 2 weeks of radiotherapy - Oncology consulted, appreciate recs - s/p cycle 1 chemotherapy.  Will follow up at [facility] as outpatient.  Discussed discharge with Dr. Osker Mason, he notes he'll help arrange neulasta as outpatient.     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 CTA chest here without PE (discussed with rads who agreed, no PE on our imaging).  Will stop anticoagulaton, follow UE and LE ultrasounds (no DVT in upper or lower extremity, did have SVT in R upper extremity in cephalic vein).  Continue to monitor.  COPD - no resp sx at discharge  Ascending thoracic aortic aneurysm-incidentalfinding on chest CTA measuring approximately 4.6 cm. Semi-and well follow-up with CTA or MRA and referral to cardiothoracic surgery recommended. - F/u as outpt,  discussed with pt  HTN- Resume BP meds at discharge  DM-glucose 92. Resume DM meds  Procedures: 1/16 Summary: Right: There is no evidence of deep vein thrombosis in the lower extremity. No cystic structure found in the popliteal fossa. Left: There is no evidence of deep vein thrombosis in the lower extremity. No cystic structure found in the popliteal fossa.  Right: No evidence of deep vein thrombosis in the upper extremity. Findings consistent with acute superficial vein thrombosis involving the right cephalic vein.   Left: No evidence of deep vein thrombosis in the upper extremity. No evidence of superficial vein thrombosis in the upper extremity.  Consultations:  Oncology  Rad onc  Discharge Exam: Vitals:   08/26/18 0603 08/26/18 0903  BP: 116/67   Pulse: 74 65  Resp: 20 18  Temp: 97.7 F (36.5 C)   SpO2: 95% 97%   No complaints. Feeling well. Family at bedside. Discussed with oncology/rad onc, both ok with d/c today.  General: No acute distress. Cardiovascular: Heart sounds show a regular rate, and rhythm.  Lungs: Clear to auscultation bilaterally Abdomen: Soft, nontender, nondistended  Neurological: Alert and oriented 3. Moves all extremities 4. Cranial nerves II through XII grossly intact. Skin: Warm and dry. No rashes or lesions. Extremities: No clubbing or cyanosis. No edema. Psychiatric: Mood and affect are normal. Insight and judgment are appropriate.  Discharge Instructions   Discharge Instructions    Call MD for:  difficulty breathing, headache or visual disturbances   Complete by:  As directed    Call MD for:  extreme fatigue   Complete by:  As directed    Call MD for:  hives   Complete by:  As directed    Call MD for:  persistant dizziness or light-headedness   Complete by:  As directed    Call MD for:  persistant nausea and vomiting   Complete by:  As directed    Call MD for:  redness, tenderness, or signs of infection (pain,  swelling, redness, odor or green/yellow discharge around incision site)   Complete by:  As directed    Call MD for:  severe uncontrolled pain   Complete by:  As directed    Call MD for:  temperature >100.4   Complete by:  As directed    Diet - low sodium heart healthy   Complete by:  As directed    Discharge instructions   Complete by:  As directed    You were seen for 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  You've begun chemotherapy and radiation treatment.  Please continue to follow up with radiation oncology and oncology.  Dr. Alen Blew will arrange follow up with you at [facility] oncology (you'll need to follow up for continued treatment there).  They should schedule and appointment within the next day or so for neulasta.    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    We did not see a pulmonary embolism on our imaging.  There were no deep venous thrombosis (blood clots) on your ultrasounds.  I don't think you need to take a blood thinner.  Please follow up with oncology and your PCP.  You had an aneurysm that should be followed  as an outpatient.  Please discuss this with your PCP.  Return for new, recurrent, or worsening symptoms.  Please ask your PCP to request records from this hospitalization so they know what was done and what the next steps will be.   Increase activity slowly   Complete by:  As directed      Allergies as of 08/26/2018   No Known Allergies     Medication List    STOP taking these medications   ELIQUIS 5 MG Tabs tablet Generic drug:  apixaban     TAKE these medications   albuterol 108 (90 Base) MCG/ACT inhaler Commonly known as:  PROVENTIL HFA;VENTOLIN HFA INHALE 2 PUFFS INTO THE LUNGS QID   aspirin 81 MG tablet Take 81 mg by mouth daily.   Cholecalciferol 50 MCG (2000 UT) Tbdp Take 1 tablet by mouth daily.    metFORMIN 500 MG tablet Commonly known as:  GLUCOPHAGE Take 500 mg by mouth 2 (two) times daily with a meal.   multivitamin tablet Take 1 tablet by mouth daily.   rosuvastatin 10 MG tablet Commonly known as:  CRESTOR Take 10 mg by mouth daily.   telmisartan-hydrochlorothiazide 40-12.5 MG tablet Commonly known as:  MICARDIS HCT Take 1 tablet by mouth daily.   verapamil 240 MG (CO) 24 hr tablet Commonly known as:  COVERA HS Take 240 mg by mouth at bedtime.      No Known Allergies Follow-up Information    Lucia Gaskins, MD Follow up.   Specialty:  Internal Medicine Contact information: [place] [address] [phone]        Derek Jack, MD Follow up.   Specialty:  Hematology Why:  Please follow up as outpatient with oncology at [facility] information: [place] [address] [phone]        Wyatt Portela, MD Follow up.   Specialty:  Oncology Contact information: [place] [address] [phone]        Kyung Rudd, MD Follow up.   Specialty:  Radiation Oncology Contact information: [address] [place] [address] [phone]            The results of significant diagnostics from this hospitalization (including imaging, microbiology, ancillary and laboratory) are listed below for reference.    Significant Diagnostic Studies: Dg Chest 2 View  Result Date: 08/21/2018 CLINICAL DATA:  Chest pain, sob, nausea, and headache intermittently for over a week. Was seen and d/c from [facility] last week with same s/s. Hx COPD, HTN, DM, pulm embolism. Smoker. EXAM: CHEST - 2 VIEW COMPARISON:  07/25/2018 and older exams. FINDINGS: Cardiac silhouette is top-normal in size. There is a smooth concave soft tissue bulge from the right superior mediastinum, most likely vascular, but more prominent than on prior exams. There is no deviation or mass effect on the trachea.  Mediastinum otherwise normal in contour. Main pulmonary arteries appear enlarged. There are thickened bronchovascular markings bilaterally stable from the prior exam. No evidence of pneumonia or pulmonary edema. No pleural effusion or pneumothorax. Skeletal structures are intact. IMPRESSION: 1. No acute cardiopulmonary disease. 2. Chronically thickened bronchovascular markings and enlarged pulmonary arteries, the latter suggesting pulmonary hypertension. 3. Soft tissue contour bulge along the right superior mediastinum that is most likely vascular. This could be further assessed with chest CT. Electronically Signed   By: Lajean Manes M.D.   On: 08/21/2018 12:56   Ct Head Wo Contrast  Result Date: 08/21/2018  CLINICAL DATA:  Chest pain, sob and headache intermittently for over a week. Was seen and d/c from [facility] last week with same s/s. Has not seen pcp, has an appointment on Friday. EXAM: CT HEAD WITHOUT CONTRAST TECHNIQUE: Contiguous axial images were obtained from the base of the skull through the vertex without intravenous contrast. COMPARISON:  None. FINDINGS: Brain: No evidence of acute infarction, hemorrhage, hydrocephalus, extra-axial collection or mass lesion/mass effect. There are mild areas of white matter hypoattenuation consistent with chronic microvascular ischemic change. Vascular: No hyperdense vessel or unexpected calcification. Skull: Normal. Negative for fracture or focal lesion. Sinuses/Orbits: Globes and orbits are unremarkable. The sinuses and mastoid air cells are clear. Other: None. IMPRESSION: 1. No acute intracranial abnormalities. 2. Mild chronic microvascular ischemic change. Electronically Signed   By: Lajean Manes M.D.   On: 08/21/2018 14:43   Ct Angio Chest Pe W And/or Wo Contrast  Result Date: 08/21/2018 CLINICAL DATA:  One-week history of intermittent chest pain, shortness of breath and headaches. EXAM: CT ANGIOGRAPHY CHEST WITH CONTRAST TECHNIQUE: Multidetector  CT imaging of the chest was performed using the standard protocol during bolus administration of intravenous contrast. Multiplanar CT image reconstructions and MIPs were obtained to evaluate the vascular anatomy. CONTRAST:  137mL ISOVUE-370 IOPAMIDOL INJECTION 76% IV. COMPARISON:  CT chest 01/06/2010. FINDINGS: Cardiovascular: Contrast opacification of the pulmonary arterial system and the systemic arterial system good and equivalent. No filling defects within either main pulmonary artery or their segmental branches in either lung to suggest pulmonary embolism. Heart moderately enlarged. Dense mitral valvular calcifications. Moderate to severe three-vessel coronary atherosclerosis. No pericardial effusion. Moderate atherosclerosis involving the thoracic and proximal abdominal aorta. Ascending thoracic aortic aneurysm measuring up to approximately 4.6 cm. Atherosclerosis at the origin of the great vessels without evidence of stenosis. Direct origin of the LEFT vertebral artery from the aortic arch. Severe narrowing of the proximal and mid SVC by massive lymphadenopathy which will be detailed below. Numerous collaterals are present in the RIGHT UPPER chest and back (contrast injection was performed in the RIGHT UPPER extremity). Mediastinum/Nodes: Massive mediastinal, BILATERAL hilar and BILATERAL axillary lymphadenopathy. Enlarged lymph nodes are present at all 3 levels of both the RIGHT and LEFT axilla. BILATERAL supraclavicular lymphadenopathy is also present. Index conglomerate RIGHT UPPER paratracheal lymph nodes (station 2R) measure approximately 8.8 x 4.7 cm (series 5, image 23). Conglomerate RIGHT axillary lymph nodes measure approximately 4.9 x 4.3 cm (image 48). Conglomerate low LEFT axillary lymph nodes measure approximately 6.4 x 3.4 cm (image 53). Conglomerate subcarinal mediastinal (station 7 lymphadenopathy measures approximately 3.4 x 7.9 cm (image 54). Conglomerate RIGHT axillary lymph nodes measure  approximately 5.1 x 6.1 cm (image 14).Index conglomerate LEFT axillary nodal mass measures approximately 4.1 x 7.8 cm on coronal image 71. Pericardial/duplication cyst adjacent to the RIGHT atrium in the LOWER mediastinum measures approximately 4.6 x 2.9 cm, slightly increased in size since the prior CT. Ormal appearing esophagus which I suspect is compressed by the massive mediastinal lymphadenopathy. Normal-appearing thyroid gland. Lungs/Pleura: Mild emphysematous changes throughout both lungs. Tree in bud nodular opacities in the RIGHT MIDDLE LOBE. Lungs otherwise clear. No confluent airspace consolidation. No evidence of interstitial lung disease. Subpleural lipoma involving the LEFT LATERAL chest wall. No pleural effusions. Central airways patent with moderate to severe bronchial wall thickening. The LEFT UPPER LOBE and LEFT LOWER LOBE bronchi are compressed by the bulky lymphadenopathy. The RIGHT lobar bronchi do not appear significantly compressed. Upper Abdomen: Approximate 1.5 x 1.6 cm mass  in the fat adjacent to the RIGHT lobe of the liver (image 95). Normal sized celiac axis and gastrohepatic ligament lymph nodes; no pathologic lymphadenopathy in the upper abdomen. No retrocrural lymphadenopathy. At least 3 accessory splenules in the Muse. Mild pancreatic atrophy. Surgically absent gallbladder. Musculoskeletal: Mild-to-moderate diffuse thoracic spine degenerative disc disease and spondylosis. Degenerative disc disease and spondylosis involving the visualized LOWER cervical spine. No acute findings. No evidence of osseous metastatic disease. Review of the MIP images confirms the above findings. IMPRESSION: 1. Bulky mediastinal, BILATERAL hilar and BILATERAL axillary lymphadenopathy, with index conglomerate nodal masses measured above. Lymphoma is suspected, possibly Hodgkin's lymphoma. 2. No evidence of pulmonary embolism. 3. Severe stenosis involving the upper and mid SVC due to compression  by the mediastinal nodal mass. 4. Approximate 1.6 cm mass involving the fat adjacent to the RIGHT lobe of the liver. No evidence of pathologic lymphadenopathy in the visualized upper abdomen. 5. Tree in bud opacities in the RIGHT MIDDLE LOBE, likely an acute inflammatory process. Severe central bronchial wall thickening indicates asthma and/or bronchitis. No acute cardiopulmonary disease otherwise. 6. Benign pericardial/duplication cyst adjacent to the RIGHT atrium. 7. Ascending thoracic aortic aneurysm measuring approximately 4.6 cm diameter. Recommend semi-annual imaging followup by CTA or MRA and referral to cardiothoracic surgery if not already obtained. This recommendation follows 2010 ACCF/AHA/AATS/ACR/ASA/SCA/SCAI/SIR/STS/SVM Guidelines for the Diagnosis and Management of Patients With Thoracic Aortic Disease. Circulation. 2010; 121: U202-R427. 8. Moderate cardiomegaly. Dense aortic valvular calcifications. Moderate three-vessel coronary atherosclerosis. Aortic Atherosclerosis (ICD10-I70.0) and Emphysema (ICD10-J43.9). Aortic aneurysm NOS (ICD10-I71.9). Electronically Signed   By: Evangeline Dakin M.D.   On: 08/21/2018 15:09   Mr Jeri Cos CW Contrast  Result Date: 08/22/2018 CLINICAL DATA:  73 y/o  M; small cell lung cancer staging. EXAM: MRI HEAD WITHOUT AND WITH CONTRAST TECHNIQUE: Multiplanar, multiecho pulse sequences of the brain and surrounding structures were obtained without and with intravenous contrast. CONTRAST:  9 cc Gadavist COMPARISON:  08/21/2018 CT head. FINDINGS: Brain: 3 subcentimeter lesions within the brain demonstrating diffusion hyperintensity in the right external capsule, right occipital lobe, and right cerebellar hemisphere (series 3 image 14, 20, 25). The lesions within the right cerebellar hemisphere and the right external capsule demonstrate enhancement best appreciated on the sagittal sequence (series 13, image 7 and 9). The right occipital lesion does not demonstrate  appreciable enhancement, however, is clearly visible on the T1 noncontrast sequences (series 9 image 20). Mild edema is associated with the right cerebellar lesion. No additional lesion in the brain is identified. Background of punctate nonspecific T2 FLAIR hyperintensities in subcortical and periventricular white matter are compatible with mild chronic microvascular ischemic changes for age. Mild volume loss of the brain. No stroke, hemorrhage, extra-axial collection, hydrocephalus, or significant mass effect. Vascular: Normal flow voids. Skull and upper cervical spine: Normal marrow signal. Sinuses/Orbits: Negative. Other: None. IMPRESSION: 1. 3 small metastasis are present in the right insula, right occipital lobe, and right cerebellar hemisphere. 2. No hemorrhage, mass effect, or stroke identified. 3. Mild for age chronic microvascular ischemic changes and volume loss of the brain. Electronically Signed   By: Kristine Garbe M.D.   On: 08/22/2018 14:42   Ct Abdomen Pelvis W Contrast  Result Date: 08/23/2018 CLINICAL DATA:  History of small cell lung cancer. EXAM: CT ABDOMEN AND PELVIS WITH CONTRAST TECHNIQUE: Multidetector CT imaging of the abdomen and pelvis was performed using the standard protocol following bolus administration of intravenous contrast. CONTRAST:  175mL OMNIPAQUE IOHEXOL 300 MG/ML  SOLN  COMPARISON:  CT scan of Dec 22, 2010.  CT scan of August 21, 2018. FINDINGS: Lower chest: Minimal bibasilar subsegmental atelectasis is noted. 3.9 cm pericardial cyst is noted. Hepatobiliary: No focal liver abnormality is seen. Status post cholecystectomy. No biliary dilatation. Pancreas: Unremarkable. No pancreatic ductal dilatation or surrounding inflammatory changes. Spleen: Normal in size without focal abnormality. Adrenals/Urinary Tract: 14 mm left adrenal nodule is noted which is slightly enlarged compared to prior exam, most consistent with benign adenoma. Right adrenal gland appears  normal. No hydronephrosis or renal obstruction is noted small exophytic cyst is seen arising posteriorly from left kidney. No hydronephrosis or renal obstruction is noted. Mild urinary bladder distention is noted. Stomach/Bowel: Stomach is within normal limits. Appendix appears normal. No evidence of bowel wall thickening, distention, or inflammatory changes. Vascular/Lymphatic: Aortic atherosclerosis. No enlarged abdominal or pelvic lymph nodes. Reproductive: Mild prostatic enlargement is noted. Other: No hernia or ascites is noted. 2.1 cm mildly lobulated soft tissue density is seen superior to the right kidney which was not present on prior exam. It demonstrates average Hounsfield measurement of 22. Musculoskeletal: No acute or significant osseous findings. IMPRESSION: 2.1 cm abnormal soft tissue density is seen posterior to the liver as described on prior CT scan of August 21, 2018, but not visualized on prior exam of Dec 22, 2010. Possible malignancy or metastatic disease can not be excluded. 14 mm left adrenal nodule is noted which was present on prior exam of 2012 and most consistent with benign adenoma. Mild prostatic enlargement. Aortic Atherosclerosis (ICD10-I70.0). Electronically Signed   By: Marijo Conception, M.D.   On: 08/23/2018 11:51   Vas [place] Lower Extremity Venous (dvt)  Result Date: 08/22/2018  Lower Venous Study Indications: History of PE.  Performing Technologist: Oliver Hum RVT  Examination Guidelines: A complete evaluation includes B-mode imaging, spectral Doppler, color Doppler, and power Doppler as needed of all accessible portions of each vessel. Bilateral testing is considered an integral part of a complete examination. Limited examinations for reoccurring indications may be performed as noted.  Right Venous Findings: +---------+---------------+---------+-----------+----------+-------+          CompressibilityPhasicitySpontaneityPropertiesSummary  +---------+---------------+---------+-----------+----------+-------+ CFV      Full           Yes      Yes                          +---------+---------------+---------+-----------+----------+-------+ SFJ      Full                                                 +---------+---------------+---------+-----------+----------+-------+ FV Prox  Full                                                 +---------+---------------+---------+-----------+----------+-------+ FV Mid   Full                                                 +---------+---------------+---------+-----------+----------+-------+ FV DistalFull                                                 +---------+---------------+---------+-----------+----------+-------+  PFV      Full                                                 +---------+---------------+---------+-----------+----------+-------+ POP      Full           Yes      Yes                          +---------+---------------+---------+-----------+----------+-------+ PTV      Full                                                 +---------+---------------+---------+-----------+----------+-------+ PERO     Full                                                 +---------+---------------+---------+-----------+----------+-------+  Left Venous Findings: +---------+---------------+---------+-----------+----------+-------+          CompressibilityPhasicitySpontaneityPropertiesSummary +---------+---------------+---------+-----------+----------+-------+ CFV      Full           Yes      Yes                          +---------+---------------+---------+-----------+----------+-------+ SFJ      Full                                                 +---------+---------------+---------+-----------+----------+-------+ FV Prox  Full                                                  +---------+---------------+---------+-----------+----------+-------+ FV Mid   Full                                                 +---------+---------------+---------+-----------+----------+-------+ FV DistalFull                                                 +---------+---------------+---------+-----------+----------+-------+ PFV      Full                                                 +---------+---------------+---------+-----------+----------+-------+ POP      Full           Yes      Yes                          +---------+---------------+---------+-----------+----------+-------+  PTV      Full                                                 +---------+---------------+---------+-----------+----------+-------+ PERO     Full                                                 +---------+---------------+---------+-----------+----------+-------+    Summary: Right: There is no evidence of deep vein thrombosis in the lower extremity. No cystic structure found in the popliteal fossa. Left: There is no evidence of deep vein thrombosis in the lower extremity. No cystic structure found in the popliteal fossa.  *See table(s) above for measurements and observations. Electronically signed by Servando Snare MD on 08/22/2018 at 3:45:10 PM.    Final    Vas [place] Upper Extremity Venous Duplex  Result Date: 08/22/2018 UPPER VENOUS STUDY  Indications: History of PE Performing Technologist: Oliver Hum RVT  Examination Guidelines: A complete evaluation includes B-mode imaging, spectral Doppler, color Doppler, and power Doppler as needed of all accessible portions of each vessel. Bilateral testing is considered an integral part of a complete examination. Limited examinations for reoccurring indications may be performed as noted.  Right Findings: +----------+------------+----------+---------+-----------+-------+ RIGHT     CompressiblePropertiesPhasicitySpontaneousSummary  +----------+------------+----------+---------+-----------+-------+ IJV           Full                 Yes       Yes            +----------+------------+----------+---------+-----------+-------+ Subclavian    Full                 Yes       Yes            +----------+------------+----------+---------+-----------+-------+ Axillary      Full                 Yes       Yes            +----------+------------+----------+---------+-----------+-------+ Brachial      Full                 Yes       Yes            +----------+------------+----------+---------+-----------+-------+ Radial        Full                                          +----------+------------+----------+---------+-----------+-------+ Ulnar         Full                                          +----------+------------+----------+---------+-----------+-------+ Cephalic    Partial                                  Acute  +----------+------------+----------+---------+-----------+-------+ Basilic       Full                                          +----------+------------+----------+---------+-----------+-------+  Cephalic superficial thrombus surrounding the patient's IV.  Left Findings: +----------+------------+----------+---------+-----------+-------+ LEFT      CompressiblePropertiesPhasicitySpontaneousSummary +----------+------------+----------+---------+-----------+-------+ IJV           Full                 Yes       Yes            +----------+------------+----------+---------+-----------+-------+ Subclavian    Full                 Yes       Yes            +----------+------------+----------+---------+-----------+-------+ Axillary      Full                 Yes       Yes            +----------+------------+----------+---------+-----------+-------+ Brachial      Full                 Yes       Yes             +----------+------------+----------+---------+-----------+-------+ Radial        Full                                          +----------+------------+----------+---------+-----------+-------+ Ulnar         Full                                          +----------+------------+----------+---------+-----------+-------+ Cephalic      Full                                          +----------+------------+----------+---------+-----------+-------+ Basilic       Full                                          +----------+------------+----------+---------+-----------+-------+  Summary:  Right: No evidence of deep vein thrombosis in the upper extremity. Findings consistent with acute superficial vein thrombosis involving the right cephalic vein.  Left: No evidence of deep vein thrombosis in the upper extremity. No evidence of superficial vein thrombosis in the upper extremity.  *See table(s) above for measurements and observations.  Diagnosing physician: Servando Snare MD Electronically signed by Servando Snare MD on 08/22/2018 at 3:48:27 PM.    Final     Microbiology: No results found for this or any previous visit (from the past 240 hour(s)).   Labs: Basic Metabolic Panel: Recent Labs  Lab 08/22/18 1234 08/23/18 0325 08/24/18 0306 08/25/18 0516 08/26/18 0449  NA 139 140 136 138 138  K 3.7 3.6 4.1 5.2* 4.6  CL 107 104 104 105 104  CO2 25 27 21* 28 28  GLUCOSE 124* 178* 159* 161* 117*  BUN 19 19 16 20 20   CREATININE 0.77 0.81 0.67 0.68 0.61  CALCIUM 8.7* 8.5* 8.2* 8.5* 8.5*  MG  --  2.4 2.3 2.6* 2.4   Liver Function Tests: Recent Labs  Lab 08/22/18 1234 08/23/18 0325 08/24/18 0306 08/25/18 0516 08/26/18 0449  AST 39  40 32 21 19  ALT 52* 52* 47* 33 27  ALKPHOS 67 64 66 63 58  BILITOT 1.0 0.8 0.7 0.6 0.9  PROT 6.3* 6.1* 5.9* 5.8* 5.6*  ALBUMIN 3.6 3.3* 3.1* 3.0* 3.0*   No results for input(s): LIPASE, AMYLASE in the last 168 hours. No results for input(s): AMMONIA  in the last 168 hours. CBC: Recent Labs  Lab 08/22/18 1234 08/23/18 0325 08/24/18 0306 08/25/18 0516 08/26/18 0449  WBC 10.9* 9.9 8.7 12.5* 9.8  NEUTROABS  --  7.3  --   --   --   HGB 13.1 13.0 12.8* 12.1* 12.2*  HCT 40.7 40.6 40.0 37.8* 37.3*  MCV 95.1 96.4 97.3 98.2 95.2  PLT 309 278 262 266 288   Cardiac Enzymes: Recent Labs  Lab 08/21/18 1254  TROPONINI <0.03   BNP: BNP (last 3 results) Recent Labs    08/21/18 1324  BNP 26.0    ProBNP (last 3 results) No results for input(s): PROBNP in the last 8760 hours.  CBG: Recent Labs  Lab 08/25/18 1144 08/25/18 1735 08/25/18 2148 08/26/18 0802 08/26/18 1219  GLUCAP 137* 169* 131* 185* 124*       Signed:  Fayrene Helper MD.  [facility] 08/26/2018, 12:22 PM

## 2018-08-26 NOTE — Progress Notes (Signed)
IP PROGRESS NOTE  Subjective:   [name] feels well.  He tolerated chemotherapy without any complications.  He denies any nausea, vomiting or shortness of breath.  Denies any infusion related complications.  He does not report any abdominal distention or early satiety.  His bowel habits are about the same.  He denies any neurological deficits including headaches or blurry vision.  He denies any syncope or seizures.  He has been ambulating without any issues and appetite has been excellent.  Remaining review of systems unremarkable.    Objective:  Vital signs in last 24 hours: Temp:  [97.6 F (36.4 C)-97.7 F (36.5 C)] 97.7 F (36.5 C) (01/20 0603) Pulse Rate:  [63-74] 74 (01/20 0603) Resp:  [18-20] 20 (01/20 0603) BP: (116-131)/(67-81) 116/67 (01/20 0603) SpO2:  [91 %-99 %] 95 % (01/20 0603) Weight change:  Last BM Date: 08/25/18  Intake/Output from previous day: 01/19 0701 - 01/20 0700 In: 1857.4 [P.O.:720; IV Piggyback:1137.4] Out: -    General appearance: Alert, awake without any distress. Head: Atraumatic without abnormalities Oropharynx: Without any thrush or ulcers. Eyes: No scleral icterus. Lymph nodes: No lymphadenopathy noted in the cervical, supraclavicular, or axillary nodes Heart:regular rate and rhythm, without any murmurs or gallops.   Lung: Clear to auscultation without any rhonchi, wheezes or dullness to percussion. Abdomin: Soft, nontender without any shifting dullness or ascites. Musculoskeletal: No clubbing or cyanosis. Neurological: No motor or sensory deficits. Skin: No rashes or lesions. Psychiatric: Mood and affect appeared normal.    Lab Results: Recent Labs    08/25/18 0516 08/26/18 0449  WBC 12.5* 9.8  HGB 12.1* 12.2*  HCT 37.8* 37.3*  PLT 266 288    BMET Recent Labs    08/25/18 0516 08/26/18 0449  NA 138 138  K 5.2* 4.6  CL 105 104  CO2 28 28  GLUCOSE 161* 117*  BUN 20 20  CREATININE 0.68 0.61  CALCIUM 8.5* 8.5*       Medications: I have reviewed the patient's current medications.  Assessment/Plan:  73 year old man with the following:  1.    Extensive stage small cell lung cancer diagnosed in January 2020 with bilateral axillary adenopathy and possibly hepatic metastasis.  He completed cycle 1 of chemotherapy without complications.  Cycle 2 will be given in 3 weeks as an outpatient.  We will arrange a follow-up at any point cancer center upon his discharge.   2.  IV access: Peripheral veins used at this time and might require a Port-A-Cath prior to his second cycle.  3.  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  4.  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  Normal electrolytes.   5.  Nausea: No issues reported Zofran is available to him as needed.  6.  Growth factor support: Granix to start today for 7 days.  7.  Brain metastasis: Will likely require radiation in the future.  He is asymptomatic and very minimal disease.  8.    Disposition: I have no objection to discharge if ready by the primary team.  We will arrange follow-up for him at [facility].     25  minutes was spent with the patient face-to-face today.  More than 50% of time was dedicated to discussing laboratory data, disease status and coordinating plan of care.     LOS: 3 days   Zola Button 08/26/2018, 8:32 AM

## 2018-08-26 NOTE — Progress Notes (Signed)
Oncology navigator note: I called patient on mobile phone and introduced myself. I talked with him about my role in his care here at [facility].  I talked with him about upcoming appointment.  I briefly discussed with him the need for port a cath and he said he was already aware.  I told him ideally we would like for that to be placed before next cycle.  I have him scheduled to see [place] in one week as he is anticipating discharge today. Patient is appreciative of my call and will call us should he have any issues with next week's appointment.

## 2018-08-27 ENCOUNTER — Encounter (HOSPITAL_COMMUNITY): Payer: Self-pay

## 2018-08-27 ENCOUNTER — Other Ambulatory Visit: Payer: Self-pay

## 2018-08-27 ENCOUNTER — Inpatient Hospital Stay (HOSPITAL_COMMUNITY): Payer: Medicare Other | Attending: Hematology

## 2018-08-27 ENCOUNTER — Ambulatory Visit
Admission: RE | Admit: 2018-08-27 | Discharge: 2018-08-27 | Disposition: A | Discharge status: Home / Self Care | Payer: Medicare Other | Source: Ambulatory Visit | Attending: Radiation Oncology | Admitting: Radiation Oncology

## 2018-08-27 VITALS — BP 110/60 | HR 93 | Temp 97.7°F | Resp 18

## 2018-08-27 DIAGNOSIS — C771 Secondary and unspecified malignant neoplasm of intrathoracic lymph nodes: Secondary | ICD-10-CM | POA: Diagnosis not present

## 2018-08-27 DIAGNOSIS — C7931 Secondary malignant neoplasm of brain: Secondary | ICD-10-CM | POA: Insufficient documentation

## 2018-08-27 DIAGNOSIS — C3491 Malignant neoplasm of unspecified part of right bronchus or lung: Secondary | ICD-10-CM | POA: Diagnosis not present

## 2018-08-27 DIAGNOSIS — Z510 Encounter for antineoplastic radiation therapy: Secondary | ICD-10-CM | POA: Diagnosis not present

## 2018-08-27 DIAGNOSIS — Z5189 Encounter for other specified aftercare: Secondary | ICD-10-CM | POA: Diagnosis not present

## 2018-08-27 MED ORDER — PEGFILGRASTIM-CBQV 6 MG/0.6ML ~~LOC~~ SOSY
6.0000 mg | PREFILLED_SYRINGE | Freq: Once | SUBCUTANEOUS | Status: AC
  Administered 2018-08-27: 6 mg via SUBCUTANEOUS

## 2018-08-27 MED ORDER — PEGFILGRASTIM-CBQV 6 MG/0.6ML ~~LOC~~ SOSY
PREFILLED_SYRINGE | SUBCUTANEOUS | Status: AC
  Filled 2018-08-27: qty 0.6

## 2018-08-27 NOTE — Patient Instructions (Signed)
[place] at [facility]  Discharge Instructions:  Ellen Henri today Follow up as scheduled _______________________________________________________________  Thank you for choosing [place] at [facility] to provide your oncology and hematology care.  To afford each patient quality time with our providers, please arrive at least 15 minutes before your scheduled appointment.  You need to re-schedule your appointment if you arrive 10 or more minutes late.  We strive to give you quality time with our providers, and arriving late affects you and other patients whose appointments are after yours.  Also, if you no show three or more times for appointments you may be dismissed from the clinic.  Again, thank you for choosing [place] at [place] hope is that these requests will allow you access to exceptional care and in a timely manner. _______________________________________________________________  If you have questions after your visit, please contact our office at [phone] between the hours of 8:30 a.m. and 5:00 p.m. Voicemails left after 4:30 p.m. will not be returned until the following business day. _______________________________________________________________  For prescription refill requests, have your pharmacy contact our office. _______________________________________________________________  Recommendations made by the consultant and any test results will be sent to your referring physician. _______________________________________________________________

## 2018-08-27 NOTE — Progress Notes (Signed)
[name] presents today for injection per MD orders.  UDenyca 6 mg  administered SQ in left Upper Arm. Administration without incident. Patient tolerated well.  Vitals stable and discharged home from clinic ambulatory. Follow up as scheduled.

## 2018-08-28 ENCOUNTER — Ambulatory Visit
Admission: RE | Admit: 2018-08-28 | Discharge: 2018-08-28 | Disposition: A | Discharge status: Home / Self Care | Payer: Medicare Other | Source: Ambulatory Visit | Attending: Radiation Oncology | Admitting: Radiation Oncology

## 2018-08-28 DIAGNOSIS — Z510 Encounter for antineoplastic radiation therapy: Secondary | ICD-10-CM | POA: Diagnosis not present

## 2018-08-29 ENCOUNTER — Ambulatory Visit
Admission: RE | Admit: 2018-08-29 | Discharge: 2018-08-29 | Disposition: A | Discharge status: Home / Self Care | Payer: Medicare Other | Source: Ambulatory Visit | Attending: Radiation Oncology | Admitting: Radiation Oncology

## 2018-08-29 DIAGNOSIS — C7931 Secondary malignant neoplasm of brain: Secondary | ICD-10-CM | POA: Insufficient documentation

## 2018-08-29 DIAGNOSIS — Z510 Encounter for antineoplastic radiation therapy: Secondary | ICD-10-CM | POA: Diagnosis not present

## 2018-08-29 NOTE — Progress Notes (Signed)
  Radiation Oncology         [phone] ________________________________  Name: ROSTON GRUNEWALD MRN: [id number]  Date: 08/29/2018  DOB: 1945-11-06    Simulation and treatment planning note  DIAGNOSIS:     ICD-10-CM   1. Brain metastasis ([place]) C79.31      The patient presented for simulation for the patient's upcoming course of whole brain radiation treatment. The patient was placed in a supine position and a customized thermoplastic head cast was constructed to aid in patient immobilization during the treatment. This complex treatment device will be used on a daily basis. In this fashion a CT scan was obtained through the head and neck region and isocenter was placed near midline within the brain.  The patient will be planned to receive a course of whole brain radiation treatment to a dose of 30 gray in 10 fractions at 3 gray per fraction. To accomplish this, 2 customized blocks have been designed which corresponds to left and right whole brain radiation fields. These 2 complex treatment devices will be used on a daily basis during the course of radiation. A complex isodose plan is requested to insure that the target area is adequately covered in to facilitate optimization of the treatment plan. A forward planning technique will also be evaluated to determine if this approach significantly improves the plan.   ________________________________   Jodelle Gross, MD, PhD

## 2018-08-30 ENCOUNTER — Ambulatory Visit
Admission: RE | Admit: 2018-08-30 | Discharge: 2018-08-30 | Disposition: A | Discharge status: Home / Self Care | Payer: Medicare Other | Source: Ambulatory Visit | Attending: Radiation Oncology | Admitting: Radiation Oncology

## 2018-08-30 DIAGNOSIS — Z510 Encounter for antineoplastic radiation therapy: Secondary | ICD-10-CM | POA: Diagnosis not present

## 2018-09-02 ENCOUNTER — Ambulatory Visit
Admission: RE | Admit: 2018-09-02 | Discharge: 2018-09-02 | Disposition: A | Discharge status: Home / Self Care | Payer: Medicare Other | Source: Ambulatory Visit | Attending: Radiation Oncology | Admitting: Radiation Oncology

## 2018-09-02 ENCOUNTER — Other Ambulatory Visit: Payer: Self-pay

## 2018-09-02 ENCOUNTER — Inpatient Hospital Stay (HOSPITAL_BASED_OUTPATIENT_CLINIC_OR_DEPARTMENT_OTHER): Payer: Medicare Other | Admitting: Hematology

## 2018-09-02 ENCOUNTER — Encounter (HOSPITAL_COMMUNITY): Payer: Self-pay | Admitting: Hematology

## 2018-09-02 VITALS — BP 110/71 | HR 83 | Temp 97.9°F | Resp 16 | Wt 205.0 lb

## 2018-09-02 DIAGNOSIS — C7931 Secondary malignant neoplasm of brain: Secondary | ICD-10-CM | POA: Diagnosis not present

## 2018-09-02 DIAGNOSIS — C3491 Malignant neoplasm of unspecified part of right bronchus or lung: Secondary | ICD-10-CM

## 2018-09-02 DIAGNOSIS — Z7189 Other specified counseling: Secondary | ICD-10-CM | POA: Insufficient documentation

## 2018-09-02 DIAGNOSIS — Z510 Encounter for antineoplastic radiation therapy: Secondary | ICD-10-CM | POA: Diagnosis not present

## 2018-09-02 NOTE — Patient Instructions (Signed)
[facility] at [facility] Discharge Instructions     Thank you for choosing [facility] at [facility] to provide your oncology and hematology care.  To afford each patient quality time with our provider, please arrive at least 15 minutes before your scheduled appointment time.   If you have a lab appointment with the Cancer Center please come in thru the  Main Entrance and check in at the main information desk  You need to re-schedule your appointment should you arrive 10 or more minutes late.  We strive to give you quality time with our providers, and arriving late affects you and other patients whose appointments are after yours.  Also, if you no show three or more times for appointments you may be dismissed from the clinic at the providers discretion.     Again, thank you for choosing [facility].  Our hope is that these requests will decrease the amount of time that you wait before being seen by our physicians.       _____________________________________________________________  Should you have questions after your visit to [facility], please contact our office at [phone] between the hours of 8:00 a.m. and 4:30 p.m.  Voicemails left after 4:00 p.m. will not be returned until the following business day.  For prescription refill requests, have your pharmacy contact our office and allow 72 hours.    Cancer Center Support Programs:   > Cancer Support Group  2nd Tuesday of the month 1pm-2pm, Journey Room    

## 2018-09-02 NOTE — Assessment & Plan Note (Signed)
1.  Extensive stage small cell lung cancer: -Patient noted bilateral axillary adenopathy starting in October 2019. - He was evaluated at [facility] with a biopsy of the axillary lymph node.  He was also diagnosed with small pulmonary embolism and was started on Eliquis. - On 08/21/2018, he presented to the ER with shortness of breath.  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 -Hence he was admitted to the hospital and was treated with first cycle of chemotherapy with carboplatin and VP-16 on 08/23/2018 along with radiation. -He is currently undergoing radiation therapy. - MRI of the brain on 08/22/2018 shows 3 subcentimeter lesions within the brain, present in the right insula, right occipital lobe and right cerebellar hemisphere. -He did not experience any major side effects from his first cycle of chemotherapy.  He has mild tiredness. - I have talked to him about the normal prognosis of extensive stage small cell lung cancer and the treatment intent in palliative setting. - We have also talked about first-line therapy with chemo immunotherapy.  I plan to add durvalumab at 1500 mg on day 1 along with chemotherapy.  After 4 cycles of chemotherapy, durvalumab will be continued as maintenance.  We talked about the side effects of immunotherapy including but not limited to colitis, pneumonitis, hypophysitis, dermatitis among others. -We will see him back in 10 days to initiate cycle 2.  We will make a referral to Drs. Jenkins/Bridges for port placement.  2.  Brain metastasis: - MRI of the brain on 08/22/2018 showed 3 small subcentimeter metastasis in the right insula, right occipital lobe and right cerebellar hemisphere. - WBRT added on 09/02/2018.

## 2018-09-02 NOTE — Progress Notes (Signed)
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 male is here because of a recent diagnosis of extensive stage small cell carcinoma with brain metastasis. He was first seen by his primary care doctor for right axillary lymphadenopathy. He was referred for biopsy. This is when they found his lung cancer. He started having nausea, headaches, and frequent falls around the same time due to the metastasis to the brain. He had radiation and started chemotherapy in [place] at this time. He is wanting to finish his chemo here at [facility] due to it being closer to his home. He reports since his first chemo and radiation he had felt better and not having but a few headaches and no falls since. He still has nausea from his last treatment. He denies any weight loss in the past 4 months. Denies any increase in fatigue. He is not getting very much activity at this time. His appetite is still good. Denies any nausea, vomiting, or diarrhea. Denies any new pains. Had not noticed any recent bleeding such as epistaxis, hematuria or hematochezia. Denies recent chest pain on exertion, shortness of breath on minimal exertion, pre-syncopal episodes, or palpitations. Denies any numbness or tingling in hands or feet. Denies any recent fevers, infections, or recent hospitalizations. Patient reports appetite at 100% and energy level at 50%. He denies any autoimmune diseases.  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 He is scheduled to have one removed on his face on 10/03/2018.  He denies any known exposure to [employer] during [place] and [place] tours with the [employer]. 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 He now lives with his brother and is very independent and full functioning. He is able to perform all his own ALDs and activity. He is able to drive and handle his own finances.  His father had lung and liver cancer. His mother had lung cancer. His paternal grandfather had bone cancer.    MEDICAL HISTORY:  Past Medical History:  Diagnosis Date  . Actinic keratosis   . Arthritis   . Colitis MAY 2012    CT ABD/PELVIS HEP FLEXURE  . COPD (chronic obstructive pulmonary disease) ([place])   . Diabetes mellitus without complication ([place])   . History of kidney stones   . Hyperlipemia   . Hypertension   . NSAID long-term use NAPROXEN FOR OA  . SCL Ca dx'd 08/2018    SURGICAL HISTORY: Past Surgical History:  Procedure Laterality Date  . BACK SURGERY     spinal stenosis  . BASAL CELL CARCINOMA EXCISION  FACE, arms feet, leg  . CHOLECYSTECTOMY  JUNE 2011 MJ   STONES, PANCREATITIS  . KNEE ARTHROSCOPY WITH MEDIAL MENISECTOMY Right 03/24/2015   Procedure: KNEE ARTHROSCOPY WITH MEDIAL MENISECTOMY;  Surgeon: Carole Civil, MD;  Location: [facility];  Service: Orthopedics;  Laterality: Right;  . KNEE SURGERY Left LEFT   arthroscopy  . LITHOTRIPSY  80s  . SPINE SURGERY      SOCIAL HISTORY: Social History   Socioeconomic History  . Marital status: Divorced    Spouse name: Not on file  . Number of children: 0  .  Years of education: Not on file  . Highest education level: Not on file  Occupational History  . Occupation: [place] [employer]   . Occupation: Manufacturing engineer   Social Needs  . Financial resource strain: Not hard at all  . Food insecurity:    Worry: Never true    Inability: Never true  . Transportation needs:    Medical: No    Non-medical: No  Tobacco Use  . Smoking status: Former Smoker    Packs/day: 1.00    Years: 50.00    Pack years: 50.00    Types: Cigarettes    Last attempt to quit: 08/07/2018    Years since quitting: 0.0   . Smokeless tobacco: Never Used  Substance and Sexual Activity  . Alcohol use: Yes    Comment: once a month  . Drug use: No  . Sexual activity: Never    Birth control/protection: None  Lifestyle  . Physical activity:    Days per week: 0 days    Minutes per session: 0 min  . Stress: Not at all  Relationships  . Social connections:    Talks on phone: Never    Gets together: More than three times a week    Attends religious service: Never    Active member of club or organization: Yes    Attends meetings of clubs or organizations: More than 4 times per year    Relationship status: Divorced  . Intimate partner violence:    Fear of current or ex partner: No    Emotionally abused: No    Physically abused: No    Forced sexual activity: No  Other Topics Concern  . 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    FAMILY HISTORY: Family History  Problem Relation Age of Onset  . Cancer Mother        lung  . Cancer Father        lung and liver  . Colon polyps Neg Hx   . Colon cancer Neg Hx     ALLERGIES:  has No Known Allergies.  MEDICATIONS:  Current Outpatient Medications  Medication Sig Dispense Refill  . albuterol (PROVENTIL HFA;VENTOLIN HFA) 108 (90 Base) MCG/ACT inhaler INHALE 2 PUFFS INTO THE LUNGS QID    . aspirin 81 MG tablet Take 81 mg by mouth daily.      . Cholecalciferol 50 MCG (2000 UT) TBDP Take 1 tablet by mouth daily.    . metFORMIN (GLUCOPHAGE) 500 MG tablet Take 500 mg by mouth 2 (two) times daily with a meal.    . Multiple Vitamin (MULTIVITAMIN) tablet Take 1 tablet by mouth daily.    . rosuvastatin (CRESTOR) 10 MG tablet Take 10 mg by mouth daily.      Marland Kitchen telmisartan-hydrochlorothiazide (MICARDIS HCT) 40-12.5 MG per tablet Take 1 tablet by mouth daily.      . verapamil (COVERA HS) 240 MG (CO) 24 hr tablet Take 240 mg by mouth at bedtime.       No current  facility-administered medications for this visit.     REVIEW OF SYSTEMS:   Constitutional: Denies fevers, chills or abnormal night sweats Eyes: Denies blurriness of vision, double vision or watery eyes Ears, nose, mouth, throat, and face: Denies mucositis or sore throat Respiratory: Denies cough, dyspnea or wheezes Cardiovascular: Denies palpitation, chest discomfort or lower extremity swelling Gastrointestinal:  +nausea, Denies  any heartburn or change in bowel habits Skin: Denies abnormal skin rashes Lymphatics: + Axillary lymphadenopathy  Neurological:Denies numbness, tingling or new weaknesses Behavioral/Psych: Mood is stable, no new changes  All other systems were reviewed with the patient and are negative.  PHYSICAL EXAMINATION: ECOG PERFORMANCE STATUS: 1 - Symptomatic but completely ambulatory  Vitals:   09/02/18 1408  BP: 110/71  Pulse: 83  Resp: 16  Temp: 97.9 F (36.6 C)  SpO2: 97%   Filed Weights   09/02/18 1408  Weight: 205 lb (93 kg)    GENERAL:alert, no distress and comfortable SKIN: skin color, texture, turgor are normal, no rashes or significant lesions EYES: normal, conjunctiva are pink and non-injected, sclera clear OROPHARYNX:no exudate, no erythema and lips, buccal mucosa, and tongue normal  NECK: supple, thyroid normal size, non-tender, without nodularity LYMPH:  + axillary and cervical lymphadenopathy  LUNGS: clear to auscultation and percussion with normal breathing effort HEART: regular rate & rhythm and no murmurs and no lower extremity edema ABDOMEN:abdomen soft, non-tender and normal bowel sounds Musculoskeletal:no cyanosis of digits and no clubbing  PSYCH: alert & oriented x 3 with fluent speech NEURO: no focal motor/sensory deficits  LABORATORY DATA:  I have reviewed the data as listed Lab Results  Component Value Date   WBC 9.8 08/26/2018   HGB 12.2 (L) 08/26/2018   HCT 37.3 (L) 08/26/2018   MCV 95.2 08/26/2018   PLT 288 08/26/2018      Chemistry      Component Value Date/Time   NA 138 08/26/2018 0449   K 4.6 08/26/2018 0449   CL 104 08/26/2018 0449   CO2 28 08/26/2018 0449   BUN 20 08/26/2018 0449   CREATININE 0.61 08/26/2018 0449      Component Value Date/Time   CALCIUM 8.5 (L) 08/26/2018 0449   ALKPHOS 58 08/26/2018 0449   AST 19 08/26/2018 0449   ALT 27 08/26/2018 0449   BILITOT 0.9 08/26/2018 0449       RADIOGRAPHIC STUDIES: I have personally reviewed the radiological images as listed and agreed with the findings in the report. Dg Chest 2 View  Result Date: 08/21/2018 CLINICAL DATA:  Chest pain, sob, nausea, and headache intermittently for over a week. Was seen and d/c from [facility] last week with same s/s. Hx COPD, HTN, DM, pulm embolism. Smoker. EXAM: CHEST - 2 VIEW COMPARISON:  07/25/2018 and older exams. FINDINGS: Cardiac silhouette is top-normal in size. There is a smooth concave soft tissue bulge from the right superior mediastinum, most likely vascular, but more prominent than on prior exams. There is no deviation or mass effect on the trachea. Mediastinum otherwise normal in contour. Main pulmonary arteries appear enlarged. There are thickened bronchovascular markings bilaterally stable from the prior exam. No evidence of pneumonia or pulmonary edema. No pleural effusion or pneumothorax. Skeletal structures are intact. IMPRESSION: 1. No acute cardiopulmonary disease. 2. Chronically thickened bronchovascular markings and enlarged pulmonary arteries, the latter suggesting pulmonary hypertension. 3. Soft tissue contour bulge along the right superior mediastinum that is most likely vascular. This could be further assessed with chest CT. Electronically Signed   By: Lajean Manes M.D.   On: 08/21/2018 12:56   Ct Head Wo Contrast  Result Date: 08/21/2018 CLINICAL DATA:  Chest pain, sob and headache intermittently for over a week. Was seen and d/c from [facility] last week with same s/s. Has  not seen pcp, has an appointment on Friday. EXAM: CT HEAD WITHOUT CONTRAST TECHNIQUE: Contiguous axial images were  obtained from the base of the skull through the vertex without intravenous contrast. COMPARISON:  None. FINDINGS: Brain: No evidence of acute infarction, hemorrhage, hydrocephalus, extra-axial collection or mass lesion/mass effect. There are mild areas of white matter hypoattenuation consistent with chronic microvascular ischemic change. Vascular: No hyperdense vessel or unexpected calcification. Skull: Normal. Negative for fracture or focal lesion. Sinuses/Orbits: Globes and orbits are unremarkable. The sinuses and mastoid air cells are clear. Other: None. IMPRESSION: 1. No acute intracranial abnormalities. 2. Mild chronic microvascular ischemic change. Electronically Signed   By: Lajean Manes M.D.   On: 08/21/2018 14:43   Ct Angio Chest Pe W And/or Wo Contrast  Result Date: 08/21/2018 CLINICAL DATA:  One-week history of intermittent chest pain, shortness of breath and headaches. EXAM: CT ANGIOGRAPHY CHEST WITH CONTRAST TECHNIQUE: Multidetector CT imaging of the chest was performed using the standard protocol during bolus administration of intravenous contrast. Multiplanar CT image reconstructions and MIPs were obtained to evaluate the vascular anatomy. CONTRAST:  159mL ISOVUE-370 IOPAMIDOL INJECTION 76% IV. COMPARISON:  CT chest 01/06/2010. FINDINGS: Cardiovascular: Contrast opacification of the pulmonary arterial system and the systemic arterial system good and equivalent. No filling defects within either main pulmonary artery or their segmental branches in either lung to suggest pulmonary embolism. Heart moderately enlarged. Dense mitral valvular calcifications. Moderate to severe three-vessel coronary atherosclerosis. No pericardial effusion. Moderate atherosclerosis involving the thoracic and proximal abdominal aorta. Ascending thoracic aortic aneurysm measuring up to approximately 4.6 cm.  Atherosclerosis at the origin of the great vessels without evidence of stenosis. Direct origin of the LEFT vertebral artery from the aortic arch. Severe narrowing of the proximal and mid SVC by massive lymphadenopathy which will be detailed below. Numerous collaterals are present in the RIGHT UPPER chest and back (contrast injection was performed in the RIGHT UPPER extremity). Mediastinum/Nodes: Massive mediastinal, BILATERAL hilar and BILATERAL axillary lymphadenopathy. Enlarged lymph nodes are present at all 3 levels of both the RIGHT and LEFT axilla. BILATERAL supraclavicular lymphadenopathy is also present. Index conglomerate RIGHT UPPER paratracheal lymph nodes (station 2R) measure approximately 8.8 x 4.7 cm (series 5, image 23). Conglomerate RIGHT axillary lymph nodes measure approximately 4.9 x 4.3 cm (image 48). Conglomerate low LEFT axillary lymph nodes measure approximately 6.4 x 3.4 cm (image 53). Conglomerate subcarinal mediastinal (station 7 lymphadenopathy measures approximately 3.4 x 7.9 cm (image 54). Conglomerate RIGHT axillary lymph nodes measure approximately 5.1 x 6.1 cm (image 14).Index conglomerate LEFT axillary nodal mass measures approximately 4.1 x 7.8 cm on coronal image 71. Pericardial/duplication cyst adjacent to the RIGHT atrium in the LOWER mediastinum measures approximately 4.6 x 2.9 cm, slightly increased in size since the prior CT. Ormal appearing esophagus which I suspect is compressed by the massive mediastinal lymphadenopathy. Normal-appearing thyroid gland. Lungs/Pleura: Mild emphysematous changes throughout both lungs. Tree in bud nodular opacities in the RIGHT MIDDLE LOBE. Lungs otherwise clear. No confluent airspace consolidation. No evidence of interstitial lung disease. Subpleural lipoma involving the LEFT LATERAL chest wall. No pleural effusions. Central airways patent with moderate to severe bronchial wall thickening. The LEFT UPPER LOBE and LEFT LOWER LOBE bronchi are  compressed by the bulky lymphadenopathy. The RIGHT lobar bronchi do not appear significantly compressed. Upper Abdomen: Approximate 1.5 x 1.6 cm mass in the fat adjacent to the RIGHT lobe of the liver (image 95). Normal sized celiac axis and gastrohepatic ligament lymph nodes; no pathologic lymphadenopathy in the upper abdomen. No retrocrural lymphadenopathy. At least 3 accessory splenules in the [place]. Mild pancreatic  atrophy. Surgically absent gallbladder. Musculoskeletal: Mild-to-moderate diffuse thoracic spine degenerative disc disease and spondylosis. Degenerative disc disease and spondylosis involving the visualized LOWER cervical spine. No acute findings. No evidence of osseous metastatic disease. Review of the MIP images confirms the above findings. IMPRESSION: 1. Bulky mediastinal, BILATERAL hilar and BILATERAL axillary lymphadenopathy, with index conglomerate nodal masses measured above. Lymphoma is suspected, possibly Hodgkin's lymphoma. 2. No evidence of pulmonary embolism. 3. 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 4. Approximate 1.6 cm mass involving the fat adjacent to the RIGHT lobe of the liver. No evidence of pathologic lymphadenopathy in the visualized upper abdomen. 5. Tree in bud opacities in the RIGHT MIDDLE LOBE, likely an acute inflammatory process. Severe central bronchial wall thickening indicates asthma and/or bronchitis. No acute cardiopulmonary disease otherwise. 6. Benign pericardial/duplication cyst adjacent to the RIGHT atrium. 7. Ascending thoracic aortic aneurysm measuring approximately 4.6 cm diameter. Recommend semi-annual imaging followup by CTA or MRA and referral to cardiothoracic surgery if not already obtained. This recommendation follows 2010 ACCF/AHA/AATS/ACR/ASA/SCA/SCAI/SIR/STS/SVM Guidelines for the Diagnosis and Management of Patients With Thoracic Aortic Disease. Circulation. 2010; 121: G387-F643.  8. Moderate cardiomegaly. Dense aortic valvular calcifications. Moderate three-vessel coronary atherosclerosis. Aortic Atherosclerosis (ICD10-I70.0) and Emphysema (ICD10-J43.9). Aortic aneurysm NOS (ICD10-I71.9). Electronically Signed   By: Evangeline Dakin M.D.   On: 08/21/2018 15:09   Mr Jeri Cos PI Contrast  Result Date: 08/22/2018 CLINICAL DATA:  73 y/o  M; small cell lung cancer staging. EXAM: MRI HEAD WITHOUT AND WITH CONTRAST TECHNIQUE: Multiplanar, multiecho pulse sequences of the brain and surrounding structures were obtained without and with intravenous contrast. CONTRAST:  9 cc Gadavist COMPARISON:  08/21/2018 CT head. FINDINGS: Brain: 3 subcentimeter lesions within the brain demonstrating diffusion hyperintensity in the right external capsule, right occipital lobe, and right cerebellar hemisphere (series 3 image 14, 20, 25). The lesions within the right cerebellar hemisphere and the right external capsule demonstrate enhancement best appreciated on the sagittal sequence (series 13, image 7 and 9). The right occipital lesion does not demonstrate appreciable enhancement, however, is clearly visible on the T1 noncontrast sequences (series 9 image 20). Mild edema is associated with the right cerebellar lesion. No additional lesion in the brain is identified. Background of punctate nonspecific T2 FLAIR hyperintensities in subcortical and periventricular white matter are compatible with mild chronic microvascular ischemic changes for age. Mild volume loss of the brain. No stroke, hemorrhage, extra-axial collection, hydrocephalus, or significant mass effect. Vascular: Normal flow voids. Skull and upper cervical spine: Normal marrow signal. Sinuses/Orbits: Negative. Other: None. IMPRESSION: 1. 3 small metastasis are present in the right insula, right occipital lobe, and right cerebellar hemisphere. 2. No hemorrhage, mass effect, or stroke identified. 3. Mild for age chronic microvascular ischemic changes and  volume loss of the brain. Electronically Signed   By: Kristine Garbe M.D.   On: 08/22/2018 14:42   Ct Abdomen Pelvis W Contrast  Result Date: 08/23/2018 CLINICAL DATA:  History of small cell lung cancer. EXAM: CT ABDOMEN AND PELVIS WITH CONTRAST TECHNIQUE: Multidetector CT imaging of the abdomen and pelvis was performed using the standard protocol following bolus administration of intravenous contrast. CONTRAST:  127mL OMNIPAQUE IOHEXOL 300 MG/ML  SOLN COMPARISON:  CT scan of Dec 22, 2010.  CT scan of August 21, 2018. FINDINGS: Lower chest: Minimal bibasilar subsegmental atelectasis is noted. 3.9 cm pericardial cyst is noted. Hepatobiliary: No focal liver abnormality is seen. Status post cholecystectomy. No biliary dilatation. Pancreas:  Unremarkable. No pancreatic ductal dilatation or surrounding inflammatory changes. Spleen: Normal in size without focal abnormality. Adrenals/Urinary Tract: 14 mm left adrenal nodule is noted which is slightly enlarged compared to prior exam, most consistent with benign adenoma. Right adrenal gland appears normal. No hydronephrosis or renal obstruction is noted small exophytic cyst is seen arising posteriorly from left kidney. No hydronephrosis or renal obstruction is noted. Mild urinary bladder distention is noted. Stomach/Bowel: Stomach is within normal limits. Appendix appears normal. No evidence of bowel wall thickening, distention, or inflammatory changes. Vascular/Lymphatic: Aortic atherosclerosis. No enlarged abdominal or pelvic lymph nodes. Reproductive: Mild prostatic enlargement is noted. Other: No hernia or ascites is noted. 2.1 cm mildly lobulated soft tissue density is seen superior to the right kidney which was not present on prior exam. It demonstrates average Hounsfield measurement of 22. Musculoskeletal: No acute or significant osseous findings. IMPRESSION: 2.1 cm abnormal soft tissue density is seen posterior to the liver as described on prior CT scan  of August 21, 2018, but not visualized on prior exam of Dec 22, 2010. Possible malignancy or metastatic disease can not be excluded. 14 mm left adrenal nodule is noted which was present on prior exam of 2012 and most consistent with benign adenoma. Mild prostatic enlargement. Aortic Atherosclerosis (ICD10-I70.0). Electronically Signed   By: Marijo Conception, M.D.   On: 08/23/2018 11:51   Vas [place] Lower Extremity Venous (dvt)  Result Date: 08/22/2018  Lower Venous Study Indications: History of PE.  Performing Technologist: Oliver Hum RVT  Examination Guidelines: A complete evaluation includes B-mode imaging, spectral Doppler, color Doppler, and power Doppler as needed of all accessible portions of each vessel. Bilateral testing is considered an integral part of a complete examination. Limited examinations for reoccurring indications may be performed as noted.  Right Venous Findings: +---------+---------------+---------+-----------+----------+-------+          CompressibilityPhasicitySpontaneityPropertiesSummary +---------+---------------+---------+-----------+----------+-------+ CFV      Full           Yes      Yes                          +---------+---------------+---------+-----------+----------+-------+ SFJ      Full                                                 +---------+---------------+---------+-----------+----------+-------+ FV Prox  Full                                                 +---------+---------------+---------+-----------+----------+-------+ FV Mid   Full                                                 +---------+---------------+---------+-----------+----------+-------+ FV DistalFull                                                 +---------+---------------+---------+-----------+----------+-------+ PFV      Full                                                 +---------+---------------+---------+-----------+----------+-------+  POP       Full           Yes      Yes                          +---------+---------------+---------+-----------+----------+-------+ PTV      Full                                                 +---------+---------------+---------+-----------+----------+-------+ PERO     Full                                                 +---------+---------------+---------+-----------+----------+-------+  Left Venous Findings: +---------+---------------+---------+-----------+----------+-------+          CompressibilityPhasicitySpontaneityPropertiesSummary +---------+---------------+---------+-----------+----------+-------+ CFV      Full           Yes      Yes                          +---------+---------------+---------+-----------+----------+-------+ SFJ      Full                                                 +---------+---------------+---------+-----------+----------+-------+ FV Prox  Full                                                 +---------+---------------+---------+-----------+----------+-------+ FV Mid   Full                                                 +---------+---------------+---------+-----------+----------+-------+ FV DistalFull                                                 +---------+---------------+---------+-----------+----------+-------+ PFV      Full                                                 +---------+---------------+---------+-----------+----------+-------+ POP      Full           Yes      Yes                          +---------+---------------+---------+-----------+----------+-------+ PTV      Full                                                 +---------+---------------+---------+-----------+----------+-------+  PERO     Full                                                 +---------+---------------+---------+-----------+----------+-------+    Summary: Right: There is no evidence of deep vein thrombosis in the  lower extremity. No cystic structure found in the popliteal fossa. Left: There is no evidence of deep vein thrombosis in the lower extremity. No cystic structure found in the popliteal fossa.  *See table(s) above for measurements and observations. Electronically signed by Servando Snare MD on 08/22/2018 at 3:45:10 PM.    Final    Vas [place] Upper Extremity Venous Duplex  Result Date: 08/22/2018 UPPER VENOUS STUDY  Indications: History of PE Performing Technologist: Oliver Hum RVT  Examination Guidelines: A complete evaluation includes B-mode imaging, spectral Doppler, color Doppler, and power Doppler as needed of all accessible portions of each vessel. Bilateral testing is considered an integral part of a complete examination. Limited examinations for reoccurring indications may be performed as noted.  Right Findings: +----------+------------+----------+---------+-----------+-------+ RIGHT     CompressiblePropertiesPhasicitySpontaneousSummary +----------+------------+----------+---------+-----------+-------+ IJV           Full                 Yes       Yes            +----------+------------+----------+---------+-----------+-------+ Subclavian    Full                 Yes       Yes            +----------+------------+----------+---------+-----------+-------+ Axillary      Full                 Yes       Yes            +----------+------------+----------+---------+-----------+-------+ Brachial      Full                 Yes       Yes            +----------+------------+----------+---------+-----------+-------+ Radial        Full                                          +----------+------------+----------+---------+-----------+-------+ Ulnar         Full                                          +----------+------------+----------+---------+-----------+-------+ Cephalic    Partial                                  Acute   +----------+------------+----------+---------+-----------+-------+ Basilic       Full                                          +----------+------------+----------+---------+-----------+-------+ Cephalic superficial thrombus surrounding the patient's IV.  Left Findings: +----------+------------+----------+---------+-----------+-------+ LEFT      CompressiblePropertiesPhasicitySpontaneousSummary +----------+------------+----------+---------+-----------+-------+ IJV  Full                 Yes       Yes            +----------+------------+----------+---------+-----------+-------+ Subclavian    Full                 Yes       Yes            +----------+------------+----------+---------+-----------+-------+ Axillary      Full                 Yes       Yes            +----------+------------+----------+---------+-----------+-------+ Brachial      Full                 Yes       Yes            +----------+------------+----------+---------+-----------+-------+ Radial        Full                                          +----------+------------+----------+---------+-----------+-------+ Ulnar         Full                                          +----------+------------+----------+---------+-----------+-------+ Cephalic      Full                                          +----------+------------+----------+---------+-----------+-------+ Basilic       Full                                          +----------+------------+----------+---------+-----------+-------+  Summary:  Right: No evidence of deep vein thrombosis in the upper extremity. Findings consistent with acute superficial vein thrombosis involving the right cephalic vein.  Left: No evidence of deep vein thrombosis in the upper extremity. No evidence of superficial vein thrombosis in the upper extremity.  *See table(s) above for measurements and observations.  Diagnosing physician: Servando Snare  MD Electronically signed by Servando Snare MD on 08/22/2018 at 3:48:27 PM.    Final    I have reviewed Francene Finders, NP's note and agree with the documentation.  I personally performed a face-to-face visit, made revisions and my assessment and plan is as follows.   ASSESSMENT & PLAN:  Small cell carcinoma of lung, right ([place]) 1.  Extensive stage small cell lung cancer: -Patient noted bilateral axillary adenopathy starting in October 2019. - He was evaluated at [facility] with a biopsy of the axillary lymph node.  He was also diagnosed with small pulmonary embolism and was started on Eliquis. - On 08/21/2018, he presented to the ER with shortness of breath.  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 -Hence he was admitted to the hospital and was treated with first cycle of chemotherapy with carboplatin and VP-16 on 08/23/2018 along with radiation. -He is currently undergoing radiation therapy. - MRI of the brain on 08/22/2018 shows 3 subcentimeter lesions within the brain, present  in the right insula, right occipital lobe and right cerebellar hemisphere. -He did not experience any major side effects from his first cycle of chemotherapy.  He has mild tiredness. - I have talked to him about the normal prognosis of extensive stage small cell lung cancer and the treatment intent in palliative setting. - We have also talked about first-line therapy with chemo immunotherapy.  I plan to add durvalumab at 1500 mg on day 1 along with chemotherapy.  After 4 cycles of chemotherapy, durvalumab will be continued as maintenance.  We talked about the side effects of immunotherapy including but not limited to colitis, pneumonitis, hypophysitis, dermatitis among others. -We will see him back in 10 days to initiate cycle 2.  We will make a referral to Drs. Jenkins/Bridges for port placement.  2.  Brain metastasis: - MRI of the brain on 08/22/2018 showed 3 small subcentimeter metastasis in the right insula, right  occipital lobe and right cerebellar hemisphere. - WBRT added on 09/02/2018.  Orders Placed This Encounter  Procedures  . CBC with Differential/Platelet    Standing Status:   Standing    Number of Occurrences:   24    Standing Expiration Date:   09/03/2019  . Comprehensive metabolic panel    Standing Status:   Standing    Number of Occurrences:   24    Standing Expiration Date:   09/03/2019    All questions were answered. The patient knows to call the clinic with any problems, questions or concerns.      Derek Jack, MD 09/02/2018 4:18 PM

## 2018-09-03 ENCOUNTER — Ambulatory Visit
Admission: RE | Admit: 2018-09-03 | Discharge: 2018-09-03 | Disposition: A | Discharge status: Home / Self Care | Payer: Medicare Other | Source: Ambulatory Visit | Attending: Radiation Oncology | Admitting: Radiation Oncology

## 2018-09-03 DIAGNOSIS — Z510 Encounter for antineoplastic radiation therapy: Secondary | ICD-10-CM | POA: Diagnosis not present

## 2018-09-04 ENCOUNTER — Ambulatory Visit
Admission: RE | Admit: 2018-09-04 | Discharge: 2018-09-04 | Disposition: A | Discharge status: Home / Self Care | Payer: Medicare Other | Source: Ambulatory Visit | Attending: Radiation Oncology | Admitting: Radiation Oncology

## 2018-09-04 ENCOUNTER — Other Ambulatory Visit: Payer: Self-pay | Admitting: Radiation Oncology

## 2018-09-04 ENCOUNTER — Ambulatory Visit: Payer: Medicare Other

## 2018-09-04 DIAGNOSIS — Z510 Encounter for antineoplastic radiation therapy: Secondary | ICD-10-CM | POA: Diagnosis not present

## 2018-09-04 MED ORDER — ONDANSETRON HCL 8 MG PO TABS: 8.0000 mg | ORAL_TABLET | Freq: Three times a day (TID) | ORAL | 0 refills | Status: DC | PRN

## 2018-09-05 ENCOUNTER — Ambulatory Visit (INDEPENDENT_AMBULATORY_CARE_PROVIDER_SITE_OTHER): Payer: Medicare Other | Admitting: General Surgery

## 2018-09-05 ENCOUNTER — Ambulatory Visit
Admission: RE | Admit: 2018-09-05 | Discharge: 2018-09-05 | Disposition: A | Discharge status: Home / Self Care | Payer: Medicare Other | Source: Ambulatory Visit | Attending: Radiation Oncology | Admitting: Radiation Oncology

## 2018-09-05 ENCOUNTER — Encounter: Payer: Self-pay | Admitting: General Surgery

## 2018-09-05 VITALS — BP 96/58 | HR 64 | Temp 97.8°F | Resp 16 | Wt 193.4 lb

## 2018-09-05 DIAGNOSIS — C3491 Malignant neoplasm of unspecified part of right bronchus or lung: Secondary | ICD-10-CM | POA: Diagnosis not present

## 2018-09-05 DIAGNOSIS — Z510 Encounter for antineoplastic radiation therapy: Secondary | ICD-10-CM | POA: Diagnosis not present

## 2018-09-05 DIAGNOSIS — I871 Compression of vein: Secondary | ICD-10-CM | POA: Diagnosis not present

## 2018-09-05 NOTE — Progress Notes (Signed)
[name] History and Physical  Reason for Referral: Port a Catheter placement  Referring Physician: Dr. Sheran Lawless is a 73 y.o. male.  HPI: [name] is a 73 yo with a recently diagnosed metastatic lung cancer to is being treated with chemotherapy and radiation.  He presented with episodes of "passing out and nausea" that he thought was related to food poisoning after a trip to [facility].  He says that he woke up one night with sweats, nausea, and passed out. This happened more than one occasion.  He also reported some shortness of breath and was ultimately seen in [place] at [facility], and was diagnosed with a PE and started on Eliquis. They also biopsied his axillary adenopathy and diagnosed the metastatic small cell lung cancer.  He ultimately transferred care to [facility] and [facility] when he developed more shortness of breath, and was seen in the ED. He was noted to have SVC obstruction on CT scan.  He started his radiation 08/23/2018 and his chemotherapy at that time.  He is undergoing radiation daily.   The patient reports that his axillary adenopathy has improved significantly. He says that he did well with chemotherapy and that he had no major side effects. He denies any cough, chest pains, shortness of breath or facial swelling.     Past Medical History:  Diagnosis Date  . Actinic keratosis   . Arthritis   . Colitis MAY 2012    CT ABD/PELVIS HEP FLEXURE  . COPD (chronic obstructive pulmonary disease) ([place])   . Diabetes mellitus without complication ([place])   . History of kidney stones   . Hyperlipemia   . Hypertension   . NSAID long-term use NAPROXEN FOR OA  . SCL Ca dx'd 08/2018    Past Surgical History:  Procedure Laterality Date  . BACK SURGERY     spinal stenosis  . BASAL CELL CARCINOMA EXCISION  FACE, arms feet, leg  . CHOLECYSTECTOMY  JUNE 2011 MJ   STONES, PANCREATITIS  . KNEE ARTHROSCOPY WITH MEDIAL MENISECTOMY Right 03/24/2015   Procedure:  KNEE ARTHROSCOPY WITH MEDIAL MENISECTOMY;  Surgeon: Carole Civil, MD;  Location: [facility];  Service: Orthopedics;  Laterality: Right;  . KNEE SURGERY Left LEFT   arthroscopy  . LITHOTRIPSY  80s  . SPINE SURGERY      Family History  Problem Relation Age of Onset  . Cancer Mother        lung  . Cancer Father        lung and liver  . Colon polyps Neg Hx   . Colon cancer Neg Hx     Social History   Tobacco Use  . Smoking status: Former Smoker    Packs/day: 1.00    Years: 50.00    Pack years: 50.00    Types: Cigarettes    Last attempt to quit: 08/07/2018    Years since quitting: 0.0  . Smokeless tobacco: Never Used  Substance Use Topics  . Alcohol use: Yes    Comment: once a month  . Drug use: No    Medications: I have reviewed the patient's current medications. Allergies as of 09/05/2018   No Known Allergies     Medication List       Accurate as of September 05, 2018 11:59 PM. Always use your most recent med list.        albuterol 108 (90 Base) MCG/ACT inhaler Commonly known as:  PROVENTIL HFA;VENTOLIN HFA INHALE 2 PUFFS  INTO THE LUNGS QID   aspirin 81 MG tablet Take 81 mg by mouth daily.   Cholecalciferol 50 MCG (2000 UT) Tbdp Take 1 tablet by mouth daily.   metFORMIN 500 MG tablet Commonly known as:  GLUCOPHAGE Take 500 mg by mouth 2 (two) times daily with a meal.   multivitamin tablet Take 1 tablet by mouth daily.   ondansetron 8 MG tablet Commonly known as:  ZOFRAN Take 1 tablet (8 mg total) by mouth every 8 (eight) hours as needed for nausea or vomiting.   rosuvastatin 10 MG tablet Commonly known as:  CRESTOR Take 10 mg by mouth daily.   telmisartan-hydrochlorothiazide 40-12.5 MG tablet Commonly known as:  MICARDIS HCT Take 1 tablet by mouth daily.   verapamil 240 MG (CO) 24 hr tablet Commonly known as:  COVERA HS Take 240 mg by mouth at bedtime.        ROS:  A comprehensive review of systems was negative except for: Respiratory:  positive for cough, wheezing and some shortness breath, but nothing recent Genitourinary: positive for frequency Neurological: positive for headaches Endocrine: positive for tired/ sluggish Allergic/Immunologic: positive for lymphadenopathy  Blood pressure (!) 96/58, pulse 64, temperature 97.8 F (36.6 C), temperature source Temporal, resp. rate 16, weight 193 lb 6.4 oz (87.7 kg). Physical Exam Vitals signs reviewed.  Constitutional:      Appearance: Normal appearance.  HENT:     Head: Normocephalic and atraumatic.     Mouth/Throat:     Mouth: Mucous membranes are moist.  Eyes:     Extraocular Movements: Extraocular movements intact.     Pupils: Pupils are equal, round, and reactive to light.  Neck:     Musculoskeletal: Normal range of motion.  Cardiovascular:     Rate and Rhythm: Normal rate and regular rhythm.  Pulmonary:     Effort: Pulmonary effort is normal.     Breath sounds: Normal breath sounds.  Abdominal:     General: There is no distension.     Palpations: Abdomen is soft.     Tenderness: There is no abdominal tenderness.  Musculoskeletal: Normal range of motion.        General: No swelling.  Lymphadenopathy:     Cervical: No cervical adenopathy.     Upper Body:     Right upper body: Axillary adenopathy present.     Left upper body: Axillary adenopathy present.  Skin:    General: Skin is warm and dry.  Neurological:     General: No focal deficit present.     Mental Status: He is alert and oriented to person, place, and time.  Psychiatric:        Mood and Affect: Mood normal.        Behavior: Behavior normal.        Thought Content: Thought content normal.        Judgment: Judgment normal.     Results: CTA  1/12020- personally reviewed -Bulky adenopathy in the hilar region, compression of the SVC proximal and mid, difficult to follow left subclavian vein, right subclavian with significant collaterals, axillary adenopathy and some supraclavicular  adenopathy   Assessment & Plan:  [name] is a 73 y.o. male with small cell lung cancer. Responding to treatment. He does have some SVC obstruction and collateralization on his CT from 1/15.  I spoke with Dr. Delton Coombes personally and he feels that this should have improved with his treatments given the radiation and the chemotherapy. His axillary adenopathy has improved  per the patient.  The patient denies any facial swelling or more shortness of breath.  We have discussed his port placement and I told him that I would try on the left. I discussed the risk of port placement being bleeding, infection, and pneumothorax, malfunction of the port but also in him the risk of inability to place due to the SVC obstruction and the risk of having issues after placement like facial swelling due to the catheter obstructing flow more.  He expressed understanding. I also discussed the option of IR placement with venography to help them, but that I was unsure of the timing for this and if that would delay placement. He wants to proceed.  - Port placement on the left    Virl Cagey 09/06/2018, 11:56 AM

## 2018-09-05 NOTE — Patient Instructions (Signed)

## 2018-09-06 ENCOUNTER — Ambulatory Visit
Admission: RE | Admit: 2018-09-06 | Discharge: 2018-09-06 | Disposition: A | Discharge status: Home / Self Care | Payer: Medicare Other | Source: Ambulatory Visit | Attending: Radiation Oncology | Admitting: Radiation Oncology

## 2018-09-06 ENCOUNTER — Other Ambulatory Visit: Payer: Self-pay | Admitting: Radiation Oncology

## 2018-09-06 DIAGNOSIS — Z510 Encounter for antineoplastic radiation therapy: Secondary | ICD-10-CM | POA: Diagnosis not present

## 2018-09-06 MED ORDER — SUCRALFATE 1 G PO TABS: 1.0000 g | ORAL_TABLET | Freq: Four times a day (QID) | ORAL | 2 refills | Status: DC

## 2018-09-06 NOTE — H&P (Addendum)
[name] History and Physical  Reason for Referral: Port a Catheter placement  Referring Physician: Dr. Sheran Short is a 73 y.o. male.  HPI: [name] is a 73 yo with a recently diagnosed metastatic lung cancer to is being treated with chemotherapy and radiation.  He presented with episodes of "passing out and nausea" that he thought was related to food poisoning after a trip to [facility].  He says that he woke up one night with sweats, nausea, and passed out. This happened more than one occasion.  He also reported some shortness of breath and was ultimately seen in [place] at [place], and was diagnosed with a PE and started on Eliquis. They also biopsied his axillary adenopathy and diagnosed the metastatic small cell lung cancer.  He ultimately transferred care to [facility] when he developed more shortness of breath, and was seen in the ED. He was noted to have SVC obstruction on CT scan.  He started his radiation 08/23/2018 and his chemotherapy at that time.  He is undergoing radiation daily.   The patient reports that his axillary adenopathy has improved significantly. He says that he did well with chemotherapy and that he had no major side effects. He denies any cough, chest pains, shortness of breath or facial swelling.         Past Medical History:  Diagnosis Date  . Actinic keratosis   . Arthritis   . Colitis MAY 2012    CT ABD/PELVIS HEP FLEXURE  . COPD (chronic obstructive pulmonary disease) ([place])   . Diabetes mellitus without complication ([place])   . History of kidney stones   . Hyperlipemia   . Hypertension   . NSAID long-term use NAPROXEN FOR OA  . SCL Ca dx'd 08/2018    Past Surgical History:  Procedure Laterality Date  . BACK SURGERY     spinal stenosis  . BASAL CELL CARCINOMA EXCISION  FACE, arms feet, leg  . CHOLECYSTECTOMY  JUNE 2011 MJ   STONES, PANCREATITIS  . KNEE ARTHROSCOPY WITH MEDIAL MENISECTOMY Right  03/24/2015   Procedure: KNEE ARTHROSCOPY WITH MEDIAL MENISECTOMY;  Surgeon: Carole Civil, MD;  Location: [facility];  Service: Orthopedics;  Laterality: Right;  . KNEE SURGERY Left LEFT   arthroscopy  . LITHOTRIPSY  80s  . SPINE SURGERY           Family History  Problem Relation Age of Onset  . Cancer Mother        lung  . Cancer Father        lung and liver  . Colon polyps Neg Hx   . Colon cancer Neg Hx     Social History        Tobacco Use  . Smoking status: Former Smoker    Packs/day: 1.00    Years: 50.00    Pack years: 50.00    Types: Cigarettes    Last attempt to quit: 08/07/2018    Years since quitting: 0.0  . Smokeless tobacco: Never Used  Substance Use Topics  . Alcohol use: Yes    Comment: once a month  . Drug use: No    Medications: I have reviewed the patient's current medications. Allergies as of 09/05/2018   No Known Allergies        Medication List       Accurate as of September 05, 2018 11:59 PM. Always use your most recent med list.  albuterol 108 (90 Base) MCG/ACT inhaler Commonly known as:  PROVENTIL HFA;VENTOLIN HFA INHALE 2 PUFFS INTO THE LUNGS QID   aspirin 81 MG tablet Take 81 mg by mouth daily.   Cholecalciferol 50 MCG (2000 UT) Tbdp Take 1 tablet by mouth daily.   metFORMIN 500 MG tablet Commonly known as:  GLUCOPHAGE Take 500 mg by mouth 2 (two) times daily with a meal.   multivitamin tablet Take 1 tablet by mouth daily.   ondansetron 8 MG tablet Commonly known as:  ZOFRAN Take 1 tablet (8 mg total) by mouth every 8 (eight) hours as needed for nausea or vomiting.   rosuvastatin 10 MG tablet Commonly known as:  CRESTOR Take 10 mg by mouth daily.   telmisartan-hydrochlorothiazide 40-12.5 MG tablet Commonly known as:  MICARDIS HCT Take 1 tablet by mouth daily.   verapamil 240 MG (CO) 24 hr tablet Commonly known as:  COVERA HS Take 240 mg by mouth at bedtime.          ROS:  A comprehensive review of systems was negative except for: Respiratory: positive for cough, wheezing and some shortness breath, but nothing recent Genitourinary: positive for frequency Neurological: positive for headaches Endocrine: positive for tired/ sluggish Allergic/Immunologic: positive for lymphadenopathy  Blood pressure (!) 96/58, pulse 64, temperature 97.8 F (36.6 C), temperature source Temporal, resp. rate 16, weight 193 lb 6.4 oz (87.7 kg). Physical Exam Vitals signs reviewed.  Constitutional:      Appearance: Normal appearance.  HENT:     Head: Normocephalic and atraumatic.     Mouth/Throat:     Mouth: Mucous membranes are moist.  Eyes:     Extraocular Movements: Extraocular movements intact.     Pupils: Pupils are equal, round, and reactive to light.  Neck:     Musculoskeletal: Normal range of motion.  Cardiovascular:     Rate and Rhythm: Normal rate and regular rhythm.  Pulmonary:     Effort: Pulmonary effort is normal.     Breath sounds: Normal breath sounds.  Abdominal:     General: There is no distension.     Palpations: Abdomen is soft.     Tenderness: There is no abdominal tenderness.  Musculoskeletal: Normal range of motion.        General: No swelling.  Lymphadenopathy:     Cervical: No cervical adenopathy.     Upper Body:     Right upper body: Axillary adenopathy present.     Left upper body: Axillary adenopathy present.  Skin:    General: Skin is warm and dry.  Neurological:     General: No focal deficit present.     Mental Status: He is alert and oriented to person, place, and time.  Psychiatric:        Mood and Affect: Mood normal.        Behavior: Behavior normal.        Thought Content: Thought content normal.        Judgment: Judgment normal.     Results: CTA  1/12020- personally reviewed -Bulky adenopathy in the hilar region, compression of the SVC proximal and mid, difficult to follow left subclavian vein, right  subclavian with significant collaterals, axillary adenopathy and some supraclavicular adenopathy   Assessment & Plan:  [name] is a 73 y.o. male with small cell lung cancer. Responding to treatment. He does have some SVC obstruction and collateralization on his CT from 1/15.  I spoke with Dr. Delton Coombes personally and he feels that this  should have improved with his treatments given the radiation and the chemotherapy. His axillary adenopathy has improved per the patient.  The patient denies any facial swelling or more shortness of breath.  We have discussed his port placement and I told him that I would try on the left. I discussed the risk of port placement being bleeding, infection, and pneumothorax, malfunction of the port but also in him the risk of inability to place due to the SVC obstruction and the risk of having issues after placement like facial swelling due to the catheter obstructing flow more.  He expressed understanding. I also discussed the option of IR placement with venography to help them, but that I was unsure of the timing for this and if that would delay placement. He wants to proceed.  - Port placement on the left   - He is no longer on Eliquis.   Virl Cagey 09/06/2018, 11:56 AM

## 2018-09-09 ENCOUNTER — Ambulatory Visit
Admission: RE | Admit: 2018-09-09 | Discharge: 2018-09-09 | Disposition: A | Discharge status: Home / Self Care | Payer: Medicare Other | Source: Ambulatory Visit | Attending: Radiation Oncology | Admitting: Radiation Oncology

## 2018-09-09 ENCOUNTER — Encounter (HOSPITAL_COMMUNITY): Payer: Self-pay

## 2018-09-09 ENCOUNTER — Ambulatory Visit: Payer: Medicare Other

## 2018-09-09 ENCOUNTER — Encounter (HOSPITAL_COMMUNITY)
Admission: RE | Admit: 2018-09-09 | Discharge: 2018-09-09 | Disposition: A | Discharge status: Home / Self Care | Payer: Medicare Other | Source: Ambulatory Visit | Attending: General Surgery | Admitting: General Surgery

## 2018-09-09 DIAGNOSIS — Z510 Encounter for antineoplastic radiation therapy: Secondary | ICD-10-CM | POA: Insufficient documentation

## 2018-09-09 DIAGNOSIS — C771 Secondary and unspecified malignant neoplasm of intrathoracic lymph nodes: Secondary | ICD-10-CM | POA: Insufficient documentation

## 2018-09-09 NOTE — Patient Instructions (Addendum)
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  You will be treated with carboplatin, etoposide, and durvalumab (Imfinzi) every 21 days for three more cycles, then we plan to give you maintenance therapy with durvalumab (Imfinzi) only. The intent of your treatment is palliative.  This means while your cancer is not curable, we will treat you with the intent to control your cancer and relieve symptoms you have related to your cancer. You will see the doctor regularly throughout treatment.  We monitor your lab work prior to every treatment. The doctor monitors your response to treatment by the way you are feeling, your blood work, and scans periodically. There will be wait times while you are here for treatment.  It will take about 30 minutes to 1 hour for your lab work to result.  Then there will be wait times while pharmacy mixes your medications.   Carboplatin (Paraplatin, CBDCA)  About This Drug  Carboplatin is used to treat cancer. It is given in the vein (IV).  Possible Side Effects  . Bone marrow suppression. This is a decrease in the number of white blood cells, red blood cells, and platelets. This may raise your risk of infection, make you tired and weak (fatigue), and raise your risk of bleeding. . Nausea and vomiting (throwing up) . Weakness . Changes in your liver function . Changes in your kidney function . Electrolyte changes . Pain  Note: Each of the side effects above was reported in 20% or greater of patients treated with carboplatin. Not all possible side effects are included above.  Warnings and Precautions  . Severe bone marrow suppression . Allergic reactions, including anaphylaxis are rare but may happen in some patients. Signs of allergic reaction to this drug may be swelling of the face, feeling like your tongue or throat are swelling, trouble breathing, rash, itching, fever, chills,  feeling dizzy, and/or feeling that your heart is beating in a fast or not normal way. If this happens, do not take another dose of this drug. You should get urgent medical treatment. . Severe nausea and vomiting . Effects on the nerves are called peripheral neuropathy. This risk is increased if you are over the age of 40 or if you have received other medicine with risk of peripheral neuropathy. You may feel numbness, tingling, or pain in your hands and feet. It may be hard for you to button your clothes, open jars, or walk as usual. The effect on the nerves may get worse with more doses of the drug. These effects get better in some people after the drug is stopped but it does not get better in all people. Marland Kitchen Blurred vision, loss of vision or other changes in eyesight . Decreased hearing - Skin and tissue irritation including redness, pain, warmth, or swelling at the IV site if the drug leaks out of the vein and into nearby tissue. . Severe changes in your kidney function, which can cause kidney failure . Severe changes in your liver function, which can cause liver failure  Note: Some of the side effects above are very rare. If you have concerns and/or questions, please discuss them with your medical team.  Important Information . This drug may be present in the saliva, tears, sweat, urine, stool, vomit, semen, and vaginal secretions. Talk to your doctor and/or your nurse about the necessary precautions to take during this time.  Treating Side Effects  . Manage tiredness  by pacing your activities for the day. . Be sure to include periods of rest between energy-draining activities. . To decrease the risk of infection, wash your hands regularly. . Avoid close contact with people who have a cold, the flu, or other infections. . Take your temperature as your doctor or nurse tells you, and whenever you feel like you may have a fever. . To help decrease the risk of bleeding, use a soft  toothbrush. Check with your nurse before using dental floss. . Be very careful when using knives or tools. . Use an electric shaver instead of a razor. . Drink plenty of fluids (a minimum of eight glasses per day is recommended). . If you throw up or have loose bowel movements, you should drink more fluids so that you do not become dehydrated (lack of water in the body from losing too much fluid). . To help with nausea and vomiting, eat small, frequent meals instead of three large meals a day. Choose foods and drinks that are at room temperature. Ask your nurse or doctor about other helpful tips and medicine that is available to help stop or lessen these symptoms. . If you have numbness and tingling in your hands and feet, be careful when cooking, walking, and handling sharp objects and hot liquids. Marland Kitchen Keeping your pain under control is important to your well-being. Please tell your doctor or nurse if you are experiencing pain.  Food and Drug Interactions . There are no known interactions of carboplatin with food. . This drug may interact with other medicines. Tell your doctor and pharmacist about all the prescription and over-the-counter medicines and dietary supplements (vitamins, minerals, herbs and others) that you are taking at this time. Also, check with your doctor or pharmacist before starting any new prescription or over-the-counter medicines, or dietary supplements to make sure that there are no interactions.  When to Call the Doctor  Call your doctor or nurse if you have any of these symptoms and/or any new or unusual symptoms:  . Fever of 100.4 F (38 C) or higher . Chills . Tiredness that interferes with your daily activities . Feeling dizzy or lightheaded . Easy bleeding or bruising . Nausea that stops you from eating or drinking and/or is not relieved by prescribed medicines . Throwing up more than 3 times a day . Blurred vision or other changes in eyesight .  Decrease in hearing or ringing in the ear . Signs of allergic reaction: swelling of the face, feeling like your tongue or throat are swelling, trouble breathing, rash, itching, fever, chills, feeling dizzy, and/or feeling that your heart is beating in a fast or not normal way. If this happens, call 911 for emergency care. . Signs of possible liver problems: dark urine, pale bowel movements, bad stomach pain, feeling very tired and weak, unusual itching, or yellowing of the eyes or skin . Decreased urine, or very dark urine . Numbness, tingling, or pain in your hands and feet . Pain that does not go away or is not relieved by prescribed medicine  . While you are getting this drug, please tell your nurse right away if you have any pain, redness, or swelling at the site of the IV infusion, or if you have any new onset of symptoms, or if you just feel "different" from before when the infusion was started.   Reproduction Warnings  . Pregnancy warning: This drug may have harmful effects on the unborn baby. Women of child bearing potential should  use effective methods of birth control during your cancer treatment. Let your doctor know right away if you think you may be pregnant. . Breastfeeding warning: It is not known if this drug passes into breast milk. For this reason, women should not breastfeed during treatment because this drug could enter the breast milk and cause harm to a breastfeeding baby. . Fertility warning: Human fertility studies have not been done with this drug. Talk with your doctor or nurse if you plan to have children. Ask for information on sperm or egg banking.  Etoposide  About This Drug  Etoposide is used to treat cancer. It is given in the vein (IV) and orally (by mouth).  Possible Side Effects  . Bone marrow suppression. This is a decrease in the number of white blood cells, red blood cells, and platelets. This may raise your risk of infection, make you tired and  weak (fatigue), and raise your risk of bleeding. . Nausea and vomiting (throwing up) . Hair loss. Hair loss is often temporary, although with certain medicine, hair loss can sometimes be permanent. Hair loss may happen suddenly or gradually. If you lose hair, you may lose it from your head, face, armpits, pubic area, chest, and/or legs. You may also notice your hair getting thin. Note: Each of the side effects above was reported in 20% or greater of patients treated with etoposide. Not all possible side effects are included above.  Warnings and Precautions  . Severe bone marrow suppression, which may be life-threatening . This drug may raise your risk of getting a second cancer, such as leukemia . Low blood pressure with rapid infusion of the medication . Allergic reactions, including anaphylaxis are rare but may happen in some patients. Signs of allergic reaction to this drug may be swelling of the face, feeling like your tongue or throat are swelling, trouble breathing, rash, itching, fever, chills, feeling dizzy, and/or feeling that your heart is beating in a fast or not normal way. If this happens, do not take another dose of this drug. You should get urgent medical treatment. . These side effects may be more severe if you are receiving high doses of this medication included in pre-transplant chemotherapy.  Treating Side Effects  . Manage tiredness by pacing your activities for the day. . Be sure to include periods of rest between energy-draining activities. . To decrease the risk of infection, wash your hands regularly. . Avoid close contact with people who have a cold, the flu, or other infections. . Take your temperature as your doctor or nurse tells you, and whenever you feel like you may have a fever. . To help decrease bleeding, use a soft toothbrush. Check with your nurse before using dental floss. . Be very careful when using knives or tools. . Use an electric shaver  instead of a razor. . Drink plenty of fluids (a minimum of eight glasses per day is recommended). . If you throw up or have loose bowel movements, you should drink more fluids so that you do not become dehydrated (lack of water in the body from losing too much fluid). . To help with nausea and vomiting, eat small, frequent meals instead of three large meals a day. Choose foods and drinks that are at room temperature. Ask your nurse or doctor about other helpful tips and medicine that is available to help or stop lessen these symptoms. . To help with hair loss, wash with a mild shampoo and avoid washing your hair every  day. . Avoid rubbing your scalp, pat your hair or scalp dry. . Avoid coloring your hair. . Limit your use of hair spray, electric curlers, blow dryers, and curling irons. . If you are interested in getting a wig, talk to your nurse. You can also call the [place] at [phone] to find out information about the "Look Good, Feel Better" program close to where you live. It is a free program where women getting chemotherapy can learn about wigs, turbans and scarves as well as makeup techniques and skin and nail care.  Food and Drug Interactions . There are no known interactions of etoposide with food. . This drug may interact with other medicines. Tell your doctor and pharmacist about all the medicines and dietary supplements (vitamins, minerals, herbs and others) that you are taking at this time. The safety and use of dietary supplements and alternative diets are often not known. Using these might affect your cancer or interfere with your treatment. Until more is known, you should not use dietary supplements or alternative diets without your cancer doctor's help. . There are known interactions of etoposide with blood thinning medicine such as warfarin. Ask your doctor what precautions you should take.  When to Call the Doctor  Call your doctor or nurse if you  have any of these symptoms and/or any new or unusual symptoms:  . Fever of 100.4 F (38 C) or higher . Chills . Tiredness that interferes with your daily activities . Feeling dizzy or lightheaded . Feeling that your heart is beating in a fast or not normal way (palpitations) . Easy bleeding or bruising . Nausea that stops you from eating or drinking and/or is not relieved by prescribed medicines . Throwing up more than 3 times a day . Signs of allergic reaction: swelling of the face, feeling like your tongue or throat are swelling, trouble breathing, rash, itching, fever, chills, feeling dizzy, and/or feeling that your heart is beating in a fast or not normal way . If you think you may be pregnant or may have impregnated your partner  Reproduction Warnings  . Pregnancy warning: This drug can have harmful effects on the unborn baby. Women of childbearing potential should use effective methods of birth control during your cancer treatment and for at least 6 months after treatment. Men with male partners of childbearing potential should use effective methods of birth control during your cancer treatment and for at least 4 months after your cancer treatment. Let your doctor know right away if you think you may be pregnant or may have impregnated your partner. . Breastfeeding warning: It is not known if this drug passes into breast milk. For this reason, women should talk to their doctor about the risks and benefits of breastfeeding during treatment with this drug because this drug may enter the breast milk and cause harm to a breastfeeding baby. . Fertility warning: In men and women both, this drug may affect your ability to have children in the future. Talk with your doctor or nurse if you plan to have children. Ask for information on sperm or egg banking.  Durvalumab        ([id number])  Trade Name(s): Imfinzi  You will receive durvalumab (Imfinzi) through your port a cath  once every three weeks. You will be monitored for an infusion reaction which is a rare occurrence.  Side Effects  Important things to remember about the side effects of durvalumab: Most people will not experience all of the durvalumab  side effects listed. Side effects are often predictable in terms of their onset, duration, and severity. Side effects are almost always reversible and will go away after therapy is complete. Side effects may be quite manageable. There are many options to minimize or prevent side effects of durvalumab.  The following side effects are common (occurring in greater than 30%) for patients taking durvalumab:  Fatigue Infection  These are less common side effects (occurring in 10-29%) for patients receiving durvalumab:  Muscle and/or bone pain Constipation Decreased appetite Rash Nausea Swelling Urinary tract infections Abdominal pain Fever Colitis Diarrhea Decreased sodium level Decreased lymphocyte count (a type of white blood cell)  The following are rare but serious complications of durvalumab therapy triggered by an auto-immune reaction where the immune system goes after normal cells in the body. This can happen at any time while taking, and/or after stopping durvalumab. Contact your healthcare provider immediately if you have signs/symptoms of the following:  Pneumonitis (lung problems) identified by:  New or worsening cough Shortness of breath Chest pain  Hepatitis (liver problems) identified by:  Yellowing of your skin or the whites of your eyes Severe nausea and vomiting Pain on the right side of your stomach Dark, tea colored urine Bleeding or bruising more easily than normal  Colitis (intestinal problems) identified by:  Diarrhea Blood in your stools or dark, tarry stool Severe stomach pain or tenderness  Hormone gland problems (thyroid gland, adrenal gland, and pancreas) identified by:  Rapid heart beat Increased  sweating Extreme tiredness Unexpected weight gain or loss Feeling more hungry or thirsty High blood sugar Hair loss Irritability or forgetfulness Constipation Deepening of your voice Low blood pressure More frequent urination Stomach pain Kidney problems identified by: Less frequent urination Blood in your urine Ankle swelling Loss of appetite  Other organ problems:  Headache, change in balance, confusion Severe muscle weakness or pain Chest pain and tightness Trouble breathing Skin rash Change in heartbeat Flu like symptoms  Not all side effects are listed above. Side effects that are very rare -- occurring in less than about 10 percent of patients -- are not listed here. But you should always inform your health care provider if you experience any unusual symptoms.  When to Contact Your Doctor or [place] Provider  Contact your health care provider immediately, day or night, if you should experience any of the following symptoms:  Fever of 100.4 F (38 C) or higher, chills (possible signs of infection) Shortness of breath, cough Confusion, imbalance  The following symptoms require medical attention, but are not an emergency. Contact your health care provider within 24 hours of noticing any of the following:  Nausea (interferes with ability to eat and unrelieved with prescribed medication) Vomiting (vomiting more than 4-5 times in a 24-hour period) Diarrhea (4-6 episodes in a 24-hour period) Unusual bleeding or bruising Black or tarry stools, or blood in your stools Blood in the urine Pain or burning with urination Extreme fatigue (unable to carry on self-care activities) Yellowing of skin or eyes Constipation unrelieved by laxatives use. Always inform your health care provider if you experience any unusual symptoms.  Precautions  Before starting durvalumab treatment, make sure you tell your doctor about any other medications you are taking (including  prescription, over-the-counter, vitamins, herbal remedies, etc.).  Do not receive any kind of immunization or vaccination without your doctor's approval while taking durvalumab. Inform your health care professional if you are pregnant or may be pregnant prior to starting this treatment.  Pregnancy category X (Durvalumab may cause fetal harm when given to a pregnant woman. This drug must not be given to a pregnant woman or a woman who intends to become pregnant. If a woman becomes pregnant while taking durvalumab, the medication must be stopped immediately and the woman given appropriate counseling.  For both men and women: Use contraceptives, and do not conceive a child (get pregnant) while taking durvalumab. Barrier methods of contraception, such as condoms, are recommended for up to 3 months after last dose of durvalumab.  Do not breast feed while taking durvalumab or for at least 3 months after last dose of durvalumab.  Self-Care Tips  Drink at least two to three quarts of fluid every 24 hours, unless you are instructed otherwise. You may be at risk of infection so try to avoid crowds or people with colds, and report fever or any other signs of infection immediately to your health care provider. Wash your hands often. To reduce nausea, take anti-nausea medication as prescribed by your doctor, and eat small, frequent meals. Follow regimen of anti-diarrhea medication as prescribed by your health care professional. Eat foods that may help reduce diarrhea (see managing side effects - diarrhea). In general, drinking alcoholic beverages should be kept to a minimum or avoided completely. You should discuss this with your doctor. Get plenty of rest. Maintain good nutrition. Remain active as you are able. Gentle exercise is encouraged such as a daily walk.  If you experience symptoms or side effects, be sure to discuss them with your health care team. They can prescribe medications and/or offer  other suggestions that are effective in managing such problems.  Monitoring and Testing While Taking Durvalumab  You will be checked regularly by your doctor while you are taking durvalumab to monitor side effects and check your response to therapy. Periodic blood work will be obtained to monitor your complete blood count (CBC), glucose, as well as the function of other organs (such as your kidneys, liver, thyroid) will also be ordered by your doctor.  How Durvalumab Works  Monoclonal antibodies are a relatively new type of "targeted" cancer therapy. Antibodies are an important part of the body's immune system. Normally, antibodies are produced by the body in response to an antigen, such as protein in a germ that the body recognizes as foreign. The antibodies attach to the antigen in order to mark it for destruction by the immune system.  Since monoclonal antibodies target only specific cells, they generally cause less toxicity to healthy cells. A limitation to monoclonal antibody use is that they can only be used for cancer in which antigens (and antibodies that bind them) have been identified.  Programmed death ligand 1 (PD-L1) is a specific protein (an immune check point protein) on tumor cells that binds to programmed death 1 (PD-1) on our body's anti-tumor t-cells which stops our body's t-cells from attacking the tumor cells. Durvalumab is a human immunoglobulin monoclonal antibody in a class called check point inhibitors. Durvalumab blocks PD-L1 on tumor cells from binding to PD-1 and CD80 on our t-cells. This allows for increased t-cell activation, which then frees up our antitumor t-cells and allows them to attack the cancer cells.  SELF CARE ACTIVITIES WHILE ON CHEMOTHERAPY:  Hydration Increase your fluid intake 48 hours prior to treatment and drink at least 8 to 12 cups (64 ounces) of water/decaffeinated beverages per day after treatment. You can still have your cup of coffee or soda but  these beverages do not count  as part of your 8 to 12 cups that you need to drink daily. No alcohol intake.  Medications Continue taking your normal prescription medication as prescribed.  If you start any new herbal or new supplements please let us know first to make sure it is safe.  Mouth Care Have teeth cleaned professionally before starting treatment. Keep dentures and partial plates clean. Use soft toothbrush and do not use mouthwashes that contain alcohol. Biotene is a good mouthwash that is available at most pharmacies or may be ordered by calling [phone]. Use warm salt water gargles (1 teaspoon salt per 1 quart warm water) before and after meals and at bedtime. Or you may rinse with 2 tablespoons of three-percent hydrogen peroxide mixed in eight ounces of water. If you are still having problems with your mouth or sores in your mouth please call the clinic. If you need dental work, please let the doctor know before you go for your appointment so that we can coordinate the best possible time for you in regards to your chemo regimen. You need to also let your dentist know that you are actively taking chemo. We may need to do labs prior to your dental appointment.  Skin Care Always use sunscreen that has not expired and with SPF (Sun Protection Factor) of 50 or higher. Wear hats to protect your head from the sun. Remember to use sunscreen on your hands, ears, face, & feet.  Use good moisturizing lotions such as udder cream, eucerin, or even Vaseline. Some chemotherapies can cause dry skin, color changes in your skin and nails.    . Avoid long, hot showers or baths. . Use gentle, fragrance-free soaps and laundry detergent. . Use moisturizers, preferably creams or ointments rather than lotions because the thicker consistency is better at preventing skin dehydration. Apply the cream or ointment within 15 minutes of showering. Reapply moisturizer at night, and moisturize your hands every time  after you wash them.  Hair Loss (if your doctor says your hair will fall out)  . If your doctor says that your hair is likely to fall out, decide before you begin chemo whether you want to wear a wig. You may want to shop before treatment to match your hair color. . Hats, turbans, and scarves can also camouflage hair loss, although some people prefer to leave their heads uncovered. If you go bare-headed outdoors, be sure to use sunscreen on your scalp. . Cut your hair short. It eases the inconvenience of shedding lots of hair, but it also can reduce the emotional impact of watching your hair fall out. . Don't perm or color your hair during chemotherapy. Those chemical treatments are already damaging to hair and can enhance hair loss. Once your chemo treatments are done and your hair has grown back, it's OK to resume dyeing or perming hair. With chemotherapy, hair loss is almost always temporary. But when it grows back, it may be a different color or texture. In older adults who still had hair color before chemotherapy, the new growth may be completely gray.  Often, new hair is very fine and soft.  Infection Prevention Please wash your hands for at least 30 seconds using warm soapy water. Handwashing is the #1 way to prevent the spread of germs. Stay away from sick people or people who are getting over a cold. If you develop respiratory systems such as green/yellow mucus production or productive cough or persistent cough let us know and we will see if you  need an antibiotic. It is a good idea to keep a pair of gloves on when going into grocery stores/[employer] to decrease your risk of coming into contact with germs on the carts, etc. Carry alcohol hand gel with you at all times and use it frequently if out in public. If your temperature reaches 100.5 or higher please call the clinic and let us know.  If it is after hours or on the weekend please go to the ER if your temperature is over 100.5.  Please have  your own personal thermometer at home to use.    Sex and bodily fluids If you are going to have sex, a condom must be used to protect the person that isn't taking chemotherapy. Chemo can decrease your libido (sex drive). For a few days after chemotherapy, chemotherapy can be excreted through your bodily fluids.  When using the toilet please close the lid and flush the toilet twice.  Do this for a few day after you have had chemotherapy.   Effects of chemotherapy on your sex life Some changes are simple and won't last long. They won't affect your sex life permanently. Sometimes you may feel: . too tired . not strong enough to be very active . sick or sore  . not in the mood . anxious or low Your anxiety might not seem related to sex. For example, you may be worried about the cancer and how your treatment is going. Or you may be worried about money, or about how you family are coping with your illness. These things can cause stress, which can affect your interest in sex. It's important to talk to your partner about how you feel. Remember - the changes to your sex life don't usually last long. There's usually no medical reason to stop having sex during chemo. The drugs won't have any long term physical effects on your performance or enjoyment of sex. Cancer can't be passed on to your partner during sex  Contraception It's important to use reliable contraception during treatment. Avoid getting pregnant while you or your partner are having chemotherapy. This is because the drugs may harm the baby. Sometimes chemotherapy drugs can leave a man or woman infertile.  This means you would not be able to have children in the future. You might want to talk to someone about permanent infertility. It can be very difficult to learn that you may no longer be able to have children. Some people find counselling helpful. There might be ways to preserve your fertility, although this is easier for men than for women.  You may want to speak to a fertility expert. You can talk about sperm banking or harvesting your eggs. You can also ask about other fertility options, such as donor eggs. If you have or have had breast cancer, your doctor might advise you not to take the contraceptive pill. This is because the hormones in it might affect the cancer.  It is not known for sure whether or not chemotherapy drugs can be passed on through semen or secretions from the vagina. Because of this some doctors advise people to use a barrier method if you have sex during treatment. This applies to vaginal, anal or oral sex. Generally, doctors advise a barrier method only for the time you are actually having the treatment and for about a week after your treatment. Advice like this can be worrying, but this does not mean that you have to avoid being intimate with your partner. You can still  have close contact with your partner and continue to enjoy sex.  Animals If you have cats or birds we just ask that you not change the litter or change the cage.  Please have someone else do this for you while you are on chemotherapy.   Food Safety During and After Cancer Treatment  Food safety is important for people both during and after cancer treatment. Cancer and cancer treatments, such as chemotherapy, radiation therapy, and stem cell/bone marrow transplantation, often weaken the immune system. This makes it harder for your body to protect itself from foodborne illness, also called food poisoning. Foodborne illness is caused by eating food that contains harmful bacteria, parasites, or viruses.  Foods to avoid  Some foods have a higher risk of becoming tainted with bacteria. These include: Marland Kitchen Unwashed fresh fruit and vegetables, especially leafy vegetables that can hide dirt and other contaminants . Raw sprouts, such as alfalfa sprouts . Raw or undercooked beef, especially ground beef, or other raw or undercooked meat and poultry . Fatty,  fried, or spicy foods immediately before or after treatment.  These can sit heavy on your stomach and make you feel nauseous. . Raw or undercooked shellfish, such as oysters. . Sushi and sashimi, which often contain raw fish.  . Unpasteurized beverages, such as unpasteurized fruit juices, raw milk, raw yogurt, or cider . Undercooked eggs, such as soft boiled, over easy, and poached; raw, unpasteurized eggs; or foods made with raw egg, such as homemade raw cookie dough and homemade mayonnaise  Simple steps for food safety  Shop smart. . Do not buy food stored or displayed in an unclean area. . Do not buy bruised or damaged fruits or vegetables. . Do not buy cans that have cracks, dents, or bulges. . Pick up foods that can spoil at the end of your shopping trip and store them in a cooler on the way home. Prepare and clean up foods carefully. . Rinse all fresh fruits and vegetables under running water, and dry them with a clean towel or paper towel. . Clean the top of cans before opening them. . After preparing food, wash your hands for 20 seconds with hot water and soap. Pay special attention to areas between fingers and under nails. . Clean your utensils and dishes with hot water and soap. Marland Kitchen Disinfect your kitchen and cutting boards using 1 teaspoon of liquid, unscented bleach mixed into 1 quart of water.    Dispose of old food. . Eat canned and packaged food before its expiration date (the "use by" or "best before" date). . Consume refrigerated leftovers within 3 to 4 days. After that time, throw out the food. Even if the food does not smell or look spoiled, it still may be unsafe. Some bacteria, such as Listeria, can grow even on foods stored in the refrigerator if they are kept for too long.  Take precautions when eating out. . At restaurants, avoid buffets and salad bars where food sits out for a long time and comes in contact with many people. Food can become contaminated when someone  with a virus, often a norovirus, or another "bug" handles it. . Put any leftover food in a "to-go" container yourself, rather than having the server do it. And, refrigerate leftovers as soon as you get home. . Choose restaurants that are clean and that are willing to prepare your food as you order it cooked.   MEDICATIONS:  Compazine/Prochlorperazine '10mg'$  tablet. Take 1 tablet every 6 hours as needed for nausea/vomiting. (This can make you sleepy)   EMLA cream. Apply a quarter size amount to port site 1 hour prior to chemo. Do not rub in. Cover with plastic wrap.   Over-the-Counter Meds:  Colace - 100 mg capsules - take 2 capsules daily.  If this doesn't help then you can increase to 2 capsules twice daily.  Call us if this does not help your bowels move.   Imodium '2mg'$  capsule. Take 2 capsules after the 1st loose stool and then 1 capsule every 2 hours until you go a total of 12 hours without having a loose stool. Call the [facility] if loose stools continue. If diarrhea occurs at bedtime, take 2 capsules at bedtime. Then take 2 capsules every 4 hours until morning. Call [facility].    Diarrhea Sheet   If you are having loose stools/diarrhea, please purchase Imodium and begin taking as outlined:  At the first sign of poorly formed or loose stools you should begin taking Imodium (loperamide) 2 mg capsules.  Take two caplets ('4mg'$ ) followed by one caplet ('2mg'$ ) every 2 hours until you have had no diarrhea for 12 hours.  During the night take two caplets ('4mg'$ ) at bedtime and continue every 4 hours during the night until the morning.  Stop taking Imodium only after there is no sign of diarrhea for 12 hours.    Always call the [facility] if you are having loose stools/diarrhea that you can't get under control.  Loose stools/diarrhea  leads to dehydration (loss of water) in your body.  We have other options of trying to get the loose stools/diarrhea to stop but you must let us know!   Constipation Sheet  Colace - 100 mg capsules - take 2 capsules daily.  If this doesn't help then you can increase to 2 capsules twice daily.  Please call if the above does not work for you.   Do not go more than 2 days without a bowel movement.  It is very important that you do not become constipated.  It will make you feel sick to your stomach (nausea) and can cause abdominal pain and vomiting.   Nausea Sheet   Compazine/Prochlorperazine '10mg'$  tablet. Take 1 tablet every 6 hours as needed for nausea/vomiting. (This can make you sleepy)  If you are having persistent nausea (nausea that does not stop) please call the [facility] and let us know the amount of nausea that you are experiencing.  If you begin to vomit, you need to call the [facility] and if it is the weekend and you have vomited more than one time and can't get it to stop-go to the Emergency Room.  Persistent nausea/vomiting can lead to dehydration (loss of fluid in your body) and will make you feel terrible.   Ice chips, sips of clear liquids, foods that are @ room temperature, crackers, and toast tend to be better tolerated.   SYMPTOMS TO REPORT AS SOON AS POSSIBLE AFTER TREATMENT:   FEVER GREATER THAN 100.5 F  CHILLS WITH OR WITHOUT FEVER  NAUSEA AND VOMITING THAT IS NOT CONTROLLED WITH YOUR NAUSEA MEDICATION  UNUSUAL SHORTNESS OF BREATH  UNUSUAL BRUISING OR BLEEDING  TENDERNESS IN MOUTH AND THROAT WITH OR WITHOUT PRESENCE OF ULCERS  URINARY PROBLEMS  BOWEL PROBLEMS  UNUSUAL RASH      Wear comfortable clothing and clothing appropriate for easy access to any Portacath or PICC line. Let us  know if there is anything that we can do to make your therapy better!    What to do if you need assistance after hours or on the weekends: CALL [phone].  HOLD  on the line, do not hang up.  You will hear multiple messages but at the end you will be connected with a nurse triage line.  They will contact the doctor if necessary.  Most of the time they will be able to assist you.  Do not call the hospital operator.      I have been informed and understand all of the instructions given to me and have received a copy. I have been instructed to call the clinic [phone] or my family physician as soon as possible for continued medical care, if indicated. I do not have any more questions at this time but understand that I may call the [facility] or the Patient Navigator at [phone] during office hours should I have questions or need assistance in obtaining follow-up care. [facility] Discharge Instructions for Patients Receiving Chemotherapy  Today you received the following chemotherapy agents  To help prevent nausea and vomiting after your treatment, we encourage you to take your nausea medications If you develop nausea and vomiting that is not controlled by your nausea medication, call the clinic.   BELOW ARE SYMPTOMS THAT SHOULD BE REPORTED IMMEDIATELY:  *FEVER GREATER THAN 100.5 F  *CHILLS WITH OR WITHOUT FEVER  NAUSEA AND VOMITING THAT IS NOT CONTROLLED WITH YOUR NAUSEA MEDICATION  *UNUSUAL SHORTNESS OF BREATH  *UNUSUAL BRUISING OR BLEEDING  TENDERNESS IN MOUTH AND THROAT WITH OR WITHOUT PRESENCE OF ULCERS  *URINARY PROBLEMS  *BOWEL PROBLEMS  UNUSUAL RASH Items with * indicate a potential emergency and should be followed up as soon as possible.  Feel free to call the clinic should you have any questions or concerns. The clinic phone number is [phone].  Please show the [facility] at check-in to the Emergency Department and triage nurse.

## 2018-09-10 ENCOUNTER — Ambulatory Visit
Admission: RE | Admit: 2018-09-10 | Discharge: 2018-09-10 | Disposition: A | Discharge status: Home / Self Care | Payer: Medicare Other | Source: Ambulatory Visit | Attending: Radiation Oncology | Admitting: Radiation Oncology

## 2018-09-10 DIAGNOSIS — Z510 Encounter for antineoplastic radiation therapy: Secondary | ICD-10-CM | POA: Diagnosis not present

## 2018-09-11 ENCOUNTER — Ambulatory Visit (HOSPITAL_COMMUNITY): Payer: Medicare Other | Admitting: Anesthesiology

## 2018-09-11 ENCOUNTER — Ambulatory Visit (HOSPITAL_COMMUNITY): Payer: Medicare Other

## 2018-09-11 ENCOUNTER — Encounter (HOSPITAL_COMMUNITY)
Admission: RE | Disposition: A | Discharge status: Home / Self Care | Payer: Self-pay | Source: Home / Self Care | Attending: General Surgery

## 2018-09-11 ENCOUNTER — Ambulatory Visit
Admission: RE | Admit: 2018-09-11 | Discharge: 2018-09-11 | Disposition: A | Discharge status: Home / Self Care | Payer: Medicare Other | Source: Ambulatory Visit | Attending: Radiation Oncology | Admitting: Radiation Oncology

## 2018-09-11 ENCOUNTER — Ambulatory Visit (HOSPITAL_COMMUNITY)
Admission: RE | Admit: 2018-09-11 | Discharge: 2018-09-11 | Disposition: A | Discharge status: Home / Self Care | Payer: Medicare Other | Attending: General Surgery | Admitting: General Surgery

## 2018-09-11 ENCOUNTER — Encounter (HOSPITAL_COMMUNITY): Payer: Self-pay

## 2018-09-11 DIAGNOSIS — Z85828 Personal history of other malignant neoplasm of skin: Secondary | ICD-10-CM | POA: Diagnosis not present

## 2018-09-11 DIAGNOSIS — Z7982 Long term (current) use of aspirin: Secondary | ICD-10-CM | POA: Insufficient documentation

## 2018-09-11 DIAGNOSIS — I10 Essential (primary) hypertension: Secondary | ICD-10-CM | POA: Diagnosis not present

## 2018-09-11 DIAGNOSIS — M1990 Unspecified osteoarthritis, unspecified site: Secondary | ICD-10-CM | POA: Diagnosis not present

## 2018-09-11 DIAGNOSIS — I700 Atherosclerosis of aorta: Secondary | ICD-10-CM | POA: Insufficient documentation

## 2018-09-11 DIAGNOSIS — J449 Chronic obstructive pulmonary disease, unspecified: Secondary | ICD-10-CM | POA: Insufficient documentation

## 2018-09-11 DIAGNOSIS — E119 Type 2 diabetes mellitus without complications: Secondary | ICD-10-CM | POA: Diagnosis not present

## 2018-09-11 DIAGNOSIS — Z79899 Other long term (current) drug therapy: Secondary | ICD-10-CM | POA: Diagnosis not present

## 2018-09-11 DIAGNOSIS — Z86711 Personal history of pulmonary embolism: Secondary | ICD-10-CM | POA: Insufficient documentation

## 2018-09-11 DIAGNOSIS — Z87891 Personal history of nicotine dependence: Secondary | ICD-10-CM | POA: Diagnosis not present

## 2018-09-11 DIAGNOSIS — Z7984 Long term (current) use of oral hypoglycemic drugs: Secondary | ICD-10-CM | POA: Diagnosis not present

## 2018-09-11 DIAGNOSIS — Z510 Encounter for antineoplastic radiation therapy: Secondary | ICD-10-CM | POA: Diagnosis not present

## 2018-09-11 DIAGNOSIS — Z95828 Presence of other vascular implants and grafts: Secondary | ICD-10-CM

## 2018-09-11 DIAGNOSIS — I871 Compression of vein: Secondary | ICD-10-CM | POA: Insufficient documentation

## 2018-09-11 DIAGNOSIS — C3491 Malignant neoplasm of unspecified part of right bronchus or lung: Secondary | ICD-10-CM | POA: Diagnosis present

## 2018-09-11 DIAGNOSIS — E785 Hyperlipidemia, unspecified: Secondary | ICD-10-CM | POA: Insufficient documentation

## 2018-09-11 DIAGNOSIS — C3490 Malignant neoplasm of unspecified part of unspecified bronchus or lung: Secondary | ICD-10-CM | POA: Insufficient documentation

## 2018-09-11 HISTORY — PX: PORTACATH PLACEMENT: SHX2246

## 2018-09-11 LAB — GLUCOSE, CAPILLARY: Glucose-Capillary: 103 mg/dL — ABNORMAL HIGH (ref 70–99)

## 2018-09-11 SURGERY — INSERTION, TUNNELED CENTRAL VENOUS DEVICE, WITH PORT
Anesthesia: Monitor Anesthesia Care | Site: Chest | Laterality: Left

## 2018-09-11 MED ORDER — HYDROCODONE-ACETAMINOPHEN 7.5-325 MG PO TABS: 1.0000 | ORAL_TABLET | Freq: Once | ORAL | Status: DC | PRN

## 2018-09-11 MED ORDER — HEPARIN SOD (PORK) LOCK FLUSH 100 UNIT/ML IV SOLN
INTRAVENOUS | Status: DC | PRN
  Administered 2018-09-11: 500 [IU]

## 2018-09-11 MED ORDER — HYDROCODONE-ACETAMINOPHEN 5-325 MG PO TABS: 1.0000 | ORAL_TABLET | ORAL | 0 refills | Status: DC | PRN

## 2018-09-11 MED ORDER — SODIUM CHLORIDE FLUSH 0.9 % IV SOLN
INTRAVENOUS | Status: DC | PRN
  Administered 2018-09-11: 30 mL

## 2018-09-11 MED ORDER — CHLORHEXIDINE GLUCONATE CLOTH 2 % EX PADS: 6.0000 | MEDICATED_PAD | Freq: Once | CUTANEOUS | Status: DC

## 2018-09-11 MED ORDER — CEFAZOLIN SODIUM-DEXTROSE 2-4 GM/100ML-% IV SOLN
2.0000 g | INTRAVENOUS | Status: AC
  Administered 2018-09-11: 2 g via INTRAVENOUS
  Filled 2018-09-11: qty 100

## 2018-09-11 MED ORDER — LIDOCAINE HCL (PF) 1 % IJ SOLN
INTRAMUSCULAR | Status: AC
  Filled 2018-09-11: qty 30

## 2018-09-11 MED ORDER — LACTATED RINGERS IV SOLN
INTRAVENOUS | Status: DC
  Administered 2018-09-11: 07:00:00 via INTRAVENOUS

## 2018-09-11 MED ORDER — PROPOFOL 500 MG/50ML IV EMUL
INTRAVENOUS | Status: DC | PRN
  Administered 2018-09-11: 75 ug/kg/min via INTRAVENOUS

## 2018-09-11 MED ORDER — PROPOFOL 10 MG/ML IV BOLUS
INTRAVENOUS | Status: DC | PRN
  Administered 2018-09-11 (×2): 10 mg via INTRAVENOUS

## 2018-09-11 MED ORDER — LIDOCAINE HCL (PF) 1 % IJ SOLN
INTRAMUSCULAR | Status: DC | PRN
  Administered 2018-09-11: 10 mL

## 2018-09-11 MED ORDER — KETOROLAC TROMETHAMINE 30 MG/ML IJ SOLN: 30.0000 mg | Freq: Once | INTRAMUSCULAR | Status: DC | PRN

## 2018-09-11 MED ORDER — PROPOFOL 10 MG/ML IV BOLUS
INTRAVENOUS | Status: AC
  Filled 2018-09-11: qty 20

## 2018-09-11 MED ORDER — MIDAZOLAM HCL 2 MG/2ML IJ SOLN
INTRAMUSCULAR | Status: AC
  Filled 2018-09-11: qty 2

## 2018-09-11 MED ORDER — HYDROMORPHONE HCL 1 MG/ML IJ SOLN: 0.2500 mg | INTRAMUSCULAR | Status: DC | PRN

## 2018-09-11 MED ORDER — MEPERIDINE HCL 50 MG/ML IJ SOLN: 6.2500 mg | INTRAMUSCULAR | Status: DC | PRN

## 2018-09-11 MED ORDER — HEPARIN SOD (PORK) LOCK FLUSH 100 UNIT/ML IV SOLN
INTRAVENOUS | Status: AC
  Filled 2018-09-11: qty 5

## 2018-09-11 MED ORDER — ONDANSETRON HCL 4 MG/2ML IJ SOLN: 4.0000 mg | Freq: Once | INTRAMUSCULAR | Status: DC | PRN

## 2018-09-11 MED ORDER — FENTANYL CITRATE (PF) 100 MCG/2ML IJ SOLN
INTRAMUSCULAR | Status: AC
  Filled 2018-09-11: qty 2

## 2018-09-11 MED ORDER — MIDAZOLAM HCL 5 MG/5ML IJ SOLN
INTRAMUSCULAR | Status: DC | PRN
  Administered 2018-09-11: 1 mg via INTRAVENOUS

## 2018-09-11 SURGICAL SUPPLY — 38 items
ADH SKN CLS APL DERMABOND .7 (GAUZE/BANDAGES/DRESSINGS) ×1
APPLIER CLIP 9.375 SM OPEN (CLIP)
APR CLP SM 9.3 20 MLT OPN (CLIP)
BAG DECANTER FOR FLEXI CONT (MISCELLANEOUS) ×3 IMPLANT
CHLORAPREP W/TINT 10.5 ML (MISCELLANEOUS) ×3 IMPLANT
CLIP APPLIE 9.375 SM OPEN (CLIP) IMPLANT
CLOTH BEACON ORANGE TIMEOUT ST (SAFETY) ×3 IMPLANT
COVER LIGHT HANDLE STERIS (MISCELLANEOUS) ×6 IMPLANT
DECANTER SPIKE VIAL GLASS SM (MISCELLANEOUS) ×3 IMPLANT
DERMABOND ADVANCED (GAUZE/BANDAGES/DRESSINGS) ×2
DERMABOND ADVANCED .7 DNX12 (GAUZE/BANDAGES/DRESSINGS) ×1 IMPLANT
DRAPE C-ARM FOLDED MOBILE STRL (DRAPES) ×3 IMPLANT
ELECT REM PT RETURN 9FT ADLT (ELECTROSURGICAL) ×3
ELECTRODE REM PT RTRN 9FT ADLT (ELECTROSURGICAL) ×1 IMPLANT
GLOVE BIO SURGEON STRL SZ 6.5 (GLOVE) ×2 IMPLANT
GLOVE BIO SURGEONS STRL SZ 6.5 (GLOVE) ×1
GLOVE BIOGEL PI IND STRL 6.5 (GLOVE) ×1 IMPLANT
GLOVE BIOGEL PI IND STRL 7.0 (GLOVE) ×1 IMPLANT
GLOVE BIOGEL PI INDICATOR 6.5 (GLOVE) ×2
GLOVE BIOGEL PI INDICATOR 7.0 (GLOVE) ×2
GOWN STRL REUS W/TWL LRG LVL3 (GOWN DISPOSABLE) ×6 IMPLANT
IV NS 500ML (IV SOLUTION) ×3
IV NS 500ML BAXH (IV SOLUTION) ×1 IMPLANT
KIT PORT POWER 8FR ISP MRI (Port) ×3 IMPLANT
KIT TURNOVER KIT A (KITS) ×3 IMPLANT
MANIFOLD NEPTUNE II (INSTRUMENTS) ×3 IMPLANT
NDL HYPO 25X1 1.5 SAFETY (NEEDLE) ×1 IMPLANT
NEEDLE HYPO 25X1 1.5 SAFETY (NEEDLE) ×3 IMPLANT
PACK MINOR (CUSTOM PROCEDURE TRAY) ×3 IMPLANT
PAD ARMBOARD 7.5X6 YLW CONV (MISCELLANEOUS) ×3 IMPLANT
SET BASIN LINEN APH (SET/KITS/TRAYS/PACK) ×3 IMPLANT
SHEATH COOK PEEL AWAY SET 8F (SHEATH) ×2 IMPLANT
SUT MNCRL AB 4-0 PS2 18 (SUTURE) ×3 IMPLANT
SUT PROLENE 2 0 SH 30 (SUTURE) ×3 IMPLANT
SUT VIC AB 3-0 SH 27 (SUTURE) ×3
SUT VIC AB 3-0 SH 27X BRD (SUTURE) ×1 IMPLANT
SYR 20CC LL (SYRINGE) ×3 IMPLANT
SYR CONTROL 10ML LL (SYRINGE) ×3 IMPLANT

## 2018-09-11 NOTE — Interval H&P Note (Signed)
History and Physical Interval Note:  09/11/2018 7:25 AM  [name]  has presented today for surgery, with the diagnosis of right lung cancer  The various methods of treatment have been discussed with the patient and family. After consideration of risks, benefits and other options for treatment, the patient has consented to  Procedure(s): INSERTION PORT-A-CATH (Left) as a surgical intervention .  The patient's history has been reviewed, patient examined, no change in status, stable for surgery.  I have reviewed the patient's chart and labs.  Questions were answered to the patient's satisfaction.    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   Virl Cagey

## 2018-09-11 NOTE — Transfer of Care (Signed)
Immediate Anesthesia Transfer of Care Note  Patient: [name]  Procedure(s) Performed: INSERTION PORT-A-CATH (Left Chest)  Patient Location: PACU  Anesthesia Type:MAC  Level of Consciousness: awake  Airway & Oxygen Therapy: Patient Spontanous Breathing  Post-op Assessment: Report given to RN  Post vital signs: Reviewed  Last Vitals:  Vitals Value Taken Time  BP 124/91 09/11/2018  8:32 AM  Temp    Pulse 83 09/11/2018  8:36 AM  Resp 24 09/11/2018  8:36 AM  SpO2 94 % 09/11/2018  8:36 AM  Vitals shown include unvalidated device data.  Last Pain:  Vitals:   09/11/18 0700  TempSrc: Oral  PainSc: 0-No pain      Patients Stated Pain Goal: 4 (16/60/63 0160)  Complications: none

## 2018-09-11 NOTE — Anesthesia Preprocedure Evaluation (Signed)
Anesthesia Evaluation  Patient identified by MRN, date of birth, ID band Patient awake    Reviewed: Allergy & Precautions, H&P , NPO status , Patient's Chart, lab work & pertinent test results  Airway Mallampati: II  TM Distance: >3 FB Neck ROM: full    Dental no notable dental hx. (+) Implants   Pulmonary COPD, former smoker,  Metastatic small cell lung CA   Pulmonary exam normal breath sounds clear to auscultation       Cardiovascular Exercise Tolerance: Good hypertension, negative cardio ROS   Rhythm:regular Rate:Normal     Neuro/Psych negative neurological ROS  negative psych ROS   GI/Hepatic negative GI ROS, Neg liver ROS,   Endo/Other  negative endocrine ROSdiabetes  Renal/GU negative Renal ROS  negative genitourinary   Musculoskeletal   Abdominal   Peds  Hematology negative hematology ROS (+)   Anesthesia Other Findings Fixed implants upper and lower  Reproductive/Obstetrics negative OB ROS                             Anesthesia Physical Anesthesia Plan  ASA: IV  Anesthesia Plan: MAC   Post-op Pain Management:    Induction:   PONV Risk Score and Plan:   Airway Management Planned:   Additional Equipment:   Intra-op Plan:   Post-operative Plan:   Informed Consent: I have reviewed the patients History and Physical, chart, labs and discussed the procedure including the risks, benefits and alternatives for the proposed anesthesia with the patient or authorized representative who has indicated his/her understanding and acceptance.     Dental Advisory Given  Plan Discussed with: CRNA  Anesthesia Plan Comments:         Anesthesia Quick Evaluation

## 2018-09-11 NOTE — Discharge Instructions (Signed)
Keep area clean and dry. You can take a shower in 24 hours. Do not submerge in water for 4 weeks. Take tylenol and ibuprofen for pain control and Norco for severe pain.   Implanted [facility] Guide An implanted port is a device that is placed under the skin. It is usually placed in the chest. The device can be used to give IV medicine, to take blood, or for dialysis. You may have an implanted port if:  You need IV medicine that would be irritating to the small veins in your hands or arms.  You need IV medicines, such as antibiotics, for a long period of time.  You need IV nutrition for a long period of time.  You need dialysis. Having a port means that your health care provider will not need to use the veins in your arms for these procedures. You may have fewer limitations when using a port than you would if you used other types of long-term IVs, and you will likely be able to return to normal activities after your incision heals. An implanted port has two main parts:  Reservoir. The reservoir is the part where a needle is inserted to give medicines or draw blood. The reservoir is round. After it is placed, it appears as a small, raised area under your skin.  Catheter. The catheter is a thin, flexible tube that connects the reservoir to a vein. Medicine that is inserted into the reservoir goes into the catheter and then into the vein. How is my port accessed? To access your port:  A numbing cream may be placed on the skin over the port site.  Your health care provider will put on a mask and sterile gloves.  The skin over your port will be cleaned carefully with a germ-killing soap and allowed to dry.  Your health care provider will gently pinch the port and insert a needle into it.  Your health care provider will check for a blood return to make sure the port is in the vein and is not clogged.  If your port needs to remain accessed to get medicine continuously (constant infusion),  your health care provider will place a clear bandage (dressing) over the needle site. The dressing and needle will need to be changed every week, or as told by your health care provider. What is flushing? Flushing helps keep the port from getting clogged. Follow instructions from your health care provider about how and when to flush the port. Ports are usually flushed with saline solution or a medicine called heparin. The need for flushing will depend on how the port is used:  If the port is only used from time to time to give medicines or draw blood, the port may need to be flushed: ? Before and after medicines have been given. ? Before and after blood has been drawn. ? As part of routine maintenance. Flushing may be recommended every 4-6 weeks.  If a constant infusion is running, the port may not need to be flushed.  Throw away any syringes in a disposal container that is meant for sharp items (sharps container). You can buy a sharps container from a pharmacy, or you can make one by using an empty hard plastic bottle with a cover. How long will my port stay implanted? The port can stay in for as long as your health care provider thinks it is needed. When it is time for the port to come out, a surgery will be done to  remove it. The surgery will be similar to the procedure that was done to put the port in. Follow these instructions at home:   Flush your port as told by your health care provider.  If you need an infusion over several days, follow instructions from your health care provider about how to take care of your port site. Make sure you: ? Wash your hands with soap and water before you change your dressing. If soap and water are not available, use alcohol-based hand sanitizer. ? Change your dressing as told by your health care provider. ? Place any used dressings or infusion bags into a plastic bag. Throw that bag in the trash. ? Keep the dressing that covers the needle clean and dry.  Do not get it wet. ? Do not use scissors or sharp objects near the tube. ? Keep the tube clamped, unless it is being used.  Check your port site every day for signs of infection. Check for: ? Redness, swelling, or pain. ? Fluid or blood. ? Pus or a bad smell.  Protect the skin around the port site. ? Avoid wearing bra straps that rub or irritate the site. ? Protect the skin around your port from seat belts. Place a soft pad over your chest if needed.  Bathe or shower as told by your health care provider. The site may get wet as long as you are not actively receiving an infusion.  Return to your normal activities as told by your health care provider. Ask your health care provider what activities are safe for you.  Carry a medical alert card or wear a medical alert bracelet at all times. This will let health care providers know that you have an implanted port in case of an emergency. Get help right away if:  You have redness, swelling, or pain at the port site.  You have fluid or blood coming from your port site.  You have pus or a bad smell coming from the port site.  You have a fever. Summary  Implanted ports are usually placed in the chest for long-term IV access.  Follow instructions from your health care provider about flushing the port and changing bandages (dressings).  Take care of the area around your port by avoiding clothing that puts pressure on the area, and by watching for signs of infection.  Protect the skin around your port from seat belts. Place a soft pad over your chest if needed.  Get help right away if you have a fever or you have redness, swelling, pain, drainage, or a bad smell at the port site. This information is not intended to replace advice given to you by your health care provider. Make sure you discuss any questions you have with your health care provider. Document Released: 07/24/2005 Document Revised: 08/26/2016 Document Reviewed:  08/26/2016 Elsevier Interactive Patient Education  2019 [place], Care After This sheet gives you information about how to care for yourself after your procedure. Your health care provider may also give you more specific instructions. If you have problems or questions, contact your health care provider. What can I expect after the procedure? After the procedure, it is common to have:  Discomfort at the port insertion site.  Bruising on the skin over the port. This should improve over 3-4 days. Follow these instructions at home: [facility] care  After your port is placed, you will get a manufacturer's information card. The card has information about your port. Keep this  card with you at all times.  Take care of the port as told by your health care provider. Ask your health care provider if you or a family member can get training for taking care of the port at home. A home health care nurse may also take care of the port.  Make sure to remember what type of port you have. Incision care      Follow instructions from your health care provider about how to take care of your port insertion site. Make sure you: ? Wash your hands with soap and water before and after you change your bandage (dressing). If soap and water are not available, use hand sanitizer. ? Change your dressing as told by your health care provider.  Leave stitches (sutures), skin glue, or adhesive strips in place. These skin closures may need to stay in place for 2 weeks or longer.   Check for: ? Redness, swelling, or pain. ? Fluid or blood. ? Warmth. ? Pus or a bad smell. Activity  Return to your normal activities as told by your health care provider. Ask your health care provider what activities are safe for you.  Do not lift anything that is heavier than 10 lb (4.5 kg) for about 1 week. General instructions  Take over-the-counter and prescription medicines only as told by your health  care provider.  Do not take baths, swim, or use a hot tub until your health care provider approves.   You may shower.  Do not drive for 24 hours if you were given a sedative during your procedure.  Wear a medical alert bracelet in case of an emergency. This will tell any health care providers that you have a port.  Keep all follow-up visits as told by your health care provider. This is important. Contact a health care provider if:  You cannot flush your port with saline as directed, or you cannot draw blood from the port.  You have a fever or chills.  You have redness, swelling, or pain around your port insertion site.  You have fluid or blood coming from your port insertion site.  Your port insertion site feels warm to the touch.  You have pus or a bad smell coming from the port insertion site. Get help right away if:  You have chest pain or shortness of breath.  You have bleeding from your port that you cannot control. Summary  Take care of the port as told by your health care provider. Keep the manufacturer's information card with you at all times.  Change your dressing as told by your health care provider.  Contact a health care provider if you have a fever or chills or if you have redness, swelling, or pain around your port insertion site.  Keep all follow-up visits as told by your health care provider. This information is not intended to replace advice given to you by your health care provider. Make sure you discuss any questions you have with your health care provider. Document Released: 05/14/2013 Document Revised: 02/19/2018 Document Reviewed: 02/19/2018 Elsevier Interactive Patient Education  2019 [place], Care After These instructions provide you with information about caring for yourself after your procedure. Your health care provider may also give you more specific instructions. Your treatment has been planned according  to current medical practices, but problems sometimes occur. Call your health care provider if you have any problems or questions after your procedure. What can I expect  after the procedure? After your procedure, you may:  Feel sleepy for several hours.  Feel clumsy and have poor balance for several hours.  Feel forgetful about what happened after the procedure.  Have poor judgment for several hours.  Feel nauseous or vomit.  Have a sore throat if you had a breathing tube during the procedure. Follow these instructions at home: For at least 24 hours after the procedure:      Have a responsible adult stay with you. It is important to have someone help care for you until you are awake and alert.  Rest as needed.  Do not: ? Participate in activities in which you could fall or become injured. ? Drive. ? Use heavy machinery. ? Drink alcohol. ? Take sleeping pills or medicines that cause drowsiness. ? Make important decisions or sign legal documents. ? Take care of children on your own. Eating and drinking  Follow the diet that is recommended by your health care provider.  If you vomit, drink water, juice, or soup when you can drink without vomiting.  Make sure you have little or no nausea before eating solid foods. General instructions  Take over-the-counter and prescription medicines only as told by your health care provider.  If you have sleep apnea, surgery and certain medicines can increase your risk for breathing problems. Follow instructions from your health care provider about wearing your sleep device: ? Anytime you are sleeping, including during daytime naps. ? While taking prescription pain medicines, sleeping medicines, or medicines that make you drowsy.  If you smoke, do not smoke without supervision.  Keep all follow-up visits as told by your health care provider. This is important. Contact a health care provider if:  You keep feeling nauseous or you keep  vomiting.  You feel light-headed.  You develop a rash.  You have a fever. Get help right away if:  You have trouble breathing. Summary  For several hours after your procedure, you may feel sleepy and have poor judgment.  Have a responsible adult stay with you for at least 24 hours or until you are awake and alert. This information is not intended to replace advice given to you by your health care provider. Make sure you discuss any questions you have with your health care provider. Document Released: 11/14/2015 Document Revised: 03/09/2017 Document Reviewed: 11/14/2015 Elsevier Interactive Patient Education  2019 [employer].

## 2018-09-11 NOTE — Anesthesia Postprocedure Evaluation (Signed)
Anesthesia Post Note  Patient: [name]  Procedure(s) Performed: INSERTION PORT-A-CATH (Left Chest)  Patient location during evaluation: PACU Anesthesia Type: MAC Level of consciousness: awake and alert and oriented Pain management: pain level controlled Vital Signs Assessment: post-procedure vital signs reviewed and stable Respiratory status: spontaneous breathing Cardiovascular status: blood pressure returned to baseline and stable Postop Assessment: no apparent nausea or vomiting Anesthetic complications: no     Last Vitals:  Vitals:   09/11/18 0900 09/11/18 0915  BP: 109/81 108/80  Pulse: 80 75  Resp: (!) 24 (!) 22  Temp:  36.6 C  SpO2: 92% 94%    Last Pain:  Vitals:   09/11/18 0915  TempSrc: Oral  PainSc: 0-No pain                 Kaiel Weide

## 2018-09-11 NOTE — Op Note (Signed)
Operative Note 09/11/18   Preoperative Diagnosis:  Metastatic lung cancer    Postoperative Diagnosis: Same   Procedure(s) Performed: Port-A-Cath placement, left subclavian    Surgeon: Lanell Matar. Constance Haw, MD   Assistants: No qualified resident was available   Anesthesia: Monitored anesthesia care   Anesthesiologist: Dr. Rick Duff   Specimens: None   Estimated Blood Loss: Minimal   Blood Replacement: None    Complications: None    Operative Findings: No obvious issue with SVC obstruction with wire placement   Indications: [name] is a 73 yo with metastatic lung cancer who is undergoing radiation treatment and will be starting chemotherapy.  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  We have opted to try a left sided port.    Procedure: The patient was brought into the operating room and monitored anesthesia care was induced.  One percent lidocaine was used for local anesthesia.   The left chest and neck was prepped and draped in the usual sterile fashion.  Preoperative antibiotics were given.  An incision was made below the left clavicle. A subcutaneous pocket was formed. The needles advanced into the left subclavian vein using the Seldinger technique without difficulty. A guidewire was then advanced into the right atrium under fluoroscopic guidance.  Ectopia was noted. An introducer and peel-away sheath were placed over the guidewire.  The introducer did not want to pass easily under the clavicle. A fluoro imagine was made and there was an odd curve to the introducer.  I was worried that this was creating a false passage, so the introducer and wire were removed.  The patient remained stable this entire time.  The needle was again advanced into the left subclavian using the  Seldinger technique without difficulty, and the guidewire was again advanced and was noted in the right atrium. The introducer and peel away sheath were then advanced again, and this time they advanced easily over the guidewire. The catheter was then inserted through the peel-away sheath and the peel-away sheath was removed.  A spot film was performed to confirm the position. The catheter was then attached to the port and the port placed in subcutaneous pocket. Adequate positioning was confirmed by fluoroscopy. Hemostasis was confirmed, and the port was secured with 2-0 prolene sutures.  Good backflow of blood was noted on aspiration of the port. The port was flushed with heparin flush. Subcutaneous layer was reapproximated using a 3-0 Vicryl interrupted suture. The skin was closed using a 4-0 Vicryl subcuticular suture. Dermabond was applied.  All tape and needle counts were correct at the end of the procedure. The patient was transferred to PACU in stable condition. A chest x-ray will be performed at that time.  John Labrum, MD [facility] [address] [place], [place] [address] [phone] (office)

## 2018-09-12 ENCOUNTER — Other Ambulatory Visit (HOSPITAL_COMMUNITY): Payer: Self-pay | Admitting: Hematology

## 2018-09-12 ENCOUNTER — Ambulatory Visit
Admission: RE | Admit: 2018-09-12 | Discharge: 2018-09-12 | Disposition: A | Discharge status: Home / Self Care | Payer: Medicare Other | Source: Ambulatory Visit | Attending: Radiation Oncology | Admitting: Radiation Oncology

## 2018-09-12 ENCOUNTER — Encounter (HOSPITAL_COMMUNITY): Payer: Self-pay | Admitting: General Surgery

## 2018-09-12 DIAGNOSIS — Z510 Encounter for antineoplastic radiation therapy: Secondary | ICD-10-CM | POA: Diagnosis not present

## 2018-09-13 ENCOUNTER — Encounter: Payer: Self-pay | Admitting: Radiation Oncology

## 2018-09-13 ENCOUNTER — Ambulatory Visit
Admission: RE | Admit: 2018-09-13 | Discharge: 2018-09-13 | Disposition: A | Discharge status: Home / Self Care | Payer: Medicare Other | Source: Ambulatory Visit | Attending: Radiation Oncology | Admitting: Radiation Oncology

## 2018-09-13 DIAGNOSIS — Z510 Encounter for antineoplastic radiation therapy: Secondary | ICD-10-CM | POA: Diagnosis not present

## 2018-09-16 ENCOUNTER — Other Ambulatory Visit: Payer: Self-pay

## 2018-09-16 ENCOUNTER — Other Ambulatory Visit (HOSPITAL_COMMUNITY): Payer: Medicare Other

## 2018-09-16 ENCOUNTER — Inpatient Hospital Stay (HOSPITAL_COMMUNITY): Payer: Medicare Other

## 2018-09-16 ENCOUNTER — Ambulatory Visit (HOSPITAL_COMMUNITY): Payer: Medicare Other

## 2018-09-16 ENCOUNTER — Inpatient Hospital Stay (HOSPITAL_BASED_OUTPATIENT_CLINIC_OR_DEPARTMENT_OTHER): Payer: Medicare Other | Admitting: Hematology

## 2018-09-16 ENCOUNTER — Encounter (HOSPITAL_COMMUNITY): Payer: Self-pay | Admitting: Hematology

## 2018-09-16 ENCOUNTER — Inpatient Hospital Stay (HOSPITAL_COMMUNITY): Payer: Medicare Other | Attending: Hematology

## 2018-09-16 VITALS — BP 96/63 | HR 75 | Temp 97.4°F | Resp 18

## 2018-09-16 DIAGNOSIS — J449 Chronic obstructive pulmonary disease, unspecified: Secondary | ICD-10-CM

## 2018-09-16 DIAGNOSIS — C7931 Secondary malignant neoplasm of brain: Secondary | ICD-10-CM

## 2018-09-16 DIAGNOSIS — C3491 Malignant neoplasm of unspecified part of right bronchus or lung: Secondary | ICD-10-CM | POA: Insufficient documentation

## 2018-09-16 DIAGNOSIS — I10 Essential (primary) hypertension: Secondary | ICD-10-CM | POA: Diagnosis not present

## 2018-09-16 DIAGNOSIS — R110 Nausea: Secondary | ICD-10-CM

## 2018-09-16 DIAGNOSIS — Z5112 Encounter for antineoplastic immunotherapy: Secondary | ICD-10-CM | POA: Insufficient documentation

## 2018-09-16 DIAGNOSIS — C771 Secondary and unspecified malignant neoplasm of intrathoracic lymph nodes: Secondary | ICD-10-CM

## 2018-09-16 DIAGNOSIS — Z5111 Encounter for antineoplastic chemotherapy: Secondary | ICD-10-CM | POA: Diagnosis present

## 2018-09-16 DIAGNOSIS — Z7901 Long term (current) use of anticoagulants: Secondary | ICD-10-CM | POA: Diagnosis not present

## 2018-09-16 DIAGNOSIS — I2699 Other pulmonary embolism without acute cor pulmonale: Secondary | ICD-10-CM | POA: Diagnosis not present

## 2018-09-16 DIAGNOSIS — Z5189 Encounter for other specified aftercare: Secondary | ICD-10-CM | POA: Diagnosis not present

## 2018-09-16 DIAGNOSIS — E119 Type 2 diabetes mellitus without complications: Secondary | ICD-10-CM | POA: Insufficient documentation

## 2018-09-16 LAB — CBC WITH DIFFERENTIAL/PLATELET
Abs Immature Granulocytes: 0.17 10*3/uL — ABNORMAL HIGH (ref 0.00–0.07)
Basophils Absolute: 0.1 10*3/uL (ref 0.0–0.1)
Basophils Relative: 1 %
Eosinophils Absolute: 0.1 10*3/uL (ref 0.0–0.5)
Eosinophils Relative: 0 %
HCT: 41.8 % (ref 39.0–52.0)
Hemoglobin: 13.5 g/dL (ref 13.0–17.0)
Immature Granulocytes: 2 %
Lymphocytes Relative: 5 %
Lymphs Abs: 0.6 10*3/uL — ABNORMAL LOW (ref 0.7–4.0)
MCH: 30.7 pg (ref 26.0–34.0)
MCHC: 32.3 g/dL (ref 30.0–36.0)
MCV: 95 fL (ref 80.0–100.0)
Monocytes Absolute: 0.9 10*3/uL (ref 0.1–1.0)
Monocytes Relative: 8 %
Neutro Abs: 9.9 10*3/uL — ABNORMAL HIGH (ref 1.7–7.7)
Neutrophils Relative %: 84 %
Platelets: 346 10*3/uL (ref 150–400)
RBC: 4.4 MIL/uL (ref 4.22–5.81)
RDW: 14.2 % (ref 11.5–15.5)
WBC: 11.7 10*3/uL — ABNORMAL HIGH (ref 4.0–10.5)
nRBC: 0 % (ref 0.0–0.2)

## 2018-09-16 LAB — COMPREHENSIVE METABOLIC PANEL WITH GFR
ALT: 26 U/L (ref 0–44)
AST: 22 U/L (ref 15–41)
Albumin: 3.7 g/dL (ref 3.5–5.0)
Alkaline Phosphatase: 95 U/L (ref 38–126)
Anion gap: 10 (ref 5–15)
BUN: 13 mg/dL (ref 8–23)
CO2: 27 mmol/L (ref 22–32)
Calcium: 9.4 mg/dL (ref 8.9–10.3)
Chloride: 99 mmol/L (ref 98–111)
Creatinine, Ser: 0.97 mg/dL (ref 0.61–1.24)
GFR calc Af Amer: >60 mL/min
GFR calc non Af Amer: >60 mL/min
Glucose, Bld: 144 mg/dL — ABNORMAL HIGH (ref 70–99)
Potassium: 4.9 mmol/L (ref 3.5–5.1)
Sodium: 136 mmol/L (ref 135–145)
Total Bilirubin: 0.5 mg/dL (ref 0.3–1.2)
Total Protein: 7.1 g/dL (ref 6.5–8.1)

## 2018-09-16 MED ORDER — SODIUM CHLORIDE 0.9 % IV SOLN
200.0000 mg | Freq: Once | INTRAVENOUS | Status: AC
  Administered 2018-09-16: 200 mg via INTRAVENOUS
  Filled 2018-09-16: qty 10

## 2018-09-16 MED ORDER — PALONOSETRON HCL INJECTION 0.25 MG/5ML
0.2500 mg | Freq: Once | INTRAVENOUS | Status: AC
  Administered 2018-09-16: 0.25 mg via INTRAVENOUS
  Filled 2018-09-16: qty 5

## 2018-09-16 MED ORDER — SODIUM CHLORIDE 0.9 % IV SOLN
1500.0000 mg | Freq: Once | INTRAVENOUS | Status: AC
  Administered 2018-09-16: 1500 mg via INTRAVENOUS
  Filled 2018-09-16: qty 30

## 2018-09-16 MED ORDER — SODIUM CHLORIDE 0.9 % IV SOLN
564.0000 mg | Freq: Once | INTRAVENOUS | Status: AC
  Administered 2018-09-16: 560 mg via INTRAVENOUS
  Filled 2018-09-16: qty 56

## 2018-09-16 MED ORDER — SODIUM CHLORIDE 0.9% FLUSH
10.0000 mL | Freq: Once | INTRAVENOUS | Status: AC
  Administered 2018-09-16: 10 mL

## 2018-09-16 MED ORDER — SODIUM CHLORIDE 0.9 % IV SOLN
Freq: Once | INTRAVENOUS | Status: AC
  Administered 2018-09-16: 10:00:00 via INTRAVENOUS
  Filled 2018-09-16: qty 5

## 2018-09-16 MED ORDER — HEPARIN SOD (PORK) LOCK FLUSH 100 UNIT/ML IV SOLN
500.0000 [IU] | Freq: Once | INTRAVENOUS | Status: AC
  Administered 2018-09-16: 500 [IU] via INTRAVENOUS

## 2018-09-16 MED ORDER — SODIUM CHLORIDE 0.9 % IV SOLN
Freq: Once | INTRAVENOUS | Status: AC
  Administered 2018-09-16: 10:00:00 via INTRAVENOUS

## 2018-09-16 MED ORDER — SCOPOLAMINE 1 MG/3DAYS TD PT72
1.0000 | MEDICATED_PATCH | TRANSDERMAL | Status: DC
  Administered 2018-09-16: 1.5 mg via TRANSDERMAL
  Filled 2018-09-16: qty 1

## 2018-09-16 NOTE — Progress Notes (Signed)
Per RLockamy NP. Proceed with treatment. New order received to use a Scopolamine patch today. VSS. MAR reviewed.   Treatment given today per MD orders. Tolerated infusion without adverse affects. Vital signs stable. No complaints at this time. Discharged from clinic ambulatory. F/U with [facility] as scheduled.

## 2018-09-16 NOTE — Patient Instructions (Signed)
[facility] Discharge Instructions     Thank you for choosing [facility] to provide your oncology and hematology care.  To afford each patient quality time with our provider, please arrive at least 15 minutes before your scheduled appointment time.   If you have a lab appointment with the Cancer Center please come in thru the  Main Entrance and check in at the main information desk  You need to re-schedule your appointment should you arrive 10 or more minutes late.  We strive to give you quality time with our providers, and arriving late affects you and other patients whose appointments are after yours.  Also, if you no show three or more times for appointments you may be dismissed from the clinic at the providers discretion.     Again, thank you for choosing [facility].  Our hope is that these requests will decrease the amount of time that you wait before being seen by our physicians.       _____________________________________________________________  Should you have questions after your visit to [facility], please contact our office at [phone] between the hours of 8:00 a.m. and 4:30 p.m.  Voicemails left after 4:00 p.m. will not be returned until the following business day.  For prescription refill requests, have your pharmacy contact our office and allow 72 hours.    Cancer Center Support Programs:   > Cancer Support Group  2nd Tuesday of the month 1pm-2pm, Journey Room    

## 2018-09-16 NOTE — Progress Notes (Signed)
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[place])   08/22/2018 Initial Diagnosis    Small cell carcinoma of lung, right ([place])    08/23/2018 -  Chemotherapy    The patient had palonosetron (ALOXI) injection 0.25 mg, 0.25 mg, Intravenous,  Once, 1 of 4 cycles Administration: 0.25 mg (08/23/2018) pegfilgrastim-cbqv (UDENYCA) injection 6 mg, 6 mg, Subcutaneous, Once, 1 of 4 cycles Administration: 6 mg (08/27/2018) CARBOplatin (PARAPLATIN) 560 mg in sodium chloride 0.9 % 250 mL chemo infusion, 560 mg (100 % of original dose 564 mg), Intravenous,  Once, 1 of 4 cycles Dose modification:   (original dose 564 mg, Cycle 1) Administration: 560 mg (08/23/2018) etoposide (VEPESID) 220 mg in sodium chloride 0.9 % 1,000 mL chemo infusion, 100 mg/m2 = 220 mg, Intravenous,  Once, 1 of 4 cycles Administration: 220 mg (08/23/2018) fosaprepitant (EMEND) 150 mg, dexamethasone (DECADRON) 12 mg in sodium chloride 0.9 % 145 mL IVPB, , Intravenous,  Once, 1 of 4 cycles Administration:  (08/23/2018) durvalumab (IMFINZI) 1,500 mg in sodium chloride 0.9 % 100 mL chemo infusion, 1,500 mg (100 % of original dose 1,500 mg), Intravenous,  Once, 0 of 3 cycles Dose modification: 1,500 mg (original dose 1,500 mg, Cycle 2, Reason: Provider Judgment)  for chemotherapy treatment.      Small cell carcinoma of lung, right ([place])   08/23/2018 -  Chemotherapy    The patient had palonosetron (ALOXI) injection 0.25 mg, 0.25 mg, Intravenous,  Once, 1 of 4 cycles Administration: 0.25 mg (08/23/2018) pegfilgrastim-cbqv (UDENYCA)  injection 6 mg, 6 mg, Subcutaneous, Once, 1 of 4 cycles Administration: 6 mg (08/27/2018) CARBOplatin (PARAPLATIN) 560 mg in sodium chloride 0.9 % 250 mL chemo infusion, 560 mg (100 % of original dose 564 mg), Intravenous,  Once, 1 of 4 cycles Dose modification:   (original dose 564 mg, Cycle 1) Administration: 560 mg (08/23/2018) etoposide (VEPESID) 220 mg in sodium chloride 0.9 % 1,000 mL chemo infusion, 100 mg/m2 = 220 mg, Intravenous,  Once, 1 of 4 cycles Administration: 220 mg (08/23/2018) fosaprepitant (EMEND) 150 mg, dexamethasone (DECADRON) 12 mg in sodium chloride 0.9 % 145 mL IVPB, , Intravenous,  Once, 1 of 4 cycles Administration:  (08/23/2018) durvalumab (IMFINZI) 1,500 mg in sodium chloride 0.9 % 100 mL chemo infusion, 1,500 mg (100 % of original dose 1,500 mg), Intravenous,  Once, 0 of 3 cycles Dose modification: 1,500 mg (original dose 1,500 mg, Cycle 2, Reason: Provider Judgment)  for chemotherapy treatment.     08/26/2018 Initial Diagnosis    Small cell carcinoma of lung, right [facility])      INTERVAL HISTORY:  [name] 73 y.o. 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 He is here today with family. He reports having nausea but no vomiting. He has no taste and foods do not smell good for him. It is causing his appetite to be decreased. He also has fatigue that seems to be increased since his last visit. Denies any vomiting or diarrhea. Denies any new pains. Had not noticed any recent bleeding such as epistaxis, hematuria or hematochezia. Denies recent chest  pain on exertion, shortness of breath on minimal exertion, pre-syncopal episodes, or palpitations. Denies any numbness or tingling in hands or feet. Denies any recent fevers, infections, or recent hospitalizations. Patient reports appetite at 0% and energy level at 25%. He has lost 5 pounds since his last visit.    REVIEW OF SYSTEMS:  Review of Systems  Constitutional:  Positive for fatigue.  Gastrointestinal: Positive for constipation and nausea.  All other systems reviewed and are negative.    PAST MEDICAL/SURGICAL HISTORY:  Past Medical History:  Diagnosis Date  . Actinic keratosis   . Arthritis   . Colitis MAY 2012    CT ABD/PELVIS HEP FLEXURE  . COPD (chronic obstructive pulmonary disease) ([place])   . Diabetes mellitus without complication ([place])   . History of kidney stones   . Hyperlipemia   . Hypertension   . NSAID long-term use NAPROXEN FOR OA  . SCL Ca dx'd 08/2018   Past Surgical History:  Procedure Laterality Date  . BACK SURGERY     spinal stenosis  . BASAL CELL CARCINOMA EXCISION  FACE, arms feet, leg  . CHOLECYSTECTOMY  JUNE 2011 MJ   STONES, PANCREATITIS  . KNEE ARTHROSCOPY WITH MEDIAL MENISECTOMY Right 03/24/2015   Procedure: KNEE ARTHROSCOPY WITH MEDIAL MENISECTOMY;  Surgeon: Carole Civil, MD;  Location: [facility];  Service: Orthopedics;  Laterality: Right;  . KNEE SURGERY Left LEFT   arthroscopy  . LITHOTRIPSY  80s  . PORTACATH PLACEMENT Left 09/11/2018   Procedure: INSERTION PORT-A-CATH;  Surgeon: Virl Cagey, MD;  Location: [facility];  Service: General;  Laterality: Left;  . SPINE SURGERY       SOCIAL HISTORY:  Social History   Socioeconomic History  . Marital status: Divorced    Spouse name: Not on file  . Number of children: 0  . Years of education: Not on file  . Highest education level: Not on file  Occupational History  . Occupation: [place] [employer]   . Occupation: Manufacturing engineer   Social Needs  . Financial resource strain: Not hard at all  . Food insecurity:    Worry: Never true    Inability: Never true  . Transportation needs:    Medical: No    Non-medical: No  Tobacco Use  . Smoking status: Former Smoker    Packs/day: 1.00    Years: 50.00    Pack years: 50.00    Types: Cigarettes    Last attempt to quit: 08/07/2018    Years since quitting: 0.1  . Smokeless tobacco: Never Used  Substance and  Sexual Activity  . Alcohol use: Yes    Comment: once a month  . Drug use: No  . Sexual activity: Never    Birth control/protection: None  Lifestyle  . Physical activity:    Days per week: 0 days    Minutes per session: 0 min  . Stress: Not at all  Relationships  . Social connections:    Talks on phone: Never    Gets together: More than three times a week    Attends religious service: Never    Active member of club or organization: Yes    Attends meetings of clubs or organizations: More than 4 times per year    Relationship status: Divorced  . Intimate partner violence:    Fear of current or ex partner: No    Emotionally abused: No    Physically abused: No    Forced sexual activity: No  Other Topics Concern  .  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    FAMILY HISTORY:  Family History  Problem Relation Age of Onset  . Cancer Mother        lung  . Cancer Father        lung and liver  . Colon polyps Neg Hx   . Colon cancer Neg Hx     CURRENT MEDICATIONS:  Outpatient Encounter Medications as of 09/16/2018  Medication Sig  . albuterol (PROVENTIL HFA;VENTOLIN HFA) 108 (90 Base) MCG/ACT inhaler INHALE 2 PUFFS INTO THE LUNGS QID  . aspirin 81 MG tablet Take 81 mg by mouth daily.    Marland Kitchen CARBOPLATIN IV Inject into the vein every 21 ( twenty-one) days.  . Cholecalciferol 50 MCG (2000 UT) TBDP Take 1 tablet by mouth daily.  [place] Oris (IMFINZI IV) Inject into the vein every 21 ( twenty-one) days.  . ETOPOSIDE IV Inject into the vein every 21 ( twenty-one) days.  Marland Kitchen HYDROcodone-acetaminophen (NORCO) 5-325 MG tablet Take 1 tablet by mouth every 4 (four) hours as needed for moderate pain.  . metFORMIN (GLUCOPHAGE) 500 MG tablet Take 500 mg by mouth 2 (two) times daily with a meal.  . Multiple Vitamin (MULTIVITAMIN) tablet Take 1 tablet by mouth daily.  . ondansetron (ZOFRAN) 8 MG  tablet Take 1 tablet (8 mg total) by mouth every 8 (eight) hours as needed for nausea or vomiting.  . rosuvastatin (CRESTOR) 10 MG tablet Take 10 mg by mouth daily.    . sucralfate (CARAFATE) 1 g tablet Take 1 tablet (1 g total) by mouth 4 (four) times daily.  Marland Kitchen telmisartan-hydrochlorothiazide (MICARDIS HCT) 40-12.5 MG per tablet Take 1 tablet by mouth daily.    . verapamil (COVERA HS) 240 MG (CO) 24 hr tablet Take 240 mg by mouth at bedtime.     No facility-administered encounter medications on file as of 09/16/2018.     ALLERGIES:  No Known Allergies   PHYSICAL EXAM:  ECOG Performance status: 1  Vitals:   09/16/18 0851  BP: 107/67  Pulse: 87  Resp: 18  Temp: 97.6 F (36.4 C)  SpO2: 97%   Filed Weights   09/16/18 0851  Weight: 188 lb 12.8 oz (85.6 kg)    Physical Exam Constitutional:      Appearance: Normal appearance. He is normal weight.  Cardiovascular:     Rate and Rhythm: Normal rate and regular rhythm.     Heart sounds: Normal heart sounds.  Pulmonary:     Effort: Pulmonary effort is normal.     Breath sounds: Normal breath sounds.  Musculoskeletal: Normal range of motion.  Skin:    General: Skin is warm and dry.  Neurological:     Mental Status: He is alert and oriented to person, place, and time. Mental status is at baseline.  Psychiatric:        Mood and Affect: Mood normal.        Behavior: Behavior normal.        Thought Content: Thought content normal.        Judgment: Judgment normal.    Port site looks well-healed.  LABORATORY DATA:  I have reviewed the labs as listed.  CBC    Component Value Date/Time   WBC 11.7 (H) 09/16/2018 0834   RBC 4.40 09/16/2018 0834   HGB 13.5 09/16/2018 0834   HCT 41.8 09/16/2018 0834  PLT 346 09/16/2018 0834   MCV 95.0 09/16/2018 0834   MCH 30.7 09/16/2018 0834   MCHC 32.3 09/16/2018 0834   RDW 14.2 09/16/2018 0834   LYMPHSABS 0.6 (L) 09/16/2018 0834   MONOABS 0.9 09/16/2018 0834   EOSABS 0.1 09/16/2018  0834   BASOSABS 0.1 09/16/2018 0834   CMP Latest Ref Rng & Units 09/16/2018 08/26/2018 08/25/2018  Glucose 70 - 99 mg/dL 144(H) 117(H) 161(H)  BUN 8 - 23 mg/dL 13 20 20   Creatinine 0.61 - 1.24 mg/dL 0.97 0.61 0.68  Sodium 135 - 145 mmol/L 136 138 138  Potassium 3.5 - 5.1 mmol/L 4.9 4.6 5.2(H)  Chloride 98 - 111 mmol/L 99 104 105  CO2 22 - 32 mmol/L 27 28 28   Calcium 8.9 - 10.3 mg/dL 9.4 8.5(L) 8.5(L)  Total Protein 6.5 - 8.1 g/dL 7.1 5.6(L) 5.8(L)  Total Bilirubin 0.3 - 1.2 mg/dL 0.5 0.9 0.6  Alkaline Phos 38 - 126 U/L 95 58 63  AST 15 - 41 U/L 22 19 21   ALT 0 - 44 U/L 26 27 33       DIAGNOSTIC IMAGING:  I have independently reviewed the scans and discussed with the patient.   I have reviewed John Finders, NP's note and agree with the documentation.  I personally performed a face-to-face visit, made revisions and my assessment and plan is as follows.    ASSESSMENT & PLAN:   Small cell carcinoma of lung, right ([place]) 1.  Extensive stage small cell lung cancer: -Patient noted bilateral axillary adenopathy starting in October 2019. - He was evaluated at [facility] with a biopsy of the axillary lymph node.  He was also diagnosed with small pulmonary embolism and was started on Eliquis. - On 08/21/2018, he presented to the ER with shortness of breath.  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 -He was admitted to [facility] long and was treated with first cycle of chemotherapy with carboplatin and VP-16 on 08/23/2018 along with radiation to the chest.  - MRI of the brain on 08/22/2018 shows 3 subcentimeter lesions within the brain, present in the right insula, right occipital lobe and right cerebellar hemisphere. -He tolerated first cycle very well without any major side effects. - WBRT from 09/02/2018 through 09/13/2018. -He is experiencing more nausea since brain radiation.  This is mild and denies any vomiting.  I have suggested him to go on scopolamine patch and see if it helps.  We will  place 1 patch today. - He had port placed.  I reviewed chest x-ray. -We talked about starting him on durvalumab along with his chemotherapy.  We talked about immunotherapy related side effects in detail. -He may proceed with cycle 2 today.  I will see him back in 3 weeks for follow-up.  I will plan to repeat scans after cycle 3.  2.  Brain metastasis: - MRI of the brain on 08/22/2018 showed 3 small subcentimeter metastasis in the right insula, right occipital lobe and right cerebellar hemisphere. - WBRT from 09/02/2018 through 09/13/2018.      Orders placed this encounter:  No orders of the defined types were placed in this encounter.     Derek Jack, MD [place] [phone]

## 2018-09-16 NOTE — Assessment & Plan Note (Signed)
1.  Extensive stage small cell lung cancer: -Patient noted bilateral axillary adenopathy starting in October 2019. - He was evaluated at [facility] with a biopsy of the axillary lymph node.  He was also diagnosed with small pulmonary embolism and was started on Eliquis. - On 08/21/2018, he presented to the ER with shortness of breath.  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 -He was admitted to [facility] long and was treated with first cycle of chemotherapy with carboplatin and VP-16 on 08/23/2018 along with radiation to the chest.  - MRI of the brain on 08/22/2018 shows 3 subcentimeter lesions within the brain, present in the right insula, right occipital lobe and right cerebellar hemisphere. -He tolerated first cycle very well without any major side effects. - WBRT from 09/02/2018 through 09/13/2018. -He is experiencing more nausea since brain radiation.  This is mild and denies any vomiting.  I have suggested him to go on scopolamine patch and see if it helps.  We will place 1 patch today. - He had port placed.  I reviewed chest x-ray. -We talked about starting him on durvalumab along with his chemotherapy.  We talked about immunotherapy related side effects in detail. -He may proceed with cycle 2 today.  I will see him back in 3 weeks for follow-up.  I will plan to repeat scans after cycle 3.  2.  Brain metastasis: - MRI of the brain on 08/22/2018 showed 3 small subcentimeter metastasis in the right insula, right occipital lobe and right cerebellar hemisphere. - WBRT from 09/02/2018 through 09/13/2018.

## 2018-09-17 ENCOUNTER — Inpatient Hospital Stay (HOSPITAL_COMMUNITY): Payer: Medicare Other

## 2018-09-17 VITALS — BP 104/59 | HR 74 | Temp 98.1°F | Resp 18

## 2018-09-17 DIAGNOSIS — C3491 Malignant neoplasm of unspecified part of right bronchus or lung: Secondary | ICD-10-CM

## 2018-09-17 DIAGNOSIS — C771 Secondary and unspecified malignant neoplasm of intrathoracic lymph nodes: Secondary | ICD-10-CM

## 2018-09-17 DIAGNOSIS — Z5112 Encounter for antineoplastic immunotherapy: Secondary | ICD-10-CM | POA: Diagnosis not present

## 2018-09-17 MED ORDER — SODIUM CHLORIDE 0.9 % IV SOLN
200.0000 mg | Freq: Once | INTRAVENOUS | Status: AC
  Administered 2018-09-17: 200 mg via INTRAVENOUS
  Filled 2018-09-17: qty 10

## 2018-09-17 MED ORDER — HEPARIN SOD (PORK) LOCK FLUSH 100 UNIT/ML IV SOLN
INTRAVENOUS | Status: AC
  Filled 2018-09-17: qty 5

## 2018-09-17 MED ORDER — SODIUM CHLORIDE 0.9% FLUSH
10.0000 mL | INTRAVENOUS | Status: DC | PRN
  Administered 2018-09-17 (×2): 10 mL
  Filled 2018-09-17 (×2): qty 10

## 2018-09-17 MED ORDER — SODIUM CHLORIDE 0.9 % IV SOLN
Freq: Once | INTRAVENOUS | Status: AC
  Administered 2018-09-17: 14:00:00 via INTRAVENOUS

## 2018-09-17 MED ORDER — HEPARIN SOD (PORK) LOCK FLUSH 100 UNIT/ML IV SOLN
500.0000 [IU] | Freq: Once | INTRAVENOUS | Status: AC | PRN
  Administered 2018-09-17: 500 [IU]

## 2018-09-17 MED ORDER — SODIUM CHLORIDE 0.9 % IV SOLN
10.0000 mg | Freq: Once | INTRAVENOUS | Status: AC
  Administered 2018-09-17: 10 mg via INTRAVENOUS
  Filled 2018-09-17: qty 10

## 2018-09-17 NOTE — Progress Notes (Signed)
Pt presents today for Etoposide day 2. VSS. Pt has no complaints of any changes since last visit. Pt has no complaints of pain.   Treatment given today per MD orders. Tolerated infusion without adverse affects. Vital signs stable. No complaints at this time. Discharged from clinic ambulatory. F/U with [facility] as scheduled.

## 2018-09-17 NOTE — Patient Instructions (Signed)
[facility] Discharge Instructions for Patients Receiving Chemotherapy  Today you received the following chemotherapy agents   To help prevent nausea and vomiting after your treatment, we encourage you to take your nausea medication   If you develop nausea and vomiting that is not controlled by your nausea medication, call the clinic.   BELOW ARE SYMPTOMS THAT SHOULD BE REPORTED IMMEDIATELY:  *FEVER GREATER THAN 100.5 F  *CHILLS WITH OR WITHOUT FEVER  NAUSEA AND VOMITING THAT IS NOT CONTROLLED WITH YOUR NAUSEA MEDICATION  *UNUSUAL SHORTNESS OF BREATH  *UNUSUAL BRUISING OR BLEEDING  TENDERNESS IN MOUTH AND THROAT WITH OR WITHOUT PRESENCE OF ULCERS  *URINARY PROBLEMS  *BOWEL PROBLEMS  UNUSUAL RASH Items with * indicate a potential emergency and should be followed up as soon as possible.  Feel free to call the clinic should you have any questions or concerns. The clinic phone number is [phone].  Please show the CHEMO ALERT CARD at check-in to the Emergency Department and triage nurse.   

## 2018-09-18 ENCOUNTER — Inpatient Hospital Stay (HOSPITAL_COMMUNITY): Payer: Medicare Other

## 2018-09-18 ENCOUNTER — Encounter (HOSPITAL_COMMUNITY): Payer: Self-pay

## 2018-09-18 VITALS — BP 112/64 | HR 68 | Temp 97.7°F | Resp 18

## 2018-09-18 DIAGNOSIS — Z5112 Encounter for antineoplastic immunotherapy: Secondary | ICD-10-CM | POA: Diagnosis not present

## 2018-09-18 DIAGNOSIS — C3491 Malignant neoplasm of unspecified part of right bronchus or lung: Secondary | ICD-10-CM

## 2018-09-18 DIAGNOSIS — C771 Secondary and unspecified malignant neoplasm of intrathoracic lymph nodes: Secondary | ICD-10-CM

## 2018-09-18 MED ORDER — SODIUM CHLORIDE 0.9 % IV SOLN
Freq: Once | INTRAVENOUS | Status: AC
  Administered 2018-09-18: 13:00:00 via INTRAVENOUS

## 2018-09-18 MED ORDER — SODIUM CHLORIDE 0.9 % IV SOLN
10.0000 mg | Freq: Once | INTRAVENOUS | Status: AC
  Administered 2018-09-18: 10 mg via INTRAVENOUS
  Filled 2018-09-18: qty 10

## 2018-09-18 MED ORDER — HEPARIN SOD (PORK) LOCK FLUSH 100 UNIT/ML IV SOLN
500.0000 [IU] | Freq: Once | INTRAVENOUS | Status: AC | PRN
  Administered 2018-09-18: 500 [IU]
  Filled 2018-09-18: qty 5

## 2018-09-18 MED ORDER — SODIUM CHLORIDE 0.9% FLUSH
10.0000 mL | INTRAVENOUS | Status: DC | PRN
  Administered 2018-09-18: 10 mL
  Filled 2018-09-18: qty 10

## 2018-09-18 MED ORDER — SODIUM CHLORIDE 0.9 % IV SOLN
200.0000 mg | Freq: Once | INTRAVENOUS | Status: AC
  Administered 2018-09-18: 200 mg via INTRAVENOUS
  Filled 2018-09-18: qty 10

## 2018-09-18 NOTE — Progress Notes (Signed)
Patient tolerated treatment with no complaints voiced.  Port site clean and dry with no bruising or swelling noted at site.  Good blood return noted before and after treatment.  Band aid applied.  VSs with discharge and left ambulatory with no s/s of distress noted.

## 2018-09-18 NOTE — Patient Instructions (Signed)
Cancer Center Discharge Instructions for Patients Receiving Chemotherapy  Today you received the following chemotherapy agents  If you develop nausea and vomiting that is not controlled by your nausea medication, call the clinic.   BELOW ARE SYMPTOMS THAT SHOULD BE REPORTED IMMEDIATELY:  *FEVER GREATER THAN 100.5 F  *CHILLS WITH OR WITHOUT FEVER  NAUSEA AND VOMITING THAT IS NOT CONTROLLED WITH YOUR NAUSEA MEDICATION  *UNUSUAL SHORTNESS OF BREATH  *UNUSUAL BRUISING OR BLEEDING  TENDERNESS IN MOUTH AND THROAT WITH OR WITHOUT PRESENCE OF ULCERS  *URINARY PROBLEMS  *BOWEL PROBLEMS  UNUSUAL RASH Items with * indicate a potential emergency and should be followed up as soon as possible.  Feel free to call the clinic should you have any questions or concerns. The clinic phone number is [phone].  Please show the CHEMO ALERT CARD at check-in to the Emergency Department and triage nurse.   

## 2018-09-20 ENCOUNTER — Other Ambulatory Visit (HOSPITAL_COMMUNITY): Payer: Self-pay | Admitting: Nurse Practitioner

## 2018-09-20 ENCOUNTER — Encounter (HOSPITAL_COMMUNITY): Payer: Self-pay

## 2018-09-20 ENCOUNTER — Inpatient Hospital Stay (HOSPITAL_COMMUNITY): Payer: Medicare Other

## 2018-09-20 VITALS — BP 98/67 | HR 72 | Temp 97.7°F | Resp 18 | Wt 189.5 lb

## 2018-09-20 DIAGNOSIS — Z5112 Encounter for antineoplastic immunotherapy: Secondary | ICD-10-CM | POA: Diagnosis not present

## 2018-09-20 DIAGNOSIS — R110 Nausea: Secondary | ICD-10-CM

## 2018-09-20 DIAGNOSIS — C771 Secondary and unspecified malignant neoplasm of intrathoracic lymph nodes: Secondary | ICD-10-CM

## 2018-09-20 DIAGNOSIS — C3491 Malignant neoplasm of unspecified part of right bronchus or lung: Secondary | ICD-10-CM

## 2018-09-20 MED ORDER — PEGFILGRASTIM-CBQV 6 MG/0.6ML ~~LOC~~ SOSY
6.0000 mg | PREFILLED_SYRINGE | Freq: Once | SUBCUTANEOUS | Status: AC
  Administered 2018-09-20: 6 mg via SUBCUTANEOUS

## 2018-09-20 MED ORDER — SCOPOLAMINE 1 MG/3DAYS TD PT72: 1.0000 | MEDICATED_PATCH | TRANSDERMAL | 12 refills | Status: DC

## 2018-09-20 MED ORDER — PEGFILGRASTIM-CBQV 6 MG/0.6ML ~~LOC~~ SOSY
PREFILLED_SYRINGE | SUBCUTANEOUS | Status: AC
  Filled 2018-09-20: qty 0.6

## 2018-09-20 NOTE — Progress Notes (Signed)
Pt here today for Udenyca injection. Pt given injection in lower left abdomen. Pt tolerated injection with no complaints. Pt stable and discharged home ambulatory. Pt to return as scheduled for follow up, treatment and injection.

## 2018-10-07 ENCOUNTER — Inpatient Hospital Stay (HOSPITAL_COMMUNITY): Payer: Medicare Other | Attending: Internal Medicine

## 2018-10-07 ENCOUNTER — Encounter (HOSPITAL_COMMUNITY): Payer: Self-pay

## 2018-10-07 ENCOUNTER — Inpatient Hospital Stay (HOSPITAL_COMMUNITY): Payer: Medicare Other

## 2018-10-07 ENCOUNTER — Other Ambulatory Visit (HOSPITAL_COMMUNITY): Payer: Self-pay | Admitting: Internal Medicine

## 2018-10-07 VITALS — BP 101/69 | HR 80 | Temp 97.7°F | Resp 16 | Wt 183.8 lb

## 2018-10-07 DIAGNOSIS — Z7901 Long term (current) use of anticoagulants: Secondary | ICD-10-CM | POA: Diagnosis not present

## 2018-10-07 DIAGNOSIS — C771 Secondary and unspecified malignant neoplasm of intrathoracic lymph nodes: Secondary | ICD-10-CM | POA: Diagnosis not present

## 2018-10-07 DIAGNOSIS — C3491 Malignant neoplasm of unspecified part of right bronchus or lung: Secondary | ICD-10-CM | POA: Insufficient documentation

## 2018-10-07 DIAGNOSIS — I10 Essential (primary) hypertension: Secondary | ICD-10-CM | POA: Diagnosis not present

## 2018-10-07 DIAGNOSIS — Z5189 Encounter for other specified aftercare: Secondary | ICD-10-CM | POA: Diagnosis not present

## 2018-10-07 DIAGNOSIS — C7931 Secondary malignant neoplasm of brain: Secondary | ICD-10-CM | POA: Insufficient documentation

## 2018-10-07 DIAGNOSIS — I959 Hypotension, unspecified: Secondary | ICD-10-CM | POA: Diagnosis not present

## 2018-10-07 DIAGNOSIS — Z5112 Encounter for antineoplastic immunotherapy: Secondary | ICD-10-CM | POA: Insufficient documentation

## 2018-10-07 DIAGNOSIS — Z5111 Encounter for antineoplastic chemotherapy: Secondary | ICD-10-CM | POA: Diagnosis present

## 2018-10-07 DIAGNOSIS — I871 Compression of vein: Secondary | ICD-10-CM | POA: Diagnosis not present

## 2018-10-07 LAB — COMPREHENSIVE METABOLIC PANEL
ALT: 19 U/L (ref 0–44)
ANION GAP: 9 (ref 5–15)
AST: 21 U/L (ref 15–41)
Albumin: 3.5 g/dL (ref 3.5–5.0)
Alkaline Phosphatase: 86 U/L (ref 38–126)
BUN: 10 mg/dL (ref 8–23)
CO2: 27 mmol/L (ref 22–32)
Calcium: 9.2 mg/dL (ref 8.9–10.3)
Chloride: 101 mmol/L (ref 98–111)
Creatinine, Ser: 0.75 mg/dL (ref 0.61–1.24)
GFR calc Af Amer: >60 mL/min (ref >60)
GFR calc non Af Amer: >60 mL/min (ref >60)
Glucose, Bld: 136 mg/dL — ABNORMAL HIGH (ref 70–99)
Potassium: 5.1 mmol/L (ref 3.5–5.1)
SODIUM: 137 mmol/L (ref 135–145)
Total Bilirubin: 0.2 mg/dL — ABNORMAL LOW (ref 0.3–1.2)
Total Protein: 6.5 g/dL (ref 6.5–8.1)

## 2018-10-07 LAB — CBC WITH DIFFERENTIAL/PLATELET
Abs Immature Granulocytes: 0.08 10*3/uL — ABNORMAL HIGH (ref 0.00–0.07)
Basophils Absolute: 0.1 10*3/uL (ref 0.0–0.1)
Basophils Relative: 1 %
Eosinophils Absolute: 0 10*3/uL (ref 0.0–0.5)
Eosinophils Relative: 0 %
HCT: 36.2 % — ABNORMAL LOW (ref 39.0–52.0)
Hemoglobin: 11.9 g/dL — ABNORMAL LOW (ref 13.0–17.0)
Immature Granulocytes: 1 %
LYMPHS PCT: 15 %
Lymphs Abs: 1.2 10*3/uL (ref 0.7–4.0)
MCH: 31.7 pg (ref 26.0–34.0)
MCHC: 32.9 g/dL (ref 30.0–36.0)
MCV: 96.5 fL (ref 80.0–100.0)
Monocytes Absolute: 0.6 10*3/uL (ref 0.1–1.0)
Monocytes Relative: 7 %
Neutro Abs: 6.1 10*3/uL (ref 1.7–7.7)
Neutrophils Relative %: 76 %
Platelets: 319 10*3/uL (ref 150–400)
RBC: 3.75 MIL/uL — ABNORMAL LOW (ref 4.22–5.81)
RDW: 16.8 % — ABNORMAL HIGH (ref 11.5–15.5)
WBC: 8.1 10*3/uL (ref 4.0–10.5)
nRBC: 0 % (ref 0.0–0.2)

## 2018-10-07 MED ORDER — SODIUM CHLORIDE 0.9% FLUSH
10.0000 mL | INTRAVENOUS | Status: DC | PRN
  Administered 2018-10-07: 10 mL
  Filled 2018-10-07: qty 10

## 2018-10-07 MED ORDER — SODIUM CHLORIDE 0.9 % IV SOLN
200.0000 mg | Freq: Once | INTRAVENOUS | Status: AC
  Administered 2018-10-07: 200 mg via INTRAVENOUS
  Filled 2018-10-07: qty 10

## 2018-10-07 MED ORDER — HEPARIN SOD (PORK) LOCK FLUSH 100 UNIT/ML IV SOLN: 500.0000 [IU] | Freq: Once | INTRAVENOUS | Status: DC | PRN

## 2018-10-07 MED ORDER — PALONOSETRON HCL INJECTION 0.25 MG/5ML
0.2500 mg | Freq: Once | INTRAVENOUS | Status: AC
  Administered 2018-10-07: 0.25 mg via INTRAVENOUS
  Filled 2018-10-07: qty 5

## 2018-10-07 MED ORDER — SODIUM CHLORIDE 0.9 % IV SOLN
Freq: Once | INTRAVENOUS | Status: AC
  Administered 2018-10-07: 10:00:00 via INTRAVENOUS

## 2018-10-07 MED ORDER — SODIUM CHLORIDE 0.9 % IV SOLN
523.5000 mg | Freq: Once | INTRAVENOUS | Status: AC
  Administered 2018-10-07: 520 mg via INTRAVENOUS
  Filled 2018-10-07: qty 52

## 2018-10-07 MED ORDER — SODIUM CHLORIDE 0.9 % IV SOLN
Freq: Once | INTRAVENOUS | Status: AC
  Administered 2018-10-07: 11:00:00 via INTRAVENOUS
  Filled 2018-10-07: qty 5

## 2018-10-07 MED ORDER — SODIUM CHLORIDE 0.9 % IV SOLN
1500.0000 mg | Freq: Once | INTRAVENOUS | Status: AC
  Administered 2018-10-07: 1500 mg via INTRAVENOUS
  Filled 2018-10-07: qty 30

## 2018-10-07 NOTE — Progress Notes (Signed)
Patient tolerated chemotherapy with no complaints voiced.  Port site clean and dry with no bruising or swelling noted at site.  Good blood return noted before and after administration of chemotherapy.  Dressing intact.  Patient left ambulatory with VSS and no s/s of distress noted.

## 2018-10-07 NOTE — Patient Instructions (Signed)
[facility] Discharge Instructions for Patients Receiving Chemotherapy  Today you received the following chemotherapy agents  If you develop nausea and vomiting that is not controlled by your nausea medication, call the clinic.   BELOW ARE SYMPTOMS THAT SHOULD BE REPORTED IMMEDIATELY:  *FEVER GREATER THAN 100.5 F  *CHILLS WITH OR WITHOUT FEVER  NAUSEA AND VOMITING THAT IS NOT CONTROLLED WITH YOUR NAUSEA MEDICATION  *UNUSUAL SHORTNESS OF BREATH  *UNUSUAL BRUISING OR BLEEDING  TENDERNESS IN MOUTH AND THROAT WITH OR WITHOUT PRESENCE OF ULCERS  *URINARY PROBLEMS  *BOWEL PROBLEMS  UNUSUAL RASH Items with * indicate a potential emergency and should be followed up as soon as possible.  Feel free to call the clinic should you have any questions or concerns. The clinic phone number is [phone].  Please show the CHEMO ALERT CARD at check-in to the Emergency Department and triage nurse.   

## 2018-10-08 ENCOUNTER — Inpatient Hospital Stay (HOSPITAL_COMMUNITY): Payer: Medicare Other

## 2018-10-08 VITALS — BP 86/49 | HR 60 | Temp 98.6°F | Resp 16

## 2018-10-08 DIAGNOSIS — Z5112 Encounter for antineoplastic immunotherapy: Secondary | ICD-10-CM | POA: Diagnosis not present

## 2018-10-08 DIAGNOSIS — C3491 Malignant neoplasm of unspecified part of right bronchus or lung: Secondary | ICD-10-CM

## 2018-10-08 DIAGNOSIS — C771 Secondary and unspecified malignant neoplasm of intrathoracic lymph nodes: Secondary | ICD-10-CM

## 2018-10-08 MED ORDER — SODIUM CHLORIDE 0.9% FLUSH
10.0000 mL | INTRAVENOUS | Status: DC | PRN
  Administered 2018-10-08: 10 mL
  Filled 2018-10-08: qty 10

## 2018-10-08 MED ORDER — SODIUM CHLORIDE 0.9 % IV SOLN
200.0000 mg | Freq: Once | INTRAVENOUS | Status: AC
  Administered 2018-10-08: 200 mg via INTRAVENOUS
  Filled 2018-10-08: qty 10

## 2018-10-08 MED ORDER — SODIUM CHLORIDE 0.9 % IV SOLN
10.0000 mg | Freq: Once | INTRAVENOUS | Status: AC
  Administered 2018-10-08: 10 mg via INTRAVENOUS
  Filled 2018-10-08: qty 1

## 2018-10-08 MED ORDER — HEPARIN SOD (PORK) LOCK FLUSH 100 UNIT/ML IV SOLN
500.0000 [IU] | Freq: Once | INTRAVENOUS | Status: AC | PRN
  Administered 2018-10-08: 500 [IU]

## 2018-10-08 MED ORDER — SODIUM CHLORIDE 0.9 % IV SOLN
Freq: Once | INTRAVENOUS | Status: AC
  Administered 2018-10-08: 08:00:00 via INTRAVENOUS

## 2018-10-08 NOTE — Patient Instructions (Signed)
[facility] Discharge Instructions for Patients Receiving Chemotherapy  Today you received the following chemotherapy agents   To help prevent nausea and vomiting after your treatment, we encourage you to take your nausea medication   If you develop nausea and vomiting that is not controlled by your nausea medication, call the clinic.   BELOW ARE SYMPTOMS THAT SHOULD BE REPORTED IMMEDIATELY:  *FEVER GREATER THAN 100.5 F  *CHILLS WITH OR WITHOUT FEVER  NAUSEA AND VOMITING THAT IS NOT CONTROLLED WITH YOUR NAUSEA MEDICATION  *UNUSUAL SHORTNESS OF BREATH  *UNUSUAL BRUISING OR BLEEDING  TENDERNESS IN MOUTH AND THROAT WITH OR WITHOUT PRESENCE OF ULCERS  *URINARY PROBLEMS  *BOWEL PROBLEMS  UNUSUAL RASH Items with * indicate a potential emergency and should be followed up as soon as possible.  Feel free to call the clinic should you have any questions or concerns. The clinic phone number is [phone].  Please show the CHEMO ALERT CARD at check-in to the Emergency Department and triage nurse.   

## 2018-10-08 NOTE — Progress Notes (Signed)
Patient presented today for day 2 of treatment. No new issues today. Proceed per orders.   Treatment given per orders. Patient tolerated it well without problems. Vitals stable and discharged home from clinic ambulatory. Follow up as scheduled.

## 2018-10-09 ENCOUNTER — Encounter (HOSPITAL_COMMUNITY): Payer: Self-pay | Admitting: Hematology

## 2018-10-09 ENCOUNTER — Inpatient Hospital Stay (HOSPITAL_BASED_OUTPATIENT_CLINIC_OR_DEPARTMENT_OTHER): Payer: Medicare Other | Admitting: Hematology

## 2018-10-09 ENCOUNTER — Inpatient Hospital Stay (HOSPITAL_COMMUNITY): Payer: Medicare Other

## 2018-10-09 VITALS — BP 110/65 | HR 68 | Temp 97.6°F | Resp 18

## 2018-10-09 VITALS — BP 103/67 | HR 73 | Temp 97.6°F | Resp 18 | Wt 189.9 lb

## 2018-10-09 DIAGNOSIS — I959 Hypotension, unspecified: Secondary | ICD-10-CM

## 2018-10-09 DIAGNOSIS — C771 Secondary and unspecified malignant neoplasm of intrathoracic lymph nodes: Secondary | ICD-10-CM | POA: Diagnosis not present

## 2018-10-09 DIAGNOSIS — C3491 Malignant neoplasm of unspecified part of right bronchus or lung: Secondary | ICD-10-CM

## 2018-10-09 DIAGNOSIS — C7931 Secondary malignant neoplasm of brain: Secondary | ICD-10-CM | POA: Diagnosis not present

## 2018-10-09 DIAGNOSIS — Z5112 Encounter for antineoplastic immunotherapy: Secondary | ICD-10-CM | POA: Diagnosis not present

## 2018-10-09 DIAGNOSIS — I871 Compression of vein: Secondary | ICD-10-CM

## 2018-10-09 DIAGNOSIS — Z7901 Long term (current) use of anticoagulants: Secondary | ICD-10-CM

## 2018-10-09 MED ORDER — HEPARIN SOD (PORK) LOCK FLUSH 100 UNIT/ML IV SOLN
500.0000 [IU] | Freq: Once | INTRAVENOUS | Status: AC | PRN
  Administered 2018-10-09: 500 [IU]

## 2018-10-09 MED ORDER — SODIUM CHLORIDE 0.9% FLUSH
10.0000 mL | INTRAVENOUS | Status: DC | PRN
  Administered 2018-10-09: 10 mL
  Filled 2018-10-09: qty 10

## 2018-10-09 MED ORDER — SODIUM CHLORIDE 0.9 % IV SOLN
Freq: Once | INTRAVENOUS | Status: AC
  Administered 2018-10-09: 11:00:00 via INTRAVENOUS

## 2018-10-09 MED ORDER — CYANOCOBALAMIN 1000 MCG/ML IJ SOLN
INTRAMUSCULAR | Status: AC
  Filled 2018-10-09: qty 1

## 2018-10-09 MED ORDER — SODIUM CHLORIDE 0.9 % IV SOLN
10.0000 mg | Freq: Once | INTRAVENOUS | Status: AC
  Administered 2018-10-09: 10 mg via INTRAVENOUS
  Filled 2018-10-09: qty 1

## 2018-10-09 MED ORDER — SODIUM CHLORIDE 0.9 % IV SOLN
200.0000 mg | Freq: Once | INTRAVENOUS | Status: AC
  Administered 2018-10-09: 200 mg via INTRAVENOUS
  Filled 2018-10-09: qty 10

## 2018-10-09 NOTE — Patient Instructions (Signed)
[place] at [facility] Discharge Instructions  You were seen today by Dr. Delton Coombes, he discussed how you are feeling and how well you are eating. He would like you to continue drinking your Ensure and eating healthy meals. He examined you and discussed nausea, constipation and diarrhea. Your weight was stable from last visit. He would like you to discontinue your Micardis completely. He will schedule you for follow up scans and brain scan.    Thank you for choosing [place] at [facility] to provide your oncology and hematology care.  To afford each patient quality time with our provider, please arrive at least 15 minutes before your scheduled appointment time.   If you have a lab appointment with the [facility] please come in thru the  Main Entrance and check in at the main information desk  You need to re-schedule your appointment should you arrive 10 or more minutes late.  We strive to give you quality time with our providers, and arriving late affects you and other patients whose appointments are after yours.  Also, if you no show three or more times for appointments you may be dismissed from the clinic at the providers discretion.     Again, thank you for choosing [facility].  Our hope is that these requests will decrease the amount of time that you wait before being seen by our physicians.       _____________________________________________________________  Should you have questions after your visit to [facility], please contact our office at [phone] between the hours of 8:00 a.m. and 4:30 p.m.  Voicemails left after 4:00 p.m. will not be returned until the following business day.  For prescription refill requests, have your pharmacy contact our office and allow 72 hours.    Cancer Center Support Programs:   > Cancer Support Group  2nd Tuesday of the month 1pm-2pm, Journey Room

## 2018-10-09 NOTE — Progress Notes (Signed)
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   CURRENT THERAPY: Carboplatin, etoposide, and Imfinzi     BRIEF ONCOLOGIC HISTORY:    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[place])   08/22/2018 Initial Diagnosis    Small cell carcinoma of lung, right ([place])    08/23/2018 -  Chemotherapy    The patient had palonosetron (ALOXI) injection 0.25 mg, 0.25 mg, Intravenous,  Once, 3 of 4 cycles Administration: 0.25 mg (08/23/2018), 0.25 mg (09/16/2018), 0.25 mg (10/07/2018) pegfilgrastim-cbqv (UDENYCA) injection 6 mg, 6 mg, Subcutaneous, Once, 3 of 4 cycles Administration: 6 mg (08/27/2018), 6 mg (09/20/2018) CARBOplatin (PARAPLATIN) 560 mg in sodium chloride 0.9 % 250 mL chemo infusion, 560 mg (100 % of original dose 564 mg), Intravenous,  Once, 3 of 4 cycles Dose modification:   (original dose 564 mg, Cycle 1),   (original dose 523.5 mg, Cycle 3) Administration: 560 mg (08/23/2018), 560 mg (09/16/2018), 520 mg (10/07/2018) etoposide (VEPESID) 220 mg in sodium chloride 0.9 % 1,000 mL chemo infusion, 100 mg/m2 = 220 mg, Intravenous,  Once, 3 of 4 cycles Administration: 220 mg (08/23/2018), 200 mg (09/16/2018), 200 mg (09/17/2018), 200 mg (09/18/2018), 200 mg (10/07/2018), 200 mg (10/08/2018) fosaprepitant (EMEND) 150 mg, dexamethasone (DECADRON) 12 mg in sodium chloride 0.9 % 145 mL IVPB, , Intravenous,  Once, 3 of 4 cycles Administration:  (08/23/2018),  (09/16/2018),  (10/07/2018) durvalumab (IMFINZI) 1,500 mg in sodium chloride 0.9 % 100 mL chemo infusion, 1,500 mg (100 % of original dose 1,500 mg), Intravenous,  Once, 2 of 3 cycles Dose modification: 1,500 mg (original dose 1,500 mg, Cycle 2, Reason: Provider Judgment) Administration: 1,500  mg (09/16/2018), 1,500 mg (10/07/2018)  for chemotherapy treatment.      Small cell carcinoma of lung, right ([place])   08/23/2018 -  Chemotherapy    The patient had palonosetron (ALOXI) injection 0.25 mg, 0.25 mg, Intravenous,  Once, 3 of 4 cycles Administration: 0.25 mg (08/23/2018), 0.25 mg (09/16/2018), 0.25 mg (10/07/2018) pegfilgrastim-cbqv (UDENYCA) injection 6 mg, 6 mg, Subcutaneous, Once, 3 of 4 cycles Administration: 6 mg (08/27/2018), 6 mg (09/20/2018) CARBOplatin (PARAPLATIN) 560 mg in sodium chloride 0.9 % 250 mL chemo infusion, 560 mg (100 % of original dose 564 mg), Intravenous,  Once, 3 of 4 cycles Dose modification:   (original dose 564 mg, Cycle 1),   (original dose 523.5 mg, Cycle 3) Administration: 560 mg (08/23/2018), 560 mg (09/16/2018), 520 mg (10/07/2018) etoposide (VEPESID) 220 mg in sodium chloride 0.9 % 1,000 mL chemo infusion, 100 mg/m2 = 220 mg, Intravenous,  Once, 3 of 4 cycles Administration: 220 mg (08/23/2018), 200 mg (09/16/2018), 200 mg (09/17/2018), 200 mg (09/18/2018), 200 mg (10/07/2018), 200 mg (10/08/2018) fosaprepitant (EMEND) 150 mg, dexamethasone (DECADRON) 12 mg in sodium chloride 0.9 % 145 mL IVPB, , Intravenous,  Once, 3 of 4 cycles Administration:  (08/23/2018),  (09/16/2018),  (10/07/2018) durvalumab (IMFINZI) 1,500 mg in sodium chloride 0.9 % 100 mL chemo infusion, 1,500 mg (100 % of original dose 1,500 mg), Intravenous,  Once, 2 of 3 cycles Dose modification: 1,500 mg (original dose 1,500 mg, Cycle 2, Reason: Provider Judgment) Administration: 1,500 mg (09/16/2018), 1,500 mg (10/07/2018)  for chemotherapy treatment.     08/26/2018 Initial Diagnosis    Small cell carcinoma  of lung, right [facility])     INTERVAL HISTORY:  [name] 73 y.o. 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 He is here today with family. He reports he has alternating diarrhea and constipation. He is taking nausea medication and it is causing him to  have a dry mouth. Denies any vomiting. Denies any new pains. Had not noticed any recent bleeding such as epistaxis, hematuria or hematochezia. Denies recent chest pain on exertion, shortness of breath on minimal exertion, pre-syncopal episodes, or palpitations. Denies any numbness or tingling in hands or feet. Denies any recent fevers, infections, or recent hospitalizations. Patient reports appetite at 50% and energy level at 25%. He is trying to maintain his weight and eat well. He reports he is drinking ensure daily. His taste buds are decreased and that's why his intake is decreased.    REVIEW OF SYSTEMS:  Review of Systems  Constitutional: Positive for fatigue.  HENT:         Dry mouth  Gastrointestinal: Positive for constipation, diarrhea and nausea.  All other systems reviewed and are negative.    PAST MEDICAL/SURGICAL HISTORY:  Past Medical History:  Diagnosis Date  . Actinic keratosis   . Arthritis   . Colitis MAY 2012    CT ABD/PELVIS HEP FLEXURE  . COPD (chronic obstructive pulmonary disease) ([place])   . Diabetes mellitus without complication ([place])   . History of kidney stones   . Hyperlipemia   . Hypertension   . NSAID long-term use NAPROXEN FOR OA  . SCL Ca dx'd 08/2018   Past Surgical History:  Procedure Laterality Date  . BACK SURGERY     spinal stenosis  . BASAL CELL CARCINOMA EXCISION  FACE, arms feet, leg  . CHOLECYSTECTOMY  JUNE 2011 MJ   STONES, PANCREATITIS  . KNEE ARTHROSCOPY WITH MEDIAL MENISECTOMY Right 03/24/2015   Procedure: KNEE ARTHROSCOPY WITH MEDIAL MENISECTOMY;  Surgeon: Carole Civil, MD;  Location: [facility];  Service: Orthopedics;  Laterality: Right;  . KNEE SURGERY Left LEFT   arthroscopy  . LITHOTRIPSY  80s  . PORTACATH PLACEMENT Left 09/11/2018   Procedure: INSERTION PORT-A-CATH;  Surgeon: Virl Cagey, MD;  Location: [facility];  Service: General;  Laterality: Left;  . SPINE SURGERY       SOCIAL HISTORY:  Social History    Socioeconomic History  . Marital status: Divorced    Spouse name: Not on file  . Number of children: 0  . Years of education: Not on file  . Highest education level: Not on file  Occupational History  . Occupation: [place] [employer]   . Occupation: Manufacturing engineer   Social Needs  . Financial resource strain: Not hard at all  . Food insecurity:    Worry: Never true    Inability: Never true  . Transportation needs:    Medical: No    Non-medical: No  Tobacco Use  . Smoking status: Former Smoker    Packs/day: 1.00    Years: 50.00    Pack years: 50.00    Types: Cigarettes    Last attempt to quit: 08/07/2018    Years since quitting: 0.1  . Smokeless tobacco: Never Used  Substance and Sexual Activity  . Alcohol use: Yes    Comment: once a month  . Drug use: No  . Sexual activity: Never    Birth control/protection: None  Lifestyle  . Physical activity:    Days per week: 0 days  Minutes per session: 0 min  . Stress: Not at all  Relationships  . Social connections:    Talks on phone: Never    Gets together: More than three times a week    Attends religious service: Never    Active member of club or organization: Yes    Attends meetings of clubs or organizations: More than 4 times per year    Relationship status: Divorced  . Intimate partner violence:    Fear of current or ex partner: No    Emotionally abused: No    Physically abused: No    Forced sexual activity: No  Other Topics Concern  . 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    FAMILY HISTORY:  Family History  Problem Relation Age of Onset  . Cancer Mother        lung  . Cancer Father        lung and liver  . Colon polyps Neg Hx   . Colon cancer Neg Hx     CURRENT MEDICATIONS:  Outpatient Encounter Medications as of 10/09/2018  Medication Sig  . albuterol (PROVENTIL HFA;VENTOLIN HFA) 108 (90 Base)  MCG/ACT inhaler INHALE 2 PUFFS INTO THE LUNGS QID  . aspirin 81 MG tablet Take 81 mg by mouth daily.    Marland Kitchen CARBOPLATIN IV Inject into the vein every 21 ( twenty-one) days.  . Cholecalciferol 50 MCG (2000 UT) TBDP Take 1 tablet by mouth daily.  [place] Oris (IMFINZI IV) Inject into the vein every 21 ( twenty-one) days.  . ETOPOSIDE IV Inject into the vein every 21 ( twenty-one) days.  . metFORMIN (GLUCOPHAGE) 500 MG tablet Take 500 mg by mouth 2 (two) times daily with a meal.  . Multiple Vitamin (MULTIVITAMIN) tablet Take 1 tablet by mouth daily.  . ondansetron (ZOFRAN) 8 MG tablet Take 1 tablet (8 mg total) by mouth every 8 (eight) hours as needed for nausea or vomiting.  . rosuvastatin (CRESTOR) 10 MG tablet Take 10 mg by mouth daily.    Marland Kitchen scopolamine (TRANSDERM-SCOP) 1 MG/3DAYS Place 1 patch (1.5 mg total) onto the skin every 3 (three) days.  Marland Kitchen telmisartan-hydrochlorothiazide (MICARDIS HCT) 40-12.5 MG per tablet Take 1 tablet by mouth daily.    . verapamil (COVERA HS) 240 MG (CO) 24 hr tablet Take 240 mg by mouth at bedtime.    Marland Kitchen HYDROcodone-acetaminophen (NORCO) 5-325 MG tablet Take 1 tablet by mouth every 4 (four) hours as needed for moderate pain. (Patient not taking: Reported on 10/09/2018)  . sucralfate (CARAFATE) 1 g tablet Take 1 tablet (1 g total) by mouth 4 (four) times daily. (Patient not taking: Reported on 10/09/2018)   No facility-administered encounter medications on file as of 10/09/2018.     ALLERGIES:  No Known Allergies   PHYSICAL EXAM:  ECOG Performance status: 1  Vitals:   10/09/18 0945  BP: 103/67  Pulse: 73  Resp: 18  Temp: 97.6 F (36.4 C)  SpO2: 96%   Filed Weights   10/09/18 0945  Weight: 189 lb 14.4 oz (86.1 kg)    Physical Exam Constitutional:      Appearance: Normal appearance. He is normal weight.  Cardiovascular:     Rate and Rhythm: Normal rate and regular rhythm.     Heart sounds: Normal heart sounds.  Pulmonary:     Effort: Pulmonary effort  is normal.  Breath sounds: Normal breath sounds.  Musculoskeletal: Normal range of motion.  Skin:    General: Skin is warm and dry.  Neurological:     Mental Status: He is alert and oriented to person, place, and time. Mental status is at baseline.  Psychiatric:        Mood and Affect: Mood normal.        Behavior: Behavior normal.        Thought Content: Thought content normal.        Judgment: Judgment normal.      LABORATORY DATA:  I have reviewed the labs as listed.  CBC    Component Value Date/Time   WBC 8.1 10/07/2018 0807   RBC 3.75 (L) 10/07/2018 0807   HGB 11.9 (L) 10/07/2018 0807   HCT 36.2 (L) 10/07/2018 0807   PLT 319 10/07/2018 0807   MCV 96.5 10/07/2018 0807   MCH 31.7 10/07/2018 0807   MCHC 32.9 10/07/2018 0807   RDW 16.8 (H) 10/07/2018 0807   LYMPHSABS 1.2 10/07/2018 0807   MONOABS 0.6 10/07/2018 0807   EOSABS 0.0 10/07/2018 0807   BASOSABS 0.1 10/07/2018 0807   CMP Latest Ref Rng & Units 10/07/2018 09/16/2018 08/26/2018  Glucose 70 - 99 mg/dL 136(H) 144(H) 117(H)  BUN 8 - 23 mg/dL 10 13 20   Creatinine 0.61 - 1.24 mg/dL 0.75 0.97 0.61  Sodium 135 - 145 mmol/L 137 136 138  Potassium 3.5 - 5.1 mmol/L 5.1 4.9 4.6  Chloride 98 - 111 mmol/L 101 99 104  CO2 22 - 32 mmol/L 27 27 28   Calcium 8.9 - 10.3 mg/dL 9.2 9.4 8.5(L)  Total Protein 6.5 - 8.1 g/dL 6.5 7.1 5.6(L)  Total Bilirubin 0.3 - 1.2 mg/dL 0.2(L) 0.5 0.9  Alkaline Phos 38 - 126 U/L 86 95 58  AST 15 - 41 U/L 21 22 19   ALT 0 - 44 U/L 19 26 27        DIAGNOSTIC IMAGING:  I have independently reviewed the scans and discussed with the patient.   I have reviewed Francene Finders, NP's note and agree with the documentation.  I personally performed a face-to-face visit, made revisions and my assessment and plan is as follows.    ASSESSMENT & PLAN:   Small cell carcinoma of lung, right ([place]) 1.  Extensive stage small cell lung cancer: -Patient noted bilateral axillary adenopathy starting in  October 2019. - He was evaluated at [facility] with a biopsy of the axillary lymph node.  He was also diagnosed with small pulmonary embolism and was started on Eliquis. - On 08/21/2018, he presented to the ER with shortness of breath.  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 -He was admitted to [facility] long and was treated with first cycle of chemotherapy with carboplatin and VP-16 on 08/23/2018 along with radiation to the chest.  - MRI of the brain on 08/22/2018 shows 3 subcentimeter lesions within the brain, present in the right insula, right occipital lobe and right cerebellar hemisphere. -He tolerated first cycle very well without any major side effects. - WBRT from 09/02/2018 through 09/13/2018. - Cycle 2 on 09/16/2018 with addition of durvalumab.  He tolerated very well. -He had nausea after cycle 1 which was well controlled with the addition of scopolamine.  He developed dry mouth. -We reviewed blood work.  He may proceed with cycle 3.  I plan to repeat CT CAP and MRI of the brain prior to next visit in 3 weeks.  2.  Brain metastasis: -  MRI of the brain on 08/22/2018 showed 3 small subcentimeter metastasis in the right insula, right occipital lobe and right cerebellar hemisphere. - WBRT from 09/02/2018 through 09/13/2018.  3.  Hypotension: -He does have occasional episodes of dizziness on getting up.  Blood pressure today is 103/67. -He is taking Micardis half tablet daily and verapamil 240 mg every 24 hours.  I have told him to hold off taking Micardis.      Orders placed this encounter:  Orders Placed This Encounter  Procedures  . MR Brain W Wo Contrast  . CT Chest W Contrast  . CT Abdomen Pelvis W Contrast  . Magnesium  . CBC with Differential/Platelet  . Comprehensive metabolic panel      Derek Jack, MD [place] [phone]

## 2018-10-09 NOTE — Patient Instructions (Signed)
[facility] Discharge Instructions for Patients Receiving Chemotherapy  Today you received the following chemotherapy agents  If you develop nausea and vomiting that is not controlled by your nausea medication, call the clinic.   BELOW ARE SYMPTOMS THAT SHOULD BE REPORTED IMMEDIATELY:  *FEVER GREATER THAN 100.5 F  *CHILLS WITH OR WITHOUT FEVER  NAUSEA AND VOMITING THAT IS NOT CONTROLLED WITH YOUR NAUSEA MEDICATION  *UNUSUAL SHORTNESS OF BREATH  *UNUSUAL BRUISING OR BLEEDING  TENDERNESS IN MOUTH AND THROAT WITH OR WITHOUT PRESENCE OF ULCERS  *URINARY PROBLEMS  *BOWEL PROBLEMS  UNUSUAL RASH Items with * indicate a potential emergency and should be followed up as soon as possible.  Feel free to call the clinic should you have any questions or concerns. The clinic phone number is [phone].  Please show the CHEMO ALERT CARD at check-in to the Emergency Department and triage nurse.   

## 2018-10-09 NOTE — Progress Notes (Signed)
Patient seen by Dr. Delton Coombes with lab review and ok to treat today.    Patient tolerated chemotherapy with no complaints voiced.  Port site clean and dry with no bruising or swelling noted at site.  Good blood return noted before and after administration of chemotherapy.  Band aid applied.  Patient left ambulatory with VSS and no s/s of distress noted.

## 2018-10-09 NOTE — Assessment & Plan Note (Signed)
1.  Extensive stage small cell lung cancer: -Patient noted bilateral axillary adenopathy starting in October 2019. - He was evaluated at [facility] with a biopsy of the axillary lymph node.  He was also diagnosed with small pulmonary embolism and was started on Eliquis. - On 08/21/2018, he presented to the ER with shortness of breath.  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 -He was admitted to [facility] long and was treated with first cycle of chemotherapy with carboplatin and VP-16 on 08/23/2018 along with radiation to the chest.  - MRI of the brain on 08/22/2018 shows 3 subcentimeter lesions within the brain, present in the right insula, right occipital lobe and right cerebellar hemisphere. -He tolerated first cycle very well without any major side effects. - WBRT from 09/02/2018 through 09/13/2018. - Cycle 2 on 09/16/2018 with addition of durvalumab.  He tolerated very well. -He had nausea after cycle 1 which was well controlled with the addition of scopolamine.  He developed dry mouth. -We reviewed blood work.  He may proceed with cycle 3.  I plan to repeat CT CAP and MRI of the brain prior to next visit in 3 weeks.  2.  Brain metastasis: - MRI of the brain on 08/22/2018 showed 3 small subcentimeter metastasis in the right insula, right occipital lobe and right cerebellar hemisphere. - WBRT from 09/02/2018 through 09/13/2018.  3.  Hypotension: -He does have occasional episodes of dizziness on getting up.  Blood pressure today is 103/67. -He is taking Micardis half tablet daily and verapamil 240 mg every 24 hours.  I have told him to hold off taking Micardis.

## 2018-10-11 ENCOUNTER — Inpatient Hospital Stay (HOSPITAL_COMMUNITY): Payer: Medicare Other

## 2018-10-11 ENCOUNTER — Other Ambulatory Visit: Payer: Self-pay

## 2018-10-11 ENCOUNTER — Encounter (HOSPITAL_COMMUNITY): Payer: Self-pay

## 2018-10-11 VITALS — BP 123/70 | HR 84 | Temp 97.8°F | Resp 18

## 2018-10-11 DIAGNOSIS — C3491 Malignant neoplasm of unspecified part of right bronchus or lung: Secondary | ICD-10-CM

## 2018-10-11 DIAGNOSIS — C771 Secondary and unspecified malignant neoplasm of intrathoracic lymph nodes: Secondary | ICD-10-CM

## 2018-10-11 DIAGNOSIS — Z5112 Encounter for antineoplastic immunotherapy: Secondary | ICD-10-CM | POA: Diagnosis not present

## 2018-10-11 MED ORDER — PEGFILGRASTIM-CBQV 6 MG/0.6ML ~~LOC~~ SOSY
6.0000 mg | PREFILLED_SYRINGE | Freq: Once | SUBCUTANEOUS | Status: AC
  Administered 2018-10-11: 6 mg via SUBCUTANEOUS
  Filled 2018-10-11: qty 0.6

## 2018-10-11 MED ORDER — LEVOFLOXACIN 500 MG PO TABS: 500.0000 mg | ORAL_TABLET | Freq: Every day | ORAL | 0 refills | Status: DC

## 2018-10-11 NOTE — Progress Notes (Signed)
[name] tolerated Udenyca infusion well without complaints or incident.VSS Pt complained of a cough that started yesterday and kept him up last night. Pt is coughing up thick yellow sputum but denies any fever or dyspnea. Discussed these issues with Dr. Delton Coombes and sent in a script for Levaquin 500 mg PO daily x 7 days per MD order. Reviewed this information with the pt who verbalized understanding. Also reminded the pt to go to ER for temp > 100.4. Pt discharged self ambulatory in satisfactory condition

## 2018-10-11 NOTE — Patient Instructions (Signed)
[place] at [facility] Discharge Instructions  Received Udenyca injection today. Follow-up as scheduled. Call clinic for any questions or concerns   Thank you for choosing [place] at [facility] to provide your oncology and hematology care.  To afford each patient quality time with our provider, please arrive at least 15 minutes before your scheduled appointment time.   If you have a lab appointment with the [facility] please come in thru the  Main Entrance and check in at the main information desk  You need to re-schedule your appointment should you arrive 10 or more minutes late.  We strive to give you quality time with our providers, and arriving late affects you and other patients whose appointments are after yours.  Also, if you no show three or more times for appointments you may be dismissed from the clinic at the providers discretion.     Again, thank you for choosing [facility].  Our hope is that these requests will decrease the amount of time that you wait before being seen by our physicians.       _____________________________________________________________  Should you have questions after your visit to [facility], please contact our office at [phone] between the hours of 8:00 a.m. and 4:30 p.m.  Voicemails left after 4:00 p.m. will not be returned until the following business day.  For prescription refill requests, have your pharmacy contact our office and allow 72 hours.    Cancer Center Support Programs:   > Cancer Support Group  2nd Tuesday of the month 1pm-2pm, Journey Room

## 2018-10-21 ENCOUNTER — Telehealth: Payer: Self-pay | Admitting: Radiation Oncology

## 2018-10-21 NOTE — Telephone Encounter (Signed)
LM for the patient to let him know we'd cancel his appt on Wednesday given that we're cancelling nonurgent medical appointments at the cancer center due to coronavirus risks, but that he would be due for a brain MRI in May, 3 months following completion of his treatment. I encouraged him to call me back so we could discuss this further.

## 2018-10-22 NOTE — Progress Notes (Signed)
  Radiation Oncology         [phone] ________________________________  Name: [name] MRN: [id number]  Date: 09/13/2018  DOB: Dec 28, 1945  End of Treatment Note  Diagnosis:   73 y.o. male with Extensive Stage Small Cell Carcinoma of the Right lung with SVC syndrome and axillary adenopathy     Indication for treatment:  Palliative, Prophylactic Cranial Irradiation       Radiation treatment dates:   08/22/2018 - 09/13/2018  Site/dose:    1. Chest / 9 Gy in 3 fractions 2. Chest Target 1 / 25 Gy in 10 fractions - Total dose 34 Gy 3. Whole Brain PCI / 30 Gy in 10 fractions  Beams/energy:    1. 3D / 15X Photon 2. 3D / 15X Photon 3. Isodose Plan / 6X Photon  Narrative: The patient tolerated radiation treatment relatively well.   He experienced increased fatigue and some nausea, relieved with Zofran. He endorses poor appetite but states that he is drinking well and using supplements. No complaints of shortness of breath or headaches. Skin is hyperpigmented and itchy, and he continues to use his cream as prescribed.    Plan: The patient has completed radiation treatment. The patient will return to radiation oncology clinic for routine followup in one month. I advised them to call or return sooner if they have any questions or concerns related to their recovery or treatment.  ------------------------------------------------  Jodelle Gross, MD, PhD  This document serves as a record of services personally performed by Kyung Rudd, MD. It was created on his behalf by Rae Lips, a trained medical scribe. The creation of this record is based on the scribe's personal observations and the provider's statements to them. This document has been checked and approved by the attending provider.

## 2018-10-23 ENCOUNTER — Ambulatory Visit: Payer: Self-pay | Admitting: Radiation Oncology

## 2018-10-24 ENCOUNTER — Ambulatory Visit (HOSPITAL_COMMUNITY)
Admission: RE | Admit: 2018-10-24 | Discharge: 2018-10-24 | Disposition: A | Discharge status: Home / Self Care | Payer: Medicare Other | Source: Ambulatory Visit | Attending: Nurse Practitioner | Admitting: Nurse Practitioner

## 2018-10-24 ENCOUNTER — Other Ambulatory Visit: Payer: Self-pay

## 2018-10-24 DIAGNOSIS — C3491 Malignant neoplasm of unspecified part of right bronchus or lung: Secondary | ICD-10-CM | POA: Insufficient documentation

## 2018-10-24 MED ORDER — IOHEXOL 300 MG/ML  SOLN
100.0000 mL | Freq: Once | INTRAMUSCULAR | Status: AC | PRN
  Administered 2018-10-24: 100 mL via INTRAVENOUS

## 2018-10-24 MED ORDER — GADOBUTROL 1 MMOL/ML IV SOLN
6.0000 mL | Freq: Once | INTRAVENOUS | Status: AC | PRN
  Administered 2018-10-24: 6 mL via INTRAVENOUS

## 2018-10-28 ENCOUNTER — Inpatient Hospital Stay (HOSPITAL_COMMUNITY): Payer: Medicare Other

## 2018-10-28 ENCOUNTER — Other Ambulatory Visit: Payer: Self-pay

## 2018-10-28 VITALS — BP 109/69 | HR 77 | Temp 98.0°F | Resp 18 | Wt 177.6 lb

## 2018-10-28 DIAGNOSIS — C3491 Malignant neoplasm of unspecified part of right bronchus or lung: Secondary | ICD-10-CM

## 2018-10-28 DIAGNOSIS — C771 Secondary and unspecified malignant neoplasm of intrathoracic lymph nodes: Secondary | ICD-10-CM

## 2018-10-28 DIAGNOSIS — Z5112 Encounter for antineoplastic immunotherapy: Secondary | ICD-10-CM | POA: Diagnosis not present

## 2018-10-28 LAB — CBC WITH DIFFERENTIAL/PLATELET
Abs Immature Granulocytes: 0.14 10*3/uL — ABNORMAL HIGH (ref 0.00–0.07)
BASOS PCT: 1 %
Basophils Absolute: 0 10*3/uL (ref 0.0–0.1)
Eosinophils Absolute: 0 10*3/uL (ref 0.0–0.5)
Eosinophils Relative: 0 %
HCT: 34.1 % — ABNORMAL LOW (ref 39.0–52.0)
Hemoglobin: 11.3 g/dL — ABNORMAL LOW (ref 13.0–17.0)
Immature Granulocytes: 2 %
Lymphocytes Relative: 13 %
Lymphs Abs: 1 10*3/uL (ref 0.7–4.0)
MCH: 32.4 pg (ref 26.0–34.0)
MCHC: 33.1 g/dL (ref 30.0–36.0)
MCV: 97.7 fL (ref 80.0–100.0)
MONOS PCT: 10 %
Monocytes Absolute: 0.8 10*3/uL (ref 0.1–1.0)
Neutro Abs: 5.8 10*3/uL (ref 1.7–7.7)
Neutrophils Relative %: 74 %
Platelets: 280 10*3/uL (ref 150–400)
RBC: 3.49 MIL/uL — ABNORMAL LOW (ref 4.22–5.81)
RDW: 19.3 % — ABNORMAL HIGH (ref 11.5–15.5)
WBC: 7.8 10*3/uL (ref 4.0–10.5)
nRBC: 0 % (ref 0.0–0.2)

## 2018-10-28 LAB — COMPREHENSIVE METABOLIC PANEL
ALT: 30 U/L (ref 0–44)
AST: 24 U/L (ref 15–41)
Albumin: 3.8 g/dL (ref 3.5–5.0)
Alkaline Phosphatase: 80 U/L (ref 38–126)
Anion gap: 11 (ref 5–15)
BUN: 11 mg/dL (ref 8–23)
CALCIUM: 9.3 mg/dL (ref 8.9–10.3)
CHLORIDE: 103 mmol/L (ref 98–111)
CO2: 23 mmol/L (ref 22–32)
Creatinine, Ser: 0.67 mg/dL (ref 0.61–1.24)
GFR calc Af Amer: >60 mL/min (ref >60)
GFR calc non Af Amer: >60 mL/min (ref >60)
GLUCOSE: 107 mg/dL — AB (ref 70–99)
Potassium: 4 mmol/L (ref 3.5–5.1)
SODIUM: 137 mmol/L (ref 135–145)
Total Bilirubin: 0.6 mg/dL (ref 0.3–1.2)
Total Protein: 6.6 g/dL (ref 6.5–8.1)

## 2018-10-28 LAB — MAGNESIUM: Magnesium: 2 mg/dL (ref 1.7–2.4)

## 2018-10-28 MED ORDER — SODIUM CHLORIDE 0.9 % IV SOLN
1500.0000 mg | Freq: Once | INTRAVENOUS | Status: AC
  Administered 2018-10-28: 1500 mg via INTRAVENOUS
  Filled 2018-10-28: qty 30

## 2018-10-28 MED ORDER — SODIUM CHLORIDE 0.9 % IV SOLN
95.0000 mg/m2 | Freq: Once | INTRAVENOUS | Status: AC
  Administered 2018-10-28: 200 mg via INTRAVENOUS
  Filled 2018-10-28: qty 10

## 2018-10-28 MED ORDER — SODIUM CHLORIDE 0.9% FLUSH
10.0000 mL | INTRAVENOUS | Status: DC | PRN
  Administered 2018-10-28: 10 mL
  Filled 2018-10-28: qty 10

## 2018-10-28 MED ORDER — HEPARIN SOD (PORK) LOCK FLUSH 100 UNIT/ML IV SOLN
INTRAVENOUS | Status: AC
  Filled 2018-10-28: qty 5

## 2018-10-28 MED ORDER — SODIUM CHLORIDE 0.9 % IV SOLN
510.0000 mg | Freq: Once | INTRAVENOUS | Status: AC
  Administered 2018-10-28: 510 mg via INTRAVENOUS
  Filled 2018-10-28: qty 51

## 2018-10-28 MED ORDER — SODIUM CHLORIDE 0.9 % IV SOLN
Freq: Once | INTRAVENOUS | Status: AC
  Administered 2018-10-28: 10:00:00 via INTRAVENOUS
  Filled 2018-10-28: qty 5

## 2018-10-28 MED ORDER — HEPARIN SOD (PORK) LOCK FLUSH 100 UNIT/ML IV SOLN
500.0000 [IU] | Freq: Once | INTRAVENOUS | Status: AC | PRN
  Administered 2018-10-28: 500 [IU]

## 2018-10-28 MED ORDER — PALONOSETRON HCL INJECTION 0.25 MG/5ML
0.2500 mg | Freq: Once | INTRAVENOUS | Status: AC
  Administered 2018-10-28: 0.25 mg via INTRAVENOUS
  Filled 2018-10-28: qty 5

## 2018-10-28 MED ORDER — SODIUM CHLORIDE 0.9 % IV SOLN
Freq: Once | INTRAVENOUS | Status: AC
  Administered 2018-10-28: 09:00:00 via INTRAVENOUS

## 2018-10-28 NOTE — Progress Notes (Signed)
Trenell W Murren tolerated Imfinzi, VP-16, and Carboplatin without incident or complaint. VSS. Port flushed with heparin and left accessed for use tomorrow. Discharged self ambulatory in satisfactory condition.

## 2018-10-28 NOTE — Patient Instructions (Signed)
[facility] Discharge Instructions for Patients Receiving Chemotherapy   Beginning January 23rd 2017 lab work for the [facility] will be done in the  Main lab at [facility] on 1st floor. If you have a lab appointment with the [facility] please come in thru the  Main Entrance and check in at the main information desk   Today you received the following chemotherapy agents Imfinzi, Etoposide, and Carboplatin.  To help prevent nausea and vomiting after your treatment, we encourage you to take your nausea medication   If you develop nausea and vomiting, or diarrhea that is not controlled by your medication, call the clinic.  The clinic phone number is [phone]. Office hours are Monday-Friday 8:30am-5:00pm.  BELOW ARE SYMPTOMS THAT SHOULD BE REPORTED IMMEDIATELY:  *FEVER GREATER THAN 101.0 F  *CHILLS WITH OR WITHOUT FEVER  NAUSEA AND VOMITING THAT IS NOT CONTROLLED WITH YOUR NAUSEA MEDICATION  *UNUSUAL SHORTNESS OF BREATH  *UNUSUAL BRUISING OR BLEEDING  TENDERNESS IN MOUTH AND THROAT WITH OR WITHOUT PRESENCE OF ULCERS  *URINARY PROBLEMS  *BOWEL PROBLEMS  UNUSUAL RASH Items with * indicate a potential emergency and should be followed up as soon as possible. If you have an emergency after office hours please contact your primary care physician or go to the nearest emergency department.  Please call the clinic during office hours if you have any questions or concerns.   You may also contact the Patient Navigator at [phone] should you have any questions or need assistance in obtaining follow up care.      Resources For Cancer Patients and their Caregivers ? American Cancer Society: Can assist with transportation, wigs, general needs, runs Look Good Feel Better.        (516)855-7207 ? Cancer Care: Provides financial assistance, online support groups, medication/co-pay assistance.  1-800-813-HOPE 3645756756) ? [place] Assists [place] cancer patients and their families through emotional , educational and financial support.  9858079306 ? [place] DSS Where to apply for food stamps, Medicaid and utility assistance. 5812538069 ? [facility]: Transportation to medical appointments. (831) 667-3494 ? Social Security Administration: May apply for disability if have a Stage IV cancer. 2186949411 (367) 434-4236 ? [employer], Disability and Transit Services: Assists with nutrition, care and transit needs. 606-277-3898

## 2018-10-29 ENCOUNTER — Encounter (HOSPITAL_COMMUNITY): Payer: Self-pay

## 2018-10-29 ENCOUNTER — Inpatient Hospital Stay (HOSPITAL_COMMUNITY): Payer: Medicare Other

## 2018-10-29 ENCOUNTER — Other Ambulatory Visit (HOSPITAL_COMMUNITY): Payer: Self-pay | Admitting: Nurse Practitioner

## 2018-10-29 VITALS — BP 89/52 | HR 59 | Temp 97.7°F | Resp 18

## 2018-10-29 DIAGNOSIS — Z5112 Encounter for antineoplastic immunotherapy: Secondary | ICD-10-CM | POA: Diagnosis not present

## 2018-10-29 DIAGNOSIS — C3491 Malignant neoplasm of unspecified part of right bronchus or lung: Secondary | ICD-10-CM

## 2018-10-29 DIAGNOSIS — I158 Other secondary hypertension: Secondary | ICD-10-CM

## 2018-10-29 DIAGNOSIS — C771 Secondary and unspecified malignant neoplasm of intrathoracic lymph nodes: Secondary | ICD-10-CM

## 2018-10-29 MED ORDER — SODIUM CHLORIDE 0.9 % IV SOLN
Freq: Once | INTRAVENOUS | Status: AC
  Administered 2018-10-29: 10:00:00 via INTRAVENOUS

## 2018-10-29 MED ORDER — SODIUM CHLORIDE 0.9 % IV SOLN
10.0000 mg | Freq: Once | INTRAVENOUS | Status: AC
  Administered 2018-10-29: 10 mg via INTRAVENOUS
  Filled 2018-10-29: qty 1

## 2018-10-29 MED ORDER — HEPARIN SOD (PORK) LOCK FLUSH 100 UNIT/ML IV SOLN
500.0000 [IU] | Freq: Once | INTRAVENOUS | Status: AC | PRN
  Administered 2018-10-29: 500 [IU]

## 2018-10-29 MED ORDER — SODIUM CHLORIDE 0.9 % IV SOLN
95.0000 mg/m2 | Freq: Once | INTRAVENOUS | Status: AC
  Administered 2018-10-29: 200 mg via INTRAVENOUS
  Filled 2018-10-29: qty 10

## 2018-10-29 MED ORDER — FULVESTRANT 250 MG/5ML IM SOLN
INTRAMUSCULAR | Status: AC
  Filled 2018-10-29: qty 5

## 2018-10-29 MED ORDER — LANREOTIDE ACETATE 120 MG/0.5ML ~~LOC~~ SOLN
SUBCUTANEOUS | Status: AC
  Filled 2018-10-29: qty 120

## 2018-10-29 MED ORDER — SODIUM CHLORIDE 0.9% FLUSH: 10.0000 mL | INTRAVENOUS | Status: DC | PRN

## 2018-10-29 MED ORDER — OCTREOTIDE ACETATE 30 MG IM KIT
PACK | INTRAMUSCULAR | Status: AC
  Filled 2018-10-29: qty 1

## 2018-10-29 MED ORDER — SODIUM CHLORIDE 0.9 % IV SOLN
Freq: Once | INTRAVENOUS | Status: AC
  Administered 2018-10-29: 09:00:00 via INTRAVENOUS

## 2018-10-29 NOTE — Progress Notes (Signed)
Patient tolerated chemotherapy with no complaints voiced.  Port site clean and dry with no bruising or swelling noted at site.  Good blood return noted before and after administration of chemotherapy.  Dressing intact.   Patient left ambulatory with VSS and no s/s of distress noted.

## 2018-10-29 NOTE — Progress Notes (Signed)
Pt presents today for Day 2 VP16.  VSS. VS reviewed with Dr. Delton Coombes. VO to give a 552ml bolus of NS received. BP 95/51. Pt asymptomatic. Verapamil/ Covera  decreased to half at bedtime per Dr. Delton Coombes. Proceed with treatment order received.

## 2018-10-29 NOTE — Progress Notes (Signed)
Patients blood pressure rechecked twice after completion of treatment.  Patient denied dizziness, weakness, or fatigue.  Stated he felt "fine" and left ambulatory with no s/s of distress noted.

## 2018-10-30 ENCOUNTER — Other Ambulatory Visit: Payer: Self-pay

## 2018-10-30 ENCOUNTER — Inpatient Hospital Stay (HOSPITAL_COMMUNITY): Payer: Medicare Other

## 2018-10-30 ENCOUNTER — Encounter (HOSPITAL_COMMUNITY): Payer: Self-pay | Admitting: Hematology

## 2018-10-30 ENCOUNTER — Inpatient Hospital Stay (HOSPITAL_BASED_OUTPATIENT_CLINIC_OR_DEPARTMENT_OTHER): Payer: Medicare Other | Admitting: Hematology

## 2018-10-30 VITALS — BP 117/68 | HR 71 | Temp 97.9°F | Resp 18

## 2018-10-30 DIAGNOSIS — C7931 Secondary malignant neoplasm of brain: Secondary | ICD-10-CM

## 2018-10-30 DIAGNOSIS — I10 Essential (primary) hypertension: Secondary | ICD-10-CM

## 2018-10-30 DIAGNOSIS — C771 Secondary and unspecified malignant neoplasm of intrathoracic lymph nodes: Secondary | ICD-10-CM

## 2018-10-30 DIAGNOSIS — C3491 Malignant neoplasm of unspecified part of right bronchus or lung: Secondary | ICD-10-CM | POA: Diagnosis not present

## 2018-10-30 DIAGNOSIS — Z5112 Encounter for antineoplastic immunotherapy: Secondary | ICD-10-CM | POA: Diagnosis not present

## 2018-10-30 MED ORDER — SODIUM CHLORIDE 0.9 % IV SOLN
Freq: Once | INTRAVENOUS | Status: AC
  Administered 2018-10-30: 10:00:00 via INTRAVENOUS

## 2018-10-30 MED ORDER — SODIUM CHLORIDE 0.9 % IV SOLN
97.0000 mg/m2 | Freq: Once | INTRAVENOUS | Status: AC
  Administered 2018-10-30: 200 mg via INTRAVENOUS
  Filled 2018-10-30: qty 10

## 2018-10-30 MED ORDER — HEPARIN SOD (PORK) LOCK FLUSH 100 UNIT/ML IV SOLN
500.0000 [IU] | Freq: Once | INTRAVENOUS | Status: AC | PRN
  Administered 2018-10-30: 500 [IU]

## 2018-10-30 MED ORDER — SODIUM CHLORIDE 0.9 % IV SOLN
10.0000 mg | Freq: Once | INTRAVENOUS | Status: AC
  Administered 2018-10-30: 10 mg via INTRAVENOUS
  Filled 2018-10-30: qty 10

## 2018-10-30 MED ORDER — SODIUM CHLORIDE 0.9% FLUSH: 10.0000 mL | INTRAVENOUS | Status: DC | PRN

## 2018-10-30 NOTE — Progress Notes (Signed)
No new issues today. Day 3 today of treatment. Proceed today per MD.   Treatment given per orders. Patient tolerated it well without problems. Vitals stable and discharged home from clinic ambulatory. Follow up as scheduled.

## 2018-10-30 NOTE — Progress Notes (Signed)
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   CURRENT THERAPY: Carboplatin, etoposide, and Imfinzi    BRIEF ONCOLOGIC HISTORY:    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[facility])   08/22/2018 Initial Diagnosis    Small cell carcinoma of lung, right ([place])    08/23/2018 -  Chemotherapy    The patient had palonosetron (ALOXI) injection 0.25 mg, 0.25 mg, Intravenous,  Once, 4 of 4 cycles Administration: 0.25 mg (08/23/2018), 0.25 mg (09/16/2018), 0.25 mg (10/07/2018), 0.25 mg (10/28/2018) pegfilgrastim-cbqv (UDENYCA) injection 6 mg, 6 mg, Subcutaneous, Once, 4 of 4 cycles Administration: 6 mg (08/27/2018), 6 mg (09/20/2018), 6 mg (10/11/2018) CARBOplatin (PARAPLATIN) 560 mg in sodium chloride 0.9 % 250 mL chemo infusion, 560 mg (100 % of original dose 564 mg), Intravenous,  Once, 4 of 4 cycles Dose modification:   (original dose 564 mg, Cycle 1),   (original dose 523.5 mg, Cycle 3),   (original dose 523.5 mg, Cycle 4) Administration: 560 mg (08/23/2018), 560 mg (09/16/2018), 520 mg (10/07/2018), 510 mg (10/28/2018) etoposide (VEPESID) 220 mg in sodium chloride 0.9 % 1,000 mL chemo infusion, 100 mg/m2 = 220 mg, Intravenous,  Once, 4 of 4 cycles Administration: 220 mg (08/23/2018), 200 mg (09/16/2018), 200 mg (09/17/2018), 200 mg (09/18/2018), 200 mg (10/07/2018), 200 mg (10/08/2018), 200 mg (10/09/2018), 200 mg (10/28/2018), 200 mg (10/29/2018) fosaprepitant (EMEND) 150 mg, dexamethasone (DECADRON) 12 mg in sodium chloride 0.9 % 145 mL IVPB, , Intravenous,  Once, 4 of 4 cycles Administration:  (08/23/2018),  (09/16/2018),  (10/07/2018),  (10/28/2018) durvalumab (IMFINZI) 1,500 mg in sodium chloride 0.9 % 100 mL chemo infusion, 1,500 mg (100 % of  original dose 1,500 mg), Intravenous,  Once, 3 of 3 cycles Dose modification: 1,500 mg (original dose 1,500 mg, Cycle 2, Reason: Provider Judgment) Administration: 1,500 mg (09/16/2018), 1,500 mg (10/07/2018), 1,500 mg (10/28/2018)  for chemotherapy treatment.      Small cell carcinoma of lung, right ([facility])   08/23/2018 -  Chemotherapy    The patient had palonosetron (ALOXI) injection 0.25 mg, 0.25 mg, Intravenous,  Once, 4 of 4 cycles Administration: 0.25 mg (08/23/2018), 0.25 mg (09/16/2018), 0.25 mg (10/07/2018), 0.25 mg (10/28/2018) pegfilgrastim-cbqv (UDENYCA) injection 6 mg, 6 mg, Subcutaneous, Once, 4 of 4 cycles Administration: 6 mg (08/27/2018), 6 mg (09/20/2018), 6 mg (10/11/2018) CARBOplatin (PARAPLATIN) 560 mg in sodium chloride 0.9 % 250 mL chemo infusion, 560 mg (100 % of original dose 564 mg), Intravenous,  Once, 4 of 4 cycles Dose modification:   (original dose 564 mg, Cycle 1),   (original dose 523.5 mg, Cycle 3),   (original dose 523.5 mg, Cycle 4) Administration: 560 mg (08/23/2018), 560 mg (09/16/2018), 520 mg (10/07/2018), 510 mg (10/28/2018) etoposide (VEPESID) 220 mg in sodium chloride 0.9 % 1,000 mL chemo infusion, 100 mg/m2 = 220 mg, Intravenous,  Once, 4 of 4 cycles Administration: 220 mg (08/23/2018), 200 mg (09/16/2018), 200 mg (09/17/2018), 200 mg (09/18/2018), 200 mg (10/07/2018), 200 mg (10/08/2018), 200 mg (10/09/2018), 200 mg (10/28/2018), 200 mg (10/29/2018) fosaprepitant (EMEND) 150 mg, dexamethasone (DECADRON) 12 mg in sodium chloride 0.9 % 145 mL IVPB, , Intravenous,  Once, 4 of 4 cycles Administration:  (08/23/2018),  (09/16/2018),  (10/07/2018),  (10/28/2018) durvalumab (IMFINZI) 1,500 mg in sodium chloride  0.9 % 100 mL chemo infusion, 1,500 mg (100 % of original dose 1,500 mg), Intravenous,  Once, 3 of 3 cycles Dose modification: 1,500 mg (original dose 1,500 mg, Cycle 2, Reason: Provider Judgment) Administration: 1,500 mg (09/16/2018), 1,500 mg (10/07/2018), 1,500 mg (10/28/2018)  for  chemotherapy treatment.     08/26/2018 Initial Diagnosis    Small cell carcinoma of lung, right [facility])      CANCER STAGING: Cancer Staging No matching staging information was found for the patient.   INTERVAL HISTORY:  Mr. Marano 73 y.o. male returns for routine follow-up and consideration for next cycle of chemotherapy. He is here today alone. He states that he is getting better slowly, but he's getting there. He states that he has diarrhea and constipation, alternating a few days one a few days the other. Denies any nausea, or vomiting . Denies any new pains. Had not noticed any recent bleeding such as epistaxis, hematuria or hematochezia. Denies recent chest pain on exertion, pre-syncopal episodes, or palpitations. Denies any numbness or tingling in hands or feet. Denies any recent fevers, infections, or recent hospitalizations. Patient reports appetite at 50-75% and energy level at 50%.     REVIEW OF SYSTEMS:  Review of Systems  Constitutional: Positive for fatigue.  Respiratory: Positive for cough and shortness of breath.   Gastrointestinal: Positive for constipation, diarrhea and nausea.  Hematological: Bruises/bleeds easily.  Psychiatric/Behavioral: Positive for sleep disturbance.     PAST MEDICAL/SURGICAL HISTORY:  Past Medical History:  Diagnosis Date  . Actinic keratosis   . Arthritis   . Colitis MAY 2012    CT ABD/PELVIS HEP FLEXURE  . COPD (chronic obstructive pulmonary disease) ([facility])   . Diabetes mellitus without complication ([place])   . History of kidney stones   . Hyperlipemia   . Hypertension   . NSAID long-term use NAPROXEN FOR OA  . SCL Ca dx'd 08/2018   Past Surgical History:  Procedure Laterality Date  . BACK SURGERY     spinal stenosis  . BASAL CELL CARCINOMA EXCISION  FACE, arms feet, leg  . CHOLECYSTECTOMY  JUNE 2011 MJ   STONES, PANCREATITIS  . KNEE ARTHROSCOPY WITH MEDIAL MENISECTOMY Right 03/24/2015   Procedure: KNEE ARTHROSCOPY WITH MEDIAL  MENISECTOMY;  Surgeon: Carole Civil, MD;  Location: [facility];  Service: Orthopedics;  Laterality: Right;  . KNEE SURGERY Left LEFT   arthroscopy  . LITHOTRIPSY  80s  . PORTACATH PLACEMENT Left 09/11/2018   Procedure: INSERTION PORT-A-CATH;  Surgeon: Virl Cagey, MD;  Location: [facility];  Service: General;  Laterality: Left;  . SPINE SURGERY       SOCIAL HISTORY:  Social History   Socioeconomic History  . Marital status: Divorced    Spouse name: Not on file  . Number of children: 0  . Years of education: Not on file  . Highest education level: Not on file  Occupational History  . Occupation: [place] [employer]   . Occupation: Manufacturing engineer   Social Needs  . Financial resource strain: Not hard at all  . Food insecurity:    Worry: Never true    Inability: Never true  . Transportation needs:    Medical: No    Non-medical: No  Tobacco Use  . Smoking status: Former Smoker    Packs/day: 1.00    Years: 50.00    Pack years: 50.00    Types: Cigarettes    Last attempt to quit: 08/07/2018    Years since quitting: 0.2  . Smokeless  tobacco: Never Used  Substance and Sexual Activity  . Alcohol use: Yes    Comment: once a month  . Drug use: No  . Sexual activity: Never    Birth control/protection: None  Lifestyle  . Physical activity:    Days per week: 0 days    Minutes per session: 0 min  . Stress: Not at all  Relationships  . Social connections:    Talks on phone: Never    Gets together: More than three times a week    Attends religious service: Never    Active member of club or organization: Yes    Attends meetings of clubs or organizations: More than 4 times per year    Relationship status: Divorced  . Intimate partner violence:    Fear of current or ex partner: No    Emotionally abused: No    Physically abused: No    Forced sexual activity: No  Other Topics Concern  . 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 Used to work  for [employer].    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    FAMILY HISTORY:  Family History  Problem Relation Age of Onset  . Cancer Mother        lung  . Cancer Father        lung and liver  . Colon polyps Neg Hx   . Colon cancer Neg Hx     CURRENT MEDICATIONS:  Outpatient Encounter Medications as of 10/30/2018  Medication Sig  . albuterol (PROVENTIL HFA;VENTOLIN HFA) 108 (90 Base) MCG/ACT inhaler INHALE 2 PUFFS INTO THE LUNGS QID  . aspirin 81 MG tablet Take 81 mg by mouth daily.    Marland Kitchen CARBOPLATIN IV Inject into the vein every 21 ( twenty-one) days.  . Cholecalciferol 50 MCG (2000 UT) TBDP Take 1 tablet by mouth daily.  [place] Oris (IMFINZI IV) Inject into the vein every 21 ( twenty-one) days.  . ETOPOSIDE IV Inject into the vein every 21 ( twenty-one) days.  Marland Kitchen HYDROcodone-acetaminophen (NORCO) 5-325 MG tablet Take 1 tablet by mouth every 4 (four) hours as needed for moderate pain.  Marland Kitchen levofloxacin (LEVAQUIN) 500 MG tablet Take 1 tablet (500 mg total) by mouth daily.  . metFORMIN (GLUCOPHAGE) 500 MG tablet Take 500 mg by mouth 2 (two) times daily with a meal.  . Multiple Vitamin (MULTIVITAMIN) tablet Take 1 tablet by mouth daily.  . ondansetron (ZOFRAN) 8 MG tablet Take 1 tablet (8 mg total) by mouth every 8 (eight) hours as needed for nausea or vomiting.  . rosuvastatin (CRESTOR) 10 MG tablet Take 10 mg by mouth daily.    Marland Kitchen scopolamine (TRANSDERM-SCOP) 1 MG/3DAYS Place 1 patch (1.5 mg total) onto the skin every 3 (three) days.  . sucralfate (CARAFATE) 1 g tablet Take 1 tablet (1 g total) by mouth 4 (four) times daily.  Marland Kitchen telmisartan-hydrochlorothiazide (MICARDIS HCT) 40-12.5 MG per tablet Take 1 tablet by mouth daily.    . verapamil (COVERA HS) 240 MG (CO) 24 hr tablet Take 240 mg by mouth at bedtime.     No facility-administered encounter medications on file as of 10/30/2018.     ALLERGIES:  No Known Allergies   PHYSICAL EXAM:  ECOG Performance status: 1  Vitals:   10/30/18 0930   BP: 113/75  Pulse: 87  Resp: 18  Temp: (!) 97 F (36.1 C)  SpO2: 97%   Filed Weights   10/30/18 0930  Weight: 187  lb (84.8 kg)    Physical Exam Constitutional:      Appearance: Normal appearance.  Cardiovascular:     Rate and Rhythm: Normal rate and regular rhythm.     Heart sounds: Normal heart sounds.  Pulmonary:     Effort: Pulmonary effort is normal.     Breath sounds: Normal breath sounds.  Abdominal:     Palpations: Abdomen is soft. There is no mass.  Musculoskeletal:        General: No swelling.  Skin:    General: Skin is warm.  Neurological:     General: No focal deficit present.     Mental Status: He is alert and oriented to person, place, and time.  Psychiatric:        Mood and Affect: Mood normal.        Behavior: Behavior normal.      LABORATORY DATA:  I have reviewed the labs as listed.  CBC    Component Value Date/Time   WBC 7.8 10/28/2018 0757   RBC 3.49 (L) 10/28/2018 0757   HGB 11.3 (L) 10/28/2018 0757   HCT 34.1 (L) 10/28/2018 0757   PLT 280 10/28/2018 0757   MCV 97.7 10/28/2018 0757   MCH 32.4 10/28/2018 0757   MCHC 33.1 10/28/2018 0757   RDW 19.3 (H) 10/28/2018 0757   LYMPHSABS 1.0 10/28/2018 0757   MONOABS 0.8 10/28/2018 0757   EOSABS 0.0 10/28/2018 0757   BASOSABS 0.0 10/28/2018 0757   CMP Latest Ref Rng & Units 10/28/2018 10/07/2018 09/16/2018  Glucose 70 - 99 mg/dL 107(H) 136(H) 144(H)  BUN 8 - 23 mg/dL 11 10 13   Creatinine 0.61 - 1.24 mg/dL 0.67 0.75 0.97  Sodium 135 - 145 mmol/L 137 137 136  Potassium 3.5 - 5.1 mmol/L 4.0 5.1 4.9  Chloride 98 - 111 mmol/L 103 101 99  CO2 22 - 32 mmol/L 23 27 27   Calcium 8.9 - 10.3 mg/dL 9.3 9.2 9.4  Total Protein 6.5 - 8.1 g/dL 6.6 6.5 7.1  Total Bilirubin 0.3 - 1.2 mg/dL 0.6 0.2(L) 0.5  Alkaline Phos 38 - 126 U/L 80 86 95  AST 15 - 41 U/L 24 21 22   ALT 0 - 44 U/L 30 19 26        DIAGNOSTIC IMAGING:  I have independently reviewed the scans and discussed with the patient.   I have  reviewed Venita Lick LPN's note and agree with the documentation.  I personally performed a face-to-face visit, made revisions and my assessment and plan is as follows.    ASSESSMENT & PLAN:   Small cell carcinoma of lung, right ([place]) 1.  Extensive stage small cell lung cancer: -Patient noted bilateral axillary adenopathy starting in October 2019. - He was evaluated at [facility] with a biopsy of the axillary lymph node.  He was also diagnosed with small pulmonary embolism and was started on Eliquis. - On 08/21/2018, he presented to the ER with shortness of breath.  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 -He was admitted to [facility] long and was treated with first cycle of chemotherapy with carboplatin and VP-16 on 08/23/2018 along with radiation to the chest.  -Cycle 2 on 09/16/2018 and cycle 3 on 10/07/2018.  We have added durvalumab during cycle 2. - He is tolerating chemotherapy very well.  Denied any immunotherapy related side effects. - I reviewed results of the CT CAP dated 10/24/2018 which showed marked interval decrease in bulk of mediastinal, supraclavicular and axillary adenopathy. -Physical examination revealed decrease  in size of bilateral axillary adenopathy and left-sided neck adenopathy. - He will proceed with cycle 4.  He will come back in 3 weeks for follow-up.  I plan to change his treatment to durvalumab 1200 mg maintenance every 28 days at that point. -We will plan to repeat scans in 2 to 3 months.  2.  Brain metastasis: - MRI of the brain on 08/22/2018 showed 3 small subcentimeter metastasis in the right insula, right occipital lobe and right cerebellar hemisphere. - WBRT from 09/02/2018 through 09/13/2018. -I reviewed results of the MRI of the brain on 10/24/2018 which shows complete response to therapy with 3 mets showing resolution.  Only tiny residual T2 focus seen.  3.  Hypertension: -He had episodes of dizziness.  We discontinued his Micardis.  Blood pressure today is  113/75. -Dizziness also improved.  He is continue verapamil 240 mg every day.       Orders placed this encounter:  No orders of the defined types were placed in this encounter.     Derek Jack, MD [facility] [phone]

## 2018-10-30 NOTE — Assessment & Plan Note (Addendum)
1.  Extensive stage small cell lung cancer: -Patient noted bilateral axillary adenopathy starting in October 2019. - He was evaluated at [facility] with a biopsy of the axillary lymph node.  He was also diagnosed with small pulmonary embolism and was started on Eliquis. - On 08/21/2018, he presented to the ER with shortness of breath.  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 -He was admitted to [facility] long and was treated with first cycle of chemotherapy with carboplatin and VP-16 on 08/23/2018 along with radiation to the chest.  -Cycle 2 on 09/16/2018 and cycle 3 on 10/07/2018.  We have added durvalumab during cycle 2. - He is tolerating chemotherapy very well.  Denied any immunotherapy related side effects. - I reviewed results of the CT CAP dated 10/24/2018 which showed marked interval decrease in bulk of mediastinal, supraclavicular and axillary adenopathy. -Physical examination revealed decrease in size of bilateral axillary adenopathy and left-sided neck adenopathy. - He will proceed with cycle 4.  He will come back in 3 weeks for follow-up.  I plan to change his treatment to durvalumab 1200 mg maintenance every 28 days at that point. -We will plan to repeat scans in 2 to 3 months.  2.  Brain metastasis: - MRI of the brain on 08/22/2018 showed 3 small subcentimeter metastasis in the right insula, right occipital lobe and right cerebellar hemisphere. - WBRT from 09/02/2018 through 09/13/2018. -I reviewed results of the MRI of the brain on 10/24/2018 which shows complete response to therapy with 3 mets showing resolution.  Only tiny residual T2 focus seen.  3.  Hypertension: -He had episodes of dizziness.  We discontinued his Micardis.  Blood pressure today is 113/75. -Dizziness also improved.  He is continue verapamil 240 mg every day.

## 2018-10-30 NOTE — Patient Instructions (Signed)
[facility] Discharge Instructions for Patients Receiving Chemotherapy  Today you received the following chemotherapy agents   To help prevent nausea and vomiting after your treatment, we encourage you to take your nausea medication   If you develop nausea and vomiting that is not controlled by your nausea medication, call the clinic.   BELOW ARE SYMPTOMS THAT SHOULD BE REPORTED IMMEDIATELY:  *FEVER GREATER THAN 100.5 F  *CHILLS WITH OR WITHOUT FEVER  NAUSEA AND VOMITING THAT IS NOT CONTROLLED WITH YOUR NAUSEA MEDICATION  *UNUSUAL SHORTNESS OF BREATH  *UNUSUAL BRUISING OR BLEEDING  TENDERNESS IN MOUTH AND THROAT WITH OR WITHOUT PRESENCE OF ULCERS  *URINARY PROBLEMS  *BOWEL PROBLEMS  UNUSUAL RASH Items with * indicate a potential emergency and should be followed up as soon as possible.  Feel free to call the clinic should you have any questions or concerns. The clinic phone number is [phone].  Please show the CHEMO ALERT CARD at check-in to the Emergency Department and triage nurse.   

## 2018-10-30 NOTE — Patient Instructions (Addendum)
[place] at [facility] Discharge Instructions  You were seen today by Dr. Delton Coombes. He went over your recent scan and lab results they were good. He will see you back in 3 weeks for labs, treatment and follow up.   Thank you for choosing [place] at [facility] to provide your oncology and hematology care.  To afford each patient quality time with our provider, please arrive at least 15 minutes before your scheduled appointment time.   If you have a lab appointment with the [facility] please come in thru the  Main Entrance and check in at the main information desk  You need to re-schedule your appointment should you arrive 10 or more minutes late.  We strive to give you quality time with our providers, and arriving late affects you and other patients whose appointments are after yours.  Also, if you no show three or more times for appointments you may be dismissed from the clinic at the providers discretion.     Again, thank you for choosing [facility].  Our hope is that these requests will decrease the amount of time that you wait before being seen by our physicians.       _____________________________________________________________  Should you have questions after your visit to [facility], please contact our office at [phone] between the hours of 8:00 a.m. and 4:30 p.m.  Voicemails left after 4:00 p.m. will not be returned until the following business day.  For prescription refill requests, have your pharmacy contact our office and allow 72 hours.    Cancer Center Support Programs:   > Cancer Support Group  2nd Tuesday of the month 1pm-2pm, Journey Room

## 2018-11-01 ENCOUNTER — Inpatient Hospital Stay (HOSPITAL_COMMUNITY): Payer: Medicare Other

## 2018-11-01 ENCOUNTER — Other Ambulatory Visit: Payer: Self-pay

## 2018-11-01 ENCOUNTER — Ambulatory Visit (HOSPITAL_COMMUNITY): Payer: Medicare Other

## 2018-11-01 ENCOUNTER — Encounter (HOSPITAL_COMMUNITY): Payer: Self-pay

## 2018-11-01 VITALS — BP 89/67 | HR 99 | Temp 97.6°F | Resp 18

## 2018-11-01 DIAGNOSIS — Z5112 Encounter for antineoplastic immunotherapy: Secondary | ICD-10-CM | POA: Diagnosis not present

## 2018-11-01 DIAGNOSIS — C3491 Malignant neoplasm of unspecified part of right bronchus or lung: Secondary | ICD-10-CM

## 2018-11-01 DIAGNOSIS — C771 Secondary and unspecified malignant neoplasm of intrathoracic lymph nodes: Secondary | ICD-10-CM

## 2018-11-01 MED ORDER — PEGFILGRASTIM-CBQV 6 MG/0.6ML ~~LOC~~ SOSY
6.0000 mg | PREFILLED_SYRINGE | Freq: Once | SUBCUTANEOUS | Status: AC
  Administered 2018-11-01: 6 mg via SUBCUTANEOUS

## 2018-11-01 NOTE — Progress Notes (Signed)
Pt a&Ox4; in no apparent distress. Ambulatory w/o assistance w/steady gait. Recheck BP 88/63 - denies dizziness/lightheadedness, shortness of breath.  Reports he has "cut back" on his blood pressure medication due to his BP running low.

## 2018-11-04 ENCOUNTER — Other Ambulatory Visit: Payer: Self-pay | Admitting: Radiation Therapy

## 2018-11-04 DIAGNOSIS — C7949 Secondary malignant neoplasm of other parts of nervous system: Principal | ICD-10-CM

## 2018-11-04 DIAGNOSIS — C7931 Secondary malignant neoplasm of brain: Secondary | ICD-10-CM

## 2018-11-20 ENCOUNTER — Encounter (HOSPITAL_COMMUNITY): Payer: Self-pay | Admitting: Hematology

## 2018-11-20 ENCOUNTER — Other Ambulatory Visit: Payer: Self-pay

## 2018-11-20 ENCOUNTER — Inpatient Hospital Stay (HOSPITAL_COMMUNITY): Payer: Medicare Other

## 2018-11-20 ENCOUNTER — Inpatient Hospital Stay (HOSPITAL_COMMUNITY): Payer: Medicare Other | Attending: Hematology | Admitting: Hematology

## 2018-11-20 VITALS — BP 102/78 | HR 80 | Temp 97.6°F | Resp 18

## 2018-11-20 DIAGNOSIS — C3491 Malignant neoplasm of unspecified part of right bronchus or lung: Secondary | ICD-10-CM

## 2018-11-20 DIAGNOSIS — Z5112 Encounter for antineoplastic immunotherapy: Secondary | ICD-10-CM | POA: Diagnosis not present

## 2018-11-20 DIAGNOSIS — C7931 Secondary malignant neoplasm of brain: Secondary | ICD-10-CM | POA: Diagnosis not present

## 2018-11-20 DIAGNOSIS — I10 Essential (primary) hypertension: Secondary | ICD-10-CM | POA: Insufficient documentation

## 2018-11-20 DIAGNOSIS — I2699 Other pulmonary embolism without acute cor pulmonale: Secondary | ICD-10-CM | POA: Diagnosis not present

## 2018-11-20 DIAGNOSIS — Z7901 Long term (current) use of anticoagulants: Secondary | ICD-10-CM | POA: Diagnosis not present

## 2018-11-20 LAB — COMPREHENSIVE METABOLIC PANEL
ALT: 16 U/L (ref 0–44)
AST: 20 U/L (ref 15–41)
Albumin: 3.9 g/dL (ref 3.5–5.0)
Alkaline Phosphatase: 84 U/L (ref 38–126)
Anion gap: 10 (ref 5–15)
BUN: 12 mg/dL (ref 8–23)
CO2: 24 mmol/L (ref 22–32)
Calcium: 9.3 mg/dL (ref 8.9–10.3)
Chloride: 100 mmol/L (ref 98–111)
Creatinine, Ser: 0.59 mg/dL — ABNORMAL LOW (ref 0.61–1.24)
GFR calc Af Amer: >60 mL/min (ref >60)
GFR calc non Af Amer: >60 mL/min (ref >60)
Glucose, Bld: 177 mg/dL — ABNORMAL HIGH (ref 70–99)
Potassium: 4 mmol/L (ref 3.5–5.1)
Sodium: 134 mmol/L — ABNORMAL LOW (ref 135–145)
Total Bilirubin: 0.8 mg/dL (ref 0.3–1.2)
Total Protein: 6.7 g/dL (ref 6.5–8.1)

## 2018-11-20 LAB — CBC WITH DIFFERENTIAL/PLATELET
Abs Immature Granulocytes: 0.08 10*3/uL — ABNORMAL HIGH (ref 0.00–0.07)
Basophils Absolute: 0.1 10*3/uL (ref 0.0–0.1)
Basophils Relative: 1 %
Eosinophils Absolute: 0 10*3/uL (ref 0.0–0.5)
Eosinophils Relative: 0 %
HCT: 33.3 % — ABNORMAL LOW (ref 39.0–52.0)
Hemoglobin: 11.1 g/dL — ABNORMAL LOW (ref 13.0–17.0)
Immature Granulocytes: 1 %
Lymphocytes Relative: 8 %
Lymphs Abs: 0.9 10*3/uL (ref 0.7–4.0)
MCH: 34 pg (ref 26.0–34.0)
MCHC: 33.3 g/dL (ref 30.0–36.0)
MCV: 102.1 fL — ABNORMAL HIGH (ref 80.0–100.0)
Monocytes Absolute: 0.8 10*3/uL (ref 0.1–1.0)
Monocytes Relative: 7 %
Neutro Abs: 8.9 10*3/uL — ABNORMAL HIGH (ref 1.7–7.7)
Neutrophils Relative %: 83 %
Platelets: 291 10*3/uL (ref 150–400)
RBC: 3.26 MIL/uL — ABNORMAL LOW (ref 4.22–5.81)
RDW: 19 % — ABNORMAL HIGH (ref 11.5–15.5)
WBC: 10.6 10*3/uL — ABNORMAL HIGH (ref 4.0–10.5)
nRBC: 0 % (ref 0.0–0.2)

## 2018-11-20 LAB — MAGNESIUM: Magnesium: 2 mg/dL (ref 1.7–2.4)

## 2018-11-20 MED ORDER — SODIUM CHLORIDE 0.9 % IV SOLN
Freq: Once | INTRAVENOUS | Status: AC
  Administered 2018-11-20: 12:00:00 via INTRAVENOUS

## 2018-11-20 MED ORDER — SODIUM CHLORIDE 0.9 % IV SOLN
1500.0000 mg | Freq: Once | INTRAVENOUS | Status: AC
  Administered 2018-11-20: 1500 mg via INTRAVENOUS
  Filled 2018-11-20: qty 30

## 2018-11-20 MED ORDER — SODIUM CHLORIDE 0.9% FLUSH
10.0000 mL | INTRAVENOUS | Status: DC | PRN
  Administered 2018-11-20: 10 mL
  Filled 2018-11-20: qty 10

## 2018-11-20 MED ORDER — HEPARIN SOD (PORK) LOCK FLUSH 100 UNIT/ML IV SOLN
500.0000 [IU] | Freq: Once | INTRAVENOUS | Status: AC | PRN
  Administered 2018-11-20: 14:00:00 500 [IU]

## 2018-11-20 NOTE — Progress Notes (Signed)
1130 Labs reviewed with and pt seen by Dr. Delton Coombes and pt approved for Imfinzi infusion per MD  [name] tolerated Imfinzi infusion well without complaints or incident. VSS upon discharge. Pt discharged self ambulatory in satisfactory condition

## 2018-11-20 NOTE — Assessment & Plan Note (Signed)
1.  Extensive stage small cell lung cancer: -Patient noted bilateral axillary adenopathy starting in October 2019. - He was evaluated at [facility] with a biopsy of the axillary lymph node.  He was also diagnosed with small pulmonary embolism and was started on Eliquis. - On 08/21/2018, he presented to the ER with shortness of breath.  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 -4 cycles of carboplatin, VP-16 and durvalumab from 08/23/2018 through 10/28/2018 with durvalumab added during cycle 2. - CT CAP on 10/24/2018 shows marked interval decrease in bulk of mediastinal, supraclavicular and axillary adenopathy. -Physical exam revealed decrease in size of bilateral axillary adenopathy and left neck adenopathy. -He is complaining of feeling tired with chemotherapy.  Hence I have recommended stopping with 4 cycles.  We will will continue maintenance durvalumab 1500 mg flat dose every 28 days. -We discussed immunotherapy related side effects in detail.  He did not develop any yet. - He may proceed with his maintenance durvalumab today.  He will be seen back in 28 days. -I plan to repeat scans in 3 months.  2.  Brain metastasis: - MRI of the brain on 08/22/2018 showed 3 small subcentimeter metastasis in the right insula, right occipital lobe and right cerebellar hemisphere. - WBRT from 09/02/2018 through 09/13/2018. -MRI of the brain on 10/24/2018 shows complete response to therapy with 3 mets showing resolution.  Only tiny residual T2 focus seen.   3.  Hypertension: -Micardis was discontinued.  Blood pressure today is 117/78. -He will continue verapamil daily.

## 2018-11-20 NOTE — Patient Instructions (Signed)
[place] at [facility] Discharge Instructions  You were seen today by Dr. Delton Coombes. He went over your recent lab results. He will see you back in 4 weeks for labs, treatment and follow up.   Thank you for choosing [name] at [facility] to provide your oncology and hematology care.  To afford each patient quality time with our provider, please arrive at least 15 minutes before your scheduled appointment time.   If you have a lab appointment with the [facility] please come in thru the  Main Entrance and check in at the main information desk  You need to re-schedule your appointment should you arrive 10 or more minutes late.  We strive to give you quality time with our providers, and arriving late affects you and other patients whose appointments are after yours.  Also, if you no show three or more times for appointments you may be dismissed from the clinic at the providers discretion.     Again, thank you for choosing [facility].  Our hope is that these requests will decrease the amount of time that you wait before being seen by our physicians.       _____________________________________________________________  Should you have questions after your visit to [facility], please contact our office at [phone] between the hours of 8:00 a.m. and 4:30 p.m.  Voicemails left after 4:00 p.m. will not be returned until the following business day.  For prescription refill requests, have your pharmacy contact our office and allow 72 hours.    Cancer Center Support Programs:   > Cancer Support Group  2nd Tuesday of the month 1pm-2pm, Journey Room

## 2018-11-20 NOTE — Patient Instructions (Signed)
[facility] Discharge Instructions for Patients Receiving Chemotherapy   Beginning January 23rd 2017 lab work for the [facility] will be done in the  Main lab at [facility] on 1st floor. If you have a lab appointment with the [facility] please come in thru the  Main Entrance and check in at the main information desk   Today you received the following chemotherapy agents Imfinzi. Follow-up as scheduled. Call clinic for any questions or concerns  To help prevent nausea and vomiting after your treatment, we encourage you to take your nausea medication   If you develop nausea and vomiting, or diarrhea that is not controlled by your medication, call the clinic.  The clinic phone number is [phone]. Office hours are Monday-Friday 8:30am-5:00pm.  BELOW ARE SYMPTOMS THAT SHOULD BE REPORTED IMMEDIATELY:  *FEVER GREATER THAN 101.0 F  *CHILLS WITH OR WITHOUT FEVER  NAUSEA AND VOMITING THAT IS NOT CONTROLLED WITH YOUR NAUSEA MEDICATION  *UNUSUAL SHORTNESS OF BREATH  *UNUSUAL BRUISING OR BLEEDING  TENDERNESS IN MOUTH AND THROAT WITH OR WITHOUT PRESENCE OF ULCERS  *URINARY PROBLEMS  *BOWEL PROBLEMS  UNUSUAL RASH Items with * indicate a potential emergency and should be followed up as soon as possible. If you have an emergency after office hours please contact your primary care physician or go to the nearest emergency department.  Please call the clinic during office hours if you have any questions or concerns.   You may also contact the Patient Navigator at [phone] should you have any questions or need assistance in obtaining follow up care.      Resources For Cancer Patients and their Caregivers ? American Cancer Society: Can assist with transportation, wigs, general needs, runs Look Good Feel Better.        769 235 8261 ? Cancer Care: Provides financial assistance, online support groups, medication/co-pay assistance.  1-800-813-HOPE  305-562-7817) ? [facility] Assists [place] cancer patients and their families through emotional , educational and financial support.  6146714500 ? [place] DSS Where to apply for food stamps, Medicaid and utility assistance. 651-561-3409 ? [facility]: Transportation to medical appointments. (647) 596-9177 ? Social Security Administration: May apply for disability if have a Stage IV cancer. [phone] 231-377-2845 ? [employer], Disability and Transit Services: Assists with nutrition, care and transit needs. [phone]

## 2018-11-20 NOTE — Progress Notes (Signed)
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[place])   08/22/2018 Initial Diagnosis    Small cell carcinoma of lung, right ([place])    08/23/2018 - 11/17/2018 Chemotherapy    The patient had palonosetron (ALOXI) injection 0.25 mg, 0.25 mg, Intravenous,  Once, 4 of 4 cycles Administration: 0.25 mg (08/23/2018), 0.25 mg (09/16/2018), 0.25 mg (10/07/2018), 0.25 mg (10/28/2018) pegfilgrastim-cbqv (UDENYCA) injection 6 mg, 6 mg, Subcutaneous, Once, 4 of 4 cycles Administration: 6 mg (08/27/2018), 6 mg (09/20/2018), 6 mg (10/11/2018), 6 mg (11/01/2018) CARBOplatin (PARAPLATIN) 560 mg in sodium chloride 0.9 % 250 mL chemo infusion, 560 mg (100 % of original dose 564 mg), Intravenous,  Once, 4 of 4 cycles Dose modification:   (original dose 564 mg, Cycle 1),   (original dose 523.5 mg, Cycle 3),   (original dose 523.5 mg, Cycle 4) Administration: 560 mg (08/23/2018), 560 mg (09/16/2018), 520 mg (10/07/2018), 510 mg (10/28/2018) etoposide (VEPESID) 220 mg in sodium chloride 0.9 % 1,000 mL chemo infusion, 100 mg/m2 = 220 mg, Intravenous,  Once, 4 of 4 cycles Administration: 220 mg (08/23/2018), 200 mg (09/16/2018), 200 mg (09/17/2018), 200 mg (09/18/2018), 200 mg (10/07/2018), 200 mg (10/08/2018), 200 mg (10/09/2018), 200 mg (10/28/2018), 200 mg (10/29/2018), 200 mg (10/30/2018) fosaprepitant (EMEND) 150 mg, dexamethasone (DECADRON) 12 mg in sodium chloride 0.9 % 145 mL IVPB, , Intravenous,  Once, 4 of 4 cycles Administration:  (08/23/2018),  (09/16/2018),  (10/07/2018),  (10/28/2018) durvalumab (IMFINZI) 1,500 mg in sodium chloride 0.9 %  100 mL chemo infusion, 1,500 mg (100 % of original dose 1,500 mg), Intravenous,  Once, 3 of 3 cycles Dose modification: 1,500 mg (original dose 1,500 mg, Cycle 2, Reason: Provider Judgment) Administration: 1,500 mg (09/16/2018), 1,500 mg (10/07/2018), 1,500 mg (10/28/2018)  for chemotherapy treatment.      Small cell carcinoma of lung, right ([place])   08/23/2018 - 11/17/2018 Chemotherapy    The patient had palonosetron (ALOXI) injection 0.25 mg, 0.25 mg, Intravenous,  Once, 4 of 4 cycles Administration: 0.25 mg (08/23/2018), 0.25 mg (09/16/2018), 0.25 mg (10/07/2018), 0.25 mg (10/28/2018) pegfilgrastim-cbqv (UDENYCA) injection 6 mg, 6 mg, Subcutaneous, Once, 4 of 4 cycles Administration: 6 mg (08/27/2018), 6 mg (09/20/2018), 6 mg (10/11/2018), 6 mg (11/01/2018) CARBOplatin (PARAPLATIN) 560 mg in sodium chloride 0.9 % 250 mL chemo infusion, 560 mg (100 % of original dose 564 mg), Intravenous,  Once, 4 of 4 cycles Dose modification:   (original dose 564 mg, Cycle 1),   (original dose 523.5 mg, Cycle 3),   (original dose 523.5 mg, Cycle 4) Administration: 560 mg (08/23/2018), 560 mg (09/16/2018), 520 mg (10/07/2018), 510 mg (10/28/2018) etoposide (VEPESID) 220 mg in sodium chloride 0.9 % 1,000 mL chemo infusion, 100 mg/m2 = 220 mg, Intravenous,  Once, 4 of 4 cycles Administration: 220 mg (08/23/2018), 200 mg (09/16/2018), 200 mg (09/17/2018), 200 mg (09/18/2018), 200 mg (10/07/2018), 200 mg (10/08/2018), 200 mg (10/09/2018), 200 mg (10/28/2018), 200 mg (10/29/2018), 200 mg (10/30/2018) fosaprepitant (EMEND) 150 mg, dexamethasone (DECADRON) 12 mg in sodium chloride 0.9 % 145 mL IVPB, , Intravenous,  Once, 4 of 4 cycles Administration:  (08/23/2018),  (09/16/2018),  (  10/07/2018),  (10/28/2018) durvalumab (IMFINZI) 1,500 mg in sodium chloride 0.9 % 100 mL chemo infusion, 1,500 mg (100 % of original dose 1,500 mg), Intravenous,  Once, 3 of 3 cycles Dose modification: 1,500 mg (original dose 1,500 mg, Cycle 2, Reason: Provider Judgment)  Administration: 1,500 mg (09/16/2018), 1,500 mg (10/07/2018), 1,500 mg (10/28/2018)  for chemotherapy treatment.     08/26/2018 Initial Diagnosis    Small cell carcinoma of lung, right ([place])    11/20/2018 -  Chemotherapy    The patient had durvalumab (IMFINZI) 1,500 mg in sodium chloride 0.9 % 100 mL chemo infusion, 1,500 mg (100 % of original dose 1,500 mg), Intravenous,  Once, 1 of 6 cycles Dose modification: 1,500 mg (original dose 1,500 mg, Cycle 1, Reason: Other (see comments)) Administration: 1,500 mg (11/20/2018)  for chemotherapy treatment.       CANCER STAGING: Cancer Staging No matching staging information was found for the patient.   INTERVAL HISTORY:  [name] 73 y.o. male returns for routine follow-up and consideration for next cycle of chemotherapy. He is here today alone. He states that he has constipation and diarrhea at times. He states that his taste has changed and nothing tastes good. He states that he is very tired and has no energy. Denies any nausea, or vomiting. Denies any new pains. Had not noticed any recent bleeding such as epistaxis, hematuria or hematochezia. Denies recent chest pain on exertion, shortness of breath on minimal exertion, pre-syncopal episodes, or palpitations. Denies any numbness or tingling in hands or feet. Denies any recent fevers, infections, or recent hospitalizations. Patient reports appetite at 25% and energy level at 25%.    REVIEW OF SYSTEMS:  Review of Systems  Constitutional: Positive for appetite change and fatigue.  Gastrointestinal: Positive for constipation and diarrhea.  Neurological: Positive for dizziness.     PAST MEDICAL/SURGICAL HISTORY:  Past Medical History:  Diagnosis Date  . Actinic keratosis   . Arthritis   . Colitis MAY 2012    CT ABD/PELVIS HEP FLEXURE  . COPD (chronic obstructive pulmonary disease) ([place])   . Diabetes mellitus without complication ([place])   . History of kidney stones   . Hyperlipemia   .  Hypertension   . NSAID long-term use NAPROXEN FOR OA  . SCL Ca dx'd 08/2018   Past Surgical History:  Procedure Laterality Date  . BACK SURGERY     spinal stenosis  . BASAL CELL CARCINOMA EXCISION  FACE, arms feet, leg  . CHOLECYSTECTOMY  JUNE 2011 MJ   STONES, PANCREATITIS  . KNEE ARTHROSCOPY WITH MEDIAL MENISECTOMY Right 03/24/2015   Procedure: KNEE ARTHROSCOPY WITH MEDIAL MENISECTOMY;  Surgeon: Carole Civil, MD;  Location: [facility];  Service: Orthopedics;  Laterality: Right;  . KNEE SURGERY Left LEFT   arthroscopy  . LITHOTRIPSY  80s  . PORTACATH PLACEMENT Left 09/11/2018   Procedure: INSERTION PORT-A-CATH;  Surgeon: Virl Cagey, MD;  Location: [facility];  Service: General;  Laterality: Left;  . SPINE SURGERY       SOCIAL HISTORY:  Social History   Socioeconomic History  . Marital status: Divorced    Spouse name: Not on file  . Number of children: 0  . Years of education: Not on file  . Highest education level: Not on file  Occupational History  . Occupation: [place] [employer]   . Occupation: Manufacturing engineer   Social Needs  . Financial resource strain: Not hard at all  . Food insecurity:    Worry: Never true  Inability: Never true  . Transportation needs:    Medical: No    Non-medical: No  Tobacco Use  . Smoking status: Former Smoker    Packs/day: 1.00    Years: 50.00    Pack years: 50.00    Types: Cigarettes    Last attempt to quit: 08/07/2018    Years since quitting: 0.2  . Smokeless tobacco: Never Used  Substance and Sexual Activity  . Alcohol use: Yes    Comment: once a month  . Drug use: No  . Sexual activity: Never    Birth control/protection: None  Lifestyle  . Physical activity:    Days per week: 0 days    Minutes per session: 0 min  . Stress: Not at all  Relationships  . Social connections:    Talks on phone: Never    Gets together: More than three times a week    Attends religious service: Never    Active member of club or organization:  Yes    Attends meetings of clubs or organizations: More than 4 times per year    Relationship status: Divorced  . Intimate partner violence:    Fear of current or ex partner: No    Emotionally abused: No    Physically abused: No    Forced sexual activity: No  Other Topics Concern  . 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    FAMILY HISTORY:  Family History  Problem Relation Age of Onset  . Cancer Mother        lung  . Cancer Father        lung and liver  . Colon polyps Neg Hx   . Colon cancer Neg Hx     CURRENT MEDICATIONS:  Outpatient Encounter Medications as of 11/20/2018  Medication Sig  . albuterol (PROVENTIL HFA;VENTOLIN HFA) 108 (90 Base) MCG/ACT inhaler Inhale 2 puffs into the lungs as needed.   Marland Kitchen aspirin 81 MG tablet Take 81 mg by mouth daily.    Marland Kitchen CARBOPLATIN IV Inject into the vein every 21 ( twenty-one) days.  . Cholecalciferol 50 MCG (2000 UT) TBDP Take 1 tablet by mouth daily.  [place] (IMFINZI IV) Inject into the vein every 21 ( twenty-one) days.  . ETOPOSIDE IV Inject into the vein every 21 ( twenty-one) days.  Marland Kitchen levofloxacin (LEVAQUIN) 750 MG tablet Take 750 mg by mouth daily. X 7 days  . metFORMIN (GLUCOPHAGE) 500 MG tablet Take 500 mg by mouth 2 (two) times daily with a meal.  . Multiple Vitamin (MULTIVITAMIN) tablet Take 1 tablet by mouth daily.  . ondansetron (ZOFRAN) 8 MG tablet Take 1 tablet (8 mg total) by mouth every 8 (eight) hours as needed for nausea or vomiting.  . rosuvastatin (CRESTOR) 10 MG tablet Take 10 mg by mouth daily.    . tamsulosin (FLOMAX) 0.4 MG CAPS capsule Take 0.4 mg by mouth daily.  Marland Kitchen HYDROcodone-acetaminophen (NORCO) 5-325 MG tablet Take 1 tablet by mouth every 4 (four) hours as needed for moderate pain. (Patient not taking: Reported on 11/20/2018)  . scopolamine (TRANSDERM-SCOP) 1 MG/3DAYS Place 1 patch (1.5 mg total) onto  the skin every 3 (three) days. (Patient not taking: Reported on 11/20/2018)  . sucralfate (CARAFATE) 1 g tablet Take 1 tablet (1 g total) by mouth 4 (four) times daily. (Patient not taking: Reported on 11/20/2018)  . telmisartan-hydrochlorothiazide (  MICARDIS HCT) 40-12.5 MG per tablet Take 1 tablet by mouth daily.    . verapamil (COVERA HS) 240 MG (CO) 24 hr tablet Take 240 mg by mouth at bedtime.     No facility-administered encounter medications on file as of 11/20/2018.     ALLERGIES:  No Known Allergies   PHYSICAL EXAM:  ECOG Performance status: 1  Vitals:   11/20/18 0938  BP: 117/78  Pulse: (!) 115  Resp: 18  Temp: (!) 97.3 F (36.3 C)  SpO2: 100%   Filed Weights   11/20/18 0938  Weight: 175 lb (79.4 kg)    Physical Exam Vitals signs reviewed.  Constitutional:      Appearance: Normal appearance.  Cardiovascular:     Rate and Rhythm: Normal rate and regular rhythm.     Heart sounds: Normal heart sounds.  Pulmonary:     Effort: Pulmonary effort is normal.     Breath sounds: Normal breath sounds.  Abdominal:     General: There is no distension.     Palpations: Abdomen is soft. There is mass.  Musculoskeletal:        General: No swelling.  Lymphadenopathy:     Cervical: Cervical adenopathy present.  Skin:    General: Skin is warm.  Neurological:     General: No focal deficit present.     Mental Status: He is alert and oriented to person, place, and time.  Psychiatric:        Mood and Affect: Mood normal.        Behavior: Behavior normal.      LABORATORY DATA:  I have reviewed the labs as listed.  CBC    Component Value Date/Time   WBC 10.6 (H) 11/20/2018 1049   RBC 3.26 (L) 11/20/2018 1049   HGB 11.1 (L) 11/20/2018 1049   HCT 33.3 (L) 11/20/2018 1049   PLT 291 11/20/2018 1049   MCV 102.1 (H) 11/20/2018 1049   MCH 34.0 11/20/2018 1049   MCHC 33.3 11/20/2018 1049   RDW 19.0 (H) 11/20/2018 1049   LYMPHSABS 0.9 11/20/2018 1049   MONOABS 0.8  11/20/2018 1049   EOSABS 0.0 11/20/2018 1049   BASOSABS 0.1 11/20/2018 1049   CMP Latest Ref Rng & Units 11/20/2018 10/28/2018 10/07/2018  Glucose 70 - 99 mg/dL 177(H) 107(H) 136(H)  BUN 8 - 23 mg/dL 12 11 10   Creatinine 0.61 - 1.24 mg/dL 0.59(L) 0.67 0.75  Sodium 135 - 145 mmol/L 134(L) 137 137  Potassium 3.5 - 5.1 mmol/L 4.0 4.0 5.1  Chloride 98 - 111 mmol/L 100 103 101  CO2 22 - 32 mmol/L 24 23 27   Calcium 8.9 - 10.3 mg/dL 9.3 9.3 9.2  Total Protein 6.5 - 8.1 g/dL 6.7 6.6 6.5  Total Bilirubin 0.3 - 1.2 mg/dL 0.8 0.6 0.2(L)  Alkaline Phos 38 - 126 U/L 84 80 86  AST 15 - 41 U/L 20 24 21   ALT 0 - 44 U/L 16 30 19        DIAGNOSTIC IMAGING:  I have independently reviewed the scans and discussed with the patient.   I have reviewed Venita Lick LPN's note and agree with the documentation.  I personally performed a face-to-face visit, made revisions and my assessment and plan is as follows.    ASSESSMENT & PLAN:   Small cell carcinoma of lung, right ([place]) 1.  Extensive stage small cell lung cancer: -Patient noted bilateral axillary adenopathy starting in October 2019. - He was evaluated at [facility] with a biopsy  of the axillary lymph node.  He was also diagnosed with small pulmonary embolism and was started on Eliquis. - On 08/21/2018, he presented to the ER with shortness of breath.  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 -4 cycles of carboplatin, VP-16 and durvalumab from 08/23/2018 through 10/28/2018 with durvalumab added during cycle 2. - CT CAP on 10/24/2018 shows marked interval decrease in bulk of mediastinal, supraclavicular and axillary adenopathy. -Physical exam revealed decrease in size of bilateral axillary adenopathy and left neck adenopathy. -He is complaining of feeling tired with chemotherapy.  Hence I have recommended stopping with 4 cycles.  We will will continue maintenance durvalumab 1500 mg flat dose every 28 days. -We discussed immunotherapy related side effects  in detail.  He did not develop any yet. - He may proceed with his maintenance durvalumab today.  He will be seen back in 28 days. -I plan to repeat scans in 3 months.  2.  Brain metastasis: - MRI of the brain on 08/22/2018 showed 3 small subcentimeter metastasis in the right insula, right occipital lobe and right cerebellar hemisphere. - WBRT from 09/02/2018 through 09/13/2018. -MRI of the brain on 10/24/2018 shows complete response to therapy with 3 mets showing resolution.  Only tiny residual T2 focus seen.   3.  Hypertension: -Micardis was discontinued.  Blood pressure today is 117/78. -He will continue verapamil daily.   Total time spent is 25 minutes with more than 50% of the time spent face-to-face discussing maintenance immunotherapy, side effects and coordination of care.    Orders placed this encounter:  No orders of the defined types were placed in this encounter.     Derek Jack, MD [place] [phone]

## 2018-11-21 ENCOUNTER — Ambulatory Visit (HOSPITAL_COMMUNITY): Payer: Medicare Other

## 2018-11-22 ENCOUNTER — Ambulatory Visit (HOSPITAL_COMMUNITY): Payer: Medicare Other

## 2018-11-25 ENCOUNTER — Ambulatory Visit (HOSPITAL_COMMUNITY): Payer: Medicare Other

## 2018-12-09 ENCOUNTER — Other Ambulatory Visit: Payer: Self-pay | Admitting: Radiation Therapy

## 2018-12-11 ENCOUNTER — Ambulatory Visit (HOSPITAL_COMMUNITY): Admission: RE | Admit: 2018-12-11 | Payer: Medicare Other | Source: Ambulatory Visit

## 2018-12-11 ENCOUNTER — Encounter (HOSPITAL_COMMUNITY): Payer: Self-pay | Admitting: General Surgery

## 2018-12-12 ENCOUNTER — Other Ambulatory Visit (HOSPITAL_COMMUNITY): Payer: Self-pay | Admitting: Radiation Oncology

## 2018-12-12 ENCOUNTER — Ambulatory Visit (HOSPITAL_COMMUNITY): Payer: Medicare Other

## 2018-12-12 DIAGNOSIS — C3490 Malignant neoplasm of unspecified part of unspecified bronchus or lung: Secondary | ICD-10-CM

## 2018-12-13 ENCOUNTER — Ambulatory Visit (HOSPITAL_COMMUNITY): Payer: Medicare Other

## 2018-12-13 ENCOUNTER — Other Ambulatory Visit: Payer: Self-pay

## 2018-12-13 ENCOUNTER — Ambulatory Visit (HOSPITAL_COMMUNITY)
Admission: RE | Admit: 2018-12-13 | Discharge: 2018-12-13 | Disposition: A | Discharge status: Home / Self Care | Payer: Medicare Other | Source: Ambulatory Visit | Attending: Radiation Oncology | Admitting: Radiation Oncology

## 2018-12-13 DIAGNOSIS — C3490 Malignant neoplasm of unspecified part of unspecified bronchus or lung: Secondary | ICD-10-CM

## 2018-12-13 MED ORDER — GADOBUTROL 1 MMOL/ML IV SOLN
7.5000 mL | Freq: Once | INTRAVENOUS | Status: AC | PRN
  Administered 2018-12-13: 14:00:00 7.5 mL via INTRAVENOUS

## 2018-12-16 ENCOUNTER — Telehealth: Payer: Self-pay | Admitting: Radiation Oncology

## 2018-12-16 NOTE — Telephone Encounter (Signed)
The patient called back and we were able to discuss his MRI and the plans to repeat in 3 months. We will mail a copy of the report to him as well.

## 2018-12-16 NOTE — Telephone Encounter (Signed)
I called and LM for the patient to let him know his MRI results and that we would recommend another scan in 3 months. His appt for Wednesday will be cancelled.

## 2018-12-18 ENCOUNTER — Ambulatory Visit: Admission: RE | Admit: 2018-12-18 | Payer: Medicare Other | Source: Ambulatory Visit | Admitting: Radiation Oncology

## 2018-12-19 ENCOUNTER — Inpatient Hospital Stay (HOSPITAL_COMMUNITY): Payer: Medicare Other

## 2018-12-19 ENCOUNTER — Encounter (HOSPITAL_COMMUNITY): Payer: Self-pay | Admitting: Hematology

## 2018-12-19 ENCOUNTER — Inpatient Hospital Stay (HOSPITAL_COMMUNITY): Payer: Medicare Other | Attending: Hematology | Admitting: Hematology

## 2018-12-19 ENCOUNTER — Other Ambulatory Visit: Payer: Self-pay

## 2018-12-19 VITALS — BP 99/68 | HR 98 | Temp 98.0°F | Resp 18 | Wt 171.0 lb

## 2018-12-19 VITALS — BP 126/76 | HR 64 | Temp 97.8°F | Resp 16

## 2018-12-19 DIAGNOSIS — C7931 Secondary malignant neoplasm of brain: Secondary | ICD-10-CM | POA: Diagnosis not present

## 2018-12-19 DIAGNOSIS — Z87891 Personal history of nicotine dependence: Secondary | ICD-10-CM | POA: Insufficient documentation

## 2018-12-19 DIAGNOSIS — Z79899 Other long term (current) drug therapy: Secondary | ICD-10-CM | POA: Insufficient documentation

## 2018-12-19 DIAGNOSIS — I10 Essential (primary) hypertension: Secondary | ICD-10-CM

## 2018-12-19 DIAGNOSIS — Z5112 Encounter for antineoplastic immunotherapy: Secondary | ICD-10-CM | POA: Diagnosis present

## 2018-12-19 DIAGNOSIS — R439 Unspecified disturbances of smell and taste: Secondary | ICD-10-CM | POA: Insufficient documentation

## 2018-12-19 DIAGNOSIS — C771 Secondary and unspecified malignant neoplasm of intrathoracic lymph nodes: Secondary | ICD-10-CM | POA: Insufficient documentation

## 2018-12-19 DIAGNOSIS — Z7901 Long term (current) use of anticoagulants: Secondary | ICD-10-CM | POA: Insufficient documentation

## 2018-12-19 DIAGNOSIS — C3491 Malignant neoplasm of unspecified part of right bronchus or lung: Secondary | ICD-10-CM

## 2018-12-19 DIAGNOSIS — R5383 Other fatigue: Secondary | ICD-10-CM | POA: Diagnosis not present

## 2018-12-19 LAB — COMPREHENSIVE METABOLIC PANEL
ALT: 14 U/L (ref 0–44)
AST: 18 U/L (ref 15–41)
Albumin: 4 g/dL (ref 3.5–5.0)
Alkaline Phosphatase: 79 U/L (ref 38–126)
Anion gap: 12 (ref 5–15)
BUN: 11 mg/dL (ref 8–23)
CO2: 26 mmol/L (ref 22–32)
Calcium: 9.9 mg/dL (ref 8.9–10.3)
Chloride: 104 mmol/L (ref 98–111)
Creatinine, Ser: 0.66 mg/dL (ref 0.61–1.24)
GFR calc Af Amer: >60 mL/min (ref >60)
GFR calc non Af Amer: >60 mL/min (ref >60)
Glucose, Bld: 120 mg/dL — ABNORMAL HIGH (ref 70–99)
Potassium: 4.6 mmol/L (ref 3.5–5.1)
Sodium: 142 mmol/L (ref 135–145)
Total Bilirubin: 0.9 mg/dL (ref 0.3–1.2)
Total Protein: 6.5 g/dL (ref 6.5–8.1)

## 2018-12-19 LAB — CBC WITH DIFFERENTIAL/PLATELET
Abs Immature Granulocytes: 0.02 10*3/uL (ref 0.00–0.07)
Basophils Absolute: 0 10*3/uL (ref 0.0–0.1)
Basophils Relative: 0 %
Eosinophils Absolute: 0.2 10*3/uL (ref 0.0–0.5)
Eosinophils Relative: 3 %
HCT: 37.2 % — ABNORMAL LOW (ref 39.0–52.0)
Hemoglobin: 12.1 g/dL — ABNORMAL LOW (ref 13.0–17.0)
Immature Granulocytes: 0 %
Lymphocytes Relative: 15 %
Lymphs Abs: 0.9 10*3/uL (ref 0.7–4.0)
MCH: 34.3 pg — ABNORMAL HIGH (ref 26.0–34.0)
MCHC: 32.5 g/dL (ref 30.0–36.0)
MCV: 105.4 fL — ABNORMAL HIGH (ref 80.0–100.0)
Monocytes Absolute: 0.4 10*3/uL (ref 0.1–1.0)
Monocytes Relative: 7 %
Neutro Abs: 4.4 10*3/uL (ref 1.7–7.7)
Neutrophils Relative %: 75 %
Platelets: 176 10*3/uL (ref 150–400)
RBC: 3.53 MIL/uL — ABNORMAL LOW (ref 4.22–5.81)
RDW: 13.6 % (ref 11.5–15.5)
WBC: 5.9 10*3/uL (ref 4.0–10.5)
nRBC: 0 % (ref 0.0–0.2)

## 2018-12-19 LAB — TSH: TSH: 6.282 u[IU]/mL — ABNORMAL HIGH (ref 0.350–4.500)

## 2018-12-19 LAB — MAGNESIUM: Magnesium: 2.2 mg/dL (ref 1.7–2.4)

## 2018-12-19 MED ORDER — SODIUM CHLORIDE 0.9 % IV SOLN
1500.0000 mg | Freq: Once | INTRAVENOUS | Status: AC
  Administered 2018-12-19: 1500 mg via INTRAVENOUS
  Filled 2018-12-19: qty 30

## 2018-12-19 MED ORDER — SODIUM CHLORIDE 0.9 % IV SOLN
Freq: Once | INTRAVENOUS | Status: AC
  Administered 2018-12-19: 11:00:00 via INTRAVENOUS

## 2018-12-19 MED ORDER — HEPARIN SOD (PORK) LOCK FLUSH 100 UNIT/ML IV SOLN
500.0000 [IU] | Freq: Once | INTRAVENOUS | Status: AC | PRN
  Administered 2018-12-19: 500 [IU]

## 2018-12-19 MED ORDER — SODIUM CHLORIDE 0.9% FLUSH
10.0000 mL | INTRAVENOUS | Status: DC | PRN
  Administered 2018-12-19: 10 mL
  Filled 2018-12-19: qty 10

## 2018-12-19 NOTE — Progress Notes (Signed)
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[place])   08/22/2018 Initial Diagnosis    Small cell carcinoma of lung, right ([place])    08/23/2018 - 11/17/2018 Chemotherapy    The patient had palonosetron (ALOXI) injection 0.25 mg, 0.25 mg, Intravenous,  Once, 4 of 4 cycles Administration: 0.25 mg (08/23/2018), 0.25 mg (09/16/2018), 0.25 mg (10/07/2018), 0.25 mg (10/28/2018) pegfilgrastim-cbqv (UDENYCA) injection 6 mg, 6 mg, Subcutaneous, Once, 4 of 4 cycles Administration: 6 mg (08/27/2018), 6 mg (09/20/2018), 6 mg (10/11/2018), 6 mg (11/01/2018) CARBOplatin (PARAPLATIN) 560 mg in sodium chloride 0.9 % 250 mL chemo infusion, 560 mg (100 % of original dose 564 mg), Intravenous,  Once, 4 of 4 cycles Dose modification:   (original dose 564 mg, Cycle 1),   (original dose 523.5 mg, Cycle 3),   (original dose 523.5 mg, Cycle 4) Administration: 560 mg (08/23/2018), 560 mg (09/16/2018), 520 mg (10/07/2018), 510 mg (10/28/2018) etoposide (VEPESID) 220 mg in sodium chloride 0.9 % 1,000 mL chemo infusion, 100 mg/m2 = 220 mg, Intravenous,  Once, 4 of 4 cycles Administration: 220 mg (08/23/2018), 200 mg (09/16/2018), 200 mg (09/17/2018), 200 mg (09/18/2018), 200 mg (10/07/2018), 200 mg (10/08/2018), 200 mg (10/09/2018), 200 mg (10/28/2018), 200 mg (10/29/2018), 200 mg (10/30/2018) fosaprepitant (EMEND) 150 mg, dexamethasone (DECADRON) 12 mg in sodium chloride 0.9 % 145 mL IVPB, , Intravenous,  Once, 4 of 4 cycles Administration:  (08/23/2018),  (09/16/2018),  (10/07/2018),  (10/28/2018) durvalumab (IMFINZI) 1,500 mg in sodium chloride 0.9 %  100 mL chemo infusion, 1,500 mg (100 % of original dose 1,500 mg), Intravenous,  Once, 3 of 3 cycles Dose modification: 1,500 mg (original dose 1,500 mg, Cycle 2, Reason: Provider Judgment) Administration: 1,500 mg (09/16/2018), 1,500 mg (10/07/2018), 1,500 mg (10/28/2018)  for chemotherapy treatment.      Small cell carcinoma of lung, right ([place])   08/23/2018 - 11/17/2018 Chemotherapy    The patient had palonosetron (ALOXI) injection 0.25 mg, 0.25 mg, Intravenous,  Once, 4 of 4 cycles Administration: 0.25 mg (08/23/2018), 0.25 mg (09/16/2018), 0.25 mg (10/07/2018), 0.25 mg (10/28/2018) pegfilgrastim-cbqv (UDENYCA) injection 6 mg, 6 mg, Subcutaneous, Once, 4 of 4 cycles Administration: 6 mg (08/27/2018), 6 mg (09/20/2018), 6 mg (10/11/2018), 6 mg (11/01/2018) CARBOplatin (PARAPLATIN) 560 mg in sodium chloride 0.9 % 250 mL chemo infusion, 560 mg (100 % of original dose 564 mg), Intravenous,  Once, 4 of 4 cycles Dose modification:   (original dose 564 mg, Cycle 1),   (original dose 523.5 mg, Cycle 3),   (original dose 523.5 mg, Cycle 4) Administration: 560 mg (08/23/2018), 560 mg (09/16/2018), 520 mg (10/07/2018), 510 mg (10/28/2018) etoposide (VEPESID) 220 mg in sodium chloride 0.9 % 1,000 mL chemo infusion, 100 mg/m2 = 220 mg, Intravenous,  Once, 4 of 4 cycles Administration: 220 mg (08/23/2018), 200 mg (09/16/2018), 200 mg (09/17/2018), 200 mg (09/18/2018), 200 mg (10/07/2018), 200 mg (10/08/2018), 200 mg (10/09/2018), 200 mg (10/28/2018), 200 mg (10/29/2018), 200 mg (10/30/2018) fosaprepitant (EMEND) 150 mg, dexamethasone (DECADRON) 12 mg in sodium chloride 0.9 % 145 mL IVPB, , Intravenous,  Once, 4 of 4 cycles Administration:  (08/23/2018),  (09/16/2018),  (  10/07/2018),  (10/28/2018) durvalumab (IMFINZI) 1,500 mg in sodium chloride 0.9 % 100 mL chemo infusion, 1,500 mg (100 % of original dose 1,500 mg), Intravenous,  Once, 3 of 3 cycles Dose modification: 1,500 mg (original dose 1,500 mg, Cycle 2, Reason: Provider Judgment)  Administration: 1,500 mg (09/16/2018), 1,500 mg (10/07/2018), 1,500 mg (10/28/2018)  for chemotherapy treatment.     08/26/2018 Initial Diagnosis    Small cell carcinoma of lung, right ([place])    11/20/2018 -  Chemotherapy    The patient had durvalumab (IMFINZI) 1,500 mg in sodium chloride 0.9 % 100 mL chemo infusion, 1,500 mg (100 % of original dose 1,500 mg), Intravenous,  Once, 2 of 6 cycles Dose modification: 1,500 mg (original dose 1,500 mg, Cycle 1, Reason: Other (see comments)) Administration: 1,500 mg (11/20/2018), 1,500 mg (12/19/2018)  for chemotherapy treatment.       CANCER STAGING: Cancer Staging No matching staging information was found for the patient.   INTERVAL HISTORY:  [name] 73 y.o. male returns for follow-up on the next cycle of immunotherapy.  He reports loss of taste and hence eating has decreased.  Denies any nausea, vomiting or diarrhea.  Energy levels are also decreased.  He is not able to walk for long distance.  Denies any bleeding per rectum or melena.  Denies any diarrhea or skin rashes.  Denies any change in his baseline cough.  No dry cough was reported.  Appetite and energy levels are 50%.  Pain in the back has been stable.  No headaches or vision changes was reported.    REVIEW OF SYSTEMS:  Review of Systems  Constitutional: Positive for appetite change and fatigue.  Gastrointestinal: Negative for constipation and diarrhea.  Neurological: Positive for dizziness.  All other systems reviewed and are negative.    PAST MEDICAL/SURGICAL HISTORY:  Past Medical History:  Diagnosis Date  . Actinic keratosis   . Arthritis   . Colitis MAY 2012    CT ABD/PELVIS HEP FLEXURE  . COPD (chronic obstructive pulmonary disease) ([place])   . Diabetes mellitus without complication ([place])   . History of kidney stones   . Hyperlipemia   . Hypertension   . NSAID long-term use NAPROXEN FOR OA  . SCL Ca dx'd 08/2018   Past Surgical History:  Procedure Laterality Date  .  BACK SURGERY     spinal stenosis  . BASAL CELL CARCINOMA EXCISION  FACE, arms feet, leg  . CHOLECYSTECTOMY  JUNE 2011 MJ   STONES, PANCREATITIS  . KNEE ARTHROSCOPY WITH MEDIAL MENISECTOMY Right 03/24/2015   Procedure: KNEE ARTHROSCOPY WITH MEDIAL MENISECTOMY;  Surgeon: Carole Civil, MD;  Location: [facility];  Service: Orthopedics;  Laterality: Right;  . KNEE SURGERY Left LEFT   arthroscopy  . LITHOTRIPSY  80s  . PORTACATH PLACEMENT Left 09/11/2018   Procedure: INSERTION PORT-A-CATH;  Surgeon: Virl Cagey, MD;  Location: [facility];  Service: General;  Laterality: Left;  . SPINE SURGERY       SOCIAL HISTORY:  Social History   Socioeconomic History  . Marital status: Divorced    Spouse name: Not on file  . Number of children: 0  . Years of education: Not on file  . Highest education level: Not on file  Occupational History  . Occupation: [place] [employer]   . Occupation: Manufacturing engineer   Social Needs  . Financial resource strain: Not hard at all  . Food insecurity:    Worry: Never true    Inability: Never true  .  Transportation needs:    Medical: No    Non-medical: No  Tobacco Use  . Smoking status: Former Smoker    Packs/day: 1.00    Years: 50.00    Pack years: 50.00    Types: Cigarettes    Last attempt to quit: 08/07/2018    Years since quitting: 0.3  . Smokeless tobacco: Never Used  Substance and Sexual Activity  . Alcohol use: Yes    Comment: once a month  . Drug use: No  . Sexual activity: Never    Birth control/protection: None  Lifestyle  . Physical activity:    Days per week: 0 days    Minutes per session: 0 min  . Stress: Not at all  Relationships  . Social connections:    Talks on phone: Never    Gets together: More than three times a week    Attends religious service: Never    Active member of club or organization: Yes    Attends meetings of clubs or organizations: More than 4 times per year    Relationship status: Divorced  . Intimate partner  violence:    Fear of current or ex partner: No    Emotionally abused: No    Physically abused: No    Forced sexual activity: No  Other Topics Concern  . 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    FAMILY HISTORY:  Family History  Problem Relation Age of Onset  . Cancer Mother        lung  . Cancer Father        lung and liver  . Colon polyps Neg Hx   . Colon cancer Neg Hx     CURRENT MEDICATIONS:  Outpatient Encounter Medications as of 12/19/2018  Medication Sig Note  . albuterol (PROVENTIL HFA;VENTOLIN HFA) 108 (90 Base) MCG/ACT inhaler Inhale 2 puffs into the lungs as needed.    Marland Kitchen aspirin 81 MG tablet Take 81 mg by mouth daily.     . Cholecalciferol 50 MCG (2000 UT) TBDP Take 1 tablet by mouth daily.   [place]is (IMFINZI IV) Inject into the vein every 21 ( twenty-one) days.   Marland Kitchen levofloxacin (LEVAQUIN) 750 MG tablet Take 750 mg by mouth daily. X 7 days 12/19/2018: PCP gave this medication.  Takes if he needs it.   . metFORMIN (GLUCOPHAGE) 500 MG tablet Take 500 mg by mouth 2 (two) times daily with a meal.   . Multiple Vitamin (MULTIVITAMIN) tablet Take 1 tablet by mouth daily.   . ondansetron (ZOFRAN) 8 MG tablet Take 1 tablet (8 mg total) by mouth every 8 (eight) hours as needed for nausea or vomiting.   . rosuvastatin (CRESTOR) 10 MG tablet Take 10 mg by mouth daily.     . tamsulosin (FLOMAX) 0.4 MG CAPS capsule Take 0.4 mg by mouth daily.   . verapamil (COVERA HS) 240 MG (CO) 24 hr tablet Take 240 mg by mouth at bedtime.   12/19/2018: Taking 120 mg daily/  . HYDROcodone-acetaminophen (NORCO) 5-325 MG tablet Take 1 tablet by mouth every 4 (four) hours as needed for moderate pain. (Patient not taking: Reported on 11/20/2018)   . scopolamine (TRANSDERM-SCOP) 1 MG/3DAYS Place 1 patch (1.5 mg total) onto the skin every 3 (three) days. (Patient not taking: Reported on 11/20/2018)    . sucralfate (CARAFATE) 1 g tablet Take 1 tablet (  1 g total) by mouth 4 (four) times daily. (Patient not taking: Reported on 11/20/2018)   . [DISCONTINUED] CARBOPLATIN IV Inject into the vein every 21 ( twenty-one) days.   . [DISCONTINUED] ETOPOSIDE IV Inject into the vein every 21 ( twenty-one) days.   . [DISCONTINUED] telmisartan-hydrochlorothiazide (MICARDIS HCT) 40-12.5 MG per tablet Take 1 tablet by mouth daily.      No facility-administered encounter medications on file as of 12/19/2018.     ALLERGIES:  No Known Allergies   PHYSICAL EXAM:  ECOG Performance status: 1  Vitals:   12/19/18 0941  BP: 99/68  Pulse: 98  Resp: 18  Temp: 98 F (36.7 C)  SpO2: 100%   Filed Weights   12/19/18 0941  Weight: 171 lb (77.6 kg)    Physical Exam Vitals signs reviewed.  Constitutional:      Appearance: Normal appearance.  Cardiovascular:     Rate and Rhythm: Normal rate and regular rhythm.     Heart sounds: Normal heart sounds.  Pulmonary:     Effort: Pulmonary effort is normal.     Breath sounds: Normal breath sounds.  Abdominal:     General: There is no distension.     Palpations: Abdomen is soft. There is mass.  Musculoskeletal:        General: No swelling.  Lymphadenopathy:     Cervical: Cervical adenopathy present.  Skin:    General: Skin is warm.  Neurological:     General: No focal deficit present.     Mental Status: He is alert and oriented to person, place, and time.  Psychiatric:        Mood and Affect: Mood normal.        Behavior: Behavior normal.      LABORATORY DATA:  I have reviewed the labs as listed.  CBC    Component Value Date/Time   WBC 5.9 12/19/2018 0919   RBC 3.53 (L) 12/19/2018 0919   HGB 12.1 (L) 12/19/2018 0919   HCT 37.2 (L) 12/19/2018 0919   PLT 176 12/19/2018 0919   MCV 105.4 (H) 12/19/2018 0919   MCH 34.3 (H) 12/19/2018 0919   MCHC 32.5 12/19/2018 0919   RDW 13.6 12/19/2018 0919   LYMPHSABS 0.9 12/19/2018 0919   MONOABS 0.4  12/19/2018 0919   EOSABS 0.2 12/19/2018 0919   BASOSABS 0.0 12/19/2018 0919   CMP Latest Ref Rng & Units 12/19/2018 11/20/2018 10/28/2018  Glucose 70 - 99 mg/dL 120(H) 177(H) 107(H)  BUN 8 - 23 mg/dL 11 12 11   Creatinine 0.61 - 1.24 mg/dL 0.66 0.59(L) 0.67  Sodium 135 - 145 mmol/L 142 134(L) 137  Potassium 3.5 - 5.1 mmol/L 4.6 4.0 4.0  Chloride 98 - 111 mmol/L 104 100 103  CO2 22 - 32 mmol/L 26 24 23   Calcium 8.9 - 10.3 mg/dL 9.9 9.3 9.3  Total Protein 6.5 - 8.1 g/dL 6.5 6.7 6.6  Total Bilirubin 0.3 - 1.2 mg/dL 0.9 0.8 0.6  Alkaline Phos 38 - 126 U/L 79 84 80  AST 15 - 41 U/L 18 20 24   ALT 0 - 44 U/L 14 16 30        DIAGNOSTIC IMAGING:  I have independently reviewed the scans and discussed with the patient.      ASSESSMENT & PLAN:   Small cell carcinoma of lung, right ([place]) 1.  Extensive stage small cell lung cancer: -Patient noted bilateral axillary adenopathy starting in October 2019. - He was evaluated at [facility] with a biopsy of  the axillary lymph node.  He was also diagnosed with small pulmonary embolism and was started on Eliquis. - On 08/21/2018, he presented to the ER with shortness of breath.  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 -4 cycles of carboplatin, VP-16 and durvalumab from 08/23/2018 through 10/28/2018 with durvalumab added during cycle 2. - CT CAP on 10/24/2018 shows marked interval decrease in bulk of mediastinal, supraclavicular and axillary adenopathy. -Physical exam revealed decrease in size of bilateral axillary adenopathy and left neck adenopathy. -Maintenance durvalumab 1500 mg every 4 weeks started on 11/20/2018. -Today he denied any immunotherapy related side effects like dry cough, skin rash, and diarrhea. -However he is continuing to feel very tired.  He does not have good taste for foods. -I have encouraged him to take in more calories and not lose any weight. - He may proceed with his next cycle of durvalumab today. -I will see him back in 4  weeks for follow-up.  I plan to repeat CT CAP prior to next visit.  2.  Brain metastasis: - MRI of the brain on 08/22/2018 showed 3 small subcentimeter metastasis in the right insula, right occipital lobe and right cerebellar hemisphere. - WB RT from 09/02/2018 through 09/13/2018. -I discussed the results of the MRI of the brain from 12/13/2018.  This showed stable examination with 3 successfully treated brain metastasis.   3.  Hypertension: -Blood pressure today is 99/68.  He will continue verapamil daily.  Micardis was discontinued.    Total time spent is 25 minutes with more than 50% of the time spent face-to-face discussing maintenance immunotherapy, side effects and coordination of care.    Orders placed this encounter:  Orders Placed This Encounter  Procedures  . CT Abdomen Pelvis W Contrast  . CT Chest W Contrast      Derek Jack, MD [place] [phone]

## 2018-12-19 NOTE — Assessment & Plan Note (Signed)
1.  Extensive stage small cell lung cancer: -Patient noted bilateral axillary adenopathy starting in October 2019. - He was evaluated at [facility] with a biopsy of the axillary lymph node.  He was also diagnosed with small pulmonary embolism and was started on Eliquis. - On 08/21/2018, he presented to the ER with shortness of breath.  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 -4 cycles of carboplatin, VP-16 and durvalumab from 08/23/2018 through 10/28/2018 with durvalumab added during cycle 2. - CT CAP on 10/24/2018 shows marked interval decrease in bulk of mediastinal, supraclavicular and axillary adenopathy. -Physical exam revealed decrease in size of bilateral axillary adenopathy and left neck adenopathy. -Maintenance durvalumab 1500 mg every 4 weeks started on 11/20/2018. -Today he denied any immunotherapy related side effects like dry cough, skin rash, and diarrhea. -However he is continuing to feel very tired.  He does not have good taste for foods. -I have encouraged him to take in more calories and not lose any weight. - He may proceed with his next cycle of durvalumab today. -I will see him back in 4 weeks for follow-up.  I plan to repeat CT CAP prior to next visit.  2.  Brain metastasis: - MRI of the brain on 08/22/2018 showed 3 small subcentimeter metastasis in the right insula, right occipital lobe and right cerebellar hemisphere. - WB RT from 09/02/2018 through 09/13/2018. -I discussed the results of the MRI of the brain from 12/13/2018.  This showed stable examination with 3 successfully treated brain metastasis.   3.  Hypertension: -Blood pressure today is 99/68.  He will continue verapamil daily.  Micardis was discontinued.

## 2018-12-19 NOTE — Progress Notes (Signed)
Labs reviewed with MD today at office visit. Proceed with treatment today per MD.   Treatment given per orders. Patient tolerated it well without problems. Vitals stable and discharged home from clinic via wheelchair. Follow up as scheduled.

## 2018-12-19 NOTE — Patient Instructions (Signed)
[facility] at [facility] Discharge Instructions  You were seen today by Dr. Katragadda. He went over your recent lab results. He will see you back in 4 weeks for labs and follow up.   Thank you for choosing [facility] at [facility] to provide your oncology and hematology care.  To afford each patient quality time with our provider, please arrive at least 15 minutes before your scheduled appointment time.   If you have a lab appointment with the Cancer Center please come in thru the  Main Entrance and check in at the main information desk  You need to re-schedule your appointment should you arrive 10 or more minutes late.  We strive to give you quality time with our providers, and arriving late affects you and other patients whose appointments are after yours.  Also, if you no show three or more times for appointments you may be dismissed from the clinic at the providers discretion.     Again, thank you for choosing [facility].  Our hope is that these requests will decrease the amount of time that you wait before being seen by our physicians.       _____________________________________________________________  Should you have questions after your visit to [facility], please contact our office at [phone] between the hours of 8:00 a.m. and 4:30 p.m.  Voicemails left after 4:00 p.m. will not be returned until the following business day.  For prescription refill requests, have your pharmacy contact our office and allow 72 hours.    Cancer Center Support Programs:   > Cancer Support Group  2nd Tuesday of the month 1pm-2pm, Journey Room    

## 2018-12-19 NOTE — Patient Instructions (Signed)
[facility] Discharge Instructions for Patients Receiving Chemotherapy  Today you received the following chemotherapy agents   To help prevent nausea and vomiting after your treatment, we encourage you to take your nausea medication   If you develop nausea and vomiting that is not controlled by your nausea medication, call the clinic.   BELOW ARE SYMPTOMS THAT SHOULD BE REPORTED IMMEDIATELY:  *FEVER GREATER THAN 100.5 F  *CHILLS WITH OR WITHOUT FEVER  NAUSEA AND VOMITING THAT IS NOT CONTROLLED WITH YOUR NAUSEA MEDICATION  *UNUSUAL SHORTNESS OF BREATH  *UNUSUAL BRUISING OR BLEEDING  TENDERNESS IN MOUTH AND THROAT WITH OR WITHOUT PRESENCE OF ULCERS  *URINARY PROBLEMS  *BOWEL PROBLEMS  UNUSUAL RASH Items with * indicate a potential emergency and should be followed up as soon as possible.  Feel free to call the clinic should you have any questions or concerns. The clinic phone number is [phone].  Please show the CHEMO ALERT CARD at check-in to the Emergency Department and triage nurse.   

## 2018-12-20 ENCOUNTER — Other Ambulatory Visit: Payer: Self-pay | Admitting: Radiation Therapy

## 2018-12-20 DIAGNOSIS — C7931 Secondary malignant neoplasm of brain: Secondary | ICD-10-CM

## 2018-12-20 DIAGNOSIS — C7949 Secondary malignant neoplasm of other parts of nervous system: Secondary | ICD-10-CM

## 2018-12-25 ENCOUNTER — Telehealth: Payer: Self-pay | Admitting: Radiation Therapy

## 2018-12-25 NOTE — Telephone Encounter (Signed)
Left a detailed voicemail on the home phone about his August Brain MRI and Follow-up with Bryson Ha. My contact information was included in case he has a conflict or question about the appointments.  Mont Dutton R.T.(R)(T) Special Procedures Navigator

## 2019-01-14 ENCOUNTER — Ambulatory Visit (HOSPITAL_COMMUNITY)
Admission: RE | Admit: 2019-01-14 | Discharge: 2019-01-14 | Disposition: A | Discharge status: Home / Self Care | Payer: Medicare Other | Source: Ambulatory Visit | Attending: Hematology | Admitting: Hematology

## 2019-01-14 ENCOUNTER — Other Ambulatory Visit: Payer: Self-pay

## 2019-01-14 DIAGNOSIS — C3491 Malignant neoplasm of unspecified part of right bronchus or lung: Secondary | ICD-10-CM | POA: Diagnosis not present

## 2019-01-14 MED ORDER — IOHEXOL 300 MG/ML  SOLN
100.0000 mL | Freq: Once | INTRAMUSCULAR | Status: AC | PRN
  Administered 2019-01-14: 09:00:00 100 mL via INTRAVENOUS

## 2019-01-15 ENCOUNTER — Other Ambulatory Visit: Payer: Self-pay

## 2019-01-16 ENCOUNTER — Inpatient Hospital Stay (HOSPITAL_COMMUNITY): Payer: Medicare Other | Attending: Hematology

## 2019-01-16 ENCOUNTER — Inpatient Hospital Stay (HOSPITAL_BASED_OUTPATIENT_CLINIC_OR_DEPARTMENT_OTHER): Payer: Medicare Other | Admitting: Hematology

## 2019-01-16 ENCOUNTER — Encounter (HOSPITAL_COMMUNITY): Payer: Self-pay | Admitting: Hematology

## 2019-01-16 ENCOUNTER — Other Ambulatory Visit: Payer: Self-pay

## 2019-01-16 ENCOUNTER — Inpatient Hospital Stay (HOSPITAL_COMMUNITY): Payer: Medicare Other

## 2019-01-16 VITALS — BP 127/89 | HR 108 | Temp 97.0°F | Resp 16 | Wt 169.6 lb

## 2019-01-16 VITALS — BP 131/74 | HR 76 | Temp 97.7°F | Resp 17

## 2019-01-16 DIAGNOSIS — Z5112 Encounter for antineoplastic immunotherapy: Secondary | ICD-10-CM | POA: Insufficient documentation

## 2019-01-16 DIAGNOSIS — C7931 Secondary malignant neoplasm of brain: Secondary | ICD-10-CM | POA: Diagnosis not present

## 2019-01-16 DIAGNOSIS — C3491 Malignant neoplasm of unspecified part of right bronchus or lung: Secondary | ICD-10-CM | POA: Insufficient documentation

## 2019-01-16 DIAGNOSIS — N329 Bladder disorder, unspecified: Secondary | ICD-10-CM

## 2019-01-16 DIAGNOSIS — N3289 Other specified disorders of bladder: Secondary | ICD-10-CM

## 2019-01-16 LAB — MAGNESIUM: Magnesium: 2 mg/dL (ref 1.7–2.4)

## 2019-01-16 LAB — CBC WITH DIFFERENTIAL/PLATELET
Abs Immature Granulocytes: 0.01 10*3/uL (ref 0.00–0.07)
Basophils Absolute: 0 10*3/uL (ref 0.0–0.1)
Basophils Relative: 0 %
Eosinophils Absolute: 0.1 10*3/uL (ref 0.0–0.5)
Eosinophils Relative: 1 %
HCT: 40.6 % (ref 39.0–52.0)
Hemoglobin: 13.5 g/dL (ref 13.0–17.0)
Immature Granulocytes: 0 %
Lymphocytes Relative: 19 %
Lymphs Abs: 1.1 10*3/uL (ref 0.7–4.0)
MCH: 33.8 pg (ref 26.0–34.0)
MCHC: 33.3 g/dL (ref 30.0–36.0)
MCV: 101.5 fL — ABNORMAL HIGH (ref 80.0–100.0)
Monocytes Absolute: 0.5 10*3/uL (ref 0.1–1.0)
Monocytes Relative: 9 %
Neutro Abs: 4.1 10*3/uL (ref 1.7–7.7)
Neutrophils Relative %: 71 %
Platelets: 225 10*3/uL (ref 150–400)
RBC: 4 MIL/uL — ABNORMAL LOW (ref 4.22–5.81)
RDW: 13.2 % (ref 11.5–15.5)
WBC: 5.8 10*3/uL (ref 4.0–10.5)
nRBC: 0 % (ref 0.0–0.2)

## 2019-01-16 LAB — COMPREHENSIVE METABOLIC PANEL
ALT: 11 U/L (ref 0–44)
AST: 16 U/L (ref 15–41)
Albumin: 3.9 g/dL (ref 3.5–5.0)
Alkaline Phosphatase: 85 U/L (ref 38–126)
Anion gap: 9 (ref 5–15)
BUN: 12 mg/dL (ref 8–23)
CO2: 29 mmol/L (ref 22–32)
Calcium: 9.8 mg/dL (ref 8.9–10.3)
Chloride: 101 mmol/L (ref 98–111)
Creatinine, Ser: 0.67 mg/dL (ref 0.61–1.24)
GFR calc Af Amer: >60 mL/min (ref >60)
GFR calc non Af Amer: >60 mL/min (ref >60)
Glucose, Bld: 145 mg/dL — ABNORMAL HIGH (ref 70–99)
Potassium: 4.3 mmol/L (ref 3.5–5.1)
Sodium: 139 mmol/L (ref 135–145)
Total Bilirubin: 0.7 mg/dL (ref 0.3–1.2)
Total Protein: 6.6 g/dL (ref 6.5–8.1)

## 2019-01-16 MED ORDER — SODIUM CHLORIDE 0.9 % IV SOLN
Freq: Once | INTRAVENOUS | Status: AC
  Administered 2019-01-16: 10:00:00 via INTRAVENOUS

## 2019-01-16 MED ORDER — HEPARIN SOD (PORK) LOCK FLUSH 100 UNIT/ML IV SOLN
500.0000 [IU] | Freq: Once | INTRAVENOUS | Status: AC | PRN
  Administered 2019-01-16: 500 [IU]

## 2019-01-16 MED ORDER — SODIUM CHLORIDE 0.9 % IV SOLN
1500.0000 mg | Freq: Once | INTRAVENOUS | Status: AC
  Administered 2019-01-16: 11:00:00 1500 mg via INTRAVENOUS
  Filled 2019-01-16: qty 30

## 2019-01-16 MED ORDER — SODIUM CHLORIDE 0.9% FLUSH
10.0000 mL | INTRAVENOUS | Status: DC | PRN
  Administered 2019-01-16: 10 mL
  Filled 2019-01-16: qty 10

## 2019-01-16 NOTE — Assessment & Plan Note (Addendum)
1.  Extensive stage small cell lung cancer: - 4 cycles of carboplatin, VP-16 and durvalumab from 08/23/2018 through 10/28/2018 -CT CAP on 10/24/2018 showed marked interval decrease in bulk of mediastinal, supraclavicular and axillary adenopathy. -Maintenance durvalumab 1500 mg every 4 weeks started on 11/20/2018. - CT CAP on 01/14/2019 showed continued further decrease in mediastinal, hilar and axillary adenopathy.  New tiny pericardial effusion and new small right pleural effusion. -He denies any immunotherapy related side effects.  However he is not eating much because he cannot taste food.  His appetite has been good.  He is drinking fruit juices and V8.  Drinks about 2 to 3 cans of Ensure per day. - I reviewed his blood work.  He may proceed with his next cycle of durvalumab.  I will reevaluate him in 4 weeks.  2.  Bladder mass: -CT scan showed incidental finding of polypoid lesion along the left posterolateral bladder wall.  Measuring 15 mm. -I have ordered an ultrasound of the bladder.  3.  Brain metastasis: -MRI of the brain on 08/22/2018 showed 3 small subcentimeter metastasis in the right insula, right occipital lobe and right cerebellar hemisphere. -WB RT from 09/02/2018 through 09/13/2018. - MRI of the brain on 12/13/2018 showed stable exam with 3 successfully treated brain metastasis.

## 2019-01-16 NOTE — Progress Notes (Signed)
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[place])  08/22/2018 Initial Diagnosis   Small cell carcinoma of lung, right ([place])   08/23/2018 - 11/17/2018 Chemotherapy   The patient had palonosetron (ALOXI) injection 0.25 mg, 0.25 mg, Intravenous,  Once, 4 of 4 cycles Administration: 0.25 mg (08/23/2018), 0.25 mg (09/16/2018), 0.25 mg (10/07/2018), 0.25 mg (10/28/2018) pegfilgrastim-cbqv (UDENYCA) injection 6 mg, 6 mg, Subcutaneous, Once, 4 of 4 cycles Administration: 6 mg (08/27/2018), 6 mg (09/20/2018), 6 mg (10/11/2018), 6 mg (11/01/2018) CARBOplatin (PARAPLATIN) 560 mg in sodium chloride 0.9 % 250 mL chemo infusion, 560 mg (100 % of original dose 564 mg), Intravenous,  Once, 4 of 4 cycles Dose modification:   (original dose 564 mg, Cycle 1),   (original dose 523.5 mg, Cycle 3),   (original dose 523.5 mg, Cycle 4) Administration: 560 mg (08/23/2018), 560 mg (09/16/2018), 520 mg (10/07/2018), 510 mg (10/28/2018) etoposide (VEPESID) 220 mg in sodium chloride 0.9 % 1,000 mL chemo infusion, 100 mg/m2 = 220 mg, Intravenous,  Once, 4 of 4 cycles Administration: 220 mg (08/23/2018), 200 mg (09/16/2018), 200 mg (09/17/2018), 200 mg (09/18/2018), 200 mg (10/07/2018), 200 mg (10/08/2018), 200 mg (10/09/2018), 200 mg (10/28/2018), 200 mg (10/29/2018), 200 mg (10/30/2018) fosaprepitant (EMEND) 150 mg, dexamethasone (DECADRON) 12 mg in sodium chloride 0.9 % 145 mL IVPB, , Intravenous,  Once, 4 of 4 cycles Administration:  (08/23/2018),  (09/16/2018),  (10/07/2018),  (10/28/2018) durvalumab (IMFINZI) 1,500 mg in sodium  chloride 0.9 % 100 mL chemo infusion, 1,500 mg (100 % of original dose 1,500 mg), Intravenous,  Once, 3 of 3 cycles Dose modification: 1,500 mg (original dose 1,500 mg, Cycle 2, Reason: Provider Judgment) Administration: 1,500 mg (09/16/2018), 1,500 mg (10/07/2018), 1,500 mg (10/28/2018)  for chemotherapy treatment.    Small cell carcinoma of lung, right ([place])  08/23/2018 - 11/17/2018 Chemotherapy   The patient had palonosetron (ALOXI) injection 0.25 mg, 0.25 mg, Intravenous,  Once, 4 of 4 cycles Administration: 0.25 mg (08/23/2018), 0.25 mg (09/16/2018), 0.25 mg (10/07/2018), 0.25 mg (10/28/2018) pegfilgrastim-cbqv (UDENYCA) injection 6 mg, 6 mg, Subcutaneous, Once, 4 of 4 cycles Administration: 6 mg (08/27/2018), 6 mg (09/20/2018), 6 mg (10/11/2018), 6 mg (11/01/2018) CARBOplatin (PARAPLATIN) 560 mg in sodium chloride 0.9 % 250 mL chemo infusion, 560 mg (100 % of original dose 564 mg), Intravenous,  Once, 4 of 4 cycles Dose modification:   (original dose 564 mg, Cycle 1),   (original dose 523.5 mg, Cycle 3),   (original dose 523.5 mg, Cycle 4) Administration: 560 mg (08/23/2018), 560 mg (09/16/2018), 520 mg (10/07/2018), 510 mg (10/28/2018) etoposide (VEPESID) 220 mg in sodium chloride 0.9 % 1,000 mL chemo infusion, 100 mg/m2 = 220 mg, Intravenous,  Once, 4 of 4 cycles Administration: 220 mg (08/23/2018), 200 mg (09/16/2018), 200 mg (09/17/2018), 200 mg (09/18/2018), 200 mg (10/07/2018), 200 mg (10/08/2018), 200 mg (10/09/2018), 200 mg (10/28/2018), 200 mg (10/29/2018), 200 mg (10/30/2018) fosaprepitant (EMEND) 150 mg, dexamethasone (DECADRON) 12 mg in sodium chloride 0.9 % 145 mL IVPB, , Intravenous,  Once, 4 of 4 cycles Administration:  (08/23/2018),  (09/16/2018),  (10/07/2018),  (10/28/2018) durvalumab (IMFINZI) 1,500  mg in sodium chloride 0.9 % 100 mL chemo infusion, 1,500 mg (100 % of original dose 1,500 mg), Intravenous,  Once, 3 of 3 cycles Dose modification: 1,500 mg (original dose 1,500 mg, Cycle 2, Reason: Provider  Judgment) Administration: 1,500 mg (09/16/2018), 1,500 mg (10/07/2018), 1,500 mg (10/28/2018)  for chemotherapy treatment.    08/26/2018 Initial Diagnosis   Small cell carcinoma of lung, right ([place])   11/20/2018 -  Chemotherapy   The patient had durvalumab (IMFINZI) 1,500 mg in sodium chloride 0.9 % 100 mL chemo infusion, 1,500 mg (100 % of original dose 1,500 mg), Intravenous,  Once, 3 of 6 cycles Dose modification: 1,500 mg (original dose 1,500 mg, Cycle 1, Reason: Other (see comments)) Administration: 1,500 mg (11/20/2018), 1,500 mg (12/19/2018), 1,500 mg (01/16/2019)  for chemotherapy treatment.       CANCER STAGING: Cancer Staging No matching staging information was found for the patient.   INTERVAL HISTORY:  [name] 73 y.o. male seen for follow-up of immunotherapy.  He reports lack of taste.  As result he is not able to eat much.  He is drinking about 2-3 ensures per day.  He also drinks fruit juices and V8.  He denies any hematuria.  Appetite has been good.  Energy levels are low.  Denies any fevers or infections.  Denies any diarrhea or skin rashes.  No new dry cough reported.  REVIEW OF SYSTEMS:  Review of Systems  Constitutional: Negative for appetite change and fatigue.  Gastrointestinal: Negative for constipation and diarrhea.  Neurological: Negative for dizziness.  Psychiatric/Behavioral: Positive for sleep disturbance.  All other systems reviewed and are negative.    PAST MEDICAL/SURGICAL HISTORY:  Past Medical History:  Diagnosis Date  . Actinic keratosis   . Arthritis   . Colitis MAY 2012    CT ABD/PELVIS HEP FLEXURE  . COPD (chronic obstructive pulmonary disease) ([place])   . Diabetes mellitus without complication ([place])   . History of kidney stones   . Hyperlipemia   . Hypertension   . NSAID long-term use NAPROXEN FOR OA  . SCL Ca dx'd 08/2018   Past Surgical History:  Procedure Laterality Date  . BACK SURGERY     spinal stenosis  . BASAL CELL CARCINOMA  EXCISION  FACE, arms feet, leg  . CHOLECYSTECTOMY  JUNE 2011 MJ   STONES, PANCREATITIS  . KNEE ARTHROSCOPY WITH MEDIAL MENISECTOMY Right 03/24/2015   Procedure: KNEE ARTHROSCOPY WITH MEDIAL MENISECTOMY;  Surgeon: Carole Civil, MD;  Location: [facility];  Service: Orthopedics;  Laterality: Right;  . KNEE SURGERY Left LEFT   arthroscopy  . LITHOTRIPSY  80s  . PORTACATH PLACEMENT Left 09/11/2018   Procedure: INSERTION PORT-A-CATH;  Surgeon: Virl Cagey, MD;  Location: [facility];  Service: General;  Laterality: Left;  . SPINE SURGERY       SOCIAL HISTORY:  Social History   Socioeconomic History  . Marital status: Divorced    Spouse name: Not on file  . Number of children: 0  . Years of education: Not on file  . Highest education level: Not on file  Occupational History  . Occupation: [place] [employer]   . Occupation: Manufacturing engineer   Social Needs  . Financial resource strain: Not hard at all  . Food insecurity    Worry: Never true    Inability: Never true  . Transportation needs    Medical: No    Non-medical: No  Tobacco Use  . Smoking status: Former Smoker    Packs/day:  1.00    Years: 50.00    Pack years: 50.00    Types: Cigarettes    Quit date: 08/07/2018    Years since quitting: 0.4  . Smokeless tobacco: Never Used  Substance and Sexual Activity  . Alcohol use: Yes    Comment: once a month  . Drug use: No  . Sexual activity: Never    Birth control/protection: None  Lifestyle  . Physical activity    Days per week: 0 days    Minutes per session: 0 min  . Stress: Not at all  Relationships  . Social Herbalist on phone: Never    Gets together: More than three times a week    Attends religious service: Never    Active member of club or organization: Yes    Attends meetings of clubs or organizations: More than 4 times per year    Relationship status: Divorced  . Intimate partner violence    Fear of current or ex partner: No    Emotionally abused: No     Physically abused: No    Forced sexual activity: No  Other Topics Concern  . 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    FAMILY HISTORY:  Family History  Problem Relation Age of Onset  . Cancer Mother        lung  . Cancer Father        lung and liver  . Colon polyps Neg Hx   . Colon cancer Neg Hx     CURRENT MEDICATIONS:  Outpatient Encounter Medications as of 01/16/2019  Medication Sig Note  . albuterol (PROVENTIL HFA;VENTOLIN HFA) 108 (90 Base) MCG/ACT inhaler Inhale 2 puffs into the lungs as needed.    Marland Kitchen aspirin 81 MG tablet Take 81 mg by mouth daily.     . Cholecalciferol 50 MCG (2000 UT) TBDP Take 1 tablet by mouth daily.   [place] (IMFINZI IV) Inject into the vein every 21 ( twenty-one) days.   Marland Kitchen levofloxacin (LEVAQUIN) 750 MG tablet Take 750 mg by mouth daily. X 7 days 12/19/2018: PCP gave this medication.  Takes if he needs it.   . metFORMIN (GLUCOPHAGE) 500 MG tablet Take 500 mg by mouth 2 (two) times daily with a meal.   . Multiple Vitamin (MULTIVITAMIN) tablet Take 1 tablet by mouth daily.   . naproxen (NAPROSYN) 250 MG tablet Take 250 mg by mouth as needed.   . ondansetron (ZOFRAN) 8 MG tablet Take 1 tablet (8 mg total) by mouth every 8 (eight) hours as needed for nausea or vomiting.   . rosuvastatin (CRESTOR) 10 MG tablet Take 10 mg by mouth daily.     . tamsulosin (FLOMAX) 0.4 MG CAPS capsule Take 0.4 mg by mouth daily.   . verapamil (COVERA HS) 240 MG (CO) 24 hr tablet Take 240 mg by mouth at bedtime.   12/19/2018: Taking 120 mg daily/  . HYDROcodone-acetaminophen (NORCO) 5-325 MG tablet Take 1 tablet by mouth every 4 (four) hours as needed for moderate pain. (Patient not taking: Reported on 11/20/2018)   . scopolamine (TRANSDERM-SCOP) 1 MG/3DAYS Place 1 patch (1.5 mg total) onto the skin every 3 (three) days. (Patient not taking: Reported on 11/20/2018)   .  sucralfate (CARAFATE) 1 g tablet Take 1 tablet (1 g total) by mouth 4 (four) times daily. (Patient not taking:  Reported on 11/20/2018)    No facility-administered encounter medications on file as of 01/16/2019.     ALLERGIES:  No Known Allergies   PHYSICAL EXAM:  ECOG Performance status: 1  Vitals:   01/16/19 0918  BP: 127/89  Pulse: (!) 108  Resp: 16  Temp: (!) 97 F (36.1 C)  SpO2: 97%   Filed Weights   01/16/19 0918  Weight: 169 lb 9.6 oz (76.9 kg)    Physical Exam Vitals signs reviewed.  Constitutional:      Appearance: Normal appearance.  Cardiovascular:     Rate and Rhythm: Normal rate and regular rhythm.     Heart sounds: Normal heart sounds.  Pulmonary:     Effort: Pulmonary effort is normal.     Breath sounds: Normal breath sounds.  Abdominal:     General: There is no distension.     Palpations: Abdomen is soft. There is mass.  Musculoskeletal:        General: No swelling.  Lymphadenopathy:     Cervical: Cervical adenopathy present.  Skin:    General: Skin is warm.  Neurological:     General: No focal deficit present.     Mental Status: He is alert and oriented to person, place, and time.  Psychiatric:        Mood and Affect: Mood normal.        Behavior: Behavior normal.      LABORATORY DATA:  I have reviewed the labs as listed.  CBC    Component Value Date/Time   WBC 5.8 01/16/2019 0850   RBC 4.00 (L) 01/16/2019 0850   HGB 13.5 01/16/2019 0850   HCT 40.6 01/16/2019 0850   PLT 225 01/16/2019 0850   MCV 101.5 (H) 01/16/2019 0850   MCH 33.8 01/16/2019 0850   MCHC 33.3 01/16/2019 0850   RDW 13.2 01/16/2019 0850   LYMPHSABS 1.1 01/16/2019 0850   MONOABS 0.5 01/16/2019 0850   EOSABS 0.1 01/16/2019 0850   BASOSABS 0.0 01/16/2019 0850   CMP Latest Ref Rng & Units 01/16/2019 12/19/2018 11/20/2018  Glucose 70 - 99 mg/dL 145(H) 120(H) 177(H)  BUN 8 - 23 mg/dL 12 11 12   Creatinine 0.61 - 1.24 mg/dL 0.67 0.66 0.59(L)  Sodium 135 - 145 mmol/L 139  142 134(L)  Potassium 3.5 - 5.1 mmol/L 4.3 4.6 4.0  Chloride 98 - 111 mmol/L 101 104 100  CO2 22 - 32 mmol/L 29 26 24   Calcium 8.9 - 10.3 mg/dL 9.8 9.9 9.3  Total Protein 6.5 - 8.1 g/dL 6.6 6.5 6.7  Total Bilirubin 0.3 - 1.2 mg/dL 0.7 0.9 0.8  Alkaline Phos 38 - 126 U/L 85 79 84  AST 15 - 41 U/L 16 18 20   ALT 0 - 44 U/L 11 14 16        DIAGNOSTIC IMAGING:  I have independently reviewed the scans and discussed with the patient.      ASSESSMENT & PLAN:   Small cell carcinoma of lung, right ([place]) 1.  Extensive stage small cell lung cancer: - 4 cycles of carboplatin, VP-16 and durvalumab from 08/23/2018 through 10/28/2018 -CT CAP on 10/24/2018 showed marked interval decrease in bulk of mediastinal, supraclavicular and axillary adenopathy. -Maintenance durvalumab 1500 mg every 4 weeks started on 11/20/2018. - CT CAP on 01/14/2019 showed continued further decrease in mediastinal, hilar and axillary adenopathy.  New tiny pericardial effusion and new small right pleural effusion. -He denies any immunotherapy related side effects.  However he is not eating much because he  cannot taste food.  His appetite has been good. - I reviewed his blood work.  He may proceed with his next cycle of durvalumab.  I will reevaluate him in 4 weeks.  2.  Bladder mass: -CT scan showed incidental finding of polypoid lesion along the left posterolateral bladder wall.  Measuring 15 mm. -I have ordered an ultrasound of the bladder.  3.  Brain metastasis: -MRI of the brain on 08/22/2018 showed 3 small subcentimeter metastasis in the right insula, right occipital lobe and right cerebellar hemisphere. -WB RT from 09/02/2018 through 09/13/2018. - MRI of the brain on 12/13/2018 showed stable exam with 3 successfully treated brain metastasis.  Total time spent is 25 minutes with more than 50% of the time spent face-to-face discussing maintenance immunotherapy, side effects and coordination of care.    Orders placed this  encounter:  Orders Placed This Encounter  Procedures  . US Abdomen Limited  . CBC with Differential/Platelet  . Comprehensive metabolic panel  . Magnesium  . TSH      Derek Jack, [place] [phone]

## 2019-01-16 NOTE — Patient Instructions (Signed)
[facility] Discharge Instructions for Patients Receiving Chemotherapy  Today you received the following chemotherapy agents   To help prevent nausea and vomiting after your treatment, we encourage you to take your nausea medications.   If you develop nausea and vomiting that is not controlled by your nausea medication, call the clinic.   BELOW ARE SYMPTOMS THAT SHOULD BE REPORTED IMMEDIATELY:  *FEVER GREATER THAN 100.5 F  *CHILLS WITH OR WITHOUT FEVER  NAUSEA AND VOMITING THAT IS NOT CONTROLLED WITH YOUR NAUSEA MEDICATION  *UNUSUAL SHORTNESS OF BREATH  *UNUSUAL BRUISING OR BLEEDING  TENDERNESS IN MOUTH AND THROAT WITH OR WITHOUT PRESENCE OF ULCERS  *URINARY PROBLEMS  *BOWEL PROBLEMS  UNUSUAL RASH Items with * indicate a potential emergency and should be followed up as soon as possible.  Feel free to call the clinic should you have any questions or concerns. The clinic phone number is [phone].  Please show the [facility] at check-in to the Emergency Department and triage nurse.

## 2019-01-16 NOTE — Progress Notes (Signed)
Message received via instant message by [facility] LPN. Labs reviewed. Proceed with tx. Pulse recheck 85.   Treatment given today per MD orders. Tolerated infusion without adverse affects. Vital signs stable. No complaints at this time. Discharged from clinic ambulatory. F/U with [facility] as scheduled.

## 2019-01-24 ENCOUNTER — Ambulatory Visit (HOSPITAL_COMMUNITY)
Admission: RE | Admit: 2019-01-24 | Discharge: 2019-01-24 | Disposition: A | Discharge status: Home / Self Care | Payer: Medicare Other | Source: Ambulatory Visit | Attending: Hematology | Admitting: Hematology

## 2019-01-24 ENCOUNTER — Other Ambulatory Visit (HOSPITAL_COMMUNITY): Payer: Self-pay | Admitting: Hematology

## 2019-01-24 ENCOUNTER — Other Ambulatory Visit: Payer: Self-pay

## 2019-01-24 DIAGNOSIS — N3289 Other specified disorders of bladder: Secondary | ICD-10-CM | POA: Diagnosis not present

## 2019-02-13 ENCOUNTER — Inpatient Hospital Stay (HOSPITAL_COMMUNITY): Payer: Medicare Other

## 2019-02-13 ENCOUNTER — Inpatient Hospital Stay (HOSPITAL_BASED_OUTPATIENT_CLINIC_OR_DEPARTMENT_OTHER): Payer: Medicare Other | Admitting: Hematology

## 2019-02-13 ENCOUNTER — Encounter (HOSPITAL_COMMUNITY): Payer: Self-pay | Admitting: Hematology

## 2019-02-13 ENCOUNTER — Other Ambulatory Visit: Payer: Self-pay

## 2019-02-13 ENCOUNTER — Inpatient Hospital Stay (HOSPITAL_COMMUNITY): Payer: Medicare Other | Attending: Hematology

## 2019-02-13 VITALS — BP 133/73 | HR 75 | Temp 97.8°F | Resp 18

## 2019-02-13 DIAGNOSIS — Z5112 Encounter for antineoplastic immunotherapy: Secondary | ICD-10-CM | POA: Diagnosis present

## 2019-02-13 DIAGNOSIS — J910 Malignant pleural effusion: Secondary | ICD-10-CM | POA: Insufficient documentation

## 2019-02-13 DIAGNOSIS — C3491 Malignant neoplasm of unspecified part of right bronchus or lung: Secondary | ICD-10-CM | POA: Insufficient documentation

## 2019-02-13 DIAGNOSIS — R634 Abnormal weight loss: Secondary | ICD-10-CM | POA: Diagnosis not present

## 2019-02-13 DIAGNOSIS — N329 Bladder disorder, unspecified: Secondary | ICD-10-CM | POA: Diagnosis not present

## 2019-02-13 LAB — CBC WITH DIFFERENTIAL/PLATELET
Abs Immature Granulocytes: 0.02 10*3/uL (ref 0.00–0.07)
Basophils Absolute: 0 10*3/uL (ref 0.0–0.1)
Basophils Relative: 0 %
Eosinophils Absolute: 0.3 10*3/uL (ref 0.0–0.5)
Eosinophils Relative: 4 %
HCT: 40.3 % (ref 39.0–52.0)
Hemoglobin: 13.6 g/dL (ref 13.0–17.0)
Immature Granulocytes: 0 %
Lymphocytes Relative: 14 %
Lymphs Abs: 0.9 10*3/uL (ref 0.7–4.0)
MCH: 33.1 pg (ref 26.0–34.0)
MCHC: 33.7 g/dL (ref 30.0–36.0)
MCV: 98.1 fL (ref 80.0–100.0)
Monocytes Absolute: 0.6 10*3/uL (ref 0.1–1.0)
Monocytes Relative: 8 %
Neutro Abs: 5 10*3/uL (ref 1.7–7.7)
Neutrophils Relative %: 74 %
Platelets: 221 10*3/uL (ref 150–400)
RBC: 4.11 MIL/uL — ABNORMAL LOW (ref 4.22–5.81)
RDW: 13.2 % (ref 11.5–15.5)
WBC: 6.8 10*3/uL (ref 4.0–10.5)
nRBC: 0 % (ref 0.0–0.2)

## 2019-02-13 LAB — COMPREHENSIVE METABOLIC PANEL
ALT: 10 U/L (ref 0–44)
AST: 15 U/L (ref 15–41)
Albumin: 3.7 g/dL (ref 3.5–5.0)
Alkaline Phosphatase: 90 U/L (ref 38–126)
Anion gap: 9 (ref 5–15)
BUN: 10 mg/dL (ref 8–23)
CO2: 28 mmol/L (ref 22–32)
Calcium: 9.6 mg/dL (ref 8.9–10.3)
Chloride: 102 mmol/L (ref 98–111)
Creatinine, Ser: 0.61 mg/dL (ref 0.61–1.24)
GFR calc Af Amer: >60 mL/min (ref >60)
GFR calc non Af Amer: >60 mL/min (ref >60)
Glucose, Bld: 129 mg/dL — ABNORMAL HIGH (ref 70–99)
Potassium: 5 mmol/L (ref 3.5–5.1)
Sodium: 139 mmol/L (ref 135–145)
Total Bilirubin: 0.7 mg/dL (ref 0.3–1.2)
Total Protein: 6.4 g/dL — ABNORMAL LOW (ref 6.5–8.1)

## 2019-02-13 LAB — TSH: TSH: 0.745 u[IU]/mL (ref 0.350–4.500)

## 2019-02-13 LAB — MAGNESIUM: Magnesium: 2 mg/dL (ref 1.7–2.4)

## 2019-02-13 MED ORDER — SODIUM CHLORIDE 0.9 % IV SOLN
1500.0000 mg | Freq: Once | INTRAVENOUS | Status: AC
  Administered 2019-02-13: 1500 mg via INTRAVENOUS
  Filled 2019-02-13: qty 30

## 2019-02-13 MED ORDER — MIRTAZAPINE 15 MG PO TABS: 15.0000 mg | ORAL_TABLET | Freq: Every day | ORAL | 1 refills | Status: DC

## 2019-02-13 MED ORDER — HEPARIN SOD (PORK) LOCK FLUSH 100 UNIT/ML IV SOLN
500.0000 [IU] | Freq: Once | INTRAVENOUS | Status: AC | PRN
  Administered 2019-02-13: 500 [IU]

## 2019-02-13 MED ORDER — SODIUM CHLORIDE 0.9 % IV SOLN
Freq: Once | INTRAVENOUS | Status: AC
  Administered 2019-02-13: 11:00:00 via INTRAVENOUS

## 2019-02-13 NOTE — Progress Notes (Signed)
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[place])  08/22/2018 Initial Diagnosis   Small cell carcinoma of lung, right ([place])   08/23/2018 - 11/17/2018 Chemotherapy   The patient had palonosetron (ALOXI) injection 0.25 mg, 0.25 mg, Intravenous,  Once, 4 of 4 cycles Administration: 0.25 mg (08/23/2018), 0.25 mg (09/16/2018), 0.25 mg (10/07/2018), 0.25 mg (10/28/2018) pegfilgrastim-cbqv (UDENYCA) injection 6 mg, 6 mg, Subcutaneous, Once, 4 of 4 cycles Administration: 6 mg (08/27/2018), 6 mg (09/20/2018), 6 mg (10/11/2018), 6 mg (11/01/2018) CARBOplatin (PARAPLATIN) 560 mg in sodium chloride 0.9 % 250 mL chemo infusion, 560 mg (100 % of original dose 564 mg), Intravenous,  Once, 4 of 4 cycles Dose modification:   (original dose 564 mg, Cycle 1),   (original dose 523.5 mg, Cycle 3),   (original dose 523.5 mg, Cycle 4) Administration: 560 mg (08/23/2018), 560 mg (09/16/2018), 520 mg (10/07/2018), 510 mg (10/28/2018) etoposide (VEPESID) 220 mg in sodium chloride 0.9 % 1,000 mL chemo infusion, 100 mg/m2 = 220 mg, Intravenous,  Once, 4 of 4 cycles Administration: 220 mg (08/23/2018), 200 mg (09/16/2018), 200 mg (09/17/2018), 200 mg (09/18/2018), 200 mg (10/07/2018), 200 mg (10/08/2018), 200 mg (10/09/2018), 200 mg (10/28/2018), 200 mg (10/29/2018), 200 mg (10/30/2018) fosaprepitant (EMEND) 150 mg, dexamethasone (DECADRON) 12 mg in sodium chloride 0.9 % 145 mL IVPB, , Intravenous,  Once, 4 of 4 cycles Administration:  (08/23/2018),  (09/16/2018),  (10/07/2018),  (10/28/2018) durvalumab (IMFINZI) 1,500 mg in sodium  chloride 0.9 % 100 mL chemo infusion, 1,500 mg (100 % of original dose 1,500 mg), Intravenous,  Once, 3 of 3 cycles Dose modification: 1,500 mg (original dose 1,500 mg, Cycle 2, Reason: Provider Judgment) Administration: 1,500 mg (09/16/2018), 1,500 mg (10/07/2018), 1,500 mg (10/28/2018)  for chemotherapy treatment.    Small cell carcinoma of lung, right ([place])  08/23/2018 - 11/17/2018 Chemotherapy   The patient had palonosetron (ALOXI) injection 0.25 mg, 0.25 mg, Intravenous,  Once, 4 of 4 cycles Administration: 0.25 mg (08/23/2018), 0.25 mg (09/16/2018), 0.25 mg (10/07/2018), 0.25 mg (10/28/2018) pegfilgrastim-cbqv (UDENYCA) injection 6 mg, 6 mg, Subcutaneous, Once, 4 of 4 cycles Administration: 6 mg (08/27/2018), 6 mg (09/20/2018), 6 mg (10/11/2018), 6 mg (11/01/2018) CARBOplatin (PARAPLATIN) 560 mg in sodium chloride 0.9 % 250 mL chemo infusion, 560 mg (100 % of original dose 564 mg), Intravenous,  Once, 4 of 4 cycles Dose modification:   (original dose 564 mg, Cycle 1),   (original dose 523.5 mg, Cycle 3),   (original dose 523.5 mg, Cycle 4) Administration: 560 mg (08/23/2018), 560 mg (09/16/2018), 520 mg (10/07/2018), 510 mg (10/28/2018) etoposide (VEPESID) 220 mg in sodium chloride 0.9 % 1,000 mL chemo infusion, 100 mg/m2 = 220 mg, Intravenous,  Once, 4 of 4 cycles Administration: 220 mg (08/23/2018), 200 mg (09/16/2018), 200 mg (09/17/2018), 200 mg (09/18/2018), 200 mg (10/07/2018), 200 mg (10/08/2018), 200 mg (10/09/2018), 200 mg (10/28/2018), 200 mg (10/29/2018), 200 mg (10/30/2018) fosaprepitant (EMEND) 150 mg, dexamethasone (DECADRON) 12 mg in sodium chloride 0.9 % 145 mL IVPB, , Intravenous,  Once, 4 of 4 cycles Administration:  (08/23/2018),  (09/16/2018),  (10/07/2018),  (10/28/2018) durvalumab (IMFINZI) 1,500  mg in sodium chloride 0.9 % 100 mL chemo infusion, 1,500 mg (100 % of original dose 1,500 mg), Intravenous,  Once, 3 of 3 cycles Dose modification: 1,500 mg (original dose 1,500 mg, Cycle 2, Reason: Provider  Judgment) Administration: 1,500 mg (09/16/2018), 1,500 mg (10/07/2018), 1,500 mg (10/28/2018)  for chemotherapy treatment.    08/26/2018 Initial Diagnosis   Small cell carcinoma of lung, right ([place])   11/20/2018 -  Chemotherapy   The patient had durvalumab (IMFINZI) 1,500 mg in sodium chloride 0.9 % 100 mL chemo infusion, 1,500 mg (100 % of original dose 1,500 mg), Intravenous,  Once, 4 of 6 cycles Dose modification: 1,500 mg (original dose 1,500 mg, Cycle 1, Reason: Other (see comments)) Administration: 1,500 mg (11/20/2018), 1,500 mg (12/19/2018), 1,500 mg (01/16/2019), 1,500 mg (02/13/2019)  for chemotherapy treatment.       CANCER STAGING: Cancer Staging No matching staging information was found for the patient.   INTERVAL HISTORY:  [name] 73 y.o. male seen for follow-up of his small cell lung cancer.  He lost about 6 pounds since last visit.  No appetite and low energy levels reported.  Shortness of breath on exertion present.  He reported loose bowel movements and last week he noticed some blood in the stools.  Denies any rectal pain.  He does not have any tasty foods.  Last colonoscopy was in 2012.  REVIEW OF SYSTEMS:  Review of Systems  Constitutional: Positive for appetite change and fatigue.  Gastrointestinal: Positive for blood in stool. Negative for constipation and diarrhea.  Neurological: Negative for dizziness.  Psychiatric/Behavioral: Positive for sleep disturbance.  All other systems reviewed and are negative.    PAST MEDICAL/SURGICAL HISTORY:  Past Medical History:  Diagnosis Date  . Actinic keratosis   . Arthritis   . Colitis MAY 2012    CT ABD/PELVIS HEP FLEXURE  . COPD (chronic obstructive pulmonary disease) ([place])   . Diabetes mellitus without complication ([place])   . History of kidney stones   . Hyperlipemia   . Hypertension   . NSAID long-term use NAPROXEN FOR OA  . SCL Ca dx'd 08/2018   Past Surgical History:  Procedure Laterality Date  . BACK SURGERY      spinal stenosis  . BASAL CELL CARCINOMA EXCISION  FACE, arms feet, leg  . CHOLECYSTECTOMY  JUNE 2011 MJ   STONES, PANCREATITIS  . KNEE ARTHROSCOPY WITH MEDIAL MENISECTOMY Right 03/24/2015   Procedure: KNEE ARTHROSCOPY WITH MEDIAL MENISECTOMY;  Surgeon: Carole Civil, MD;  Location: [facility];  Service: Orthopedics;  Laterality: Right;  . KNEE SURGERY Left LEFT   arthroscopy  . LITHOTRIPSY  80s  . PORTACATH PLACEMENT Left 09/11/2018   Procedure: INSERTION PORT-A-CATH;  Surgeon: Virl Cagey, MD;  Location: [facility];  Service: General;  Laterality: Left;  . SPINE SURGERY       SOCIAL HISTORY:  Social History   Socioeconomic History  . Marital status: Divorced    Spouse name: Not on file  . Number of children: 0  . Years of education: Not on file  . Highest education level: Not on file  Occupational History  . Occupation: [employer]   . Occupation: Manufacturing engineer   Social Needs  . Financial resource strain: Not hard at all  . Food insecurity    Worry: Never true    Inability: Never true  . Transportation needs    Medical: No    Non-medical: No  Tobacco Use  . Smoking status: Former Smoker  Packs/day: 1.00    Years: 50.00    Pack years: 50.00    Types: Cigarettes    Quit date: 08/07/2018    Years since quitting: 0.5  . Smokeless tobacco: Never Used  Substance and Sexual Activity  . Alcohol use: Yes    Comment: once a month  . Drug use: No  . Sexual activity: Never    Birth control/protection: None  Lifestyle  . Physical activity    Days per week: 0 days    Minutes per session: 0 min  . Stress: Not at all  Relationships  . Social Herbalist on phone: Never    Gets together: More than three times a week    Attends religious service: Never    Active member of club or organization: Yes    Attends meetings of clubs or organizations: More than 4 times per year    Relationship status: Divorced  . Intimate partner violence    Fear of current or  ex partner: No    Emotionally abused: No    Physically abused: No    Forced sexual activity: No  Other Topics Concern  . 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    FAMILY HISTORY:  Family History  Problem Relation Age of Onset  . Cancer Mother        lung  . Cancer Father        lung and liver  . Colon polyps Neg Hx   . Colon cancer Neg Hx     CURRENT MEDICATIONS:  Outpatient Encounter Medications as of 02/13/2019  Medication Sig Note  . albuterol (PROVENTIL HFA;VENTOLIN HFA) 108 (90 Base) MCG/ACT inhaler Inhale 2 puffs into the lungs as needed.    Marland Kitchen aspirin 81 MG tablet Take 81 mg by mouth daily.     . Cholecalciferol 50 MCG (2000 UT) TBDP Take 1 tablet by mouth daily.   [place] (IMFINZI IV) Inject into the vein every 21 ( twenty-one) days.   Marland Kitchen HYDROcodone-acetaminophen (NORCO) 5-325 MG tablet Take 1 tablet by mouth every 4 (four) hours as needed for moderate pain. (Patient not taking: Reported on 11/20/2018)   . metFORMIN (GLUCOPHAGE) 500 MG tablet Take 500 mg by mouth 2 (two) times daily with a meal.   . mirtazapine (REMERON) 15 MG tablet Take 1 tablet (15 mg total) by mouth at bedtime.   . Multiple Vitamin (MULTIVITAMIN) tablet Take 1 tablet by mouth daily.   . naproxen (NAPROSYN) 250 MG tablet Take 250 mg by mouth as needed.   . ondansetron (ZOFRAN) 8 MG tablet Take 1 tablet (8 mg total) by mouth every 8 (eight) hours as needed for nausea or vomiting.   . rosuvastatin (CRESTOR) 10 MG tablet Take 10 mg by mouth daily.     Marland Kitchen scopolamine (TRANSDERM-SCOP) 1 MG/3DAYS Place 1 patch (1.5 mg total) onto the skin every 3 (three) days. (Patient not taking: Reported on 11/20/2018)   . sucralfate (CARAFATE) 1 g tablet Take 1 tablet (1 g total) by mouth 4 (four) times daily. (Patient not taking: Reported on 11/20/2018)   . tamsulosin (FLOMAX) 0.4 MG CAPS capsule Take 0.4 mg by mouth  daily.   . verapamil (COVERA HS) 240 MG (CO) 24 hr tablet Take 240 mg by mouth at bedtime.   12/19/2018: Taking 120 mg daily/  . [DISCONTINUED] levofloxacin (LEVAQUIN)  750 MG tablet Take 750 mg by mouth daily. X 7 days 12/19/2018: PCP gave this medication.  Takes if he needs it.    No facility-administered encounter medications on file as of 02/13/2019.     ALLERGIES:  No Known Allergies   PHYSICAL EXAM:  ECOG Performance status: 1  Vitals:   02/13/19 0958  BP: 120/90  Pulse: 85  Resp: 16  Temp: 98.4 F (36.9 C)  SpO2: 99%   Filed Weights   02/13/19 0958  Weight: 162 lb 8 oz (73.7 kg)    Physical Exam Vitals signs reviewed.  Constitutional:      Appearance: Normal appearance.  Cardiovascular:     Rate and Rhythm: Normal rate and regular rhythm.     Heart sounds: Normal heart sounds.  Pulmonary:     Effort: Pulmonary effort is normal.     Breath sounds: Normal breath sounds.  Abdominal:     General: There is no distension.     Palpations: Abdomen is soft. There is mass.  Musculoskeletal:        General: No swelling.  Lymphadenopathy:     Cervical: Cervical adenopathy present.  Skin:    General: Skin is warm.  Neurological:     General: No focal deficit present.     Mental Status: He is alert and oriented to person, place, and time.  Psychiatric:        Mood and Affect: Mood normal.        Behavior: Behavior normal.      LABORATORY DATA:  I have reviewed the labs as listed.  CBC    Component Value Date/Time   WBC 6.8 02/13/2019 0938   RBC 4.11 (L) 02/13/2019 0938   HGB 13.6 02/13/2019 0938   HCT 40.3 02/13/2019 0938   PLT 221 02/13/2019 0938   MCV 98.1 02/13/2019 0938   MCH 33.1 02/13/2019 0938   MCHC 33.7 02/13/2019 0938   RDW 13.2 02/13/2019 0938   LYMPHSABS 0.9 02/13/2019 0938   MONOABS 0.6 02/13/2019 0938   EOSABS 0.3 02/13/2019 0938   BASOSABS 0.0 02/13/2019 0938   CMP Latest Ref Rng & Units 02/13/2019 01/16/2019 12/19/2018  Glucose 70 - 99  mg/dL 129(H) 145(H) 120(H)  BUN 8 - 23 mg/dL 10 12 11   Creatinine 0.61 - 1.24 mg/dL 0.61 0.67 0.66  Sodium 135 - 145 mmol/L 139 139 142  Potassium 3.5 - 5.1 mmol/L 5.0 4.3 4.6  Chloride 98 - 111 mmol/L 102 101 104  CO2 22 - 32 mmol/L 28 29 26   Calcium 8.9 - 10.3 mg/dL 9.6 9.8 9.9  Total Protein 6.5 - 8.1 g/dL 6.4(L) 6.6 6.5  Total Bilirubin 0.3 - 1.2 mg/dL 0.7 0.7 0.9  Alkaline Phos 38 - 126 U/L 90 85 79  AST 15 - 41 U/L 15 16 18   ALT 0 - 44 U/L 10 11 14        DIAGNOSTIC IMAGING:  I have independently reviewed the scans and discussed with the patient.      ASSESSMENT & PLAN:   Small cell carcinoma of lung, right ([place]) 1.  Extensive stage small cell lung cancer: - 4 cycles of carboplatin, VP-16 and durvalumab from 08/23/2018 through 10/28/2018 -CT CAP on 10/24/2018 showed marked interval decrease in bulk of mediastinal, supraclavicular and axillary adenopathy. -Maintenance durvalumab 1500 mg every 4 weeks started on 11/20/2018. - CT CAP on 01/14/2019 showed continued further decrease in mediastinal, hilar and axillary adenopathy.  New tiny pericardial effusion and new small right pleural  effusion. -He denies any immunotherapy related side effects.  No diarrhea or skin rashes reported. -He will continue with Imfinzi today.  We will reevaluate him in 4 weeks. -We will plan to repeat scans 3 to 4 months after the last.  2.  Bladder mass: -CT scan showed incidental polypoid lesion in the left posterior bladder wall measuring 15 mm. - Ultrasound of the bladder did not show any masses.  3.  Brain metastasis: -MRI of the brain on 08/22/2018 showed 3 small subcentimeter metastasis in the right insula, right occipital lobe and right cerebellar hemisphere. -WB RT from 09/02/2018 through 09/13/2018. - MRI of the brain on 12/13/2018 showed stable exam with 3 successfully treated brain metastasis.  4.  Weight loss: -He lost about 6 pounds in the last 4 weeks. - He is drinking boost 3 cans a  day.  He is having some loose bowel movements.  He is not eating solid foods. - He did report occasional blood in the stool last week. -Colonoscopy was in June 2012 showing internal hemorrhoids.  He was told to eat foods rich in fiber. - I will start him on Remeron 15 mg at bedtime, increase it to 30 mg as tolerated.  Total time spent is 25 minutes with more than 50% of the time spent face-to-face discussing maintenance immunotherapy, side effects and coordination of care.    Orders placed this encounter:  No orders of the defined types were placed in this encounter.     Derek Jack, MD [place] [phone]

## 2019-02-13 NOTE — Progress Notes (Signed)
Tolerated infusion w/o adverse reaction.  Alert, in no distress.  VSS.  Discharged ambulatory.  

## 2019-02-13 NOTE — Assessment & Plan Note (Signed)
1.  Extensive stage small cell lung cancer: - 4 cycles of carboplatin, VP-16 and durvalumab from 08/23/2018 through 10/28/2018 -CT CAP on 10/24/2018 showed marked interval decrease in bulk of mediastinal, supraclavicular and axillary adenopathy. -Maintenance durvalumab 1500 mg every 4 weeks started on 11/20/2018. - CT CAP on 01/14/2019 showed continued further decrease in mediastinal, hilar and axillary adenopathy.  New tiny pericardial effusion and new small right pleural effusion. -He denies any immunotherapy related side effects.  No diarrhea or skin rashes reported. -He will continue with Imfinzi today.  We will reevaluate him in 4 weeks. -We will plan to repeat scans 3 to 4 months after the last.  2.  Bladder mass: -CT scan showed incidental polypoid lesion in the left posterior bladder wall measuring 15 mm. - Ultrasound of the bladder did not show any masses.  3.  Brain metastasis: -MRI of the brain on 08/22/2018 showed 3 small subcentimeter metastasis in the right insula, right occipital lobe and right cerebellar hemisphere. -WB RT from 09/02/2018 through 09/13/2018. - MRI of the brain on 12/13/2018 showed stable exam with 3 successfully treated brain metastasis.  4.  Weight loss: -He lost about 6 pounds in the last 4 weeks. - He is drinking boost 3 cans a day.  He is having some loose bowel movements.  He is not eating solid foods. - He did report occasional blood in the stool last week. -Colonoscopy was in June 2012 showing internal hemorrhoids.  He was told to eat foods rich in fiber. - I will start him on Remeron 15 mg at bedtime, increase it to 30 mg as tolerated.

## 2019-02-13 NOTE — Patient Instructions (Addendum)
[place] at [facility] Discharge Instructions  You were seen today by Dr. Delton Coombes. He went over your recent lab and test results. He will see you back in 4 weeks for labs and follow up.   Thank you for choosing [place] at [facility] to provide your oncology and hematology care.  To afford each patient quality time with our provider, please arrive at least 15 minutes before your scheduled appointment time.   If you have a lab appointment with the [facility] please come in thru the  Main Entrance and check in at the main information desk  You need to re-schedule your appointment should you arrive 10 or more minutes late.  We strive to give you quality time with our providers, and arriving late affects you and other patients whose appointments are after yours.  Also, if you no show three or more times for appointments you may be dismissed from the clinic at the providers discretion.     Again, thank you for choosing [facility].  Our hope is that these requests will decrease the amount of time that you wait before being seen by our physicians.       _____________________________________________________________  Should you have questions after your visit to [facility], please contact our office at [phone] between the hours of 8:00 a.m. and 4:30 p.m.  Voicemails left after 4:00 p.m. will not be returned until the following business day.  For prescription refill requests, have your pharmacy contact our office and allow 72 hours.    Cancer Center Support Programs:   > Cancer Support Group  2nd Tuesday of the month 1pm-2pm, Journey Room

## 2019-03-13 ENCOUNTER — Encounter (HOSPITAL_COMMUNITY): Payer: Self-pay | Admitting: Hematology

## 2019-03-13 ENCOUNTER — Inpatient Hospital Stay (HOSPITAL_COMMUNITY): Payer: Medicare Other | Attending: Hematology

## 2019-03-13 ENCOUNTER — Inpatient Hospital Stay (HOSPITAL_BASED_OUTPATIENT_CLINIC_OR_DEPARTMENT_OTHER): Payer: Medicare Other | Admitting: Hematology

## 2019-03-13 ENCOUNTER — Inpatient Hospital Stay (HOSPITAL_COMMUNITY): Payer: Medicare Other

## 2019-03-13 ENCOUNTER — Other Ambulatory Visit: Payer: Self-pay

## 2019-03-13 DIAGNOSIS — R5383 Other fatigue: Secondary | ICD-10-CM | POA: Insufficient documentation

## 2019-03-13 DIAGNOSIS — C771 Secondary and unspecified malignant neoplasm of intrathoracic lymph nodes: Secondary | ICD-10-CM | POA: Diagnosis present

## 2019-03-13 DIAGNOSIS — I10 Essential (primary) hypertension: Secondary | ICD-10-CM | POA: Diagnosis not present

## 2019-03-13 DIAGNOSIS — C3491 Malignant neoplasm of unspecified part of right bronchus or lung: Secondary | ICD-10-CM | POA: Diagnosis present

## 2019-03-13 DIAGNOSIS — C7931 Secondary malignant neoplasm of brain: Secondary | ICD-10-CM | POA: Insufficient documentation

## 2019-03-13 DIAGNOSIS — L570 Actinic keratosis: Secondary | ICD-10-CM | POA: Diagnosis not present

## 2019-03-13 DIAGNOSIS — J90 Pleural effusion, not elsewhere classified: Secondary | ICD-10-CM | POA: Insufficient documentation

## 2019-03-13 DIAGNOSIS — J449 Chronic obstructive pulmonary disease, unspecified: Secondary | ICD-10-CM | POA: Insufficient documentation

## 2019-03-13 DIAGNOSIS — E785 Hyperlipidemia, unspecified: Secondary | ICD-10-CM | POA: Insufficient documentation

## 2019-03-13 DIAGNOSIS — M1990 Unspecified osteoarthritis, unspecified site: Secondary | ICD-10-CM | POA: Diagnosis not present

## 2019-03-13 DIAGNOSIS — K529 Noninfective gastroenteritis and colitis, unspecified: Secondary | ICD-10-CM | POA: Insufficient documentation

## 2019-03-13 DIAGNOSIS — E119 Type 2 diabetes mellitus without complications: Secondary | ICD-10-CM | POA: Insufficient documentation

## 2019-03-13 DIAGNOSIS — I313 Pericardial effusion (noninflammatory): Secondary | ICD-10-CM | POA: Insufficient documentation

## 2019-03-13 DIAGNOSIS — R634 Abnormal weight loss: Secondary | ICD-10-CM | POA: Insufficient documentation

## 2019-03-13 LAB — CBC WITH DIFFERENTIAL/PLATELET
Abs Immature Granulocytes: 0.04 10*3/uL (ref 0.00–0.07)
Basophils Absolute: 0.1 10*3/uL (ref 0.0–0.1)
Basophils Relative: 1 %
Eosinophils Absolute: 0.6 10*3/uL — ABNORMAL HIGH (ref 0.0–0.5)
Eosinophils Relative: 6 %
HCT: 41.6 % (ref 39.0–52.0)
Hemoglobin: 13.6 g/dL (ref 13.0–17.0)
Immature Granulocytes: 0 %
Lymphocytes Relative: 12 %
Lymphs Abs: 1.2 10*3/uL (ref 0.7–4.0)
MCH: 32.2 pg (ref 26.0–34.0)
MCHC: 32.7 g/dL (ref 30.0–36.0)
MCV: 98.3 fL (ref 80.0–100.0)
Monocytes Absolute: 0.7 10*3/uL (ref 0.1–1.0)
Monocytes Relative: 7 %
Neutro Abs: 7.5 10*3/uL (ref 1.7–7.7)
Neutrophils Relative %: 74 %
Platelets: 330 10*3/uL (ref 150–400)
RBC: 4.23 MIL/uL (ref 4.22–5.81)
RDW: 13.7 % (ref 11.5–15.5)
WBC: 10 10*3/uL (ref 4.0–10.5)
nRBC: 0 % (ref 0.0–0.2)

## 2019-03-13 LAB — COMPREHENSIVE METABOLIC PANEL
ALT: 11 U/L (ref 0–44)
AST: 17 U/L (ref 15–41)
Albumin: 3.4 g/dL — ABNORMAL LOW (ref 3.5–5.0)
Alkaline Phosphatase: 124 U/L (ref 38–126)
Anion gap: 8 (ref 5–15)
BUN: 6 mg/dL — ABNORMAL LOW (ref 8–23)
CO2: 27 mmol/L (ref 22–32)
Calcium: 9.2 mg/dL (ref 8.9–10.3)
Chloride: 104 mmol/L (ref 98–111)
Creatinine, Ser: 0.63 mg/dL (ref 0.61–1.24)
GFR calc Af Amer: >60 mL/min (ref >60)
GFR calc non Af Amer: >60 mL/min (ref >60)
Glucose, Bld: 112 mg/dL — ABNORMAL HIGH (ref 70–99)
Potassium: 4.4 mmol/L (ref 3.5–5.1)
Sodium: 139 mmol/L (ref 135–145)
Total Bilirubin: 0.4 mg/dL (ref 0.3–1.2)
Total Protein: 6.5 g/dL (ref 6.5–8.1)

## 2019-03-13 LAB — MAGNESIUM: Magnesium: 2.1 mg/dL (ref 1.7–2.4)

## 2019-03-13 MED ORDER — PREDNISONE 20 MG PO TABS: 60.0000 mg | ORAL_TABLET | Freq: Every day | ORAL | 0 refills | Status: DC

## 2019-03-13 NOTE — Progress Notes (Signed)
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[place])  08/22/2018 Initial Diagnosis   Small cell carcinoma of lung, right ([place])   08/23/2018 - 11/17/2018 Chemotherapy   The patient had palonosetron (ALOXI) injection 0.25 mg, 0.25 mg, Intravenous,  Once, 4 of 4 cycles Administration: 0.25 mg (08/23/2018), 0.25 mg (09/16/2018), 0.25 mg (10/07/2018), 0.25 mg (10/28/2018) pegfilgrastim-cbqv (UDENYCA) injection 6 mg, 6 mg, Subcutaneous, Once, 4 of 4 cycles Administration: 6 mg (08/27/2018), 6 mg (09/20/2018), 6 mg (10/11/2018), 6 mg (11/01/2018) CARBOplatin (PARAPLATIN) 560 mg in sodium chloride 0.9 % 250 mL chemo infusion, 560 mg (100 % of original dose 564 mg), Intravenous,  Once, 4 of 4 cycles Dose modification:   (original dose 564 mg, Cycle 1),   (original dose 523.5 mg, Cycle 3),   (original dose 523.5 mg, Cycle 4) Administration: 560 mg (08/23/2018), 560 mg (09/16/2018), 520 mg (10/07/2018), 510 mg (10/28/2018) etoposide (VEPESID) 220 mg in sodium chloride 0.9 % 1,000 mL chemo infusion, 100 mg/m2 = 220 mg, Intravenous,  Once, 4 of 4 cycles Administration: 220 mg (08/23/2018), 200 mg (09/16/2018), 200 mg (09/17/2018), 200 mg (09/18/2018), 200 mg (10/07/2018), 200 mg (10/08/2018), 200 mg (10/09/2018), 200 mg (10/28/2018), 200 mg (10/29/2018), 200 mg (10/30/2018) fosaprepitant (EMEND) 150 mg, dexamethasone (DECADRON) 12 mg in sodium chloride 0.9 % 145 mL IVPB, , Intravenous,  Once, 4 of 4 cycles Administration:  (08/23/2018),  (09/16/2018),  (10/07/2018),  (10/28/2018) durvalumab (IMFINZI) 1,500 mg in sodium chloride 0.9  % 100 mL chemo infusion, 1,500 mg (100 % of original dose 1,500 mg), Intravenous,  Once, 3 of 3 cycles Dose modification: 1,500 mg (original dose 1,500 mg, Cycle 2, Reason: Provider Judgment) Administration: 1,500 mg (09/16/2018), 1,500 mg (10/07/2018), 1,500 mg (10/28/2018)  for chemotherapy treatment.    Small cell carcinoma of lung, right ([place])  08/23/2018 - 11/17/2018 Chemotherapy   The patient had palonosetron (ALOXI) injection 0.25 mg, 0.25 mg, Intravenous,  Once, 4 of 4 cycles Administration: 0.25 mg (08/23/2018), 0.25 mg (09/16/2018), 0.25 mg (10/07/2018), 0.25 mg (10/28/2018) pegfilgrastim-cbqv (UDENYCA) injection 6 mg, 6 mg, Subcutaneous, Once, 4 of 4 cycles Administration: 6 mg (08/27/2018), 6 mg (09/20/2018), 6 mg (10/11/2018), 6 mg (11/01/2018) CARBOplatin (PARAPLATIN) 560 mg in sodium chloride 0.9 % 250 mL chemo infusion, 560 mg (100 % of original dose 564 mg), Intravenous,  Once, 4 of 4 cycles Dose modification:   (original dose 564 mg, Cycle 1),   (original dose 523.5 mg, Cycle 3),   (original dose 523.5 mg, Cycle 4) Administration: 560 mg (08/23/2018), 560 mg (09/16/2018), 520 mg (10/07/2018), 510 mg (10/28/2018) etoposide (VEPESID) 220 mg in sodium chloride 0.9 % 1,000 mL chemo infusion, 100 mg/m2 = 220 mg, Intravenous,  Once, 4 of 4 cycles Administration: 220 mg (08/23/2018), 200 mg (09/16/2018), 200 mg (09/17/2018), 200 mg (09/18/2018), 200 mg (10/07/2018), 200 mg (10/08/2018), 200 mg (10/09/2018), 200 mg (10/28/2018), 200 mg (10/29/2018), 200 mg (10/30/2018) fosaprepitant (EMEND) 150 mg, dexamethasone (DECADRON) 12 mg in sodium chloride 0.9 % 145 mL IVPB, , Intravenous,  Once, 4 of 4 cycles Administration:  (08/23/2018),  (09/16/2018),  (10/07/2018),  (10/28/2018) durvalumab (IMFINZI) 1,500 mg  in sodium chloride 0.9 % 100 mL chemo infusion, 1,500 mg (100 % of original dose 1,500 mg), Intravenous,  Once, 3 of 3 cycles Dose modification: 1,500 mg (original dose 1,500 mg, Cycle 2, Reason: Provider Judgment)  Administration: 1,500 mg (09/16/2018), 1,500 mg (10/07/2018), 1,500 mg (10/28/2018)  for chemotherapy treatment.    08/26/2018 Initial Diagnosis   Small cell carcinoma of lung, right ([place])   11/20/2018 -  Chemotherapy   The patient had durvalumab (IMFINZI) 1,500 mg in sodium chloride 0.9 % 100 mL chemo infusion, 1,500 mg (100 % of original dose 1,500 mg), Intravenous,  Once, 4 of 9 cycles Dose modification: 1,500 mg (original dose 1,500 mg, Cycle 1, Reason: Other (see comments)) Administration: 1,500 mg (11/20/2018), 1,500 mg (12/19/2018), 1,500 mg (01/16/2019), 1,500 mg (02/13/2019)  for chemotherapy treatment.       CANCER STAGING: Cancer Staging No matching staging information was found for the patient.   INTERVAL HISTORY:  [name] 73 y.o. male seen for follow-up and possible immunotherapy.  He is receiving durvalumab every 4 weeks for small cell lung cancer.  He reported watery diarrhea, up to 5 stools per day, very small in quantity.  He also reported some blood associated with it.  He has noticed this change in bowel habits in the last 3 to 4 weeks.  He is drinking about 3 to 4 cans of boost per day.  He is not able to eat any solid foods.  He lost about 4 pounds since last 4 weeks.  Denies any dry cough.  Denies any skin rashes.  He is fatigued because of diarrhea.  Appetite is still low.  He is taking Remeron 15 mg daily.  Energy levels are 25%.  REVIEW OF SYSTEMS:  Review of Systems  Constitutional: Positive for fatigue.  Gastrointestinal: Positive for blood in stool and diarrhea. Negative for constipation.  Neurological: Negative for dizziness.  All other systems reviewed and are negative.    PAST MEDICAL/SURGICAL HISTORY:  Past Medical History:  Diagnosis Date  . Actinic keratosis   . Arthritis   . Colitis MAY 2012    CT ABD/PELVIS HEP FLEXURE  . COPD (chronic obstructive pulmonary disease) ([place])   . Diabetes mellitus without complication ([place])   . History of kidney stones    . Hyperlipemia   . Hypertension   . NSAID long-term use NAPROXEN FOR OA  . SCL Ca dx'd 08/2018   Past Surgical History:  Procedure Laterality Date  . BACK SURGERY     spinal stenosis  . BASAL CELL CARCINOMA EXCISION  FACE, arms feet, leg  . CHOLECYSTECTOMY  JUNE 2011 MJ   STONES, PANCREATITIS  . KNEE ARTHROSCOPY WITH MEDIAL MENISECTOMY Right 03/24/2015   Procedure: KNEE ARTHROSCOPY WITH MEDIAL MENISECTOMY;  Surgeon: Carole Civil, MD;  Location: [facility];  Service: Orthopedics;  Laterality: Right;  . KNEE SURGERY Left LEFT   arthroscopy  . LITHOTRIPSY  80s  . PORTACATH PLACEMENT Left 09/11/2018   Procedure: INSERTION PORT-A-CATH;  Surgeon: Virl Cagey, MD;  Location: [facility];  Service: General;  Laterality: Left;  . SPINE SURGERY       SOCIAL HISTORY:  Social History   Socioeconomic History  . Marital status: Divorced    Spouse name: Not on file  . Number of children: 0  . Years of education: Not on file  . Highest education level: Not on file  Occupational History  . Occupation: [employer]   . Occupation: Manufacturing engineer   Social  Needs  . Financial resource strain: Not hard at all  . Food insecurity    Worry: Never true    Inability: Never true  . Transportation needs    Medical: No    Non-medical: No  Tobacco Use  . Smoking status: Former Smoker    Packs/day: 1.00    Years: 50.00    Pack years: 50.00    Types: Cigarettes    Quit date: 08/07/2018    Years since quitting: 0.5  . Smokeless tobacco: Never Used  Substance and Sexual Activity  . Alcohol use: Yes    Comment: once a month  . Drug use: No  . Sexual activity: Never    Birth control/protection: None  Lifestyle  . Physical activity    Days per week: 0 days    Minutes per session: 0 min  . Stress: Not at all  Relationships  . Social Herbalist on phone: Never    Gets together: More than three times a week    Attends religious service: Never    Active member of club or  organization: Yes    Attends meetings of clubs or organizations: More than 4 times per year    Relationship status: Divorced  . Intimate partner violence    Fear of current or ex partner: No    Emotionally abused: No    Physically abused: No    Forced sexual activity: No  Other Topics Concern  . 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    FAMILY HISTORY:  Family History  Problem Relation Age of Onset  . Cancer Mother        lung  . Cancer Father        lung and liver  . Colon polyps Neg Hx   . Colon cancer Neg Hx     CURRENT MEDICATIONS:  Outpatient Encounter Medications as of 03/13/2019  Medication Sig Note  . albuterol (PROVENTIL HFA;VENTOLIN HFA) 108 (90 Base) MCG/ACT inhaler Inhale 2 puffs into the lungs as needed.    Marland Kitchen aspirin 81 MG tablet Take 81 mg by mouth daily.     . Cholecalciferol 50 MCG (2000 UT) TBDP Take 1 tablet by mouth daily.   [place] (IMFINZI IV) Inject into the vein every 21 ( twenty-one) days.   Marland Kitchen HYDROcodone-acetaminophen (NORCO) 5-325 MG tablet Take 1 tablet by mouth every 4 (four) hours as needed for moderate pain.   . metFORMIN (GLUCOPHAGE) 500 MG tablet Take 500 mg by mouth 2 (two) times daily with a meal.   . mirtazapine (REMERON) 15 MG tablet Take 1 tablet (15 mg total) by mouth at bedtime.   . Multiple Vitamin (MULTIVITAMIN) tablet Take 1 tablet by mouth daily.   . naproxen (NAPROSYN) 250 MG tablet Take 250 mg by mouth as needed.   . ondansetron (ZOFRAN) 8 MG tablet Take 1 tablet (8 mg total) by mouth every 8 (eight) hours as needed for nausea or vomiting.   . rosuvastatin (CRESTOR) 10 MG tablet Take 10 mg by mouth daily.     Marland Kitchen scopolamine (TRANSDERM-SCOP) 1 MG/3DAYS Place 1 patch (1.5 mg total) onto the skin every 3 (three) days.   . sucralfate (CARAFATE) 1 g tablet Take 1 tablet (1 g total) by mouth 4 (four) times daily.   . tamsulosin  (FLOMAX) 0.4 MG CAPS capsule Take 0.4 mg  by mouth daily.   . verapamil (COVERA HS) 240 MG (CO) 24 hr tablet Take 240 mg by mouth at bedtime.   12/19/2018: Taking 120 mg daily/  . predniSONE (DELTASONE) 20 MG tablet Take 3 tablets (60 mg total) by mouth daily with breakfast.    No facility-administered encounter medications on file as of 03/13/2019.     ALLERGIES:  No Known Allergies   PHYSICAL EXAM:  ECOG Performance status: 1  Vitals:   03/13/19 0900  BP: 111/80  Pulse: 94  Resp: 16  Temp: 98.2 F (36.8 C)  SpO2: 99%   Filed Weights   03/13/19 0900  Weight: 158 lb 4 oz (71.8 kg)    Physical Exam Vitals signs reviewed.  Constitutional:      Appearance: Normal appearance.  Cardiovascular:     Rate and Rhythm: Normal rate and regular rhythm.     Heart sounds: Normal heart sounds.  Pulmonary:     Effort: Pulmonary effort is normal.     Breath sounds: Normal breath sounds.  Abdominal:     General: There is no distension.     Palpations: Abdomen is soft. There is mass.  Musculoskeletal:        General: No swelling.  Lymphadenopathy:     Cervical: Cervical adenopathy present.  Skin:    General: Skin is warm.  Neurological:     General: No focal deficit present.     Mental Status: He is alert and oriented to person, place, and time.  Psychiatric:        Mood and Affect: Mood normal.        Behavior: Behavior normal.      LABORATORY DATA:  I have reviewed the labs as listed.  CBC    Component Value Date/Time   WBC 10.0 03/13/2019 0950   RBC 4.23 03/13/2019 0950   HGB 13.6 03/13/2019 0950   HCT 41.6 03/13/2019 0950   PLT 330 03/13/2019 0950   MCV 98.3 03/13/2019 0950   MCH 32.2 03/13/2019 0950   MCHC 32.7 03/13/2019 0950   RDW 13.7 03/13/2019 0950   LYMPHSABS 1.2 03/13/2019 0950   MONOABS 0.7 03/13/2019 0950   EOSABS 0.6 (H) 03/13/2019 0950   BASOSABS 0.1 03/13/2019 0950   CMP Latest Ref Rng & Units 03/13/2019 02/13/2019 01/16/2019  Glucose 70 - 99 mg/dL  112(H) 129(H) 145(H)  BUN 8 - 23 mg/dL 6(L) 10 12  Creatinine 0.61 - 1.24 mg/dL 0.63 0.61 0.67  Sodium 135 - 145 mmol/L 139 139 139  Potassium 3.5 - 5.1 mmol/L 4.4 5.0 4.3  Chloride 98 - 111 mmol/L 104 102 101  CO2 22 - 32 mmol/L 27 28 29   Calcium 8.9 - 10.3 mg/dL 9.2 9.6 9.8  Total Protein 6.5 - 8.1 g/dL 6.5 6.4(L) 6.6  Total Bilirubin 0.3 - 1.2 mg/dL 0.4 0.7 0.7  Alkaline Phos 38 - 126 U/L 124 90 85  AST 15 - 41 U/L 17 15 16   ALT 0 - 44 U/L 11 10 11        DIAGNOSTIC IMAGING:  I have independently reviewed the scans and discussed with the patient.      ASSESSMENT & PLAN:   Small cell carcinoma of lung, right ([place]) 1.  Extensive stage small cell lung cancer: -4 cycles of carboplatin, with 16 and durvalumab from 08/23/2018 through 10/28/2018. - CT CAP on 01/14/2019 showed continued further decrease in mediastinal, hilar, axillary adenopathy.  New tiny pericardial effusion and small right pleural effusion. - Last treatment  with durvalumab was on 02/13/2019. -He reported having 5 watery small bowel movements for the last 3 weeks.  There is also some blood, minimal amount with each bowel movement.  He did give a history of hemorrhoids in the past. -Last colonoscopy was in June 2012, showing internal hemorrhoids. - There is a clinical suspicion for colitis at this time.  I will hold his durvalumab today.  I will start him on prednisone 60 mg daily.  I will reevaluate him in 1 week.  2.  Weight loss: - He lost 4 pounds since last visit 3 weeks ago.  He is continuing to lose weight. -I have suggested him to increase Remeron to 30 mg at bedtime.  He has not done it yet.  He will do from today onwards. -He is drinking about 3 to 4 cans of boost per day.  He is not eating much of solid foods.  3.  Brain metastasis: -MRI of the brain on 08/22/2018 showed 3 small subcentimeter metastasis in the right insula, right occipital lobe and right cerebellar hemisphere. -WBRT from 09/02/2018 through  09/13/2018. -MRI of the brain on 12/13/2018 showed stable exam with 3 successfully treated brain lesions.  Total time spent is 25 minutes with more than 50% of the time spent face-to-face discussing maintenance immunotherapy, side effects and coordination of care.    Orders placed this encounter:  No orders of the defined types were placed in this encounter.     Derek Jack, MD [place] [phone]

## 2019-03-13 NOTE — Progress Notes (Signed)
No treatment today per MD.  

## 2019-03-13 NOTE — Patient Instructions (Addendum)
[facility] Discharge Instructions  You were seen today by Dr. Katragadda. He went over your recent lab results. He will see you back in 1 week for labs and follow up.   Thank you for choosing [facility] at [facility] to provide your oncology and hematology care.  To afford each patient quality time with our provider, please arrive at least 15 minutes before your scheduled appointment time.   If you have a lab appointment with the Cancer Center please come in thru the  Main Entrance and check in at the main information desk  You need to re-schedule your appointment should you arrive 10 or more minutes late.  We strive to give you quality time with our providers, and arriving late affects you and other patients whose appointments are after yours.  Also, if you no show three or more times for appointments you may be dismissed from the clinic at the providers discretion.     Again, thank you for choosing [facility].  Our hope is that these requests will decrease the amount of time that you wait before being seen by our physicians.       _____________________________________________________________  Should you have questions after your visit to [facility], please contact our office at [phone] between the hours of 8:00 a.m. and 4:30 p.m.  Voicemails left after 4:00 p.m. will not be returned until the following business day.  For prescription refill requests, have your pharmacy contact our office and allow 72 hours.    Cancer Center Support Programs:   > Cancer Support Group  2nd Tuesday of the month 1pm-2pm, Journey Room    

## 2019-03-13 NOTE — Assessment & Plan Note (Signed)
1.  Extensive stage small cell lung cancer: -4 cycles of carboplatin, with 16 and durvalumab from 08/23/2018 through 10/28/2018. - CT CAP on 01/14/2019 showed continued further decrease in mediastinal, hilar, axillary adenopathy.  New tiny pericardial effusion and small right pleural effusion. - Last treatment with durvalumab was on 02/13/2019. -He reported having 5 watery small bowel movements for the last 3 weeks.  There is also some blood, minimal amount with each bowel movement.  He did give a history of hemorrhoids in the past. -Last colonoscopy was in June 2012, showing internal hemorrhoids. - There is a clinical suspicion for colitis at this time.  I will hold his durvalumab today.  I will start him on prednisone 60 mg daily.  I will reevaluate him in 1 week.  2.  Weight loss: - He lost 4 pounds since last visit 3 weeks ago.  He is continuing to lose weight. -I have suggested him to increase Remeron to 30 mg at bedtime.  He has not done it yet.  He will do from today onwards. -He is drinking about 3 to 4 cans of boost per day.  He is not eating much of solid foods.  3.  Brain metastasis: -MRI of the brain on 08/22/2018 showed 3 small subcentimeter metastasis in the right insula, right occipital lobe and right cerebellar hemisphere. -WBRT from 09/02/2018 through 09/13/2018. -MRI of the brain on 12/13/2018 showed stable exam with 3 successfully treated brain lesions.

## 2019-03-19 ENCOUNTER — Inpatient Hospital Stay (HOSPITAL_BASED_OUTPATIENT_CLINIC_OR_DEPARTMENT_OTHER): Payer: Medicare Other | Admitting: Hematology

## 2019-03-19 ENCOUNTER — Inpatient Hospital Stay (HOSPITAL_COMMUNITY): Payer: Medicare Other

## 2019-03-19 ENCOUNTER — Other Ambulatory Visit: Payer: Self-pay

## 2019-03-19 DIAGNOSIS — C3491 Malignant neoplasm of unspecified part of right bronchus or lung: Secondary | ICD-10-CM

## 2019-03-19 LAB — COMPREHENSIVE METABOLIC PANEL
ALT: 24 U/L (ref 0–44)
AST: 20 U/L (ref 15–41)
Albumin: 3.4 g/dL — ABNORMAL LOW (ref 3.5–5.0)
Alkaline Phosphatase: 76 U/L (ref 38–126)
Anion gap: 10 (ref 5–15)
BUN: 15 mg/dL (ref 8–23)
CO2: 30 mmol/L (ref 22–32)
Calcium: 9.5 mg/dL (ref 8.9–10.3)
Chloride: 99 mmol/L (ref 98–111)
Creatinine, Ser: 0.62 mg/dL (ref 0.61–1.24)
GFR calc Af Amer: >60 mL/min (ref >60)
GFR calc non Af Amer: >60 mL/min (ref >60)
Glucose, Bld: 109 mg/dL — ABNORMAL HIGH (ref 70–99)
Potassium: 4.5 mmol/L (ref 3.5–5.1)
Sodium: 139 mmol/L (ref 135–145)
Total Bilirubin: 0.6 mg/dL (ref 0.3–1.2)
Total Protein: 6.2 g/dL — ABNORMAL LOW (ref 6.5–8.1)

## 2019-03-19 LAB — CBC WITH DIFFERENTIAL/PLATELET
Abs Immature Granulocytes: 0.2 10*3/uL — ABNORMAL HIGH (ref 0.00–0.07)
Basophils Absolute: 0 10*3/uL (ref 0.0–0.1)
Basophils Relative: 0 %
Eosinophils Absolute: 0.1 10*3/uL (ref 0.0–0.5)
Eosinophils Relative: 1 %
HCT: 39.8 % (ref 39.0–52.0)
Hemoglobin: 13 g/dL (ref 13.0–17.0)
Immature Granulocytes: 2 %
Lymphocytes Relative: 9 %
Lymphs Abs: 1.2 10*3/uL (ref 0.7–4.0)
MCH: 32.2 pg (ref 26.0–34.0)
MCHC: 32.7 g/dL (ref 30.0–36.0)
MCV: 98.5 fL (ref 80.0–100.0)
Monocytes Absolute: 1 10*3/uL (ref 0.1–1.0)
Monocytes Relative: 7 %
Neutro Abs: 10.7 10*3/uL — ABNORMAL HIGH (ref 1.7–7.7)
Neutrophils Relative %: 81 %
Platelets: 348 10*3/uL (ref 150–400)
RBC: 4.04 MIL/uL — ABNORMAL LOW (ref 4.22–5.81)
RDW: 14.2 % (ref 11.5–15.5)
WBC: 13.1 10*3/uL — ABNORMAL HIGH (ref 4.0–10.5)
nRBC: 0.2 % (ref 0.0–0.2)

## 2019-03-19 LAB — MAGNESIUM: Magnesium: 2.1 mg/dL (ref 1.7–2.4)

## 2019-03-19 NOTE — Progress Notes (Signed)
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[place])  08/22/2018 Initial Diagnosis   Small cell carcinoma of lung, right ([place])   08/23/2018 - 11/17/2018 Chemotherapy   The patient had palonosetron (ALOXI) injection 0.25 mg, 0.25 mg, Intravenous,  Once, 4 of 4 cycles Administration: 0.25 mg (08/23/2018), 0.25 mg (09/16/2018), 0.25 mg (10/07/2018), 0.25 mg (10/28/2018) pegfilgrastim-cbqv (UDENYCA) injection 6 mg, 6 mg, Subcutaneous, Once, 4 of 4 cycles Administration: 6 mg (08/27/2018), 6 mg (09/20/2018), 6 mg (10/11/2018), 6 mg (11/01/2018) CARBOplatin (PARAPLATIN) 560 mg in sodium chloride 0.9 % 250 mL chemo infusion, 560 mg (100 % of original dose 564 mg), Intravenous,  Once, 4 of 4 cycles Dose modification:   (original dose 564 mg, Cycle 1),   (original dose 523.5 mg, Cycle 3),   (original dose 523.5 mg, Cycle 4) Administration: 560 mg (08/23/2018), 560 mg (09/16/2018), 520 mg (10/07/2018), 510 mg (10/28/2018) etoposide (VEPESID) 220 mg in sodium chloride 0.9 % 1,000 mL chemo infusion, 100 mg/m2 = 220 mg, Intravenous,  Once, 4 of 4 cycles Administration: 220 mg (08/23/2018), 200 mg (09/16/2018), 200 mg (09/17/2018), 200 mg (09/18/2018), 200 mg (10/07/2018), 200 mg (10/08/2018), 200 mg (10/09/2018), 200 mg (10/28/2018), 200 mg (10/29/2018), 200 mg (10/30/2018) fosaprepitant (EMEND) 150 mg, dexamethasone (DECADRON) 12 mg in sodium chloride 0.9 % 145 mL IVPB, , Intravenous,  Once, 4 of 4 cycles Administration:  (08/23/2018),  (09/16/2018),  (10/07/2018),  (10/28/2018) durvalumab (IMFINZI) 1,500 mg in sodium chloride 0.9  % 100 mL chemo infusion, 1,500 mg (100 % of original dose 1,500 mg), Intravenous,  Once, 3 of 3 cycles Dose modification: 1,500 mg (original dose 1,500 mg, Cycle 2, Reason: Provider Judgment) Administration: 1,500 mg (09/16/2018), 1,500 mg (10/07/2018), 1,500 mg (10/28/2018)  for chemotherapy treatment.    Small cell carcinoma of lung, right ([place])  08/23/2018 - 11/17/2018 Chemotherapy   The patient had palonosetron (ALOXI) injection 0.25 mg, 0.25 mg, Intravenous,  Once, 4 of 4 cycles Administration: 0.25 mg (08/23/2018), 0.25 mg (09/16/2018), 0.25 mg (10/07/2018), 0.25 mg (10/28/2018) pegfilgrastim-cbqv (UDENYCA) injection 6 mg, 6 mg, Subcutaneous, Once, 4 of 4 cycles Administration: 6 mg (08/27/2018), 6 mg (09/20/2018), 6 mg (10/11/2018), 6 mg (11/01/2018) CARBOplatin (PARAPLATIN) 560 mg in sodium chloride 0.9 % 250 mL chemo infusion, 560 mg (100 % of original dose 564 mg), Intravenous,  Once, 4 of 4 cycles Dose modification:   (original dose 564 mg, Cycle 1),   (original dose 523.5 mg, Cycle 3),   (original dose 523.5 mg, Cycle 4) Administration: 560 mg (08/23/2018), 560 mg (09/16/2018), 520 mg (10/07/2018), 510 mg (10/28/2018) etoposide (VEPESID) 220 mg in sodium chloride 0.9 % 1,000 mL chemo infusion, 100 mg/m2 = 220 mg, Intravenous,  Once, 4 of 4 cycles Administration: 220 mg (08/23/2018), 200 mg (09/16/2018), 200 mg (09/17/2018), 200 mg (09/18/2018), 200 mg (10/07/2018), 200 mg (10/08/2018), 200 mg (10/09/2018), 200 mg (10/28/2018), 200 mg (10/29/2018), 200 mg (10/30/2018) fosaprepitant (EMEND) 150 mg, dexamethasone (DECADRON) 12 mg in sodium chloride 0.9 % 145 mL IVPB, , Intravenous,  Once, 4 of 4 cycles Administration:  (08/23/2018),  (09/16/2018),  (10/07/2018),  (10/28/2018) durvalumab (IMFINZI) 1,500 mg  in sodium chloride 0.9 % 100 mL chemo infusion, 1,500 mg (100 % of original dose 1,500 mg), Intravenous,  Once, 3 of 3 cycles Dose modification: 1,500 mg (original dose 1,500 mg, Cycle 2, Reason: Provider Judgment)  Administration: 1,500 mg (09/16/2018), 1,500 mg (10/07/2018), 1,500 mg (10/28/2018)  for chemotherapy treatment.    08/26/2018 Initial Diagnosis   Small cell carcinoma of lung, right ([place])   11/20/2018 -  Chemotherapy   The patient had durvalumab (IMFINZI) 1,500 mg in sodium chloride 0.9 % 100 mL chemo infusion, 1,500 mg (100 % of original dose 1,500 mg), Intravenous,  Once, 4 of 9 cycles Dose modification: 1,500 mg (original dose 1,500 mg, Cycle 1, Reason: Other (see comments)) Administration: 1,500 mg (11/20/2018), 1,500 mg (12/19/2018), 1,500 mg (01/16/2019), 1,500 mg (02/13/2019)  for chemotherapy treatment.       CANCER STAGING: Cancer Staging No matching staging information was found for the patient.   INTERVAL HISTORY:  [name] 73 y.o. male seen for follow-up of small cell lung cancer.  We have held his immunotherapy last week secondary to colitis.  He was complaining of watery bowel movements up to 5 times per day with blood.  I started him on prednisone 60 mg on 03/13/2019.  Since then diarrhea has improved.  He is having 1 bowel movement per day.  Very minimal amount of blood was seen.  He is eating better.  He gained about 9 pounds.  Pain all over the body is rated as 4 out of 10 and stable.  Appetite is 50%.  Energy levels are 75%.  Mild fatigue present.   REVIEW OF SYSTEMS:  Review of Systems  Constitutional: Positive for fatigue.  Gastrointestinal: Positive for blood in stool.  Neurological: Negative for dizziness.  All other systems reviewed and are negative.    PAST MEDICAL/SURGICAL HISTORY:  Past Medical History:  Diagnosis Date  . Actinic keratosis   . Arthritis   . Colitis MAY 2012    CT ABD/PELVIS HEP FLEXURE  . COPD (chronic obstructive pulmonary disease) ([place])   . Diabetes mellitus without complication ([place])   . History of kidney stones   . Hyperlipemia   . Hypertension   . NSAID long-term use NAPROXEN FOR OA  . SCL Ca dx'd 08/2018   Past Surgical History:   Procedure Laterality Date  . BACK SURGERY     spinal stenosis  . BASAL CELL CARCINOMA EXCISION  FACE, arms feet, leg  . CHOLECYSTECTOMY  JUNE 2011 MJ   STONES, PANCREATITIS  . KNEE ARTHROSCOPY WITH MEDIAL MENISECTOMY Right 03/24/2015   Procedure: KNEE ARTHROSCOPY WITH MEDIAL MENISECTOMY;  Surgeon: Carole Civil, MD;  Location: [facility];  Service: Orthopedics;  Laterality: Right;  . KNEE SURGERY Left LEFT   arthroscopy  . LITHOTRIPSY  80s  . PORTACATH PLACEMENT Left 09/11/2018   Procedure: INSERTION PORT-A-CATH;  Surgeon: Virl Cagey, MD;  Location: [facility];  Service: General;  Laterality: Left;  . SPINE SURGERY       SOCIAL HISTORY:  Social History   Socioeconomic History  . Marital status: Divorced    Spouse name: Not on file  . Number of children: 0  . Years of education: Not on file  . Highest education level: Not on file  Occupational History  . Occupation: [place] [employer]   . Occupation: Manufacturing engineer   Social Needs  . Financial resource strain: Not hard at all  . Food insecurity    Worry: Never true    Inability:  Never true  . Transportation needs    Medical: No    Non-medical: No  Tobacco Use  . Smoking status: Former Smoker    Packs/day: 1.00    Years: 50.00    Pack years: 50.00    Types: Cigarettes    Quit date: 08/07/2018    Years since quitting: 0.6  . Smokeless tobacco: Never Used  Substance and Sexual Activity  . Alcohol use: Yes    Comment: once a month  . Drug use: No  . Sexual activity: Never    Birth control/protection: None  Lifestyle  . Physical activity    Days per week: 0 days    Minutes per session: 0 min  . Stress: Not at all  Relationships  . Social Herbalist on phone: Never    Gets together: More than three times a week    Attends religious service: Never    Active member of club or organization: Yes    Attends meetings of clubs or organizations: More than 4 times per year    Relationship status: Divorced  .  Intimate partner violence    Fear of current or ex partner: No    Emotionally abused: No    Physically abused: No    Forced sexual activity: No  Other Topics Concern  . 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    FAMILY HISTORY:  Family History  Problem Relation Age of Onset  . Cancer Mother        lung  . Cancer Father        lung and liver  . Colon polyps Neg Hx   . Colon cancer Neg Hx     CURRENT MEDICATIONS:  Outpatient Encounter Medications as of 03/19/2019  Medication Sig Note  . albuterol (PROVENTIL HFA;VENTOLIN HFA) 108 (90 Base) MCG/ACT inhaler Inhale 2 puffs into the lungs as needed.    Marland Kitchen aspirin 81 MG tablet Take 81 mg by mouth daily.     . Cholecalciferol 50 MCG (2000 UT) TBDP Take 1 tablet by mouth daily.   [place]is (IMFINZI IV) Inject into the vein every 21 ( twenty-one) days.   . metFORMIN (GLUCOPHAGE) 500 MG tablet Take 500 mg by mouth 2 (two) times daily with a meal.   . mirtazapine (REMERON) 15 MG tablet Take 1 tablet (15 mg total) by mouth at bedtime.   . Multiple Vitamin (MULTIVITAMIN) tablet Take 1 tablet by mouth daily.   . predniSONE (DELTASONE) 20 MG tablet Take 3 tablets (60 mg total) by mouth daily with breakfast.   . rosuvastatin (CRESTOR) 10 MG tablet Take 10 mg by mouth daily.     . verapamil (COVERA HS) 240 MG (CO) 24 hr tablet Take 240 mg by mouth at bedtime.   12/19/2018: Taking 120 mg daily/  . HYDROcodone-acetaminophen (NORCO) 5-325 MG tablet Take 1 tablet by mouth every 4 (four) hours as needed for moderate pain. (Patient not taking: Reported on 03/19/2019)   . naproxen (NAPROSYN) 250 MG tablet Take 250 mg by mouth as needed.   . ondansetron (ZOFRAN) 8 MG tablet Take 1 tablet (8 mg total) by mouth every 8 (eight) hours as needed for nausea or vomiting. (Patient not taking: Reported on 03/19/2019)   . scopolamine (TRANSDERM-SCOP) 1 MG/3DAYS  Place 1 patch (1.5 mg total) onto the skin every 3 (three) days. (  Patient not taking: Reported on 03/19/2019)   . sucralfate (CARAFATE) 1 g tablet Take 1 tablet (1 g total) by mouth 4 (four) times daily. (Patient not taking: Reported on 03/19/2019)   . tamsulosin (FLOMAX) 0.4 MG CAPS capsule Take 0.4 mg by mouth daily.    No facility-administered encounter medications on file as of 03/19/2019.     ALLERGIES:  No Known Allergies   PHYSICAL EXAM:  ECOG Performance status: 1  Vitals:   03/19/19 1011  BP: 130/78  Pulse: 86  Resp: 18  Temp: 97.9 F (36.6 C)  SpO2: 99%   Filed Weights   03/19/19 1011  Weight: 167 lb 6.4 oz (75.9 kg)    Physical Exam Vitals signs reviewed.  Constitutional:      Appearance: Normal appearance.  Cardiovascular:     Rate and Rhythm: Normal rate and regular rhythm.     Heart sounds: Normal heart sounds.  Pulmonary:     Effort: Pulmonary effort is normal.     Breath sounds: Normal breath sounds.  Abdominal:     General: There is no distension.     Palpations: Abdomen is soft. There is no mass.  Musculoskeletal:        General: No swelling.  Lymphadenopathy:     Cervical: No cervical adenopathy.  Skin:    General: Skin is warm.  Neurological:     General: No focal deficit present.     Mental Status: He is alert and oriented to person, place, and time.  Psychiatric:        Mood and Affect: Mood normal.        Behavior: Behavior normal.      LABORATORY DATA:  I have reviewed the labs as listed.  CBC    Component Value Date/Time   WBC 13.1 (H) 03/19/2019 0927   RBC 4.04 (L) 03/19/2019 0927   HGB 13.0 03/19/2019 0927   HCT 39.8 03/19/2019 0927   PLT 348 03/19/2019 0927   MCV 98.5 03/19/2019 0927   MCH 32.2 03/19/2019 0927   MCHC 32.7 03/19/2019 0927   RDW 14.2 03/19/2019 0927   LYMPHSABS 1.2 03/19/2019 0927   MONOABS 1.0 03/19/2019 0927   EOSABS 0.1 03/19/2019 0927   BASOSABS 0.0 03/19/2019 0927   CMP Latest Ref Rng & Units  03/19/2019 03/13/2019 02/13/2019  Glucose 70 - 99 mg/dL 109(H) 112(H) 129(H)  BUN 8 - 23 mg/dL 15 6(L) 10  Creatinine 0.61 - 1.24 mg/dL 0.62 0.63 0.61  Sodium 135 - 145 mmol/L 139 139 139  Potassium 3.5 - 5.1 mmol/L 4.5 4.4 5.0  Chloride 98 - 111 mmol/L 99 104 102  CO2 22 - 32 mmol/L 30 27 28   Calcium 8.9 - 10.3 mg/dL 9.5 9.2 9.6  Total Protein 6.5 - 8.1 g/dL 6.2(L) 6.5 6.4(L)  Total Bilirubin 0.3 - 1.2 mg/dL 0.6 0.4 0.7  Alkaline Phos 38 - 126 U/L 76 124 90  AST 15 - 41 U/L 20 17 15   ALT 0 - 44 U/L 24 11 10        DIAGNOSTIC IMAGING:  I have independently reviewed the scans and discussed with the patient.      ASSESSMENT & PLAN:   Small cell carcinoma of lung, right ([place]) 1.  Extensive stage small cell lung cancer: -4 cycles of carboplatin, with 16 and durvalumab from 08/23/2018 through 10/28/2018. - CT CAP on 01/14/2019 showed continued further decrease in mediastinal, hilar, axillary adenopathy.  New tiny pericardial effusion and small right pleural effusion. -  Last  durvalumab was on 02/13/2019. -Durvalumab held on 03/13/2019 for clinical suspicion of colitis.  Patient reported 5 watery stools per day.  Also blood present. -Prednisone 60 mg started on 03/13/2019. -he has noticed improvement with diarrhea in the last 1 week.  Blood in the stools has also improved. -I will cut back on prednisone to 40 mg daily.  I will reevaluate him in 1 week.   2.  Weight loss: - Since the start of prednisone 60 mg daily on 03/13/2019, he gained 9 pounds.  He is eating 3 meals per day and 3 snacks.  3.  Brain metastasis: -MRI of the brain on 08/22/2018 showed 3 small subcentimeter metastasis in the right insula, right occipital lobe and right cerebellar hemisphere. -WBRT from 09/02/2018 through 09/13/2018. -MRI of the brain on 12/13/2018 showed stable exam with 3 successfully treated brain lesions.  Total time spent is 25 minutes with more than 50% of the time spent face-to-face discussing maintenance  immunotherapy, side effects and coordination of care.    Orders placed this encounter:  No orders of the defined types were placed in this encounter.     Derek Jack, MD [place] [phone]

## 2019-03-19 NOTE — Progress Notes (Signed)
1050 Labs reviewed with and pt seen by Dr. Delton Coombes  and Imfinzi infusion to be held today per MD

## 2019-03-19 NOTE — Patient Instructions (Signed)
[place] at [facility] Discharge Instructions  You were seen today by Dr. Delton Coombes. He went over your recent results. He will see you back Tuesday for labs and follow up.   Thank you for choosing [place] at [facility] to provide your oncology and hematology care.  To afford each patient quality time with our provider, please arrive at least 15 minutes before your scheduled appointment time.   If you have a lab appointment with the [facility] please come in thru the  Main Entrance and check in at the main information desk  You need to re-schedule your appointment should you arrive 10 or more minutes late.  We strive to give you quality time with our providers, and arriving late affects you and other patients whose appointments are after yours.  Also, if you no show three or more times for appointments you may be dismissed from the clinic at the providers discretion.     Again, thank you for choosing [facility].  Our hope is that these requests will decrease the amount of time that you wait before being seen by our physicians.       _____________________________________________________________  Should you have questions after your visit to [facility], please contact our office at [phone] between the hours of 8:00 a.m. and 4:30 p.m.  Voicemails left after 4:00 p.m. will not be returned until the following business day.  For prescription refill requests, have your pharmacy contact our office and allow 72 hours.    Cancer Center Support Programs:   > Cancer Support Group  2nd Tuesday of the month 1pm-2pm, Journey Room

## 2019-03-20 ENCOUNTER — Encounter (HOSPITAL_COMMUNITY): Payer: Self-pay | Admitting: Hematology

## 2019-03-20 ENCOUNTER — Ambulatory Visit (HOSPITAL_COMMUNITY)
Admission: RE | Admit: 2019-03-20 | Discharge: 2019-03-20 | Disposition: A | Discharge status: Home / Self Care | Payer: Medicare Other | Source: Ambulatory Visit | Attending: Radiation Oncology | Admitting: Radiation Oncology

## 2019-03-20 ENCOUNTER — Other Ambulatory Visit (HOSPITAL_COMMUNITY): Payer: Medicare Other

## 2019-03-20 ENCOUNTER — Other Ambulatory Visit: Payer: Self-pay | Admitting: Radiation Therapy

## 2019-03-20 ENCOUNTER — Ambulatory Visit (HOSPITAL_COMMUNITY): Payer: Medicare Other

## 2019-03-20 ENCOUNTER — Ambulatory Visit (HOSPITAL_COMMUNITY): Payer: Medicare Other | Admitting: Hematology

## 2019-03-20 DIAGNOSIS — C7931 Secondary malignant neoplasm of brain: Secondary | ICD-10-CM | POA: Diagnosis present

## 2019-03-20 DIAGNOSIS — C7949 Secondary malignant neoplasm of other parts of nervous system: Secondary | ICD-10-CM | POA: Diagnosis present

## 2019-03-20 MED ORDER — GADOBUTROL 1 MMOL/ML IV SOLN
7.5000 mL | Freq: Once | INTRAVENOUS | Status: AC | PRN
  Administered 2019-03-20: 7.5 mL via INTRAVENOUS

## 2019-03-20 NOTE — Assessment & Plan Note (Signed)
1.  Extensive stage small cell lung cancer: -4 cycles of carboplatin, with 16 and durvalumab from 08/23/2018 through 10/28/2018. - CT CAP on 01/14/2019 showed continued further decrease in mediastinal, hilar, axillary adenopathy.  New tiny pericardial effusion and small right pleural effusion. - Last  durvalumab was on 02/13/2019. -Durvalumab held on 03/13/2019 for clinical suspicion of colitis.  Patient reported 5 watery stools per day.  Also blood present. -Prednisone 60 mg started on 03/13/2019. -he has noticed improvement with diarrhea in the last 1 week.  Blood in the stools has also improved. -I will cut back on prednisone to 40 mg daily.  I will reevaluate him in 1 week.   2.  Weight loss: - Since the start of prednisone 60 mg daily on 03/13/2019, he gained 9 pounds.  He is eating 3 meals per day and 3 snacks.  3.  Brain metastasis: -MRI of the brain on 08/22/2018 showed 3 small subcentimeter metastasis in the right insula, right occipital lobe and right cerebellar hemisphere. -WBRT from 09/02/2018 through 09/13/2018. -MRI of the brain on 12/13/2018 showed stable exam with 3 successfully treated brain lesions.

## 2019-03-24 ENCOUNTER — Encounter (HOSPITAL_COMMUNITY): Payer: Self-pay | Admitting: Emergency Medicine

## 2019-03-24 ENCOUNTER — Other Ambulatory Visit: Payer: Self-pay

## 2019-03-24 ENCOUNTER — Emergency Department (HOSPITAL_COMMUNITY)
Admission: EM | Admit: 2019-03-24 | Discharge: 2019-03-24 | Disposition: A | Discharge status: Home / Self Care | Payer: Medicare Other | Attending: Emergency Medicine | Admitting: Emergency Medicine

## 2019-03-24 ENCOUNTER — Ambulatory Visit: Payer: Self-pay | Admitting: Radiation Oncology

## 2019-03-24 DIAGNOSIS — Z87891 Personal history of nicotine dependence: Secondary | ICD-10-CM | POA: Diagnosis not present

## 2019-03-24 DIAGNOSIS — K5900 Constipation, unspecified: Secondary | ICD-10-CM | POA: Insufficient documentation

## 2019-03-24 DIAGNOSIS — Z7984 Long term (current) use of oral hypoglycemic drugs: Secondary | ICD-10-CM | POA: Insufficient documentation

## 2019-03-24 DIAGNOSIS — I10 Essential (primary) hypertension: Secondary | ICD-10-CM | POA: Insufficient documentation

## 2019-03-24 DIAGNOSIS — K649 Unspecified hemorrhoids: Secondary | ICD-10-CM

## 2019-03-24 DIAGNOSIS — Z7982 Long term (current) use of aspirin: Secondary | ICD-10-CM | POA: Diagnosis not present

## 2019-03-24 DIAGNOSIS — E119 Type 2 diabetes mellitus without complications: Secondary | ICD-10-CM | POA: Insufficient documentation

## 2019-03-24 DIAGNOSIS — J449 Chronic obstructive pulmonary disease, unspecified: Secondary | ICD-10-CM | POA: Insufficient documentation

## 2019-03-24 DIAGNOSIS — K625 Hemorrhage of anus and rectum: Secondary | ICD-10-CM | POA: Diagnosis not present

## 2019-03-24 LAB — COMPREHENSIVE METABOLIC PANEL
ALT: 19 U/L (ref 0–44)
AST: 18 U/L (ref 15–41)
Albumin: 3.5 g/dL (ref 3.5–5.0)
Alkaline Phosphatase: 63 U/L (ref 38–126)
Anion gap: 12 (ref 5–15)
BUN: 21 mg/dL (ref 8–23)
CO2: 27 mmol/L (ref 22–32)
Calcium: 9.1 mg/dL (ref 8.9–10.3)
Chloride: 97 mmol/L — ABNORMAL LOW (ref 98–111)
Creatinine, Ser: 0.69 mg/dL (ref 0.61–1.24)
GFR calc Af Amer: >60 mL/min (ref >60)
GFR calc non Af Amer: >60 mL/min (ref >60)
Glucose, Bld: 121 mg/dL — ABNORMAL HIGH (ref 70–99)
Potassium: 4.3 mmol/L (ref 3.5–5.1)
Sodium: 136 mmol/L (ref 135–145)
Total Bilirubin: 0.6 mg/dL (ref 0.3–1.2)
Total Protein: 6 g/dL — ABNORMAL LOW (ref 6.5–8.1)

## 2019-03-24 LAB — CBC
HCT: 40.9 % (ref 39.0–52.0)
Hemoglobin: 13.6 g/dL (ref 13.0–17.0)
MCH: 32.6 pg (ref 26.0–34.0)
MCHC: 33.3 g/dL (ref 30.0–36.0)
MCV: 98.1 fL (ref 80.0–100.0)
Platelets: 313 10*3/uL (ref 150–400)
RBC: 4.17 MIL/uL — ABNORMAL LOW (ref 4.22–5.81)
RDW: 15.6 % — ABNORMAL HIGH (ref 11.5–15.5)
WBC: 15.4 10*3/uL — ABNORMAL HIGH (ref 4.0–10.5)
nRBC: 0 % (ref 0.0–0.2)

## 2019-03-24 LAB — TYPE AND SCREEN
ABO/RH(D): O NEG
Antibody Screen: NEGATIVE

## 2019-03-24 LAB — POC OCCULT BLOOD, ED: Fecal Occult Bld: POSITIVE — AB

## 2019-03-24 NOTE — Discharge Instructions (Signed)
We have begun treatment for constipation with an enema.  You will need to continue treating the constipation by taking Colace, a stool softener, 100 mg, twice a day for 1 month.  You can also use MiraLAX once or twice a day to help your stooling if needed.  Try to increase the amount of fiber in your diet to improve your chances of having normal bowel movements.  There was a small amount of blood which could be from the hemorrhoids or secondary to the constipation.  There is a small chance that you have a colon abnormality including cancer.  You should follow-up with your primary care doctor or gastroenterologist, for a checkup about the rectal bleeding in 2 or 3 weeks.

## 2019-03-24 NOTE — ED Triage Notes (Signed)
Pt states that he is having constipation with rectal bleeding. It is bright red blood. Pt states that he has not had a good bm in a couple of weeks.

## 2019-03-24 NOTE — ED Provider Notes (Signed)
[facility] EMERGENCY DEPARTMENT Provider Note   CSN: [id number] Arrival date & time: 03/24/19  1819    History   Chief Complaint Chief Complaint  Patient presents with  . Rectal Bleeding    HPI [name] is a 73 y.o. male.     HPI   He presents for evaluation of constipation without a bowel movement for 3 days.  Today he passed some blood while straining to have a bowel movement.  He has known external hemorrhoids.  He states that after that he tried to manually disimpact himself without resolution of his discomfort.  He has been eating well up until today.  He has not had any vomiting.  He feels like his abdomen is mildly distended but it is not painful.  He denies fever, chills, nausea, vomiting, weakness or dizziness.  There are no other known modifying factors.  Past Medical History:  Diagnosis Date  . Actinic keratosis   . Arthritis   . Colitis MAY 2012    CT ABD/PELVIS HEP FLEXURE  . COPD (chronic obstructive pulmonary disease) ([place])   . Diabetes mellitus without complication ([place])   . History of kidney stones   . Hyperlipemia   . Hypertension   . NSAID long-term use NAPROXEN FOR OA  . SCL Ca dx'd 08/2018    Patient Active Problem List   Diagnosis Date Noted  . Goals of care, counseling/discussion 09/02/2018  . Brain metastasis ([place]) 08/29/2018  . Small cell carcinoma of lung, right ([place])   . 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 DNR (do not resuscitate) discussion 08/22/2018  . 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 Thoracic ascending aortic aneurysm ([place]) 08/21/2018  . Lymphadenopathy 08/21/2018  . Medial meniscus tear   . Primary osteoarthritis of knee   . Synovial plica of knee   . Colitis 01/25/2011  . Colon cancer screening 01/25/2011    Past Surgical History:  Procedure Laterality Date  . BACK SURGERY     spinal stenosis  . BASAL CELL CARCINOMA EXCISION  FACE, arms feet, leg  . CHOLECYSTECTOMY   JUNE 2011 MJ   STONES, PANCREATITIS  . KNEE ARTHROSCOPY WITH MEDIAL MENISECTOMY Right 03/24/2015   Procedure: KNEE ARTHROSCOPY WITH MEDIAL MENISECTOMY;  Surgeon: Carole Civil, MD;  Location: [facility];  Service: Orthopedics;  Laterality: Right;  . KNEE SURGERY Left LEFT   arthroscopy  . LITHOTRIPSY  80s  . PORTACATH PLACEMENT Left 09/11/2018   Procedure: INSERTION PORT-A-CATH;  Surgeon: Virl Cagey, MD;  Location: [facility] ORS;  Service: General;  Laterality: Left;  . SPINE SURGERY          Home Medications    Prior to Admission medications   Medication Sig Start Date End Date Taking? Authorizing Provider  aspirin 81 MG tablet Take 81 mg by mouth daily.     Yes [provider]  Cholecalciferol 50 MCG (2000 UT) TBDP Take 1 tablet by mouth daily.   Yes [provider]  metFORMIN (GLUCOPHAGE) 500 MG tablet Take 500 mg by mouth 2 (two) times daily with a meal.   Yes [provider]  mirtazapine (REMERON) 15 MG tablet Take 1 tablet (15 mg total) by mouth at bedtime. 02/13/19  Yes Derek Jack, MD  Multiple Vitamin (MULTIVITAMIN) tablet Take 1 tablet by mouth daily.   Yes [provider]  predniSONE (DELTASONE) 20 MG tablet Take 3 tablets (60 mg total) by mouth daily with breakfast. Patient taking differently: Take  40 mg by mouth daily with breakfast.  03/13/19  Yes Derek Jack, MD  rosuvastatin (CRESTOR) 10 MG tablet Take 10 mg by mouth daily.     Yes [provider]  tamsulosin (FLOMAX) 0.4 MG CAPS capsule Take 0.4 mg by mouth daily. 10/31/18  Yes [provider]  verapamil (COVERA HS) 240 MG (CO) 24 hr tablet Take 120 mg by mouth at bedtime.    Yes [provider]  albuterol (PROVENTIL HFA;VENTOLIN HFA) 108 (90 Base) MCG/ACT inhaler Inhale 2 puffs into the lungs every 6 (six) hours as needed for wheezing or shortness of breath.  06/22/18   [provider]  Durvalumab (IMFINZI IV) Inject into the vein every  21 ( twenty-one) days.    [provider]    Family History Family History  Problem Relation Age of Onset  . Cancer Mother        lung  . Cancer Father        lung and liver  . Colon polyps Neg Hx   . Colon cancer Neg Hx     Social History Social History   Tobacco Use  . Smoking status: Former Smoker    Packs/day: 1.00    Years: 50.00    Pack years: 50.00    Types: Cigarettes    Quit date: 08/07/2018    Years since quitting: 0.6  . Smokeless tobacco: Never Used  Substance Use Topics  . Alcohol use: Yes    Comment: once a month  . Drug use: No     Allergies   Patient has no known allergies.   Review of Systems Review of Systems  All other systems reviewed and are negative.    Physical Exam Updated Vital Signs BP 132/84   Pulse 91   Temp 98 F (36.7 C) (Oral)   Resp 18   Ht 5\' 9"  (1.753 m)   Wt 75.8 kg   SpO2 98%   BMI 24.66 kg/m   Physical Exam Vitals signs and nursing note reviewed.  Constitutional:      Appearance: Normal appearance. He is well-developed. He is not ill-appearing.  HENT:     Head: Normocephalic and atraumatic.     Right Ear: External ear normal.     Left Ear: External ear normal.  Eyes:     Conjunctiva/sclera: Conjunctivae normal.     Pupils: Pupils are equal, round, and reactive to light.  Neck:     Musculoskeletal: Normal range of motion and neck supple.     Trachea: Phonation normal.  Cardiovascular:     Rate and Rhythm: Normal rate and regular rhythm.     Heart sounds: Normal heart sounds.  Pulmonary:     Effort: Pulmonary effort is normal.     Breath sounds: Normal breath sounds.  Abdominal:     Palpations: Abdomen is soft.     Tenderness: There is no abdominal tenderness.  Genitourinary:    Comments: Anus with external hemorrhoids mostly left-sided, they are soft, they are not actively bleeding at this time.  On digital rectal examination he has a fecal impaction which is high, and unable to be manually  extracted by me. Musculoskeletal: Normal range of motion.  Skin:    General: Skin is warm and dry.  Neurological:     Mental Status: He is alert and oriented to person, place, and time.     Cranial Nerves: No cranial nerve deficit.     Sensory: No sensory deficit.  Motor: No abnormal muscle tone.     Coordination: Coordination normal.  Psychiatric:        Mood and Affect: Mood normal.        Behavior: Behavior normal.        Thought Content: Thought content normal.        Judgment: Judgment normal.      ED Treatments / Results  Labs (all labs ordered are listed, but only abnormal results are displayed) Labs Reviewed  COMPREHENSIVE METABOLIC PANEL - Abnormal; Notable for the following components:      Result Value   Chloride 97 (*)    Glucose, Bld 121 (*)    Total Protein 6.0 (*)    All other components within normal limits  CBC - Abnormal; Notable for the following components:   WBC 15.4 (*)    RBC 4.17 (*)    RDW 15.6 (*)    All other components within normal limits  POC OCCULT BLOOD, ED - Abnormal; Notable for the following components:   Fecal Occult Bld POSITIVE (*)    All other components within normal limits  TYPE AND SCREEN    EKG None  Radiology No results found.  Procedures Procedures (including critical care time)  Medications Ordered in ED Medications - No data to display   Initial Impression / Assessment and Plan / ED Course  I have reviewed the triage vital signs and the nursing notes.  Pertinent labs & imaging results that were available during my care of the patient were reviewed by me and considered in my medical decision making (see chart for details).         Patient Vitals for the past 24 hrs:  BP Temp Temp src Pulse Resp SpO2 Height Weight  03/24/19 2130 132/84 - - - - - - -  03/24/19 2100 133/82 - - - - - - -  03/24/19 2030 117/82 - - - - - - -  03/24/19 1827 116/72 98 F (36.7 C) Oral 91 18 98 % 5\' 9"  (1.753 m) 75.8 kg     At time of discharge- reevaluation with update and discussion. After initial assessment and treatment, an updated evaluation reveals he has had large bowel movement after receiving soapsuds enema, and is comfortable.  He is ready to go home.Daleen Bo   Medical Decision Making: Constipation, nonspecific without signs for bowel obstruction.  Patient had resolution of symptoms, following treatment with enema in the emergency department.  Incidental rectal bleeding is likely related to constipation and/or hemorrhoids.  Hemoglobin is reassuring.  No indication for GI evaluation acutely.  No indication for hospitalization at this time. CRITICAL CARE-no Performed by: Daleen Bo  Nursing Notes Reviewed/ Care Coordinated Applicable Imaging Reviewed Interpretation of Laboratory Data incorporated into ED treatment  The patient appears reasonably screened and/or stabilized for discharge and I doubt any other medical condition or other [facility] requiring further screening, evaluation, or treatment in the ED at this time prior to discharge.  Plan: Home Medications-usual medicines and take Colace twice daily for 1 month.  Use MiraLAX as needed; Home Treatments-high-fiber diet, drink plenty of fluids; return here if the recommended treatment, does not improve the symptoms; Recommended follow up-PCP 1 week and as needed.   Final Clinical Impressions(s) / ED Diagnoses   Final diagnoses:  Constipation, unspecified constipation type  Rectal bleeding  Hemorrhoids, unspecified hemorrhoid type    ED Discharge Orders    None       Daleen Bo, MD 03/25/19  1243  

## 2019-03-25 ENCOUNTER — Other Ambulatory Visit (HOSPITAL_COMMUNITY): Payer: Medicare Other

## 2019-03-25 ENCOUNTER — Inpatient Hospital Stay (HOSPITAL_BASED_OUTPATIENT_CLINIC_OR_DEPARTMENT_OTHER): Payer: Medicare Other | Admitting: Hematology

## 2019-03-25 ENCOUNTER — Encounter (HOSPITAL_COMMUNITY): Payer: Self-pay | Admitting: Hematology

## 2019-03-25 DIAGNOSIS — C3491 Malignant neoplasm of unspecified part of right bronchus or lung: Secondary | ICD-10-CM | POA: Diagnosis not present

## 2019-03-25 NOTE — Assessment & Plan Note (Signed)
1.  Extensive stage small cell lung cancer: -4 cycles of carboplatin, VP-16 and durvalumab from 08/23/2018 through 10/28/2018. -CT CAP on 01/14/2019 showed continued further decrease in mediastinal, hilar, axillary adenopathy.  New tiny pericardial effusion and small right pleural effusion. -Last durvalumab on 02/13/2019. -Durvalumab held on 03/13/2019 for clinical suspicion of colitis.  2.  Colitis: -he experienced diarrhea with 5 watery stools per day and blood in it. -he was started on prednisone 60 mg daily on 03/13/2019.  1 week after starting steroids, his diarrhea has improved and he has gained 9 pounds.  I decrease prednisone to 40 mg on 03/20/2019. - He was constipated and went to the ER last night and had an anemia.  He continues to feel well. -I will further cut back on prednisone to 20 mg daily today.  I will reevaluate him in 1 week.  3.  Brain metastasis: - MRI of the brain on 08/22/2018 showed 3 small subcentimeter metastasis in the right insula, right occipital lobe and right cerebellar hemisphere. -WB RT from 09/02/2018 through 09/13/2018. -MRI of the brain on 12/13/2018 showed stable exam with 3 successfully treated brain lesions.

## 2019-03-25 NOTE — Progress Notes (Signed)
[place], [place] [id number]   CLINIC:  Medical Oncology/Hematology  PCP:  Lucia Gaskins, [place] [address] [phone]   REASON FOR VISIT:  Follow-up for extensive stage small cell lung cancer with brain metastasis   CURRENT THERAPY:Durvalumab every 4 weeks.   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[place])  08/22/2018 Initial Diagnosis   Small cell carcinoma of lung, right ([place])   08/23/2018 - 11/17/2018 Chemotherapy   The patient had palonosetron (ALOXI) injection 0.25 mg, 0.25 mg, Intravenous,  Once, 4 of 4 cycles Administration: 0.25 mg (08/23/2018), 0.25 mg (09/16/2018), 0.25 mg (10/07/2018), 0.25 mg (10/28/2018) pegfilgrastim-cbqv (UDENYCA) injection 6 mg, 6 mg, Subcutaneous, Once, 4 of 4 cycles Administration: 6 mg (08/27/2018), 6 mg (09/20/2018), 6 mg (10/11/2018), 6 mg (11/01/2018) CARBOplatin (PARAPLATIN) 560 mg in sodium chloride 0.9 % 250 mL chemo infusion, 560 mg (100 % of original dose 564 mg), Intravenous,  Once, 4 of 4 cycles Dose modification:   (original dose 564 mg, Cycle 1),   (original dose 523.5 mg, Cycle 3),   (original dose 523.5 mg, Cycle 4) Administration: 560 mg (08/23/2018), 560 mg (09/16/2018), 520 mg (10/07/2018), 510 mg (10/28/2018) etoposide (VEPESID) 220 mg in sodium chloride 0.9 % 1,000 mL chemo infusion, 100 mg/m2 = 220 mg, Intravenous,  Once, 4 of 4 cycles Administration: 220 mg (08/23/2018), 200 mg (09/16/2018), 200 mg (09/17/2018), 200 mg (09/18/2018), 200 mg (10/07/2018), 200 mg (10/08/2018), 200 mg (10/09/2018), 200 mg (10/28/2018), 200 mg (10/29/2018), 200 mg (10/30/2018) fosaprepitant (EMEND) 150 mg, dexamethasone (DECADRON) 12 mg in sodium chloride 0.9 % 145 mL IVPB, , Intravenous,  Once, 4 of 4 cycles Administration:  (08/23/2018),  (09/16/2018),  (10/07/2018),  (10/28/2018) durvalumab (IMFINZI) 1,500 mg in sodium chloride 0.9  % 100 mL chemo infusion, 1,500 mg (100 % of original dose 1,500 mg), Intravenous,  Once, 3 of 3 cycles Dose modification: 1,500 mg (original dose 1,500 mg, Cycle 2, Reason: Provider Judgment) Administration: 1,500 mg (09/16/2018), 1,500 mg (10/07/2018), 1,500 mg (10/28/2018)  for chemotherapy treatment.    Small cell carcinoma of lung, right ([place])  08/23/2018 - 11/17/2018 Chemotherapy   The patient had palonosetron (ALOXI) injection 0.25 mg, 0.25 mg, Intravenous,  Once, 4 of 4 cycles Administration: 0.25 mg (08/23/2018), 0.25 mg (09/16/2018), 0.25 mg (10/07/2018), 0.25 mg (10/28/2018) pegfilgrastim-cbqv (UDENYCA) injection 6 mg, 6 mg, Subcutaneous, Once, 4 of 4 cycles Administration: 6 mg (08/27/2018), 6 mg (09/20/2018), 6 mg (10/11/2018), 6 mg (11/01/2018) CARBOplatin (PARAPLATIN) 560 mg in sodium chloride 0.9 % 250 mL chemo infusion, 560 mg (100 % of original dose 564 mg), Intravenous,  Once, 4 of 4 cycles Dose modification:   (original dose 564 mg, Cycle 1),   (original dose 523.5 mg, Cycle 3),   (original dose 523.5 mg, Cycle 4) Administration: 560 mg (08/23/2018), 560 mg (09/16/2018), 520 mg (10/07/2018), 510 mg (10/28/2018) etoposide (VEPESID) 220 mg in sodium chloride 0.9 % 1,000 mL chemo infusion, 100 mg/m2 = 220 mg, Intravenous,  Once, 4 of 4 cycles Administration: 220 mg (08/23/2018), 200 mg (09/16/2018), 200 mg (09/17/2018), 200 mg (09/18/2018), 200 mg (10/07/2018), 200 mg (10/08/2018), 200 mg (10/09/2018), 200 mg (10/28/2018), 200 mg (10/29/2018), 200 mg (10/30/2018) fosaprepitant (EMEND) 150 mg, dexamethasone (DECADRON) 12 mg in sodium chloride 0.9 % 145 mL IVPB, , Intravenous,  Once, 4 of 4 cycles Administration:  (08/23/2018),  (09/16/2018),  (10/07/2018),  (10/28/2018) durvalumab (IMFINZI) 1,500 mg  in sodium chloride 0.9 % 100 mL chemo infusion, 1,500 mg (100 % of original dose 1,500 mg), Intravenous,  Once, 3 of 3 cycles Dose modification: 1,500 mg (original dose 1,500 mg, Cycle 2, Reason: Provider Judgment)  Administration: 1,500 mg (09/16/2018), 1,500 mg (10/07/2018), 1,500 mg (10/28/2018)  for chemotherapy treatment.    08/26/2018 Initial Diagnosis   Small cell carcinoma of lung, right ([place])   11/20/2018 -  Chemotherapy   The patient had durvalumab (IMFINZI) 1,500 mg in sodium chloride 0.9 % 100 mL chemo infusion, 1,500 mg (100 % of original dose 1,500 mg), Intravenous,  Once, 4 of 9 cycles Dose modification: 1,500 mg (original dose 1,500 mg, Cycle 1, Reason: Other (see comments)) Administration: 1,500 mg (11/20/2018), 1,500 mg (12/19/2018), 1,500 mg (01/16/2019), 1,500 mg (02/13/2019)  for chemotherapy treatment.       CANCER STAGING: Cancer Staging No matching staging information was found for the patient.   INTERVAL HISTORY:  Mr. Blount 73 y.o. male seen for follow-up of immune mediated colitis.  He also has extensive stage small cell lung cancer.  Last durvalumab was on 02/13/2019.  Currently taking prednisone 40 mg daily.  Denies any dyspepsia.  He is continuing to report improved energy levels.  Appetite is also improved.  He had to go to the ER yesterday because of constipation.  He received anemia.  Denies any fevers or night sweats.  Diarrhea has completely stopped.  No bleeding per rectum.   REVIEW OF SYSTEMS:  Review of Systems  Gastrointestinal: Positive for constipation.  All other systems reviewed and are negative.    PAST MEDICAL/SURGICAL HISTORY:  Past Medical History:  Diagnosis Date  . Actinic keratosis   . Arthritis   . Colitis MAY 2012    CT ABD/PELVIS HEP FLEXURE  . COPD (chronic obstructive pulmonary disease) ([place])   . Diabetes mellitus without complication ([place])   . History of kidney stones   . Hyperlipemia   . Hypertension   . NSAID long-term use NAPROXEN FOR OA  . SCL Ca dx'd 08/2018   Past Surgical History:  Procedure Laterality Date  . BACK SURGERY     spinal stenosis  . BASAL CELL CARCINOMA EXCISION  FACE, arms feet, leg  . CHOLECYSTECTOMY  JUNE 2011 MJ    STONES, PANCREATITIS  . KNEE ARTHROSCOPY WITH MEDIAL MENISECTOMY Right 03/24/2015   Procedure: KNEE ARTHROSCOPY WITH MEDIAL MENISECTOMY;  Surgeon: Carole Civil, MD;  Location: [facility];  Service: Orthopedics;  Laterality: Right;  . KNEE SURGERY Left LEFT   arthroscopy  . LITHOTRIPSY  80s  . PORTACATH PLACEMENT Left 09/11/2018   Procedure: INSERTION PORT-A-CATH;  Surgeon: Virl Cagey, MD;  Location: [facility];  Service: General;  Laterality: Left;  . SPINE SURGERY       SOCIAL HISTORY:  Social History   Socioeconomic History  . Marital status: Divorced    Spouse name: Not on file  . Number of children: 0  . Years of education: Not on file  . Highest education level: Not on file  Occupational History  . Occupation: [place] [employer]   . Occupation: Manufacturing engineer   Social Needs  . Financial resource strain: Not hard at all  . Food insecurity    Worry: Never true    Inability: Never true  . Transportation needs    Medical: No    Non-medical: No  Tobacco Use  . Smoking status: Former Smoker    Packs/day: 1.00    Years: 50.00  Pack years: 50.00    Types: Cigarettes    Quit date: 08/07/2018    Years since quitting: 0.6  . Smokeless tobacco: Never Used  Substance and Sexual Activity  . Alcohol use: Yes    Comment: once a month  . Drug use: No  . Sexual activity: Never    Birth control/protection: None  Lifestyle  . Physical activity    Days per week: 0 days    Minutes per session: 0 min  . Stress: Not at all  Relationships  . Social Herbalist on phone: Never    Gets together: More than three times a week    Attends religious service: Never    Active member of club or organization: Yes    Attends meetings of clubs or organizations: More than 4 times per year    Relationship status: Divorced  . Intimate partner violence    Fear of current or ex partner: No    Emotionally abused: No    Physically abused: No    Forced sexual activity: No  Other  Topics Concern  . 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    FAMILY HISTORY:  Family History  Problem Relation Age of Onset  . Cancer Mother        lung  . Cancer Father        lung and liver  . Colon polyps Neg Hx   . Colon cancer Neg Hx     CURRENT MEDICATIONS:  Outpatient Encounter Medications as of 03/25/2019  Medication Sig Note  . albuterol (PROVENTIL HFA;VENTOLIN HFA) 108 (90 Base) MCG/ACT inhaler Inhale 2 puffs into the lungs every 6 (six) hours as needed for wheezing or shortness of breath.    Marland Kitchen aspirin 81 MG tablet Take 81 mg by mouth daily.     . Cholecalciferol 50 MCG (2000 UT) TBDP Take 1 tablet by mouth daily.   Marland Kitchen docusate sodium (COLACE) 100 MG capsule Take 100 mg by mouth 2 (two) times daily.   [place] (IMFINZI IV) Inject into the vein every 21 ( twenty-one) days. 03/24/2019: Patient states that he was scheduled for injection last week but it was cancelled. Patient is due for injection for 03/2019  . metFORMIN (GLUCOPHAGE) 500 MG tablet Take 500 mg by mouth 2 (two) times daily with a meal.   . mirtazapine (REMERON) 15 MG tablet Take 1 tablet (15 mg total) by mouth at bedtime.   . Multiple Vitamin (MULTIVITAMIN) tablet Take 1 tablet by mouth daily.   . predniSONE (DELTASONE) 20 MG tablet Take 3 tablets (60 mg total) by mouth daily with breakfast. (Patient taking differently: Take 40 mg by mouth daily with breakfast. ) 03/24/2019: 2 tabs remaining in course  . rosuvastatin (CRESTOR) 10 MG tablet Take 10 mg by mouth daily.     . tamsulosin (FLOMAX) 0.4 MG CAPS capsule Take 0.4 mg by mouth daily.   . verapamil (COVERA HS) 240 MG (CO) 24 hr tablet Take 120 mg by mouth at bedtime.     No facility-administered encounter medications on file as of 03/25/2019.     ALLERGIES:  No Known Allergies   PHYSICAL EXAM:  ECOG Performance status: 1  Vitals:   03/25/19 1436   BP: 125/80  Pulse: 91  Resp: 18  Temp: 99.6 F (37.6 C)  SpO2: 96%   Filed  Weights   03/25/19 1436  Weight: 167 lb (75.8 kg)    Physical Exam Vitals signs reviewed.  Constitutional:      Appearance: Normal appearance.  Cardiovascular:     Rate and Rhythm: Normal rate and regular rhythm.     Heart sounds: Normal heart sounds.  Pulmonary:     Effort: Pulmonary effort is normal.     Breath sounds: Normal breath sounds.  Abdominal:     General: There is no distension.     Palpations: Abdomen is soft. There is no mass.  Musculoskeletal:        General: No swelling.  Lymphadenopathy:     Cervical: No cervical adenopathy.  Skin:    General: Skin is warm.  Neurological:     General: No focal deficit present.     Mental Status: He is alert and oriented to person, place, and time.  Psychiatric:        Mood and Affect: Mood normal.        Behavior: Behavior normal.      LABORATORY DATA:  I have reviewed the labs as listed.  CBC    Component Value Date/Time   WBC 15.4 (H) 03/24/2019 1928   RBC 4.17 (L) 03/24/2019 1928   HGB 13.6 03/24/2019 1928   HCT 40.9 03/24/2019 1928   PLT 313 03/24/2019 1928   MCV 98.1 03/24/2019 1928   MCH 32.6 03/24/2019 1928   MCHC 33.3 03/24/2019 1928   RDW 15.6 (H) 03/24/2019 1928   LYMPHSABS 1.2 03/19/2019 0927   MONOABS 1.0 03/19/2019 0927   EOSABS 0.1 03/19/2019 0927   BASOSABS 0.0 03/19/2019 0927   CMP Latest Ref Rng & Units 03/24/2019 03/19/2019 03/13/2019  Glucose 70 - 99 mg/dL 121(H) 109(H) 112(H)  BUN 8 - 23 mg/dL 21 15 6(L)  Creatinine 0.61 - 1.24 mg/dL 0.69 0.62 0.63  Sodium 135 - 145 mmol/L 136 139 139  Potassium 3.5 - 5.1 mmol/L 4.3 4.5 4.4  Chloride 98 - 111 mmol/L 97(L) 99 104  CO2 22 - 32 mmol/L 27 30 27   Calcium 8.9 - 10.3 mg/dL 9.1 9.5 9.2  Total Protein 6.5 - 8.1 g/dL 6.0(L) 6.2(L) 6.5  Total Bilirubin 0.3 - 1.2 mg/dL 0.6 0.6 0.4  Alkaline Phos 38 - 126 U/L 63 76 124  AST 15 - 41 U/L 18 20 17   ALT 0 - 44 U/L 19 24  11        DIAGNOSTIC IMAGING:  I have independently reviewed the scans and discussed with the patient.      ASSESSMENT & PLAN:   Small cell carcinoma of lung, right ([place]) 1.  Extensive stage small cell lung cancer: -4 cycles of carboplatin, VP-16 and durvalumab from 08/23/2018 through 10/28/2018. -CT CAP on 01/14/2019 showed continued further decrease in mediastinal, hilar, axillary adenopathy.  New tiny pericardial effusion and small right pleural effusion. -Last durvalumab on 02/13/2019. -Durvalumab held on 03/13/2019 for clinical suspicion of colitis.  2.  Colitis: -he experienced diarrhea with 5 watery stools per day and blood in it. -he was started on prednisone 60 mg daily on 03/13/2019.  1 week after starting steroids, his diarrhea has improved and he has gained 9 pounds.  I decrease prednisone to 40 mg on 03/20/2019. - He was constipated and went to the ER last night and had an anemia.  He continues to feel well. -I will further cut back on prednisone to 20 mg daily today.  I will reevaluate him in 1 week.  3.  Brain metastasis: - MRI of the brain on 08/22/2018 showed 3 small subcentimeter metastasis in the right insula, right occipital lobe and right cerebellar hemisphere. -WB RT from 09/02/2018 through 09/13/2018. -MRI of the brain on 12/13/2018 showed stable exam with 3 successfully treated brain lesions.  Total time spent is 25 minutes with more than 50% of the time spent face-to-face discussing maintenance immunotherapy, side effects and coordination of care.    Orders placed this encounter:  No orders of the defined types were placed in this encounter.     Derek Jack, MD [place] [phone]

## 2019-03-25 NOTE — Patient Instructions (Addendum)
[place] at [facility] Discharge Instructions  You were seen today by Dr. Delton Coombes. He went over your recent lab results. He will see you back in 1 week for labs and follow up.  Start taking only 1 tablet of prednisone a day.  Thank you for choosing [place] at [facility] to provide your oncology and hematology care.  To afford each patient quality time with our provider, please arrive at least 15 minutes before your scheduled appointment time.   If you have a lab appointment with the [facility] please come in thru the  Main Entrance and check in at the main information desk  You need to re-schedule your appointment should you arrive 10 or more minutes late.  We strive to give you quality time with our providers, and arriving late affects you and other patients whose appointments are after yours.  Also, if you no show three or more times for appointments you may be dismissed from the clinic at the providers discretion.     Again, thank you for choosing [facility].  Our hope is that these requests will decrease the amount of time that you wait before being seen by our physicians.       _____________________________________________________________  Should you have questions after your visit to [facility], please contact our office at [phone] between the hours of 8:00 a.m. and 4:30 p.m.  Voicemails left after 4:00 p.m. will not be returned until the following business day.  For prescription refill requests, have your pharmacy contact our office and allow 72 hours.    Cancer Center Support Programs:   > Cancer Support Group  2nd Tuesday of the month 1pm-2pm, Journey Room

## 2019-03-30 NOTE — Progress Notes (Signed)
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 Chest / 9 Gy in 3 fractions 2. Chest Target 1 / 25 Gy in 10 fractionTotal dose 34 Gy 3. Whole Brain PCI / 30 Gy in 10 fractions   Narrative:  The patient was contacted today for routine follow-up. In summary he was seen while hospitalized with symptoms of SVC syndrome and was diagnosed with extensive stage disease. He received palliative radiotherapy to the chest along with whole brain radiotherapy due to three metastatic lesions identified during his work up. He recently had an MRI of the brain on 03/20/2019 that revealed continued improvement of the three treated lesions and no new disease was present. He was contacted by phone without returned calls and came physically to clinic today.   On review of systems, the patient reports that he is doing well overall. He denies any chest pain, shortness of breath, cough, fevers, chills, night sweats, unintended weight changes. He denies any headaches or visual disturbances, changes in speech or movement. He denies any bowel or bladder disturbances, and denies abdominal pain, nausea or vomiting. He denies any new musculoskeletal or joint aches or pains, new skin lesions or concerns. A complete review of systems is obtained and is otherwise negative.  Past Medical History:  Past Medical History:  Diagnosis Date  . Actinic keratosis   . Arthritis   . Colitis MAY 2012    CT ABD/PELVIS HEP FLEXURE  . COPD (chronic obstructive pulmonary disease) ([place])   . Diabetes mellitus without complication ()   . History of kidney stones   . Hyperlipemia   . Hypertension   . NSAID long-term use NAPROXEN FOR OA  . SCL Ca dx'd 08/2018    Past  Surgical History: Past Surgical History:  Procedure Laterality Date  . BACK SURGERY     spinal stenosis  . BASAL CELL CARCINOMA EXCISION  FACE, arms feet, leg  . CHOLECYSTECTOMY  JUNE 2011 MJ   STONES, PANCREATITIS  . KNEE ARTHROSCOPY WITH MEDIAL MENISECTOMY Right 03/24/2015   Procedure: KNEE ARTHROSCOPY WITH MEDIAL MENISECTOMY;  Surgeon: Carole Civil, MD;  Location: [facility];  Service: Orthopedics;  Laterality: Right;  . KNEE SURGERY Left LEFT   arthroscopy  . LITHOTRIPSY  80s  . PORTACATH PLACEMENT Left 09/11/2018   Procedure: INSERTION PORT-A-CATH;  Surgeon: Virl Cagey, MD;  Location: [facility];  Service: General;  Laterality: Left;  . SPINE SURGERY      Social History:  Social History   Socioeconomic History  . Marital status: Divorced    Spouse name: Not on file  . Number of children: 0  . Years of education: Not on file  . Highest education level: Not on file  Occupational History  . Occupation: [place] [employer]   . Occupation: Manufacturing engineer   Social Needs  . Financial resource strain: Not hard at all  . Food insecurity    Worry: Never true    Inability: Never true  . Transportation needs    Medical: No    Non-medical: No  Tobacco Use  . Smoking status: Former Smoker    Packs/day: 1.00    Years: 50.00    Pack years: 50.00  Types: Cigarettes    Quit date: 08/07/2018    Years since quitting: 0.6  . Smokeless tobacco: Never Used  Substance and Sexual Activity  . Alcohol use: Yes    Comment: once a month  . Drug use: No  . Sexual activity: Never    Birth control/protection: None  Lifestyle  . Physical activity    Days per week: 0 days    Minutes per session: 0 min  . Stress: Not at all  Relationships  . Social Herbalist on phone: Never    Gets together: More than three times a week    Attends religious service: Never    Active member of club or organization: Yes    Attends meetings of clubs or organizations: More than 4 times per year     Relationship status: Divorced  . Intimate partner violence    Fear of current or ex partner: No    Emotionally abused: No    Physically abused: No    Forced sexual activity: No  Other Topics Concern  . 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    Family History: Family History  Problem Relation Age of Onset  . Cancer Mother        lung  . Cancer Father        lung and liver  . Colon polyps Neg Hx   . Colon cancer Neg Hx    Medications: Current Outpatient Medications  Medication Sig Dispense Refill  . albuterol (PROVENTIL HFA;VENTOLIN HFA) 108 (90 Base) MCG/ACT inhaler Inhale 2 puffs into the lungs every 6 (six) hours as needed for wheezing or shortness of breath.     Marland Kitchen aspirin 81 MG tablet Take 81 mg by mouth daily.      . Cholecalciferol 50 MCG (2000 UT) TBDP Take 1 tablet by mouth daily.    Marland Kitchen docusate sodium (COLACE) 100 MG capsule Take 100 mg by mouth 2 (two) times daily.    Hunt Oris (IMFINZI IV) Inject into the vein every 21 ( twenty-one) days.    . metFORMIN (GLUCOPHAGE) 500 MG tablet Take 500 mg by mouth 2 (two) times daily with a meal.    . mirtazapine (REMERON) 15 MG tablet Take 1 tablet (15 mg total) by mouth at bedtime. 30 tablet 1  . Multiple Vitamin (MULTIVITAMIN) tablet Take 1 tablet by mouth daily.    . predniSONE (DELTASONE) 20 MG tablet Take 3 tablets (60 mg total) by mouth daily with breakfast. (Patient taking differently: Take 40 mg by mouth daily with breakfast. ) 60 tablet 0  . rosuvastatin (CRESTOR) 10 MG tablet Take 10 mg by mouth daily.      . tamsulosin (FLOMAX) 0.4 MG CAPS capsule Take 0.4 mg by mouth daily.    . verapamil (COVERA HS) 240 MG (CO) 24 hr tablet Take 120 mg by mouth at bedtime.      No current facility-administered medications for this visit.     Allergies:  No Known Allergies  Physical Exam:  In general this is a well  appearing caucasian male in no acute distress. He's alert and oriented x4 and appropriate throughout the examination. Cardiopulmonary assessment is negative for acute distress and he exhibits normal effort.    ECOG Status: 0  Impression/Plan: 1. 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 The patient appears to be  doing well overall since he was last seen. He continues with Dr. Delton Coombes in [place] and has been on immunotherapy, however his last cycle was held due to concerns for colitis. He is feeling better and will likely resume in the next few weeks. We will plan for repeat MRI of the brain in 3 months time. He is in agreement with this plan.   This encounter was provided by telemedicine platform Webex.  The patient has given verbal consent for this type of encounter and has been advised to only accept a meeting of this type in a secure network environment. The time spent during this encounter was 1- minutes. The attendants for this meeting included Hayden Pedro  and Asencion Islam.  During the encounter, Hayden Pedro was working remotely while the patient presented to clinic and was in the radiation oncology clinic space.     Carola Rhine, PAC

## 2019-03-31 ENCOUNTER — Ambulatory Visit
Admission: RE | Admit: 2019-03-31 | Discharge: 2019-03-31 | Disposition: A | Discharge status: Home / Self Care | Payer: Medicare Other | Source: Ambulatory Visit | Attending: Radiation Oncology | Admitting: Radiation Oncology

## 2019-03-31 ENCOUNTER — Other Ambulatory Visit: Payer: Self-pay

## 2019-03-31 ENCOUNTER — Encounter: Payer: Self-pay | Admitting: Radiation Oncology

## 2019-03-31 VITALS — BP 128/78 | HR 79 | Temp 99.2°F | Resp 20 | Wt 169.2 lb

## 2019-03-31 DIAGNOSIS — C7931 Secondary malignant neoplasm of brain: Secondary | ICD-10-CM

## 2019-03-31 DIAGNOSIS — C3491 Malignant neoplasm of unspecified part of right bronchus or lung: Secondary | ICD-10-CM

## 2019-04-01 ENCOUNTER — Inpatient Hospital Stay (HOSPITAL_BASED_OUTPATIENT_CLINIC_OR_DEPARTMENT_OTHER): Payer: Medicare Other | Admitting: Hematology

## 2019-04-01 ENCOUNTER — Encounter (HOSPITAL_COMMUNITY): Payer: Self-pay | Admitting: Hematology

## 2019-04-01 ENCOUNTER — Inpatient Hospital Stay (HOSPITAL_COMMUNITY): Payer: Medicare Other

## 2019-04-01 DIAGNOSIS — C3491 Malignant neoplasm of unspecified part of right bronchus or lung: Secondary | ICD-10-CM

## 2019-04-01 LAB — CBC WITH DIFFERENTIAL/PLATELET
Abs Immature Granulocytes: 0.04 10*3/uL (ref 0.00–0.07)
Basophils Absolute: 0 10*3/uL (ref 0.0–0.1)
Basophils Relative: 0 %
Eosinophils Absolute: 0 10*3/uL (ref 0.0–0.5)
Eosinophils Relative: 0 %
HCT: 36.8 % — ABNORMAL LOW (ref 39.0–52.0)
Hemoglobin: 12.1 g/dL — ABNORMAL LOW (ref 13.0–17.0)
Immature Granulocytes: 0 %
Lymphocytes Relative: 6 %
Lymphs Abs: 0.6 10*3/uL — ABNORMAL LOW (ref 0.7–4.0)
MCH: 32.8 pg (ref 26.0–34.0)
MCHC: 32.9 g/dL (ref 30.0–36.0)
MCV: 99.7 fL (ref 80.0–100.0)
Monocytes Absolute: 0.2 10*3/uL (ref 0.1–1.0)
Monocytes Relative: 2 %
Neutro Abs: 9.2 10*3/uL — ABNORMAL HIGH (ref 1.7–7.7)
Neutrophils Relative %: 92 %
Platelets: 189 10*3/uL (ref 150–400)
RBC: 3.69 MIL/uL — ABNORMAL LOW (ref 4.22–5.81)
RDW: 16.1 % — ABNORMAL HIGH (ref 11.5–15.5)
WBC: 10.1 10*3/uL (ref 4.0–10.5)
nRBC: 0 % (ref 0.0–0.2)

## 2019-04-01 LAB — COMPREHENSIVE METABOLIC PANEL
ALT: 16 U/L (ref 0–44)
AST: 18 U/L (ref 15–41)
Albumin: 3.5 g/dL (ref 3.5–5.0)
Alkaline Phosphatase: 62 U/L (ref 38–126)
Anion gap: 9 (ref 5–15)
BUN: 19 mg/dL (ref 8–23)
CO2: 27 mmol/L (ref 22–32)
Calcium: 9.3 mg/dL (ref 8.9–10.3)
Chloride: 101 mmol/L (ref 98–111)
Creatinine, Ser: 0.71 mg/dL (ref 0.61–1.24)
GFR calc Af Amer: >60 mL/min (ref >60)
GFR calc non Af Amer: >60 mL/min (ref >60)
Glucose, Bld: 171 mg/dL — ABNORMAL HIGH (ref 70–99)
Potassium: 4.5 mmol/L (ref 3.5–5.1)
Sodium: 137 mmol/L (ref 135–145)
Total Bilirubin: 0.6 mg/dL (ref 0.3–1.2)
Total Protein: 6.1 g/dL — ABNORMAL LOW (ref 6.5–8.1)

## 2019-04-01 LAB — MAGNESIUM: Magnesium: 2 mg/dL (ref 1.7–2.4)

## 2019-04-01 NOTE — Patient Instructions (Signed)
[place] at [facility] Discharge Instructions  You were seen today by Dr. Delton Coombes. He went over your recent test results. He will see you back in 1 week for labs and follow up.  Please start taking 1/2 tablet of prednisone daily.  Thank you for choosing [place] at [facility] to provide your oncology and hematology care.  To afford each patient quality time with our provider, please arrive at least 15 minutes before your scheduled appointment time.   If you have a lab appointment with the [facility] please come in thru the  Main Entrance and check in at the main information desk  You need to re-schedule your appointment should you arrive 10 or more minutes late.  We strive to give you quality time with our providers, and arriving late affects you and other patients whose appointments are after yours.  Also, if you no show three or more times for appointments you may be dismissed from the clinic at the providers discretion.     Again, thank you for choosing [facility].  Our hope is that these requests will decrease the amount of time that you wait before being seen by our physicians.       _____________________________________________________________  Should you have questions after your visit to [facility], please contact our office at [phone] between the hours of 8:00 a.m. and 4:30 p.m.  Voicemails left after 4:00 p.m. will not be returned until the following business day.  For prescription refill requests, have your pharmacy contact our office and allow 72 hours.    Cancer Center Support Programs:   > Cancer Support Group  2nd Tuesday of the month 1pm-2pm, Journey Room

## 2019-04-01 NOTE — Progress Notes (Signed)
[place], [place] [address]   CLINIC:  Medical Oncology/Hematology  PCP:  Lucia Gaskins, [place] [address] [phone]   REASON FOR VISIT:  Follow-up for extensive stage small cell lung cancer with brain metastasis   CURRENT THERAPY:Durvalumab every 4 weeks.   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[place])  08/22/2018 Initial Diagnosis   Small cell carcinoma of lung, right ([place])   08/23/2018 - 11/17/2018 Chemotherapy   The patient had palonosetron (ALOXI) injection 0.25 mg, 0.25 mg, Intravenous,  Once, 4 of 4 cycles Administration: 0.25 mg (08/23/2018), 0.25 mg (09/16/2018), 0.25 mg (10/07/2018), 0.25 mg (10/28/2018) pegfilgrastim-cbqv (UDENYCA) injection 6 mg, 6 mg, Subcutaneous, Once, 4 of 4 cycles Administration: 6 mg (08/27/2018), 6 mg (09/20/2018), 6 mg (10/11/2018), 6 mg (11/01/2018) CARBOplatin (PARAPLATIN) 560 mg in sodium chloride 0.9 % 250 mL chemo infusion, 560 mg (100 % of original dose 564 mg), Intravenous,  Once, 4 of 4 cycles Dose modification:   (original dose 564 mg, Cycle 1),   (original dose 523.5 mg, Cycle 3),   (original dose 523.5 mg, Cycle 4) Administration: 560 mg (08/23/2018), 560 mg (09/16/2018), 520 mg (10/07/2018), 510 mg (10/28/2018) etoposide (VEPESID) 220 mg in sodium chloride 0.9 % 1,000 mL chemo infusion, 100 mg/m2 = 220 mg, Intravenous,  Once, 4 of 4 cycles Administration: 220 mg (08/23/2018), 200 mg (09/16/2018), 200 mg (09/17/2018), 200 mg (09/18/2018), 200 mg (10/07/2018), 200 mg (10/08/2018), 200 mg (10/09/2018), 200 mg (10/28/2018), 200 mg (10/29/2018), 200 mg (10/30/2018) fosaprepitant (EMEND) 150 mg, dexamethasone (DECADRON) 12 mg in sodium chloride 0.9 % 145 mL IVPB, , Intravenous,  Once, 4 of 4 cycles Administration:  (08/23/2018),  (09/16/2018),  (10/07/2018),  (10/28/2018) durvalumab (IMFINZI) 1,500 mg in sodium chloride 0.9  % 100 mL chemo infusion, 1,500 mg (100 % of original dose 1,500 mg), Intravenous,  Once, 3 of 3 cycles Dose modification: 1,500 mg (original dose 1,500 mg, Cycle 2, Reason: Provider Judgment) Administration: 1,500 mg (09/16/2018), 1,500 mg (10/07/2018), 1,500 mg (10/28/2018)  for chemotherapy treatment.    Small cell carcinoma of lung, right ([place])  08/23/2018 - 11/17/2018 Chemotherapy   The patient had palonosetron (ALOXI) injection 0.25 mg, 0.25 mg, Intravenous,  Once, 4 of 4 cycles Administration: 0.25 mg (08/23/2018), 0.25 mg (09/16/2018), 0.25 mg (10/07/2018), 0.25 mg (10/28/2018) pegfilgrastim-cbqv (UDENYCA) injection 6 mg, 6 mg, Subcutaneous, Once, 4 of 4 cycles Administration: 6 mg (08/27/2018), 6 mg (09/20/2018), 6 mg (10/11/2018), 6 mg (11/01/2018) CARBOplatin (PARAPLATIN) 560 mg in sodium chloride 0.9 % 250 mL chemo infusion, 560 mg (100 % of original dose 564 mg), Intravenous,  Once, 4 of 4 cycles Dose modification:   (original dose 564 mg, Cycle 1),   (original dose 523.5 mg, Cycle 3),   (original dose 523.5 mg, Cycle 4) Administration: 560 mg (08/23/2018), 560 mg (09/16/2018), 520 mg (10/07/2018), 510 mg (10/28/2018) etoposide (VEPESID) 220 mg in sodium chloride 0.9 % 1,000 mL chemo infusion, 100 mg/m2 = 220 mg, Intravenous,  Once, 4 of 4 cycles Administration: 220 mg (08/23/2018), 200 mg (09/16/2018), 200 mg (09/17/2018), 200 mg (09/18/2018), 200 mg (10/07/2018), 200 mg (10/08/2018), 200 mg (10/09/2018), 200 mg (10/28/2018), 200 mg (10/29/2018), 200 mg (10/30/2018) fosaprepitant (EMEND) 150 mg, dexamethasone (DECADRON) 12 mg in sodium chloride 0.9 % 145 mL IVPB, , Intravenous,  Once, 4 of 4 cycles Administration:  (08/23/2018),  (09/16/2018),  (10/07/2018),  (10/28/2018) durvalumab (IMFINZI) 1,500 mg  in sodium chloride 0.9 % 100 mL chemo infusion, 1,500 mg (100 % of original dose 1,500 mg), Intravenous,  Once, 3 of 3 cycles Dose modification: 1,500 mg (original dose 1,500 mg, Cycle 2, Reason: Provider Judgment)  Administration: 1,500 mg (09/16/2018), 1,500 mg (10/07/2018), 1,500 mg (10/28/2018)  for chemotherapy treatment.    08/26/2018 Initial Diagnosis   Small cell carcinoma of lung, right ([place])   11/20/2018 -  Chemotherapy   The patient had durvalumab (IMFINZI) 1,500 mg in sodium chloride 0.9 % 100 mL chemo infusion, 1,500 mg (100 % of original dose 1,500 mg), Intravenous,  Once, 4 of 9 cycles Dose modification: 1,500 mg (original dose 1,500 mg, Cycle 1, Reason: Other (see comments)) Administration: 1,500 mg (11/20/2018), 1,500 mg (12/19/2018), 1,500 mg (01/16/2019), 1,500 mg (02/13/2019)  for chemotherapy treatment.       CANCER STAGING: Cancer Staging No matching staging information was found for the patient.   INTERVAL HISTORY:  [name] 73 y.o. male seen for follow-up of immune mediated colitis.  He is currently taking prednisone 20 mg daily.  Last development was on 02/13/2019.  Patient does not report any diarrhea or bleeding per rectum at this time.  He does have mild constipation.  He is eating well and continues to gain weight.  Appetite and energy levels are 100%.  No pain is reported.  He is reportedly starting 5-FU cream for his cancer of the left forearm skin.   REVIEW OF SYSTEMS:  Review of Systems  Gastrointestinal: Positive for constipation.  All other systems reviewed and are negative.    PAST MEDICAL/SURGICAL HISTORY:  Past Medical History:  Diagnosis Date  . Actinic keratosis   . Arthritis   . Colitis MAY 2012    CT ABD/PELVIS HEP FLEXURE  . COPD (chronic obstructive pulmonary disease) ([place])   . Diabetes mellitus without complication ([place])   . History of kidney stones   . Hyperlipemia   . Hypertension   . NSAID long-term use NAPROXEN FOR OA  . SCL Ca dx'd 08/2018   Past Surgical History:  Procedure Laterality Date  . BACK SURGERY     spinal stenosis  . BASAL CELL CARCINOMA EXCISION  FACE, arms feet, leg  . CHOLECYSTECTOMY  JUNE 2011 MJ   STONES, PANCREATITIS  .  KNEE ARTHROSCOPY WITH MEDIAL MENISECTOMY Right 03/24/2015   Procedure: KNEE ARTHROSCOPY WITH MEDIAL MENISECTOMY;  Surgeon: Carole Civil, MD;  Location: [facility];  Service: Orthopedics;  Laterality: Right;  . KNEE SURGERY Left LEFT   arthroscopy  . LITHOTRIPSY  80s  . PORTACATH PLACEMENT Left 09/11/2018   Procedure: INSERTION PORT-A-CATH;  Surgeon: Virl Cagey, MD;  Location: [facility];  Service: General;  Laterality: Left;  . SPINE SURGERY       SOCIAL HISTORY:  Social History   Socioeconomic History  . Marital status: Divorced    Spouse name: Not on file  . Number of children: 0  . Years of education: Not on file  . Highest education level: Not on file  Occupational History  . Occupation: [place] [employer]   . Occupation: Manufacturing engineer   Social Needs  . Financial resource strain: Not hard at all  . Food insecurity    Worry: Never true    Inability: Never true  . Transportation needs    Medical: No    Non-medical: No  Tobacco Use  . Smoking status: Former Smoker    Packs/day: 1.00    Years: 50.00    Pack years:  50.00    Types: Cigarettes    Quit date: 08/07/2018    Years since quitting: 0.6  . Smokeless tobacco: Never Used  Substance and Sexual Activity  . Alcohol use: Yes    Comment: once a month  . Drug use: No  . Sexual activity: Never    Birth control/protection: None  Lifestyle  . Physical activity    Days per week: 0 days    Minutes per session: 0 min  . Stress: Not at all  Relationships  . Social Herbalist on phone: Never    Gets together: More than three times a week    Attends religious service: Never    Active member of club or organization: Yes    Attends meetings of clubs or organizations: More than 4 times per year    Relationship status: Divorced  . Intimate partner violence    Fear of current or ex partner: No    Emotionally abused: No    Physically abused: No    Forced sexual activity: No  Other Topics Concern  . 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    FAMILY HISTORY:  Family History  Problem Relation Age of Onset  . Cancer Mother        lung  . Cancer Father        lung and liver  . Colon polyps Neg Hx   . Colon cancer Neg Hx     CURRENT MEDICATIONS:  Outpatient Encounter Medications as of 04/01/2019  Medication Sig Note  . albuterol (PROVENTIL HFA;VENTOLIN HFA) 108 (90 Base) MCG/ACT inhaler Inhale 2 puffs into the lungs every 6 (six) hours as needed for wheezing or shortness of breath.    Marland Kitchen aspirin 81 MG tablet Take 81 mg by mouth daily.     . Cholecalciferol 50 MCG (2000 UT) TBDP Take 1 tablet by mouth daily.   Marland Kitchen docusate sodium (COLACE) 100 MG capsule Take 100 mg by mouth 2 (two) times daily.   Hunt Oris (IMFINZI IV) Inject into the vein every 21 ( twenty-one) days. 03/24/2019: Patient states that he was scheduled for injection last week but it was cancelled. Patient is due for injection for 03/2019  . metFORMIN (GLUCOPHAGE) 500 MG tablet Take 500 mg by mouth 2 (two) times daily with a meal.   . mirtazapine (REMERON) 15 MG tablet Take 1 tablet (15 mg total) by mouth at bedtime.   . Multiple Vitamin (MULTIVITAMIN) tablet Take 1 tablet by mouth daily.   . predniSONE (DELTASONE) 20 MG tablet Take 3 tablets (60 mg total) by mouth daily with breakfast. (Patient taking differently: Take 40 mg by mouth daily with breakfast. ) 03/24/2019: 2 tabs remaining in course  . rosuvastatin (CRESTOR) 10 MG tablet Take 10 mg by mouth daily.     . tamsulosin (FLOMAX) 0.4 MG CAPS capsule Take 0.4 mg by mouth daily.   . verapamil (COVERA HS) 240 MG (CO) 24 hr tablet Take 120 mg by mouth at bedtime.     No facility-administered encounter medications on file as of 04/01/2019.     ALLERGIES:  No Known Allergies   PHYSICAL EXAM:  ECOG Performance status: 1  Vitals:   04/01/19 1535  BP: 132/78  Pulse: 81  Resp:  20  Temp: 97.6 F (36.4 C)  SpO2: 96%   Filed Weights  04/01/19 1535  Weight: 167 lb 14.4 oz (76.2 kg)    Physical Exam Vitals signs reviewed.  Constitutional:      Appearance: Normal appearance.  Cardiovascular:     Rate and Rhythm: Normal rate and regular rhythm.     Heart sounds: Normal heart sounds.  Pulmonary:     Effort: Pulmonary effort is normal.     Breath sounds: Normal breath sounds.  Abdominal:     General: There is no distension.     Palpations: Abdomen is soft. There is no mass.  Musculoskeletal:        General: No swelling.  Lymphadenopathy:     Cervical: No cervical adenopathy.  Skin:    General: Skin is warm.  Neurological:     General: No focal deficit present.     Mental Status: He is alert and oriented to person, place, and time.  Psychiatric:        Mood and Affect: Mood normal.        Behavior: Behavior normal.      LABORATORY DATA:  I have reviewed the labs as listed.  CBC    Component Value Date/Time   WBC 10.1 04/01/2019 1412   RBC 3.69 (L) 04/01/2019 1412   HGB 12.1 (L) 04/01/2019 1412   HCT 36.8 (L) 04/01/2019 1412   PLT 189 04/01/2019 1412   MCV 99.7 04/01/2019 1412   MCH 32.8 04/01/2019 1412   MCHC 32.9 04/01/2019 1412   RDW 16.1 (H) 04/01/2019 1412   LYMPHSABS 0.6 (L) 04/01/2019 1412   MONOABS 0.2 04/01/2019 1412   EOSABS 0.0 04/01/2019 1412   BASOSABS 0.0 04/01/2019 1412   CMP Latest Ref Rng & Units 04/01/2019 03/24/2019 03/19/2019  Glucose 70 - 99 mg/dL 171(H) 121(H) 109(H)  BUN 8 - 23 mg/dL 19 21 15   Creatinine 0.61 - 1.24 mg/dL 0.71 0.69 0.62  Sodium 135 - 145 mmol/L 137 136 139  Potassium 3.5 - 5.1 mmol/L 4.5 4.3 4.5  Chloride 98 - 111 mmol/L 101 97(L) 99  CO2 22 - 32 mmol/L 27 27 30   Calcium 8.9 - 10.3 mg/dL 9.3 9.1 9.5  Total Protein 6.5 - 8.1 g/dL 6.1(L) 6.0(L) 6.2(L)  Total Bilirubin 0.3 - 1.2 mg/dL 0.6 0.6 0.6  Alkaline Phos 38 - 126 U/L 62 63 76  AST 15 - 41 U/L 18 18 20   ALT 0 - 44 U/L 16 19 24         DIAGNOSTIC IMAGING:  I have independently reviewed the scans and discussed with the patient.      ASSESSMENT & PLAN:   Small cell carcinoma of lung, right ([place]) 1.  Extensive stage small cell lung cancer: -4 cycles of carboplatin, VP-16 and durvalumab from 08/23/2020 10/28/2018. A CT CAP on 01/14/2019 showed continued further decrease in mediastinal, hilar and axillary adenopathy.  New tiny pericardial effusion and small right pleural effusion. -Last durvalumab on 02/13/2019, subsequently held due to colitis.  2.  Colitis: As she experienced diarrhea with 5 watery stools per day with blood in it. -He was started on prednisone 60 mg daily on 03/13/2019, being tapered weekly.  He is currently on 20 mg weekly. -He did not experience any bleeding per rectum in the last 1 week.  He is mildly constipated.  He does not report any diarrhea. -I will further cut back on prednisone to 10 mg daily.  We reviewed his blood work with hemoglobin dropped by one-point at 12.  3.  Brain metastases: - He had 3  small subcentimeter metastasis in the right insula, right occipital lobe and right cerebellar hemisphere, treated with whole brain RT from 09/02/2018 through 09/13/2018. - We discussed MRI brain results dated 03/20/2019 which showed continued resolution of the 3 small brain metastasis.  A small 5 mm area of subcortical white matter diffusion abnormality in the right frontal lobe without enlargement is probably sequela of XRT.  Total time spent is 25 minutes with more than 50% of the time spent face-to-face discussing treatment plan, counseling and coordination of care.    Orders placed this encounter:  No orders of the defined types were placed in this encounter.     Derek Jack, MD [facility] [phone]

## 2019-04-01 NOTE — Assessment & Plan Note (Signed)
1.  Extensive stage small cell lung cancer: -4 cycles of carboplatin, VP-16 and durvalumab from 08/23/2020 10/28/2018. A CT CAP on 01/14/2019 showed continued further decrease in mediastinal, hilar and axillary adenopathy.  New tiny pericardial effusion and small right pleural effusion. -Last durvalumab on 02/13/2019, subsequently held due to colitis.  2.  Colitis: As she experienced diarrhea with 5 watery stools per day with blood in it. -He was started on prednisone 60 mg daily on 03/13/2019, being tapered weekly.  He is currently on 20 mg weekly. -He did not experience any bleeding per rectum in the last 1 week.  He is mildly constipated.  He does not report any diarrhea. -I will further cut back on prednisone to 10 mg daily.  We reviewed his blood work with hemoglobin dropped by one-point at 12.  3.  Brain metastases: - He had 3 small subcentimeter metastasis in the right insula, right occipital lobe and right cerebellar hemisphere, treated with whole brain RT from 09/02/2018 through 09/13/2018. - We discussed MRI brain results dated 03/20/2019 which showed continued resolution of the 3 small brain metastasis.  A small 5 mm area of subcortical white matter diffusion abnormality in the right frontal lobe without enlargement is probably sequela of XRT.

## 2019-04-09 ENCOUNTER — Encounter (HOSPITAL_COMMUNITY): Payer: Self-pay | Admitting: Hematology

## 2019-04-09 ENCOUNTER — Inpatient Hospital Stay (HOSPITAL_COMMUNITY): Payer: Medicare Other | Attending: Hematology | Admitting: Hematology

## 2019-04-09 ENCOUNTER — Other Ambulatory Visit: Payer: Self-pay

## 2019-04-09 VITALS — BP 122/69 | HR 88 | Temp 98.6°F | Resp 20 | Wt 167.6 lb

## 2019-04-09 DIAGNOSIS — Z79899 Other long term (current) drug therapy: Secondary | ICD-10-CM | POA: Insufficient documentation

## 2019-04-09 DIAGNOSIS — J910 Malignant pleural effusion: Secondary | ICD-10-CM | POA: Diagnosis not present

## 2019-04-09 DIAGNOSIS — J449 Chronic obstructive pulmonary disease, unspecified: Secondary | ICD-10-CM | POA: Insufficient documentation

## 2019-04-09 DIAGNOSIS — Z9221 Personal history of antineoplastic chemotherapy: Secondary | ICD-10-CM | POA: Insufficient documentation

## 2019-04-09 DIAGNOSIS — Z7984 Long term (current) use of oral hypoglycemic drugs: Secondary | ICD-10-CM | POA: Diagnosis not present

## 2019-04-09 DIAGNOSIS — K529 Noninfective gastroenteritis and colitis, unspecified: Secondary | ICD-10-CM | POA: Insufficient documentation

## 2019-04-09 DIAGNOSIS — L570 Actinic keratosis: Secondary | ICD-10-CM | POA: Insufficient documentation

## 2019-04-09 DIAGNOSIS — I10 Essential (primary) hypertension: Secondary | ICD-10-CM | POA: Insufficient documentation

## 2019-04-09 DIAGNOSIS — I313 Pericardial effusion (noninflammatory): Secondary | ICD-10-CM | POA: Insufficient documentation

## 2019-04-09 DIAGNOSIS — M1990 Unspecified osteoarthritis, unspecified site: Secondary | ICD-10-CM | POA: Diagnosis not present

## 2019-04-09 DIAGNOSIS — C778 Secondary and unspecified malignant neoplasm of lymph nodes of multiple regions: Secondary | ICD-10-CM | POA: Insufficient documentation

## 2019-04-09 DIAGNOSIS — Z87891 Personal history of nicotine dependence: Secondary | ICD-10-CM | POA: Diagnosis not present

## 2019-04-09 DIAGNOSIS — C7931 Secondary malignant neoplasm of brain: Secondary | ICD-10-CM | POA: Diagnosis present

## 2019-04-09 DIAGNOSIS — C771 Secondary and unspecified malignant neoplasm of intrathoracic lymph nodes: Secondary | ICD-10-CM | POA: Insufficient documentation

## 2019-04-09 DIAGNOSIS — E785 Hyperlipidemia, unspecified: Secondary | ICD-10-CM | POA: Diagnosis not present

## 2019-04-09 DIAGNOSIS — C3491 Malignant neoplasm of unspecified part of right bronchus or lung: Secondary | ICD-10-CM | POA: Diagnosis present

## 2019-04-09 DIAGNOSIS — E119 Type 2 diabetes mellitus without complications: Secondary | ICD-10-CM | POA: Insufficient documentation

## 2019-04-09 NOTE — Patient Instructions (Addendum)
[place] at [facility] Discharge Instructions  You were seen today by Dr. Delton Coombes. He went over how you've been feeling since your last visit. He will see you back in 2 weeks for labs and follow up.  START taking 1/2 tablet of steroid every other day until we see you in 2 weeks.   Thank you for choosing [place] at [facility] to provide your oncology and hematology care.  To afford each patient quality time with our provider, please arrive at least 15 minutes before your scheduled appointment time.   If you have a lab appointment with the [place] please come in thru the  Main Entrance and check in at the main information desk  You need to re-schedule your appointment should you arrive 10 or more minutes late.  We strive to give you quality time with our providers, and arriving late affects you and other patients whose appointments are after yours.  Also, if you no show three or more times for appointments you may be dismissed from the clinic at the providers discretion.     Again, thank you for choosing [facility].  Our hope is that these requests will decrease the amount of time that you wait before being seen by our physicians.       _____________________________________________________________  Should you have questions after your visit to [facility], please contact our office at [phone] between the hours of 8:00 a.m. and 4:30 p.m.  Voicemails left after 4:00 p.m. will not be returned until the following business day.  For prescription refill requests, have your pharmacy contact our office and allow 72 hours.    Cancer Center Support Programs:   > Cancer Support Group  2nd Tuesday of the month 1pm-2pm, Journey Room

## 2019-04-09 NOTE — Progress Notes (Signed)
[place], [place] [address]   CLINIC:  Medical Oncology/Hematology  PCP:  Lucia Gaskins, [place] [address] [phone]   REASON FOR VISIT:  Follow-up for extensive stage small cell lung cancer with brain metastasis   CURRENT THERAPY:Durvalumab every 4 weeks.   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[place])  08/22/2018 Initial Diagnosis   Small cell carcinoma of lung, right ([place])   08/23/2018 - 11/17/2018 Chemotherapy   The patient had palonosetron (ALOXI) injection 0.25 mg, 0.25 mg, Intravenous,  Once, 4 of 4 cycles Administration: 0.25 mg (08/23/2018), 0.25 mg (09/16/2018), 0.25 mg (10/07/2018), 0.25 mg (10/28/2018) pegfilgrastim-cbqv (UDENYCA) injection 6 mg, 6 mg, Subcutaneous, Once, 4 of 4 cycles Administration: 6 mg (08/27/2018), 6 mg (09/20/2018), 6 mg (10/11/2018), 6 mg (11/01/2018) CARBOplatin (PARAPLATIN) 560 mg in sodium chloride 0.9 % 250 mL chemo infusion, 560 mg (100 % of original dose 564 mg), Intravenous,  Once, 4 of 4 cycles Dose modification:   (original dose 564 mg, Cycle 1),   (original dose 523.5 mg, Cycle 3),   (original dose 523.5 mg, Cycle 4) Administration: 560 mg (08/23/2018), 560 mg (09/16/2018), 520 mg (10/07/2018), 510 mg (10/28/2018) etoposide (VEPESID) 220 mg in sodium chloride 0.9 % 1,000 mL chemo infusion, 100 mg/m2 = 220 mg, Intravenous,  Once, 4 of 4 cycles Administration: 220 mg (08/23/2018), 200 mg (09/16/2018), 200 mg (09/17/2018), 200 mg (09/18/2018), 200 mg (10/07/2018), 200 mg (10/08/2018), 200 mg (10/09/2018), 200 mg (10/28/2018), 200 mg (10/29/2018), 200 mg (10/30/2018) fosaprepitant (EMEND) 150 mg, dexamethasone (DECADRON) 12 mg in sodium chloride 0.9 % 145 mL IVPB, , Intravenous,  Once, 4 of 4 cycles Administration:  (08/23/2018),  (09/16/2018),  (10/07/2018),  (10/28/2018) durvalumab (IMFINZI) 1,500 mg in sodium chloride 0.9  % 100 mL chemo infusion, 1,500 mg (100 % of original dose 1,500 mg), Intravenous,  Once, 3 of 3 cycles Dose modification: 1,500 mg (original dose 1,500 mg, Cycle 2, Reason: Provider Judgment) Administration: 1,500 mg (09/16/2018), 1,500 mg (10/07/2018), 1,500 mg (10/28/2018)  for chemotherapy treatment.    Small cell carcinoma of lung, right ([place])  08/23/2018 - 11/17/2018 Chemotherapy   The patient had palonosetron (ALOXI) injection 0.25 mg, 0.25 mg, Intravenous,  Once, 4 of 4 cycles Administration: 0.25 mg (08/23/2018), 0.25 mg (09/16/2018), 0.25 mg (10/07/2018), 0.25 mg (10/28/2018) pegfilgrastim-cbqv (UDENYCA) injection 6 mg, 6 mg, Subcutaneous, Once, 4 of 4 cycles Administration: 6 mg (08/27/2018), 6 mg (09/20/2018), 6 mg (10/11/2018), 6 mg (11/01/2018) CARBOplatin (PARAPLATIN) 560 mg in sodium chloride 0.9 % 250 mL chemo infusion, 560 mg (100 % of original dose 564 mg), Intravenous,  Once, 4 of 4 cycles Dose modification:   (original dose 564 mg, Cycle 1),   (original dose 523.5 mg, Cycle 3),   (original dose 523.5 mg, Cycle 4) Administration: 560 mg (08/23/2018), 560 mg (09/16/2018), 520 mg (10/07/2018), 510 mg (10/28/2018) etoposide (VEPESID) 220 mg in sodium chloride 0.9 % 1,000 mL chemo infusion, 100 mg/m2 = 220 mg, Intravenous,  Once, 4 of 4 cycles Administration: 220 mg (08/23/2018), 200 mg (09/16/2018), 200 mg (09/17/2018), 200 mg (09/18/2018), 200 mg (10/07/2018), 200 mg (10/08/2018), 200 mg (10/09/2018), 200 mg (10/28/2018), 200 mg (10/29/2018), 200 mg (10/30/2018) fosaprepitant (EMEND) 150 mg, dexamethasone (DECADRON) 12 mg in sodium chloride 0.9 % 145 mL IVPB, , Intravenous,  Once, 4 of 4 cycles Administration:  (08/23/2018),  (09/16/2018),  (10/07/2018),  (10/28/2018) durvalumab (IMFINZI) 1,500 mg  in sodium chloride 0.9 % 100 mL chemo infusion, 1,500 mg (100 % of original dose 1,500 mg), Intravenous,  Once, 3 of 3 cycles Dose modification: 1,500 mg (original dose 1,500 mg, Cycle 2, Reason: Provider Judgment)  Administration: 1,500 mg (09/16/2018), 1,500 mg (10/07/2018), 1,500 mg (10/28/2018)  for chemotherapy treatment.    08/26/2018 Initial Diagnosis   Small cell carcinoma of lung, right ([place])   11/20/2018 -  Chemotherapy   The patient had durvalumab (IMFINZI) 1,500 mg in sodium chloride 0.9 % 100 mL chemo infusion, 1,500 mg (100 % of original dose 1,500 mg), Intravenous,  Once, 4 of 9 cycles Dose modification: 1,500 mg (original dose 1,500 mg, Cycle 1, Reason: Other (see comments)) Administration: 1,500 mg (11/20/2018), 1,500 mg (12/19/2018), 1,500 mg (01/16/2019), 1,500 mg (02/13/2019)  for chemotherapy treatment.       CANCER STAGING: Cancer Staging No matching staging information was found for the patient.   INTERVAL HISTORY:  [name] 73 y.o. male seen for follow-up of small cell lung cancer and immune mediated colitis.  His durvalumab is on hold.  He is on prednisone 10 mg daily.  He is tolerating it very well.  He is even going to the gym and working out.  Appetite has been great.  Energy levels are 50%.  He does have easy bruising.  Denies any nausea, vomiting, diarrhea or constipation.  Denies any headaches or vision changes.   REVIEW OF SYSTEMS:  Review of Systems  Hematological: Bruises/bleeds easily.  All other systems reviewed and are negative.    PAST MEDICAL/SURGICAL HISTORY:  Past Medical History:  Diagnosis Date  . Actinic keratosis   . Arthritis   . Colitis MAY 2012    CT ABD/PELVIS HEP FLEXURE  . COPD (chronic obstructive pulmonary disease) ([place])   . Diabetes mellitus without complication ([place])   . History of kidney stones   . Hyperlipemia   . Hypertension   . NSAID long-term use NAPROXEN FOR OA  . SCL Ca dx'd 08/2018   Past Surgical History:  Procedure Laterality Date  . BACK SURGERY     spinal stenosis  . BASAL CELL CARCINOMA EXCISION  FACE, arms feet, leg  . CHOLECYSTECTOMY  JUNE 2011 MJ   STONES, PANCREATITIS  . KNEE ARTHROSCOPY WITH MEDIAL MENISECTOMY  Right 03/24/2015   Procedure: KNEE ARTHROSCOPY WITH MEDIAL MENISECTOMY;  Surgeon: Carole Civil, MD;  Location: [facility];  Service: Orthopedics;  Laterality: Right;  . KNEE SURGERY Left LEFT   arthroscopy  . LITHOTRIPSY  80s  . PORTACATH PLACEMENT Left 09/11/2018   Procedure: INSERTION PORT-A-CATH;  Surgeon: Virl Cagey, MD;  Location: [facility];  Service: General;  Laterality: Left;  . SPINE SURGERY       SOCIAL HISTORY:  Social History   Socioeconomic History  . Marital status: Divorced    Spouse name: Not on file  . Number of children: 0  . Years of education: Not on file  . Highest education level: Not on file  Occupational History  . Occupation: [place] [employer]   . Occupation: Manufacturing engineer   Social Needs  . Financial resource strain: Not hard at all  . Food insecurity    Worry: Never true    Inability: Never true  . Transportation needs    Medical: No    Non-medical: No  Tobacco Use  . Smoking status: Former Smoker    Packs/day: 1.00    Years: 50.00    Pack years: 50.00    Types:  Cigarettes    Quit date: 08/07/2018    Years since quitting: 0.6  . Smokeless tobacco: Never Used  Substance and Sexual Activity  . Alcohol use: Yes    Comment: once a month  . Drug use: No  . Sexual activity: Never    Birth control/protection: None  Lifestyle  . Physical activity    Days per week: 0 days    Minutes per session: 0 min  . Stress: Not at all  Relationships  . Social Herbalist on phone: Never    Gets together: More than three times a week    Attends religious service: Never    Active member of club or organization: Yes    Attends meetings of clubs or organizations: More than 4 times per year    Relationship status: Divorced  . Intimate partner violence    Fear of current or ex partner: No    Emotionally abused: No    Physically abused: No    Forced sexual activity: No  Other Topics Concern  . Not on file  Social History Narrative   Over 500  jumps ([place]), Armed forces operational officer. Used to work for [employer].    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    FAMILY HISTORY:  Family History  Problem Relation Age of Onset  . Cancer Mother        lung  . Cancer Father        lung and liver  . Colon polyps Neg Hx   . Colon cancer Neg Hx     CURRENT MEDICATIONS:  Outpatient Encounter Medications as of 04/09/2019  Medication Sig Note  . aspirin 81 MG tablet Take 81 mg by mouth daily.     . Cholecalciferol 50 MCG (2000 UT) TBDP Take 1 tablet by mouth daily.   Marland Kitchen docusate sodium (COLACE) 100 MG capsule Take 100 mg by mouth 2 (two) times daily.   [place] Oris (IMFINZI IV) Inject into the vein every 21 ( twenty-one) days. 03/24/2019: Patient states that he was scheduled for injection last week but it was cancelled. Patient is due for injection for 03/2019  . metFORMIN (GLUCOPHAGE) 500 MG tablet Take 500 mg by mouth 2 (two) times daily with a meal.   . mirtazapine (REMERON) 15 MG tablet Take 1 tablet (15 mg total) by mouth at bedtime.   . Multiple Vitamin (MULTIVITAMIN) tablet Take 1 tablet by mouth daily.   . predniSONE (DELTASONE) 20 MG tablet Take 3 tablets (60 mg total) by mouth daily with breakfast. (Patient taking differently: Take 40 mg by mouth daily with breakfast. ) 03/24/2019: 2 tabs remaining in course  . rosuvastatin (CRESTOR) 10 MG tablet Take 10 mg by mouth daily.     . tamsulosin (FLOMAX) 0.4 MG CAPS capsule Take 0.4 mg by mouth daily.   . verapamil (COVERA HS) 240 MG (CO) 24 hr tablet Take 120 mg by mouth at bedtime.    Marland Kitchen albuterol (PROVENTIL HFA;VENTOLIN HFA) 108 (90 Base) MCG/ACT inhaler Inhale 2 puffs into the lungs every 6 (six) hours as needed for wheezing or shortness of breath.    . fluorouracil (EFUDEX) 5 % cream APP AA OF SKIN ON BOTH ARMS BID FOR 3 WEEKS    No facility-administered encounter medications on file as of 04/09/2019.     ALLERGIES:  No Known Allergies   PHYSICAL EXAM:  ECOG Performance status: 1  Vitals:    04/09/19 1155  BP: 122/69  Pulse: 88  Resp: 20  Temp: 98.6 F (37 C)  SpO2: 94%   Filed Weights   04/09/19 1155  Weight: 167 lb 9.6 oz (76 kg)    Physical Exam Vitals signs reviewed.  Constitutional:      Appearance: Normal appearance.  Cardiovascular:     Rate and Rhythm: Normal rate and regular rhythm.     Heart sounds: Normal heart sounds.  Pulmonary:     Effort: Pulmonary effort is normal.     Breath sounds: Normal breath sounds.  Abdominal:     General: There is no distension.     Palpations: Abdomen is soft. There is no mass.  Musculoskeletal:        General: No swelling.  Lymphadenopathy:     Cervical: No cervical adenopathy.  Skin:    General: Skin is warm.  Neurological:     General: No focal deficit present.     Mental Status: He is alert and oriented to person, place, and time.  Psychiatric:        Mood and Affect: Mood normal.        Behavior: Behavior normal.      LABORATORY DATA:  I have reviewed the labs as listed.  CBC    Component Value Date/Time   WBC 10.1 04/01/2019 1412   RBC 3.69 (L) 04/01/2019 1412   HGB 12.1 (L) 04/01/2019 1412   HCT 36.8 (L) 04/01/2019 1412   PLT 189 04/01/2019 1412   MCV 99.7 04/01/2019 1412   MCH 32.8 04/01/2019 1412   MCHC 32.9 04/01/2019 1412   RDW 16.1 (H) 04/01/2019 1412   LYMPHSABS 0.6 (L) 04/01/2019 1412   MONOABS 0.2 04/01/2019 1412   EOSABS 0.0 04/01/2019 1412   BASOSABS 0.0 04/01/2019 1412   CMP Latest Ref Rng & Units 04/01/2019 03/24/2019 03/19/2019  Glucose 70 - 99 mg/dL 171(H) 121(H) 109(H)  BUN 8 - 23 mg/dL 19 21 15   Creatinine 0.61 - 1.24 mg/dL 0.71 0.69 0.62  Sodium 135 - 145 mmol/L 137 136 139  Potassium 3.5 - 5.1 mmol/L 4.5 4.3 4.5  Chloride 98 - 111 mmol/L 101 97(L) 99  CO2 22 - 32 mmol/L 27 27 30   Calcium 8.9 - 10.3 mg/dL 9.3 9.1 9.5  Total Protein 6.5 - 8.1 g/dL 6.1(L) 6.0(L) 6.2(L)  Total Bilirubin 0.3 - 1.2 mg/dL 0.6 0.6 0.6  Alkaline Phos 38 - 126 U/L 62 63 76  AST 15 - 41 U/L 18 18  20   ALT 0 - 44 U/L 16 19 24        DIAGNOSTIC IMAGING:  I have independently reviewed the scans and discussed with the patient.      ASSESSMENT & PLAN:   Small cell carcinoma of lung, right ([place]) 1.  Extensive stage small cell lung cancer: -4 cycles of carboplatin, VP-16 and durvalumab from 08/23/2018 through 10/28/2018. -CT CAP on 01/14/2019 showed continued further decrease in mediastinal, hilar and axillary adenopathy.  Tiny pericardial effusion and small right pleural effusion. -Last durvalumab dose on 02/13/2019, subsequently held due to colitis. - I plan to order scans at next visit.  2.  Colitis: -He experienced diarrhea with blood and was started on prednisone 60 mg daily on 03/13/2019. -We were tapering on a weekly basis as his diarrhea and bleeding improved. - Last taper was 10 mg daily on 04/01/2019. -He denies any diarrhea or skin rashes. - I have told him to cut back on prednisone to 10 mg every other day.  3.  Brain mets disease: - He had 3  small subcentimeter metastasis in the right insula, right occipital lobe and right cerebellar hemisphere, treated with whole brain RT from 09/02/2018 through 09/13/2018. -MRI of the brain on 03/20/2019 showed continued resolution of the 3 small brain metastasis.  Small 5 mm area of subcortical white matter diffusion abnormality in the right frontal lobe without enlargement is probably sequela of XRT.  Total time spent is 25 minutes with more than 50% of the time spent face-to-face discussing treatment plan, counseling and coordination of care.    Orders placed this encounter:  Orders Placed This Encounter  Procedures  . CBC with Differential/Platelet  . Comprehensive metabolic panel  . Lactate dehydrogenase      Derek Jack, MD [place] [phone]

## 2019-04-10 ENCOUNTER — Ambulatory Visit (HOSPITAL_COMMUNITY): Payer: Medicare Other | Admitting: Hematology

## 2019-04-10 ENCOUNTER — Other Ambulatory Visit (HOSPITAL_COMMUNITY): Payer: Medicare Other

## 2019-04-10 ENCOUNTER — Ambulatory Visit (HOSPITAL_COMMUNITY): Payer: Medicare Other

## 2019-04-12 ENCOUNTER — Encounter (HOSPITAL_COMMUNITY): Payer: Self-pay | Admitting: Hematology

## 2019-04-12 NOTE — Assessment & Plan Note (Signed)
1.  Extensive stage small cell lung cancer: -4 cycles of carboplatin, VP-16 and durvalumab from 08/23/2018 through 10/28/2018. -CT CAP on 01/14/2019 showed continued further decrease in mediastinal, hilar and axillary adenopathy.  Tiny pericardial effusion and small right pleural effusion. -Last durvalumab dose on 02/13/2019, subsequently held due to colitis. - I plan to order scans at next visit.  2.  Colitis: -He experienced diarrhea with blood and was started on prednisone 60 mg daily on 03/13/2019. -We were tapering on a weekly basis as his diarrhea and bleeding improved. - Last taper was 10 mg daily on 04/01/2019. -He denies any diarrhea or skin rashes. - I have told him to cut back on prednisone to 10 mg every other day.  3.  Brain mets disease: - He had 3 small subcentimeter metastasis in the right insula, right occipital lobe and right cerebellar hemisphere, treated with whole brain RT from 09/02/2018 through 09/13/2018. -MRI of the brain on 03/20/2019 showed continued resolution of the 3 small brain metastasis.  Small 5 mm area of subcortical white matter diffusion abnormality in the right frontal lobe without enlargement is probably sequela of XRT.

## 2019-04-22 ENCOUNTER — Encounter (HOSPITAL_COMMUNITY): Payer: Self-pay | Admitting: Hematology

## 2019-04-22 ENCOUNTER — Other Ambulatory Visit: Payer: Self-pay

## 2019-04-22 ENCOUNTER — Inpatient Hospital Stay (HOSPITAL_COMMUNITY): Payer: Medicare Other

## 2019-04-22 ENCOUNTER — Inpatient Hospital Stay (HOSPITAL_BASED_OUTPATIENT_CLINIC_OR_DEPARTMENT_OTHER): Payer: Medicare Other | Admitting: Hematology

## 2019-04-22 DIAGNOSIS — C3491 Malignant neoplasm of unspecified part of right bronchus or lung: Secondary | ICD-10-CM

## 2019-04-22 LAB — CBC WITH DIFFERENTIAL/PLATELET
Abs Immature Granulocytes: 0.03 10*3/uL (ref 0.00–0.07)
Basophils Absolute: 0 10*3/uL (ref 0.0–0.1)
Basophils Relative: 0 %
Eosinophils Absolute: 0.1 10*3/uL (ref 0.0–0.5)
Eosinophils Relative: 1 %
HCT: 36.7 % — ABNORMAL LOW (ref 39.0–52.0)
Hemoglobin: 12.1 g/dL — ABNORMAL LOW (ref 13.0–17.0)
Immature Granulocytes: 0 %
Lymphocytes Relative: 12 %
Lymphs Abs: 0.9 10*3/uL (ref 0.7–4.0)
MCH: 32.4 pg (ref 26.0–34.0)
MCHC: 33 g/dL (ref 30.0–36.0)
MCV: 98.4 fL (ref 80.0–100.0)
Monocytes Absolute: 0.6 10*3/uL (ref 0.1–1.0)
Monocytes Relative: 8 %
Neutro Abs: 5.6 10*3/uL (ref 1.7–7.7)
Neutrophils Relative %: 79 %
Platelets: 272 10*3/uL (ref 150–400)
RBC: 3.73 MIL/uL — ABNORMAL LOW (ref 4.22–5.81)
RDW: 15 % (ref 11.5–15.5)
WBC: 7.1 10*3/uL (ref 4.0–10.5)
nRBC: 0 % (ref 0.0–0.2)

## 2019-04-22 LAB — COMPREHENSIVE METABOLIC PANEL
ALT: 14 U/L (ref 0–44)
AST: 23 U/L (ref 15–41)
Albumin: 3.1 g/dL — ABNORMAL LOW (ref 3.5–5.0)
Alkaline Phosphatase: 86 U/L (ref 38–126)
Anion gap: 11 (ref 5–15)
BUN: 16 mg/dL (ref 8–23)
CO2: 26 mmol/L (ref 22–32)
Calcium: 9.7 mg/dL (ref 8.9–10.3)
Chloride: 102 mmol/L (ref 98–111)
Creatinine, Ser: 0.83 mg/dL (ref 0.61–1.24)
GFR calc Af Amer: >60 mL/min (ref >60)
GFR calc non Af Amer: >60 mL/min (ref >60)
Glucose, Bld: 170 mg/dL — ABNORMAL HIGH (ref 70–99)
Potassium: 4.5 mmol/L (ref 3.5–5.1)
Sodium: 139 mmol/L (ref 135–145)
Total Bilirubin: 0.8 mg/dL (ref 0.3–1.2)
Total Protein: 6.4 g/dL — ABNORMAL LOW (ref 6.5–8.1)

## 2019-04-22 LAB — LACTATE DEHYDROGENASE: LDH: 144 U/L (ref 98–192)

## 2019-04-22 NOTE — Assessment & Plan Note (Addendum)
1.  Extensive stage small cell lung cancer: -4 cycles of carboplatin, VP-16 and durvalumab from 08/23/2018 through 10/28/2018. -CT CAP on 01/14/2019 showed continued further decrease in mediastinal, hilar and axillary adenopathy.  Tiny pericardial effusion and small right pleural effusion. -Last durvalumab on 02/13/2019, subsequently held due to colitis. -I plan to repeat scans at next visit.  2.  Colitis: -He experienced diarrhea with blood and was started on prednisone 60 mg daily on 03/13/2019. -We were tapering on a weekly basis as his diarrhea and bleeding improved. - Last taper was 10 mg daily on 04/01/2019. -He denies any diarrhea or skin rashes. -I have told him to take prednisone 10 mg on Saturday and next Tuesday and stop after that. - He lost about 6 pounds in the last 2 weeks.  However  he reports that he has been going to the gym 4 times a day.  He is also drinking 2 cans of boost per day and eating 3 meals a day.  3.  Brain mets disease: - He had 3 small subcentimeter metastasis in the right insula, right occipital lobe and right cerebellar hemisphere, treated with whole brain RT from 09/02/2018 through 09/13/2018. -MRI of the brain on 03/20/2019 showed continued resolution of the 3 small brain metastasis.  Small 5 mm area of subcortical white matter diffusion abnormality in the right frontal lobe without enlargement is probably sequela of XRT.

## 2019-04-22 NOTE — Patient Instructions (Signed)
[place] at [facility] Discharge Instructions  You were seen today by Dr. Delton Coombes. He went over your recent lab results. He will see you back in 2 weeks for labs and follow up.  Take 1/2 tablet of steroid on Saturday and another 1/2 tablet next Tuesday then stop taking completely Thank you for choosing [place] at [facility] to provide your oncology and hematology care.  To afford each patient quality time with our provider, please arrive at least 15 minutes before your scheduled appointment time.   If you have a lab appointment with the [place] please come in thru the  Main Entrance and check in at the main information desk  You need to re-schedule your appointment should you arrive 10 or more minutes late.  We strive to give you quality time with our providers, and arriving late affects you and other patients whose appointments are after yours.  Also, if you no show three or more times for appointments you may be dismissed from the clinic at the providers discretion.     Again, thank you for choosing [facility].  Our hope is that these requests will decrease the amount of time that you wait before being seen by our physicians.       _____________________________________________________________  Should you have questions after your visit to [facility], please contact our office at [phone] between the hours of 8:00 a.m. and 4:30 p.m.  Voicemails left after 4:00 p.m. will not be returned until the following business day.  For prescription refill requests, have your pharmacy contact our office and allow 72 hours.    Cancer Center Support Programs:   > Cancer Support Group  2nd Tuesday of the month 1pm-2pm, Journey Room

## 2019-04-22 NOTE — Progress Notes (Signed)
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[place])  08/22/2018 Initial Diagnosis   Small cell carcinoma of lung, right ([place])   08/23/2018 - 11/17/2018 Chemotherapy   The patient had palonosetron (ALOXI) injection 0.25 mg, 0.25 mg, Intravenous,  Once, 4 of 4 cycles Administration: 0.25 mg (08/23/2018), 0.25 mg (09/16/2018), 0.25 mg (10/07/2018), 0.25 mg (10/28/2018) pegfilgrastim-cbqv (UDENYCA) injection 6 mg, 6 mg, Subcutaneous, Once, 4 of 4 cycles Administration: 6 mg (08/27/2018), 6 mg (09/20/2018), 6 mg (10/11/2018), 6 mg (11/01/2018) CARBOplatin (PARAPLATIN) 560 mg in sodium chloride 0.9 % 250 mL chemo infusion, 560 mg (100 % of original dose 564 mg), Intravenous,  Once, 4 of 4 cycles Dose modification:   (original dose 564 mg, Cycle 1),   (original dose 523.5 mg, Cycle 3),   (original dose 523.5 mg, Cycle 4) Administration: 560 mg (08/23/2018), 560 mg (09/16/2018), 520 mg (10/07/2018), 510 mg (10/28/2018) etoposide (VEPESID) 220 mg in sodium chloride 0.9 % 1,000 mL chemo infusion, 100 mg/m2 = 220 mg, Intravenous,  Once, 4 of 4 cycles Administration: 220 mg (08/23/2018), 200 mg (09/16/2018), 200 mg (09/17/2018), 200 mg (09/18/2018), 200 mg (10/07/2018), 200 mg (10/08/2018), 200 mg (10/09/2018), 200 mg (10/28/2018), 200 mg (10/29/2018), 200 mg (10/30/2018) fosaprepitant (EMEND) 150 mg, dexamethasone (DECADRON) 12 mg in sodium chloride 0.9 % 145 mL IVPB, , Intravenous,  Once, 4 of 4 cycles Administration:  (08/23/2018),  (09/16/2018),  (10/07/2018),  (10/28/2018) durvalumab (IMFINZI) 1,500 mg in sodium chloride 0.9  % 100 mL chemo infusion, 1,500 mg (100 % of original dose 1,500 mg), Intravenous,  Once, 3 of 3 cycles Dose modification: 1,500 mg (original dose 1,500 mg, Cycle 2, Reason: Provider Judgment) Administration: 1,500 mg (09/16/2018), 1,500 mg (10/07/2018), 1,500 mg (10/28/2018)  for chemotherapy treatment.    Small cell carcinoma of lung, right ([place])  08/23/2018 - 11/17/2018 Chemotherapy   The patient had palonosetron (ALOXI) injection 0.25 mg, 0.25 mg, Intravenous,  Once, 4 of 4 cycles Administration: 0.25 mg (08/23/2018), 0.25 mg (09/16/2018), 0.25 mg (10/07/2018), 0.25 mg (10/28/2018) pegfilgrastim-cbqv (UDENYCA) injection 6 mg, 6 mg, Subcutaneous, Once, 4 of 4 cycles Administration: 6 mg (08/27/2018), 6 mg (09/20/2018), 6 mg (10/11/2018), 6 mg (11/01/2018) CARBOplatin (PARAPLATIN) 560 mg in sodium chloride 0.9 % 250 mL chemo infusion, 560 mg (100 % of original dose 564 mg), Intravenous,  Once, 4 of 4 cycles Dose modification:   (original dose 564 mg, Cycle 1),   (original dose 523.5 mg, Cycle 3),   (original dose 523.5 mg, Cycle 4) Administration: 560 mg (08/23/2018), 560 mg (09/16/2018), 520 mg (10/07/2018), 510 mg (10/28/2018) etoposide (VEPESID) 220 mg in sodium chloride 0.9 % 1,000 mL chemo infusion, 100 mg/m2 = 220 mg, Intravenous,  Once, 4 of 4 cycles Administration: 220 mg (08/23/2018), 200 mg (09/16/2018), 200 mg (09/17/2018), 200 mg (09/18/2018), 200 mg (10/07/2018), 200 mg (10/08/2018), 200 mg (10/09/2018), 200 mg (10/28/2018), 200 mg (10/29/2018), 200 mg (10/30/2018) fosaprepitant (EMEND) 150 mg, dexamethasone (DECADRON) 12 mg in sodium chloride 0.9 % 145 mL IVPB, , Intravenous,  Once, 4 of 4 cycles Administration:  (08/23/2018),  (09/16/2018),  (10/07/2018),  (10/28/2018) durvalumab (IMFINZI) 1,500 mg  in sodium chloride 0.9 % 100 mL chemo infusion, 1,500 mg (100 % of original dose 1,500 mg), Intravenous,  Once, 3 of 3 cycles Dose modification: 1,500 mg (original dose 1,500 mg, Cycle 2, Reason: Provider Judgment)  Administration: 1,500 mg (09/16/2018), 1,500 mg (10/07/2018), 1,500 mg (10/28/2018)  for chemotherapy treatment.    08/26/2018 Initial Diagnosis   Small cell carcinoma of lung, right ([place])   11/20/2018 -  Chemotherapy   The patient had durvalumab (IMFINZI) 1,500 mg in sodium chloride 0.9 % 100 mL chemo infusion, 1,500 mg (100 % of original dose 1,500 mg), Intravenous,  Once, 4 of 9 cycles Dose modification: 1,500 mg (original dose 1,500 mg, Cycle 1, Reason: Other (see comments)) Administration: 1,500 mg (11/20/2018), 1,500 mg (12/19/2018), 1,500 mg (01/16/2019), 1,500 mg (02/13/2019)  for chemotherapy treatment.       CANCER STAGING: Cancer Staging No matching staging information was found for the patient.   INTERVAL HISTORY:  Mr. Kimura 73 y.o. male seen for follow-up of colitis.  His durvalumab is on hold.  Appetite is 50%.  Energy levels are 25%.  He lost about 6 pounds from last 2 weeks.  He is taking prednisone half tablet every other day.  He is eating 3 meals a day.  He is drinking 2 cans of boost per day.  He is going to the gym 4 days/week.  He denies any diarrhea or bleeding per rectum.  Denies any nausea or vomiting.  No fevers or infections.  Easy bruising is stable.  REVIEW OF SYSTEMS:  Review of Systems  Hematological: Bruises/bleeds easily.  All other systems reviewed and are negative.    PAST MEDICAL/SURGICAL HISTORY:  Past Medical History:  Diagnosis Date  . Actinic keratosis   . Arthritis   . Colitis MAY 2012    CT ABD/PELVIS HEP FLEXURE  . COPD (chronic obstructive pulmonary disease) ([facility])   . Diabetes mellitus without complication ([facility])   . History of kidney stones   . Hyperlipemia   . Hypertension   . NSAID long-term use NAPROXEN FOR OA  . SCL Ca dx'd 08/2018   Past Surgical History:  Procedure Laterality Date  . BACK SURGERY     spinal stenosis  . BASAL CELL CARCINOMA EXCISION  FACE, arms feet, leg  . CHOLECYSTECTOMY  JUNE 2011 MJ   STONES, PANCREATITIS   . KNEE ARTHROSCOPY WITH MEDIAL MENISECTOMY Right 03/24/2015   Procedure: KNEE ARTHROSCOPY WITH MEDIAL MENISECTOMY;  Surgeon: Carole Civil, MD;  Location: [facility];  Service: Orthopedics;  Laterality: Right;  . KNEE SURGERY Left LEFT   arthroscopy  . LITHOTRIPSY  80s  . PORTACATH PLACEMENT Left 09/11/2018   Procedure: INSERTION PORT-A-CATH;  Surgeon: Virl Cagey, MD;  Location: [facility];  Service: General;  Laterality: Left;  . SPINE SURGERY       SOCIAL HISTORY:  Social History   Socioeconomic History  . Marital status: Divorced    Spouse name: Not on file  . Number of children: 0  . Years of education: Not on file  . Highest education level: Not on file  Occupational History  . Occupation: [place] [employer]   . Occupation: Manufacturing engineer   Social Needs  . Financial resource strain: Not hard at all  . Food insecurity    Worry: Never true    Inability: Never true  . Transportation needs    Medical: No    Non-medical: No  Tobacco Use  . Smoking status: Former Smoker    Packs/day:  1.00    Years: 50.00    Pack years: 50.00    Types: Cigarettes    Quit date: 08/07/2018    Years since quitting: 0.7  . Smokeless tobacco: Never Used  Substance and Sexual Activity  . Alcohol use: Yes    Comment: once a month  . Drug use: No  . Sexual activity: Never    Birth control/protection: None  Lifestyle  . Physical activity    Days per week: 0 days    Minutes per session: 0 min  . Stress: Not at all  Relationships  . Social Herbalist on phone: Never    Gets together: More than three times a week    Attends religious service: Never    Active member of club or organization: Yes    Attends meetings of clubs or organizations: More than 4 times per year    Relationship status: Divorced  . Intimate partner violence    Fear of current or ex partner: No    Emotionally abused: No    Physically abused: No    Forced sexual activity: No  Other Topics Concern  . Not on  file  Social History Narrative   Over 500 jumps ([place]), Armed forces operational officer. Used to work for [employer].    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    FAMILY HISTORY:  Family History  Problem Relation Age of Onset  . Cancer Mother        lung  . Cancer Father        lung and liver  . Colon polyps Neg Hx   . Colon cancer Neg Hx     CURRENT MEDICATIONS:  Outpatient Encounter Medications as of 04/22/2019  Medication Sig Note  . aspirin 81 MG tablet Take 81 mg by mouth daily.     . Cholecalciferol 50 MCG (2000 UT) TBDP Take 1 tablet by mouth daily.   Marland Kitchen docusate sodium (COLACE) 100 MG capsule Take 100 mg by mouth 2 (two) times daily.   . fluorouracil (EFUDEX) 5 % cream APP AA OF SKIN ON BOTH ARMS BID FOR 3 WEEKS   . metFORMIN (GLUCOPHAGE) 500 MG tablet Take 500 mg by mouth 2 (two) times daily with a meal.   . mirtazapine (REMERON) 15 MG tablet Take 1 tablet (15 mg total) by mouth at bedtime.   . Multiple Vitamin (MULTIVITAMIN) tablet Take 1 tablet by mouth daily.   . predniSONE (DELTASONE) 20 MG tablet Take 3 tablets (60 mg total) by mouth daily with breakfast. (Patient taking differently: Take 40 mg by mouth daily with breakfast. 1/2 tablet every other day per pt.) 03/24/2019: 2 tabs remaining in course  . rosuvastatin (CRESTOR) 10 MG tablet Take 10 mg by mouth daily.     . verapamil (COVERA HS) 240 MG (CO) 24 hr tablet Take 120 mg by mouth at bedtime.    Marland Kitchen albuterol (PROVENTIL HFA;VENTOLIN HFA) 108 (90 Base) MCG/ACT inhaler Inhale 2 puffs into the lungs every 6 (six) hours as needed for wheezing or shortness of breath.    Hunt Oris (IMFINZI IV) Inject into the vein every 21 ( twenty-one) days. 03/24/2019: Patient states that he was scheduled for injection last week but it was cancelled. Patient is due for injection for 03/2019  . [DISCONTINUED] tamsulosin (FLOMAX) 0.4 MG CAPS capsule Take 0.4 mg by mouth daily.    No facility-administered encounter medications on file as of 04/22/2019.      ALLERGIES:  No Known Allergies  PHYSICAL EXAM:  ECOG Performance status: 1  Vitals:   04/22/19 1116  BP: 121/80  Pulse: 68  Resp: 14  Temp: (!) 97.5 F (36.4 C)  SpO2: 96%   Filed Weights   04/22/19 1116  Weight: 160 lb 9.6 oz (72.8 kg)    Physical Exam Vitals signs reviewed.  Constitutional:      Appearance: Normal appearance.  Cardiovascular:     Rate and Rhythm: Normal rate and regular rhythm.     Heart sounds: Normal heart sounds.  Pulmonary:     Effort: Pulmonary effort is normal.     Breath sounds: Normal breath sounds.  Abdominal:     General: There is no distension.     Palpations: Abdomen is soft. There is no mass.  Musculoskeletal:        General: No swelling.  Lymphadenopathy:     Cervical: No cervical adenopathy.  Skin:    General: Skin is warm.  Neurological:     General: No focal deficit present.     Mental Status: He is alert and oriented to person, place, and time.  Psychiatric:        Mood and Affect: Mood normal.        Behavior: Behavior normal.      LABORATORY DATA:  I have reviewed the labs as listed.  CBC    Component Value Date/Time   WBC 7.1 04/22/2019 0903   RBC 3.73 (L) 04/22/2019 0903   HGB 12.1 (L) 04/22/2019 0903   HCT 36.7 (L) 04/22/2019 0903   PLT 272 04/22/2019 0903   MCV 98.4 04/22/2019 0903   MCH 32.4 04/22/2019 0903   MCHC 33.0 04/22/2019 0903   RDW 15.0 04/22/2019 0903   LYMPHSABS 0.9 04/22/2019 0903   MONOABS 0.6 04/22/2019 0903   EOSABS 0.1 04/22/2019 0903   BASOSABS 0.0 04/22/2019 0903   CMP Latest Ref Rng & Units 04/22/2019 04/01/2019 03/24/2019  Glucose 70 - 99 mg/dL 170(H) 171(H) 121(H)  BUN 8 - 23 mg/dL 16 19 21   Creatinine 0.61 - 1.24 mg/dL 0.83 0.71 0.69  Sodium 135 - 145 mmol/L 139 137 136  Potassium 3.5 - 5.1 mmol/L 4.5 4.5 4.3  Chloride 98 - 111 mmol/L 102 101 97(L)  CO2 22 - 32 mmol/L 26 27 27   Calcium 8.9 - 10.3 mg/dL 9.7 9.3 9.1  Total Protein 6.5 - 8.1 g/dL 6.4(L) 6.1(L) 6.0(L)  Total  Bilirubin 0.3 - 1.2 mg/dL 0.8 0.6 0.6  Alkaline Phos 38 - 126 U/L 86 62 63  AST 15 - 41 U/L 23 18 18   ALT 0 - 44 U/L 14 16 19        DIAGNOSTIC IMAGING:  I have independently reviewed the scans and discussed with the patient.      ASSESSMENT & PLAN:   Small cell carcinoma of lung, right ([place]) 1.  Extensive stage small cell lung cancer: -4 cycles of carboplatin, VP-16 and durvalumab from 08/23/2018 through 10/28/2018. -CT CAP on 01/14/2019 showed continued further decrease in mediastinal, hilar and axillary adenopathy.  Tiny pericardial effusion and small right pleural effusion. -Last durvalumab on 02/13/2019, subsequently held due to colitis. -I plan to repeat scans at next visit.  2.  Colitis: -He experienced diarrhea with blood and was started on prednisone 60 mg daily on 03/13/2019. -We were tapering on a weekly basis as his diarrhea and bleeding improved. - Last taper was 10 mg daily on 04/01/2019. -He denies any diarrhea or skin rashes. -I have told him to take  prednisone 10 mg on Saturday and next Tuesday and stop after that. - He lost about 6 pounds in the last 2 weeks.  However  he reports that he has been going to the gym 4 times a day.  He is also drinking 2 cans of boost per day and eating 3 meals a day.  3.  Brain mets disease: - He had 3 small subcentimeter metastasis in the right insula, right occipital lobe and right cerebellar hemisphere, treated with whole brain RT from 09/02/2018 through 09/13/2018. -MRI of the brain on 03/20/2019 showed continued resolution of the 3 small brain metastasis.  Small 5 mm area of subcortical white matter diffusion abnormality in the right frontal lobe without enlargement is probably sequela of XRT.  Total time spent is 25 minutes with more than 50% of the time spent face-to-face discussing treatment plan, counseling and coordination of care.    Orders placed this encounter:  No orders of the defined types were placed in this encounter.      Derek Jack, MD [place] [phone]

## 2019-05-07 ENCOUNTER — Encounter (HOSPITAL_COMMUNITY): Payer: Self-pay | Admitting: Hematology

## 2019-05-07 ENCOUNTER — Other Ambulatory Visit: Payer: Self-pay

## 2019-05-07 ENCOUNTER — Other Ambulatory Visit (HOSPITAL_COMMUNITY): Payer: Medicare Other

## 2019-05-07 ENCOUNTER — Inpatient Hospital Stay (HOSPITAL_BASED_OUTPATIENT_CLINIC_OR_DEPARTMENT_OTHER): Payer: Medicare Other | Admitting: Hematology

## 2019-05-07 VITALS — BP 117/86 | HR 85 | Temp 98.3°F | Resp 18 | Wt 159.8 lb

## 2019-05-07 DIAGNOSIS — C3491 Malignant neoplasm of unspecified part of right bronchus or lung: Secondary | ICD-10-CM

## 2019-05-07 NOTE — Assessment & Plan Note (Signed)
1.  Extensive stage small cell lung cancer: -4 cycles of carboplatin, VP-16 and durvalumab from 08/23/2018 through 10/28/2018. -CT CAP on 01/14/2019 showed continued further decrease in mediastinal, hilar and axillary adenopathy.  Tiny pericardial effusion and small right pleural effusion. -Last durvalumab on 02/13/2019, held due to colitis. -Denies any chest pains or cough.  I have recommended repeating CT CAP prior to next visit in 2 weeks.  2.  Colitis: -He experienced diarrhea with blood and was started on prednisone 60 mg daily on 03/13/2019. -We were tapering on a weekly basis as his diarrhea and bleeding improved. - Prednisone tapered off around 04/27/2019. - Denies any diarrhea or bleeding per rectum. -He lost about 1 pound since last visit.   3.  Brain mets disease: - He had 3 small subcentimeter metastasis in the right insula, right occipital lobe and right cerebellar hemisphere, treated with whole brain RT from 09/02/2018 through 09/13/2018. -MRI of the brain on 03/20/2019 showed continued resolution of the 3 small brain metastasis.  Small 5 mm area of subcortical white matter diffusion abnormality in the right frontal lobe without enlargement is probably sequela of XRT.

## 2019-05-07 NOTE — Patient Instructions (Addendum)
[place] at [facility] Discharge Instructions  You were seen today by Dr. Delton Coombes. He went over your recent lab results. He will schedule you for repeat scans. He will see you back in 2 weeks for labs and follow up.   Thank you for choosing [place] at [facility] to provide your oncology and hematology care.  To afford each patient quality time with our provider, please arrive at least 15 minutes before your scheduled appointment time.   If you have a lab appointment with the [facility] please come in thru the  Main Entrance and check in at the main information desk  You need to re-schedule your appointment should you arrive 10 or more minutes late.  We strive to give you quality time with our providers, and arriving late affects you and other patients whose appointments are after yours.  Also, if you no show three or more times for appointments you may be dismissed from the clinic at the providers discretion.     Again, thank you for choosing [facility].  Our hope is that these requests will decrease the amount of time that you wait before being seen by our physicians.       _____________________________________________________________  Should you have questions after your visit to [facility], please contact our office at [phone] between the hours of 8:00 a.m. and 4:30 p.m.  Voicemails left after 4:00 p.m. will not be returned until the following business day.  For prescription refill requests, have your pharmacy contact our office and allow 72 hours.    Cancer Center Support Programs:   > Cancer Support Group  2nd Tuesday of the month 1pm-2pm, Journey Room

## 2019-05-07 NOTE — Progress Notes (Signed)
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[place])  08/22/2018 Initial Diagnosis   Small cell carcinoma of lung, right ([place])   08/23/2018 - 11/17/2018 Chemotherapy   The patient had palonosetron (ALOXI) injection 0.25 mg, 0.25 mg, Intravenous,  Once, 4 of 4 cycles Administration: 0.25 mg (08/23/2018), 0.25 mg (09/16/2018), 0.25 mg (10/07/2018), 0.25 mg (10/28/2018) pegfilgrastim-cbqv (UDENYCA) injection 6 mg, 6 mg, Subcutaneous, Once, 4 of 4 cycles Administration: 6 mg (08/27/2018), 6 mg (09/20/2018), 6 mg (10/11/2018), 6 mg (11/01/2018) CARBOplatin (PARAPLATIN) 560 mg in sodium chloride 0.9 % 250 mL chemo infusion, 560 mg (100 % of original dose 564 mg), Intravenous,  Once, 4 of 4 cycles Dose modification:   (original dose 564 mg, Cycle 1),   (original dose 523.5 mg, Cycle 3),   (original dose 523.5 mg, Cycle 4) Administration: 560 mg (08/23/2018), 560 mg (09/16/2018), 520 mg (10/07/2018), 510 mg (10/28/2018) etoposide (VEPESID) 220 mg in sodium chloride 0.9 % 1,000 mL chemo infusion, 100 mg/m2 = 220 mg, Intravenous,  Once, 4 of 4 cycles Administration: 220 mg (08/23/2018), 200 mg (09/16/2018), 200 mg (09/17/2018), 200 mg (09/18/2018), 200 mg (10/07/2018), 200 mg (10/08/2018), 200 mg (10/09/2018), 200 mg (10/28/2018), 200 mg (10/29/2018), 200 mg (10/30/2018) fosaprepitant (EMEND) 150 mg, dexamethasone (DECADRON) 12 mg in sodium chloride 0.9 % 145 mL IVPB, , Intravenous,  Once, 4 of 4 cycles Administration:  (08/23/2018),  (09/16/2018),  (10/07/2018),  (10/28/2018) durvalumab (IMFINZI) 1,500 mg in sodium chloride 0.9  % 100 mL chemo infusion, 1,500 mg (100 % of original dose 1,500 mg), Intravenous,  Once, 3 of 3 cycles Dose modification: 1,500 mg (original dose 1,500 mg, Cycle 2, Reason: Provider Judgment) Administration: 1,500 mg (09/16/2018), 1,500 mg (10/07/2018), 1,500 mg (10/28/2018)  for chemotherapy treatment.    Small cell carcinoma of lung, right ([place])  08/23/2018 - 11/17/2018 Chemotherapy   The patient had palonosetron (ALOXI) injection 0.25 mg, 0.25 mg, Intravenous,  Once, 4 of 4 cycles Administration: 0.25 mg (08/23/2018), 0.25 mg (09/16/2018), 0.25 mg (10/07/2018), 0.25 mg (10/28/2018) pegfilgrastim-cbqv (UDENYCA) injection 6 mg, 6 mg, Subcutaneous, Once, 4 of 4 cycles Administration: 6 mg (08/27/2018), 6 mg (09/20/2018), 6 mg (10/11/2018), 6 mg (11/01/2018) CARBOplatin (PARAPLATIN) 560 mg in sodium chloride 0.9 % 250 mL chemo infusion, 560 mg (100 % of original dose 564 mg), Intravenous,  Once, 4 of 4 cycles Dose modification:   (original dose 564 mg, Cycle 1),   (original dose 523.5 mg, Cycle 3),   (original dose 523.5 mg, Cycle 4) Administration: 560 mg (08/23/2018), 560 mg (09/16/2018), 520 mg (10/07/2018), 510 mg (10/28/2018) etoposide (VEPESID) 220 mg in sodium chloride 0.9 % 1,000 mL chemo infusion, 100 mg/m2 = 220 mg, Intravenous,  Once, 4 of 4 cycles Administration: 220 mg (08/23/2018), 200 mg (09/16/2018), 200 mg (09/17/2018), 200 mg (09/18/2018), 200 mg (10/07/2018), 200 mg (10/08/2018), 200 mg (10/09/2018), 200 mg (10/28/2018), 200 mg (10/29/2018), 200 mg (10/30/2018) fosaprepitant (EMEND) 150 mg, dexamethasone (DECADRON) 12 mg in sodium chloride 0.9 % 145 mL IVPB, , Intravenous,  Once, 4 of 4 cycles Administration:  (08/23/2018),  (09/16/2018),  (10/07/2018),  (10/28/2018) durvalumab (IMFINZI) 1,500 mg  in sodium chloride 0.9 % 100 mL chemo infusion, 1,500 mg (100 % of original dose 1,500 mg), Intravenous,  Once, 3 of 3 cycles Dose modification: 1,500 mg (original dose 1,500 mg, Cycle 2, Reason: Provider Judgment)  Administration: 1,500 mg (09/16/2018), 1,500 mg (10/07/2018), 1,500 mg (10/28/2018)  for chemotherapy treatment.    08/26/2018 Initial Diagnosis   Small cell carcinoma of lung, right ([place])   11/20/2018 -  Chemotherapy   The patient had durvalumab (IMFINZI) 1,500 mg in sodium chloride 0.9 % 100 mL chemo infusion, 1,500 mg (100 % of original dose 1,500 mg), Intravenous,  Once, 4 of 9 cycles Dose modification: 1,500 mg (original dose 1,500 mg, Cycle 1, Reason: Other (see comments)) Administration: 1,500 mg (11/20/2018), 1,500 mg (12/19/2018), 1,500 mg (01/16/2019), 1,500 mg (02/13/2019)  for chemotherapy treatment.       CANCER STAGING: Cancer Staging No matching staging information was found for the patient.   INTERVAL HISTORY:  Mr. Molinelli 73 y.o. male seen for follow-up of colitis and small cell lung cancer.  Durvalumab continues to be on hold.  His prednisone was tapered off around 04/27/2019.  He denied any diarrhea or bleeding per rectum.  No dry cough reported.  No skin rashes.  Decreased appetite reported.  Lost about 1 pound since last visit 2 weeks ago.  Energy levels are 50%.  He continues to workout at the gym 4 to 6 days a week.  Easy bruising noted which is stable.  REVIEW OF SYSTEMS:  Review of Systems  Hematological: Bruises/bleeds easily.  All other systems reviewed and are negative.    PAST MEDICAL/SURGICAL HISTORY:  Past Medical History:  Diagnosis Date  . Actinic keratosis   . Arthritis   . Colitis MAY 2012    CT ABD/PELVIS HEP FLEXURE  . COPD (chronic obstructive pulmonary disease) ([place])   . Diabetes mellitus without complication ([place])   . History of kidney stones   . Hyperlipemia   . Hypertension   . NSAID long-term use NAPROXEN FOR OA  . SCL Ca dx'd 08/2018   Past Surgical History:  Procedure Laterality Date  . BACK SURGERY     spinal stenosis  . BASAL CELL CARCINOMA EXCISION  FACE, arms feet, leg  . CHOLECYSTECTOMY  JUNE 2011 MJ   STONES, PANCREATITIS  .  KNEE ARTHROSCOPY WITH MEDIAL MENISECTOMY Right 03/24/2015   Procedure: KNEE ARTHROSCOPY WITH MEDIAL MENISECTOMY;  Surgeon: Carole Civil, MD;  Location: [facility];  Service: Orthopedics;  Laterality: Right;  . KNEE SURGERY Left LEFT   arthroscopy  . LITHOTRIPSY  80s  . PORTACATH PLACEMENT Left 09/11/2018   Procedure: INSERTION PORT-A-CATH;  Surgeon: Virl Cagey, MD;  Location: [facility];  Service: General;  Laterality: Left;  . SPINE SURGERY       SOCIAL HISTORY:  Social History   Socioeconomic History  . Marital status: Divorced    Spouse name: Not on file  . Number of children: 0  . Years of education: Not on file  . Highest education level: Not on file  Occupational History  . Occupation: [place] [employer]   . Occupation: Manufacturing engineer   Social Needs  . Financial resource strain: Not hard at all  . Food insecurity    Worry: Never true    Inability: Never true  . Transportation needs    Medical: No    Non-medical: No  Tobacco Use  . Smoking status: Former Smoker    Packs/day: 1.00    Years: 50.00  Pack years: 50.00    Types: Cigarettes    Quit date: 08/07/2018    Years since quitting: 0.7  . Smokeless tobacco: Never Used  Substance and Sexual Activity  . Alcohol use: Yes    Comment: once a month  . Drug use: No  . Sexual activity: Never    Birth control/protection: None  Lifestyle  . Physical activity    Days per week: 0 days    Minutes per session: 0 min  . Stress: Not at all  Relationships  . Social Herbalist on phone: Never    Gets together: More than three times a week    Attends religious service: Never    Active member of club or organization: Yes    Attends meetings of clubs or organizations: More than 4 times per year    Relationship status: Divorced  . Intimate partner violence    Fear of current or ex partner: No    Emotionally abused: No    Physically abused: No    Forced sexual activity: No  Other Topics Concern  . 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    FAMILY HISTORY:  Family History  Problem Relation Age of Onset  . Cancer Mother        lung  . Cancer Father        lung and liver  . Colon polyps Neg Hx   . Colon cancer Neg Hx     CURRENT MEDICATIONS:  Outpatient Encounter Medications as of 05/07/2019  Medication Sig Note  . aspirin 81 MG tablet Take 81 mg by mouth daily.     . Cholecalciferol 50 MCG (2000 UT) TBDP Take 1 tablet by mouth daily.   Marland Kitchen docusate sodium (COLACE) 100 MG capsule Take 100 mg by mouth 2 (two) times daily.   [place] (IMFINZI IV) Inject into the vein every 21 ( twenty-one) days. 03/24/2019: Patient states that he was scheduled for injection last week but it was cancelled. Patient is due for injection for 03/2019  . fluorouracil (EFUDEX) 5 % cream APP AA OF SKIN ON BOTH ARMS BID FOR 3 WEEKS   . metFORMIN (GLUCOPHAGE) 500 MG tablet Take 500 mg by mouth 2 (two) times daily with a meal.   . mirtazapine (REMERON) 15 MG tablet Take 1 tablet (15 mg total) by mouth at bedtime.   . Multiple Vitamin (MULTIVITAMIN) tablet Take 1 tablet by mouth daily.   . rosuvastatin (CRESTOR) 10 MG tablet Take 10 mg by mouth daily.     . verapamil (COVERA HS) 240 MG (CO) 24 hr tablet Take 120 mg by mouth at bedtime.    Marland Kitchen albuterol (PROVENTIL HFA;VENTOLIN HFA) 108 (90 Base) MCG/ACT inhaler Inhale 2 puffs into the lungs every 6 (six) hours as needed for wheezing or shortness of breath.    . predniSONE (DELTASONE) 20 MG tablet Take 3 tablets (60 mg total) by mouth daily with breakfast. (Patient not taking: Reported on 05/07/2019) 03/24/2019: 2 tabs remaining in course   No facility-administered encounter medications on file as of 05/07/2019.     ALLERGIES:  No Known Allergies   PHYSICAL EXAM:  ECOG Performance status: 1  Vitals:   05/07/19 1120  BP: 117/86  Pulse: 85  Resp: 18  Temp: 98.3 F  (36.8 C)  SpO2: 98%   Filed Weights  05/07/19 1120  Weight: 159 lb 12.8 oz (72.5 kg)    Physical Exam Vitals signs reviewed.  Constitutional:      Appearance: Normal appearance.  Cardiovascular:     Rate and Rhythm: Normal rate and regular rhythm.     Heart sounds: Normal heart sounds.  Pulmonary:     Effort: Pulmonary effort is normal.     Breath sounds: Normal breath sounds.  Abdominal:     General: There is no distension.     Palpations: Abdomen is soft. There is no mass.  Musculoskeletal:        General: No swelling.  Lymphadenopathy:     Cervical: No cervical adenopathy.  Skin:    General: Skin is warm.  Neurological:     General: No focal deficit present.     Mental Status: He is alert and oriented to person, place, and time.  Psychiatric:        Mood and Affect: Mood normal.        Behavior: Behavior normal.      LABORATORY DATA:  I have reviewed the labs as listed.  CBC    Component Value Date/Time   WBC 7.1 04/22/2019 0903   RBC 3.73 (L) 04/22/2019 0903   HGB 12.1 (L) 04/22/2019 0903   HCT 36.7 (L) 04/22/2019 0903   PLT 272 04/22/2019 0903   MCV 98.4 04/22/2019 0903   MCH 32.4 04/22/2019 0903   MCHC 33.0 04/22/2019 0903   RDW 15.0 04/22/2019 0903   LYMPHSABS 0.9 04/22/2019 0903   MONOABS 0.6 04/22/2019 0903   EOSABS 0.1 04/22/2019 0903   BASOSABS 0.0 04/22/2019 0903   CMP Latest Ref Rng & Units 04/22/2019 04/01/2019 03/24/2019  Glucose 70 - 99 mg/dL 170(H) 171(H) 121(H)  BUN 8 - 23 mg/dL 16 19 21   Creatinine 0.61 - 1.24 mg/dL 0.83 0.71 0.69  Sodium 135 - 145 mmol/L 139 137 136  Potassium 3.5 - 5.1 mmol/L 4.5 4.5 4.3  Chloride 98 - 111 mmol/L 102 101 97(L)  CO2 22 - 32 mmol/L 26 27 27   Calcium 8.9 - 10.3 mg/dL 9.7 9.3 9.1  Total Protein 6.5 - 8.1 g/dL 6.4(L) 6.1(L) 6.0(L)  Total Bilirubin 0.3 - 1.2 mg/dL 0.8 0.6 0.6  Alkaline Phos 38 - 126 U/L 86 62 63  AST 15 - 41 U/L 23 18 18   ALT 0 - 44 U/L 14 16 19        DIAGNOSTIC IMAGING:  I  have independently reviewed the scans and discussed with the patient.      ASSESSMENT & PLAN:   Small cell carcinoma of lung, right ([place]) 1.  Extensive stage small cell lung cancer: -4 cycles of carboplatin, VP-16 and durvalumab from 08/23/2018 through 10/28/2018. -CT CAP on 01/14/2019 showed continued further decrease in mediastinal, hilar and axillary adenopathy.  Tiny pericardial effusion and small right pleural effusion. -Last durvalumab on 02/13/2019, held due to colitis. -Denies any chest pains or cough.  I have recommended repeating CT CAP prior to next visit in 2 weeks.  2.  Colitis: -He experienced diarrhea with blood and was started on prednisone 60 mg daily on 03/13/2019. -We were tapering on a weekly basis as his diarrhea and bleeding improved. - Prednisone tapered off around 04/27/2019. - Denies any diarrhea or bleeding per rectum. -He lost about 1 pound since last visit.   3.  Brain mets disease: - He had 3 small subcentimeter metastasis in the right insula, right occipital lobe and right cerebellar hemisphere, treated with  whole brain RT from 09/02/2018 through 09/13/2018. -MRI of the brain on 03/20/2019 showed continued resolution of the 3 small brain metastasis.  Small 5 mm area of subcortical white matter diffusion abnormality in the right frontal lobe without enlargement is probably sequela of XRT.  Total time spent is 25 minutes with more than 50% of the time spent face-to-face discussing treatment plan, counseling and coordination of care.    Orders placed this encounter:  Orders Placed This Encounter  Procedures  . CT Abdomen Pelvis W Contrast  . CT Chest W Contrast  . CBC with Differential/Platelet  . Comprehensive metabolic panel      Derek Jack, MD [place] [phone]

## 2019-05-22 ENCOUNTER — Inpatient Hospital Stay (HOSPITAL_COMMUNITY): Payer: Medicare Other | Attending: Hematology

## 2019-05-22 ENCOUNTER — Other Ambulatory Visit: Payer: Self-pay

## 2019-05-22 ENCOUNTER — Ambulatory Visit (HOSPITAL_COMMUNITY)
Admission: RE | Admit: 2019-05-22 | Discharge: 2019-05-22 | Disposition: A | Discharge status: Home / Self Care | Payer: Medicare Other | Source: Ambulatory Visit | Attending: Hematology | Admitting: Hematology

## 2019-05-22 DIAGNOSIS — Z23 Encounter for immunization: Secondary | ICD-10-CM | POA: Diagnosis not present

## 2019-05-22 DIAGNOSIS — C3491 Malignant neoplasm of unspecified part of right bronchus or lung: Secondary | ICD-10-CM

## 2019-05-22 DIAGNOSIS — C3490 Malignant neoplasm of unspecified part of unspecified bronchus or lung: Secondary | ICD-10-CM | POA: Insufficient documentation

## 2019-05-22 LAB — CBC WITH DIFFERENTIAL/PLATELET
Abs Immature Granulocytes: 0.05 10*3/uL (ref 0.00–0.07)
Basophils Absolute: 0 10*3/uL (ref 0.0–0.1)
Basophils Relative: 1 %
Eosinophils Absolute: 0.2 10*3/uL (ref 0.0–0.5)
Eosinophils Relative: 2 %
HCT: 39 % (ref 39.0–52.0)
Hemoglobin: 12.3 g/dL — ABNORMAL LOW (ref 13.0–17.0)
Immature Granulocytes: 1 %
Lymphocytes Relative: 17 %
Lymphs Abs: 1.4 10*3/uL (ref 0.7–4.0)
MCH: 31 pg (ref 26.0–34.0)
MCHC: 31.5 g/dL (ref 30.0–36.0)
MCV: 98.2 fL (ref 80.0–100.0)
Monocytes Absolute: 0.6 10*3/uL (ref 0.1–1.0)
Monocytes Relative: 8 %
Neutro Abs: 5.7 10*3/uL (ref 1.7–7.7)
Neutrophils Relative %: 71 %
Platelets: 229 10*3/uL (ref 150–400)
RBC: 3.97 MIL/uL — ABNORMAL LOW (ref 4.22–5.81)
RDW: 16.9 % — ABNORMAL HIGH (ref 11.5–15.5)
WBC: 7.9 10*3/uL (ref 4.0–10.5)
nRBC: 0 % (ref 0.0–0.2)

## 2019-05-22 LAB — COMPREHENSIVE METABOLIC PANEL
ALT: 10 U/L (ref 0–44)
AST: 14 U/L — ABNORMAL LOW (ref 15–41)
Albumin: 3.3 g/dL — ABNORMAL LOW (ref 3.5–5.0)
Alkaline Phosphatase: 97 U/L (ref 38–126)
Anion gap: 8 (ref 5–15)
BUN: 12 mg/dL (ref 8–23)
CO2: 29 mmol/L (ref 22–32)
Calcium: 9.4 mg/dL (ref 8.9–10.3)
Chloride: 102 mmol/L (ref 98–111)
Creatinine, Ser: 0.8 mg/dL (ref 0.61–1.24)
GFR calc Af Amer: >60 mL/min (ref >60)
GFR calc non Af Amer: >60 mL/min (ref >60)
Glucose, Bld: 94 mg/dL (ref 70–99)
Potassium: 5 mmol/L (ref 3.5–5.1)
Sodium: 139 mmol/L (ref 135–145)
Total Bilirubin: 0.3 mg/dL (ref 0.3–1.2)
Total Protein: 6.8 g/dL (ref 6.5–8.1)

## 2019-05-22 MED ORDER — IOHEXOL 300 MG/ML  SOLN
100.0000 mL | Freq: Once | INTRAMUSCULAR | Status: AC | PRN
  Administered 2019-05-22: 100 mL via INTRAVENOUS

## 2019-05-29 ENCOUNTER — Encounter (HOSPITAL_COMMUNITY): Payer: Self-pay | Admitting: Hematology

## 2019-05-29 ENCOUNTER — Other Ambulatory Visit: Payer: Self-pay | Admitting: Radiation Therapy

## 2019-05-29 ENCOUNTER — Inpatient Hospital Stay (HOSPITAL_BASED_OUTPATIENT_CLINIC_OR_DEPARTMENT_OTHER): Payer: Medicare Other | Admitting: Hematology

## 2019-05-29 ENCOUNTER — Other Ambulatory Visit: Payer: Self-pay

## 2019-05-29 VITALS — BP 111/80 | HR 82 | Temp 98.9°F | Resp 20 | Wt 167.3 lb

## 2019-05-29 DIAGNOSIS — C3490 Malignant neoplasm of unspecified part of unspecified bronchus or lung: Secondary | ICD-10-CM | POA: Diagnosis not present

## 2019-05-29 DIAGNOSIS — C3491 Malignant neoplasm of unspecified part of right bronchus or lung: Secondary | ICD-10-CM

## 2019-05-29 DIAGNOSIS — C7949 Secondary malignant neoplasm of other parts of nervous system: Secondary | ICD-10-CM

## 2019-05-29 DIAGNOSIS — C7931 Secondary malignant neoplasm of brain: Secondary | ICD-10-CM

## 2019-05-29 MED ORDER — INFLUENZA VAC A&B SA ADJ QUAD 0.5 ML IM PRSY
0.5000 mL | PREFILLED_SYRINGE | Freq: Once | INTRAMUSCULAR | Status: AC
  Administered 2019-05-29: 16:00:00 0.5 mL via INTRAMUSCULAR
  Filled 2019-05-29: qty 0.5

## 2019-05-29 NOTE — Patient Instructions (Addendum)
[place] at [facility] Discharge Instructions  You were seen today by Dr. Delton Coombes. He went over your recent lab results. He will see you back in 3 months for labs, scan and follow up.   Thank you for choosing [place] at [facility] to provide your oncology and hematology care.  To afford each patient quality time with our provider, please arrive at least 15 minutes before your scheduled appointment time.   If you have a lab appointment with the [facility] please come in thru the  Main Entrance and check in at the main information desk  You need to re-schedule your appointment should you arrive 10 or more minutes late.  We strive to give you quality time with our providers, and arriving late affects you and other patients whose appointments are after yours.  Also, if you no show three or more times for appointments you may be dismissed from the clinic at the providers discretion.     Again, thank you for choosing [facility].  Our hope is that these requests will decrease the amount of time that you wait before being seen by our physicians.       _____________________________________________________________  Should you have questions after your visit to [facility], please contact our office at [phone] between the hours of 8:00 a.m. and 4:30 p.m.  Voicemails left after 4:00 p.m. will not be returned until the following business day.  For prescription refill requests, have your pharmacy contact our office and allow 72 hours.    Cancer Center Support Programs:   > Cancer Support Group  2nd Tuesday of the month 1pm-2pm, Journey Room

## 2019-05-29 NOTE — Progress Notes (Signed)
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[place])  08/22/2018 Initial Diagnosis   Small cell carcinoma of lung, right ([place])   08/23/2018 - 11/17/2018 Chemotherapy   The patient had palonosetron (ALOXI) injection 0.25 mg, 0.25 mg, Intravenous,  Once, 4 of 4 cycles Administration: 0.25 mg (08/23/2018), 0.25 mg (09/16/2018), 0.25 mg (10/07/2018), 0.25 mg (10/28/2018) pegfilgrastim-cbqv (UDENYCA) injection 6 mg, 6 mg, Subcutaneous, Once, 4 of 4 cycles Administration: 6 mg (08/27/2018), 6 mg (09/20/2018), 6 mg (10/11/2018), 6 mg (11/01/2018) CARBOplatin (PARAPLATIN) 560 mg in sodium chloride 0.9 % 250 mL chemo infusion, 560 mg (100 % of original dose 564 mg), Intravenous,  Once, 4 of 4 cycles Dose modification:   (original dose 564 mg, Cycle 1),   (original dose 523.5 mg, Cycle 3),   (original dose 523.5 mg, Cycle 4) Administration: 560 mg (08/23/2018), 560 mg (09/16/2018), 520 mg (10/07/2018), 510 mg (10/28/2018) etoposide (VEPESID) 220 mg in sodium chloride 0.9 % 1,000 mL chemo infusion, 100 mg/m2 = 220 mg, Intravenous,  Once, 4 of 4 cycles Administration: 220 mg (08/23/2018), 200 mg (09/16/2018), 200 mg (09/17/2018), 200 mg (09/18/2018), 200 mg (10/07/2018), 200 mg (10/08/2018), 200 mg (10/09/2018), 200 mg (10/28/2018), 200 mg (10/29/2018), 200 mg (10/30/2018) fosaprepitant (EMEND) 150 mg, dexamethasone (DECADRON) 12 mg in sodium chloride 0.9 % 145 mL IVPB, , Intravenous,  Once, 4 of 4 cycles Administration:  (08/23/2018),  (09/16/2018),  (10/07/2018),  (10/28/2018) durvalumab (IMFINZI) 1,500 mg in sodium chloride 0.9  % 100 mL chemo infusion, 1,500 mg (100 % of original dose 1,500 mg), Intravenous,  Once, 3 of 3 cycles Dose modification: 1,500 mg (original dose 1,500 mg, Cycle 2, Reason: Provider Judgment) Administration: 1,500 mg (09/16/2018), 1,500 mg (10/07/2018), 1,500 mg (10/28/2018)  for chemotherapy treatment.    Small cell carcinoma of lung, right ([place])  08/23/2018 - 11/17/2018 Chemotherapy   The patient had palonosetron (ALOXI) injection 0.25 mg, 0.25 mg, Intravenous,  Once, 4 of 4 cycles Administration: 0.25 mg (08/23/2018), 0.25 mg (09/16/2018), 0.25 mg (10/07/2018), 0.25 mg (10/28/2018) pegfilgrastim-cbqv (UDENYCA) injection 6 mg, 6 mg, Subcutaneous, Once, 4 of 4 cycles Administration: 6 mg (08/27/2018), 6 mg (09/20/2018), 6 mg (10/11/2018), 6 mg (11/01/2018) CARBOplatin (PARAPLATIN) 560 mg in sodium chloride 0.9 % 250 mL chemo infusion, 560 mg (100 % of original dose 564 mg), Intravenous,  Once, 4 of 4 cycles Dose modification:   (original dose 564 mg, Cycle 1),   (original dose 523.5 mg, Cycle 3),   (original dose 523.5 mg, Cycle 4) Administration: 560 mg (08/23/2018), 560 mg (09/16/2018), 520 mg (10/07/2018), 510 mg (10/28/2018) etoposide (VEPESID) 220 mg in sodium chloride 0.9 % 1,000 mL chemo infusion, 100 mg/m2 = 220 mg, Intravenous,  Once, 4 of 4 cycles Administration: 220 mg (08/23/2018), 200 mg (09/16/2018), 200 mg (09/17/2018), 200 mg (09/18/2018), 200 mg (10/07/2018), 200 mg (10/08/2018), 200 mg (10/09/2018), 200 mg (10/28/2018), 200 mg (10/29/2018), 200 mg (10/30/2018) fosaprepitant (EMEND) 150 mg, dexamethasone (DECADRON) 12 mg in sodium chloride 0.9 % 145 mL IVPB, , Intravenous,  Once, 4 of 4 cycles Administration:  (08/23/2018),  (09/16/2018),  (10/07/2018),  (10/28/2018) durvalumab (IMFINZI) 1,500 mg  in sodium chloride 0.9 % 100 mL chemo infusion, 1,500 mg (100 % of original dose 1,500 mg), Intravenous,  Once, 3 of 3 cycles Dose modification: 1,500 mg (original dose 1,500 mg, Cycle 2, Reason: Provider Judgment)  Administration: 1,500 mg (09/16/2018), 1,500 mg (10/07/2018), 1,500 mg (10/28/2018)  for chemotherapy treatment.    08/26/2018 Initial Diagnosis   Small cell carcinoma of lung, right ([place])   11/20/2018 -  Chemotherapy   The patient had durvalumab (IMFINZI) 1,500 mg in sodium chloride 0.9 % 100 mL chemo infusion, 1,500 mg (100 % of original dose 1,500 mg), Intravenous,  Once, 4 of 9 cycles Dose modification: 1,500 mg (original dose 1,500 mg, Cycle 1, Reason: Other (see comments)) Administration: 1,500 mg (11/20/2018), 1,500 mg (12/19/2018), 1,500 mg (01/16/2019), 1,500 mg (02/13/2019)  for chemotherapy treatment.       CANCER STAGING: Cancer Staging No matching staging information was found for the patient.   INTERVAL HISTORY:  [name] 73 y.o. male seen for follow-up of colitis and small cell lung cancer.  Durvalumab is on hold.  Last treatment was on 02/13/2019.  He denies any diarrhea or bleeding per rectum.  Appetite is 75%.  Energy levels are 50%.  He is more or less maintaining his weight.  Occasional dizziness when walking.  Occasional sleep problems reported.  He is also working out in the [employer] 5 to 7 days a week.  REVIEW OF SYSTEMS:  Review of Systems  Neurological: Positive for dizziness.  Psychiatric/Behavioral: Positive for sleep disturbance.  All other systems reviewed and are negative.    PAST MEDICAL/SURGICAL HISTORY:  Past Medical History:  Diagnosis Date  . Actinic keratosis   . Arthritis   . Colitis MAY 2012    CT ABD/PELVIS HEP FLEXURE  . COPD (chronic obstructive pulmonary disease) ([place])   . Diabetes mellitus without complication ([place])   . History of kidney stones   . Hyperlipemia   . Hypertension   . NSAID long-term use NAPROXEN FOR OA  . SCL Ca dx'd 08/2018   Past Surgical History:  Procedure Laterality Date  . BACK SURGERY     spinal stenosis  . BASAL CELL CARCINOMA EXCISION  FACE, arms feet, leg  . CHOLECYSTECTOMY  JUNE 2011 MJ   STONES,  PANCREATITIS  . KNEE ARTHROSCOPY WITH MEDIAL MENISECTOMY Right 03/24/2015   Procedure: KNEE ARTHROSCOPY WITH MEDIAL MENISECTOMY;  Surgeon: Carole Civil, MD;  Location: [facility];  Service: Orthopedics;  Laterality: Right;  . KNEE SURGERY Left LEFT   arthroscopy  . LITHOTRIPSY  80s  . PORTACATH PLACEMENT Left 09/11/2018   Procedure: INSERTION PORT-A-CATH;  Surgeon: Virl Cagey, MD;  Location: [facility];  Service: General;  Laterality: Left;  . SPINE SURGERY       SOCIAL HISTORY:  Social History   Socioeconomic History  . Marital status: Divorced    Spouse name: Not on file  . Number of children: 0  . Years of education: Not on file  . Highest education level: Not on file  Occupational History  . Occupation: [employer]   . Occupation: Manufacturing engineer   Social Needs  . Financial resource strain: Not hard at all  . Food insecurity    Worry: Never true    Inability: Never true  . Transportation needs    Medical: No    Non-medical: No  Tobacco Use  . Smoking status: Former Smoker    Packs/day: 1.00    Years: 50.00    Pack years:  50.00    Types: Cigarettes    Quit date: 08/07/2018    Years since quitting: 0.8  . Smokeless tobacco: Never Used  Substance and Sexual Activity  . Alcohol use: Yes    Comment: once a month  . Drug use: No  . Sexual activity: Never    Birth control/protection: None  Lifestyle  . Physical activity    Days per week: 0 days    Minutes per session: 0 min  . Stress: Not at all  Relationships  . Social Herbalist on phone: Never    Gets together: More than three times a week    Attends religious service: Never    Active member of club or organization: Yes    Attends meetings of clubs or organizations: More than 4 times per year    Relationship status: Divorced  . Intimate partner violence    Fear of current or ex partner: No    Emotionally abused: No    Physically abused: No    Forced sexual activity: No  Other Topics Concern   . 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    FAMILY HISTORY:  Family History  Problem Relation Age of Onset  . Cancer Mother        lung  . Cancer Father        lung and liver  . Colon polyps Neg Hx   . Colon cancer Neg Hx     CURRENT MEDICATIONS:  Outpatient Encounter Medications as of 05/29/2019  Medication Sig Note  . albuterol (PROVENTIL HFA;VENTOLIN HFA) 108 (90 Base) MCG/ACT inhaler Inhale 2 puffs into the lungs every 6 (six) hours as needed for wheezing or shortness of breath.    Marland Kitchen aspirin 81 MG tablet Take 81 mg by mouth daily.     . Cholecalciferol 50 MCG (2000 UT) TBDP Take 1 tablet by mouth daily.   Marland Kitchen docusate sodium (COLACE) 100 MG capsule Take 100 mg by mouth 2 (two) times daily.   Hunt Oris (IMFINZI IV) Inject into the vein every 21 ( twenty-one) days. 03/24/2019: Patient states that he was scheduled for injection last week but it was cancelled. Patient is due for injection for 03/2019  . fluorouracil (EFUDEX) 5 % cream APP AA OF SKIN ON BOTH ARMS BID FOR 3 WEEKS   . metFORMIN (GLUCOPHAGE) 500 MG tablet Take 500 mg by mouth 2 (two) times daily with a meal.   . mirtazapine (REMERON) 15 MG tablet Take 1 tablet (15 mg total) by mouth at bedtime.   . Multiple Vitamin (MULTIVITAMIN) tablet Take 1 tablet by mouth daily.   . predniSONE (DELTASONE) 20 MG tablet Take 3 tablets (60 mg total) by mouth daily with breakfast. (Patient not taking: Reported on 05/07/2019) 03/24/2019: 2 tabs remaining in course  . rosuvastatin (CRESTOR) 10 MG tablet Take 10 mg by mouth daily.     . verapamil (COVERA HS) 240 MG (CO) 24 hr tablet Take 120 mg by mouth at bedtime.    . [EXPIRED] influenza vaccine adjuvanted (FLUAD) injection 0.5 mL     No facility-administered encounter medications on file as of 05/29/2019.     ALLERGIES:  No Known Allergies   PHYSICAL EXAM:  ECOG Performance  status: 1  Vitals:   05/29/19 1515  BP: 111/80  Pulse: 82  Resp: 20  Temp: 98.9 F (  37.2 C)  SpO2: 98%   Filed Weights   05/29/19 1515  Weight: 167 lb 4.8 oz (75.9 kg)    Physical Exam Vitals signs reviewed.  Constitutional:      Appearance: Normal appearance.  Cardiovascular:     Rate and Rhythm: Normal rate and regular rhythm.     Heart sounds: Normal heart sounds.  Pulmonary:     Effort: Pulmonary effort is normal.     Breath sounds: Normal breath sounds.  Abdominal:     General: There is no distension.     Palpations: Abdomen is soft. There is no mass.  Musculoskeletal:        General: No swelling.  Lymphadenopathy:     Cervical: No cervical adenopathy.  Skin:    General: Skin is warm.  Neurological:     General: No focal deficit present.     Mental Status: He is alert and oriented to person, place, and time.  Psychiatric:        Mood and Affect: Mood normal.        Behavior: Behavior normal.      LABORATORY DATA:  I have reviewed the labs as listed.  CBC    Component Value Date/Time   WBC 7.9 05/22/2019 0839   RBC 3.97 (L) 05/22/2019 0839   HGB 12.3 (L) 05/22/2019 0839   HCT 39.0 05/22/2019 0839   PLT 229 05/22/2019 0839   MCV 98.2 05/22/2019 0839   MCH 31.0 05/22/2019 0839   MCHC 31.5 05/22/2019 0839   RDW 16.9 (H) 05/22/2019 0839   LYMPHSABS 1.4 05/22/2019 0839   MONOABS 0.6 05/22/2019 0839   EOSABS 0.2 05/22/2019 0839   BASOSABS 0.0 05/22/2019 0839   CMP Latest Ref Rng & Units 05/22/2019 04/22/2019 04/01/2019  Glucose 70 - 99 mg/dL 94 170(H) 171(H)  BUN 8 - 23 mg/dL 12 16 19   Creatinine 0.61 - 1.24 mg/dL 0.80 0.83 0.71  Sodium 135 - 145 mmol/L 139 139 137  Potassium 3.5 - 5.1 mmol/L 5.0 4.5 4.5  Chloride 98 - 111 mmol/L 102 102 101  CO2 22 - 32 mmol/L 29 26 27   Calcium 8.9 - 10.3 mg/dL 9.4 9.7 9.3  Total Protein 6.5 - 8.1 g/dL 6.8 6.4(L) 6.1(L)  Total Bilirubin 0.3 - 1.2 mg/dL 0.3 0.8 0.6  Alkaline Phos 38 - 126 U/L 97 86 62  AST 15 -  41 U/L 14(L) 23 18  ALT 0 - 44 U/L 10 14 16        DIAGNOSTIC IMAGING:  I have independently reviewed the scans and discussed with the patient.      ASSESSMENT & PLAN:   Small cell carcinoma of lung, right ([place]) 1.  Extensive stage small cell lung cancer: -4 cycles of carboplatin, VP-16 and durvalumab from 08/23/2018 through 10/28/2018. -CT CAP on 01/14/2019 showed continued further decrease in mediastinal, hilar and axillary adenopathy.  Tiny pericardial effusion and small right pleural effusion. -Last durvalumab on 02/13/2019, held due to colitis. -Denies any chest pains or cough or hemoptysis. -We reviewed results of CT CAP from 05/22/2019 which showed continued decrease in size of the mediastinal lymph nodes.  No mediastinal or hilar adenopathy.  No specific findings to suggest progressive disease.  Radiation changes were seen. -As he had good response, I did not recommend reinstituting immunotherapy at this time.  I plan to repeat his scan in 3 months.  2.  Colitis: -He experienced diarrhea with blood and was started on prednisone 60 mg daily on 03/13/2019. -We  were tapering on a weekly basis as his diarrhea and bleeding improved. - Prednisone tapered off around 04/27/2019. -Denies any further diarrhea or bleeding per rectum.   3.  Brain mets disease: - He had 3 small subcentimeter metastasis in the right insula, right occipital lobe and right cerebellar hemisphere, treated with whole brain RT from 09/02/2018 through 09/13/2018. -MRI of the brain on 03/20/2019 showed continued resolution of the 3 small brain metastasis.  Small 5 mm area of subcortical white matter diffusion abnormality in the right frontal lobe without enlargement is probably sequela of XRT.  Total time spent is 25 minutes with more than 50% of the time spent face-to-face discussing treatment plan, counseling and coordination of care.    Orders placed this encounter:  Orders Placed This Encounter  Procedures  . CT  Abdomen Pelvis W Contrast  . CT Chest W Contrast      Derek Jack, MD [place] [phone]

## 2019-06-02 ENCOUNTER — Telehealth: Payer: Self-pay | Admitting: Radiation Therapy

## 2019-06-02 ENCOUNTER — Other Ambulatory Visit: Payer: Self-pay | Admitting: Radiation Therapy

## 2019-06-02 NOTE — Telephone Encounter (Signed)
Spoke with [name] about his upcoming brain MRI and follow-up with Bryson Ha. He does not have technology to do a virtual or mychart visit and has asked that the results be shared via telephone only.   Mont Dutton R.T.(R)(T) Radiation Special Procedures Navigator

## 2019-06-06 ENCOUNTER — Encounter (HOSPITAL_COMMUNITY): Payer: Self-pay | Admitting: Hematology

## 2019-06-06 NOTE — Assessment & Plan Note (Signed)
1.  Extensive stage small cell lung cancer: -4 cycles of carboplatin, VP-16 and durvalumab from 08/23/2018 through 10/28/2018. -CT CAP on 01/14/2019 showed continued further decrease in mediastinal, hilar and axillary adenopathy.  Tiny pericardial effusion and small right pleural effusion. -Last durvalumab on 02/13/2019, held due to colitis. -Denies any chest pains or cough or hemoptysis. -We reviewed results of CT CAP from 05/22/2019 which showed continued decrease in size of the mediastinal lymph nodes.  No mediastinal or hilar adenopathy.  No specific findings to suggest progressive disease.  Radiation changes were seen. -As he had good response, I did not recommend reinstituting immunotherapy at this time.  I plan to repeat his scan in 3 months.  2.  Colitis: -He experienced diarrhea with blood and was started on prednisone 60 mg daily on 03/13/2019. -We were tapering on a weekly basis as his diarrhea and bleeding improved. - Prednisone tapered off around 04/27/2019. -Denies any further diarrhea or bleeding per rectum.   3.  Brain mets disease: - He had 3 small subcentimeter metastasis in the right insula, right occipital lobe and right cerebellar hemisphere, treated with whole brain RT from 09/02/2018 through 09/13/2018. -MRI of the brain on 03/20/2019 showed continued resolution of the 3 small brain metastasis.  Small 5 mm area of subcortical white matter diffusion abnormality in the right frontal lobe without enlargement is probably sequela of XRT.

## 2019-06-09 ENCOUNTER — Other Ambulatory Visit (HOSPITAL_COMMUNITY): Payer: Self-pay | Admitting: Family Medicine

## 2019-06-09 ENCOUNTER — Other Ambulatory Visit: Payer: Self-pay

## 2019-06-09 ENCOUNTER — Ambulatory Visit (HOSPITAL_COMMUNITY)
Admission: RE | Admit: 2019-06-09 | Discharge: 2019-06-09 | Disposition: A | Discharge status: Home / Self Care | Payer: Medicare Other | Source: Ambulatory Visit | Attending: Family Medicine | Admitting: Family Medicine

## 2019-06-09 DIAGNOSIS — M5441 Lumbago with sciatica, right side: Secondary | ICD-10-CM | POA: Insufficient documentation

## 2019-06-19 ENCOUNTER — Other Ambulatory Visit: Payer: Self-pay

## 2019-06-19 ENCOUNTER — Ambulatory Visit
Admission: RE | Admit: 2019-06-19 | Discharge: 2019-06-19 | Disposition: A | Discharge status: Home / Self Care | Payer: Medicare Other | Source: Ambulatory Visit | Attending: Radiation Oncology | Admitting: Radiation Oncology

## 2019-06-19 DIAGNOSIS — C7931 Secondary malignant neoplasm of brain: Secondary | ICD-10-CM

## 2019-06-19 MED ORDER — GADOBENATE DIMEGLUMINE 529 MG/ML IV SOLN
20.0000 mL | Freq: Once | INTRAVENOUS | Status: AC | PRN
  Administered 2019-06-19: 09:00:00 20 mL via INTRAVENOUS

## 2019-06-23 ENCOUNTER — Inpatient Hospital Stay: Payer: Medicare Other | Attending: Radiation Oncology

## 2019-06-24 ENCOUNTER — Telehealth: Payer: Self-pay | Admitting: Radiation Oncology

## 2019-06-24 NOTE — Telephone Encounter (Signed)
I called the patient to let him know his MRI results but had to leave a message.

## 2019-08-06 ENCOUNTER — Other Ambulatory Visit: Payer: Self-pay | Admitting: *Deleted

## 2019-08-06 DIAGNOSIS — C7931 Secondary malignant neoplasm of brain: Secondary | ICD-10-CM

## 2019-08-12 IMAGING — CR DG CHEST 1V PORT
2 series · 2 of 2 positions shown · non-contrast
Comparison: Chest CT and chest x-ray dated 08/21/2018

CLINICAL DATA: Port-A-Cath insertion.

EXAM:
PORTABLE CHEST 1 VIEW

[ap portable (1 of 2)]
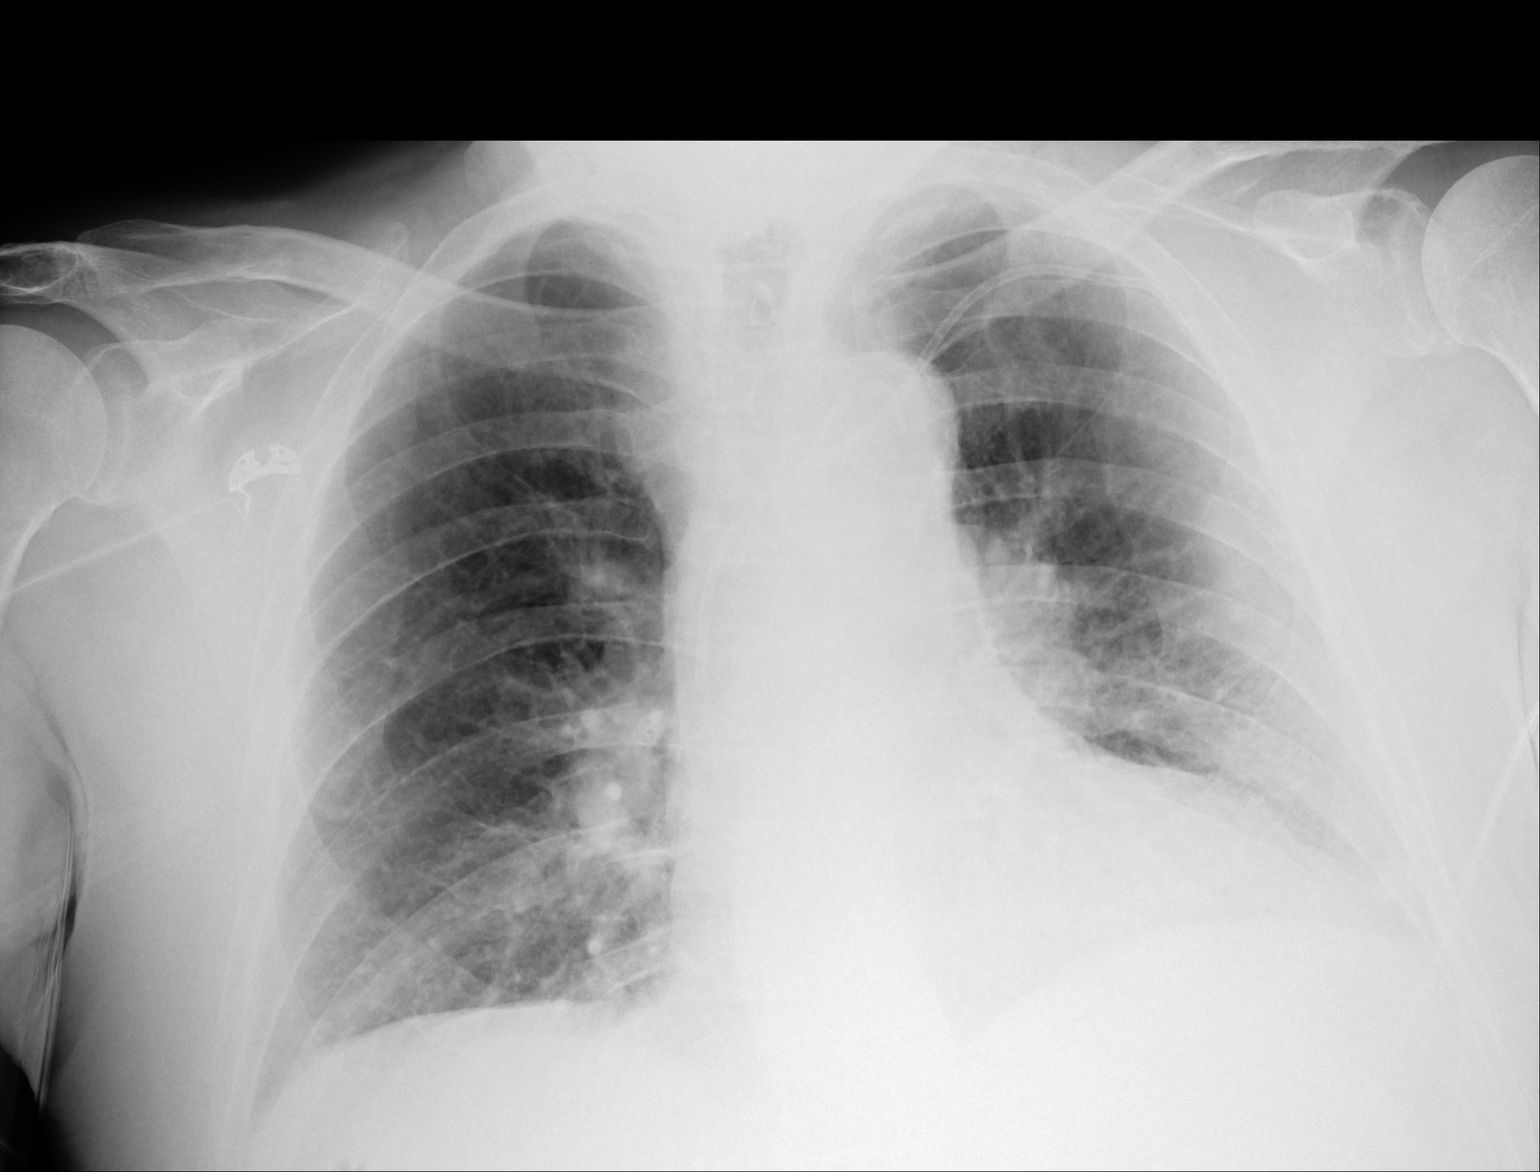

[ap portable (2 of 2)]
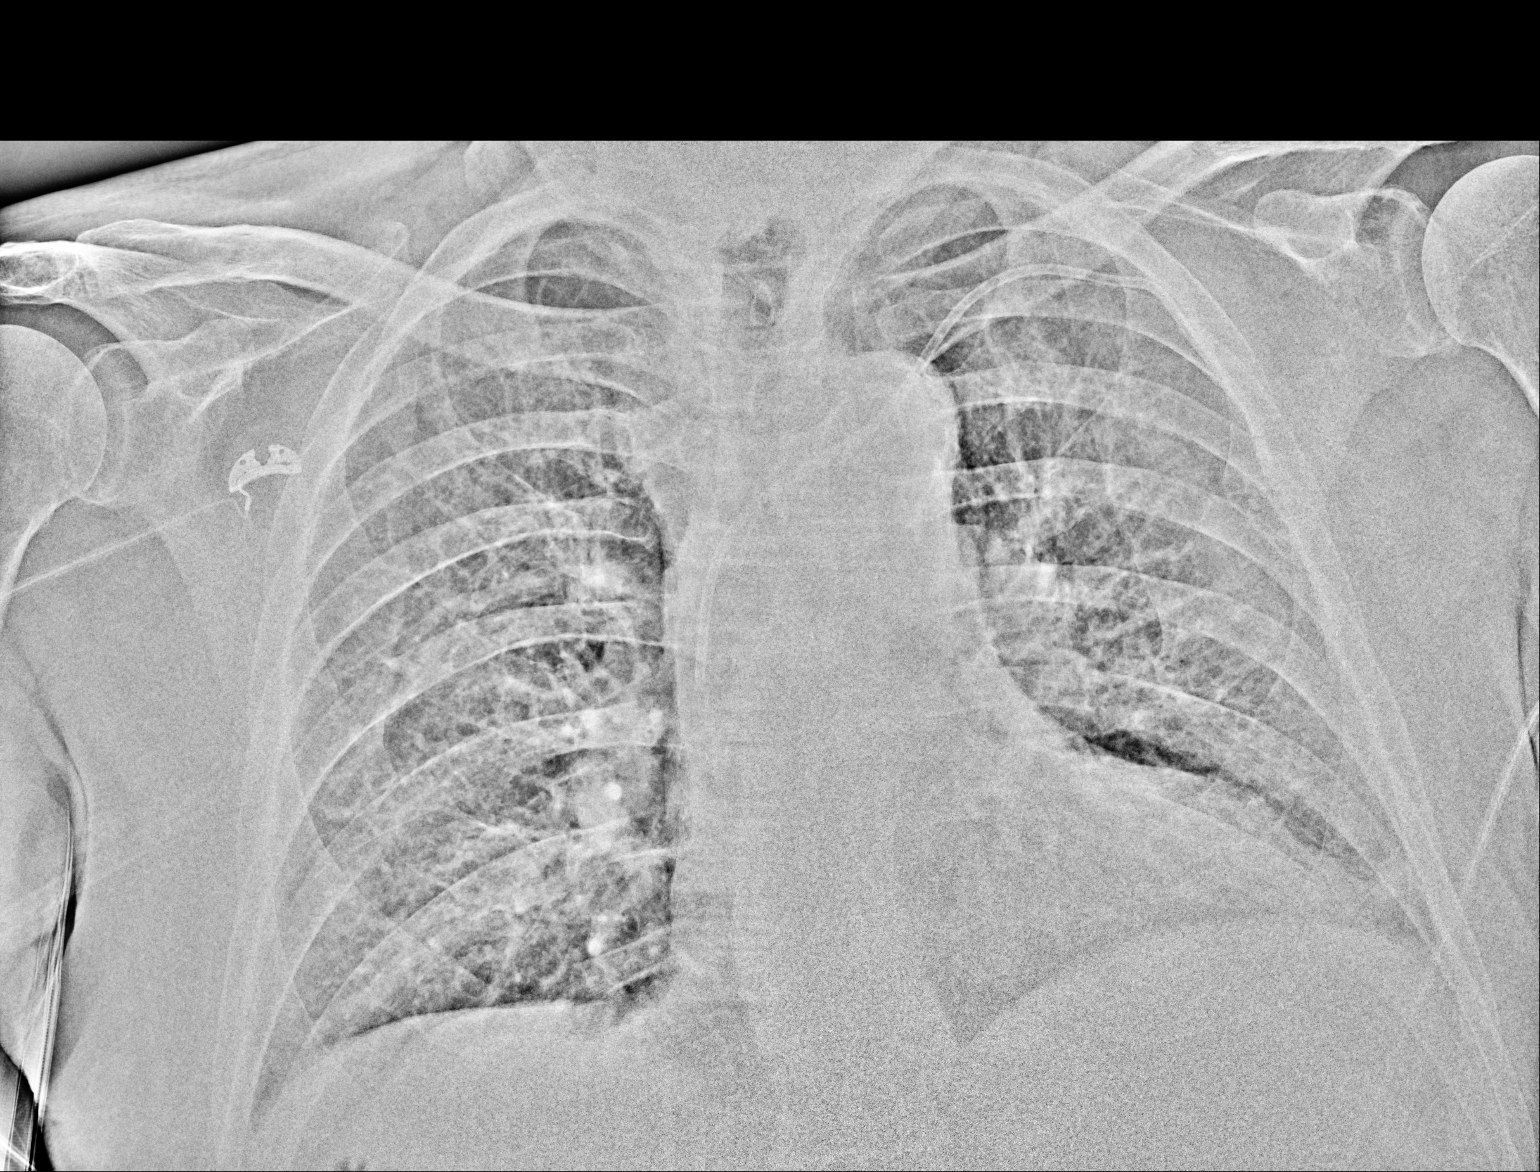

[2 of 2 positions shown; findings below may reference images not displayed]

FINDINGS: Port-A-Cath tip is in the superior vena cava approximately 2.3 cm
below the carina in good position. No pneumothorax.

Left hilar adenopathy and superior mediastinal adenopathy again
noted. No infiltrates or effusions. Heart size is normal.

Aortic atherosclerosis.
IMPRESSION: 1. Port-A-Cath appears in good position.
2. No pneumothorax.
3. Superior mediastinal and left hilar adenopathy, slightly less
apparent than on the prior chest x-ray.
4.  Aortic Atherosclerosis ([id number]-J31.1).

## 2019-08-20 ENCOUNTER — Other Ambulatory Visit: Payer: Self-pay | Admitting: Radiation Therapy

## 2019-08-29 ENCOUNTER — Other Ambulatory Visit: Payer: Self-pay

## 2019-08-29 ENCOUNTER — Ambulatory Visit (HOSPITAL_COMMUNITY)
Admission: RE | Admit: 2019-08-29 | Discharge: 2019-08-29 | Disposition: A | Discharge status: Home / Self Care | Payer: Medicare Other | Source: Ambulatory Visit | Attending: Hematology | Admitting: Hematology

## 2019-08-29 ENCOUNTER — Inpatient Hospital Stay (HOSPITAL_COMMUNITY): Payer: Medicare Other | Attending: Hematology

## 2019-08-29 DIAGNOSIS — K5900 Constipation, unspecified: Secondary | ICD-10-CM | POA: Insufficient documentation

## 2019-08-29 DIAGNOSIS — C7931 Secondary malignant neoplasm of brain: Secondary | ICD-10-CM | POA: Insufficient documentation

## 2019-08-29 DIAGNOSIS — Z87891 Personal history of nicotine dependence: Secondary | ICD-10-CM | POA: Diagnosis not present

## 2019-08-29 DIAGNOSIS — E785 Hyperlipidemia, unspecified: Secondary | ICD-10-CM | POA: Diagnosis not present

## 2019-08-29 DIAGNOSIS — Z7952 Long term (current) use of systemic steroids: Secondary | ICD-10-CM | POA: Diagnosis not present

## 2019-08-29 DIAGNOSIS — C3491 Malignant neoplasm of unspecified part of right bronchus or lung: Secondary | ICD-10-CM | POA: Insufficient documentation

## 2019-08-29 DIAGNOSIS — R197 Diarrhea, unspecified: Secondary | ICD-10-CM | POA: Diagnosis not present

## 2019-08-29 DIAGNOSIS — Z79899 Other long term (current) drug therapy: Secondary | ICD-10-CM | POA: Diagnosis not present

## 2019-08-29 DIAGNOSIS — Z7984 Long term (current) use of oral hypoglycemic drugs: Secondary | ICD-10-CM | POA: Diagnosis not present

## 2019-08-29 DIAGNOSIS — E119 Type 2 diabetes mellitus without complications: Secondary | ICD-10-CM | POA: Diagnosis not present

## 2019-08-29 DIAGNOSIS — J449 Chronic obstructive pulmonary disease, unspecified: Secondary | ICD-10-CM | POA: Diagnosis not present

## 2019-08-29 DIAGNOSIS — C771 Secondary and unspecified malignant neoplasm of intrathoracic lymph nodes: Secondary | ICD-10-CM | POA: Diagnosis not present

## 2019-08-29 DIAGNOSIS — Z9221 Personal history of antineoplastic chemotherapy: Secondary | ICD-10-CM | POA: Insufficient documentation

## 2019-08-29 DIAGNOSIS — I10 Essential (primary) hypertension: Secondary | ICD-10-CM | POA: Diagnosis not present

## 2019-08-29 DIAGNOSIS — M1990 Unspecified osteoarthritis, unspecified site: Secondary | ICD-10-CM | POA: Diagnosis not present

## 2019-08-29 LAB — CBC WITH DIFFERENTIAL/PLATELET
Abs Immature Granulocytes: 0.03 10*3/uL (ref 0.00–0.07)
Basophils Absolute: 0 10*3/uL (ref 0.0–0.1)
Basophils Relative: 0 %
Eosinophils Absolute: 0.3 10*3/uL (ref 0.0–0.5)
Eosinophils Relative: 4 %
HCT: 41.5 % (ref 39.0–52.0)
Hemoglobin: 13.5 g/dL (ref 13.0–17.0)
Immature Granulocytes: 0 %
Lymphocytes Relative: 16 %
Lymphs Abs: 1.4 10*3/uL (ref 0.7–4.0)
MCH: 32 pg (ref 26.0–34.0)
MCHC: 32.5 g/dL (ref 30.0–36.0)
MCV: 98.3 fL (ref 80.0–100.0)
Monocytes Absolute: 0.6 10*3/uL (ref 0.1–1.0)
Monocytes Relative: 7 %
Neutro Abs: 6.8 10*3/uL (ref 1.7–7.7)
Neutrophils Relative %: 73 %
Platelets: 289 10*3/uL (ref 150–400)
RBC: 4.22 MIL/uL (ref 4.22–5.81)
RDW: 13.8 % (ref 11.5–15.5)
WBC: 9.2 10*3/uL (ref 4.0–10.5)
nRBC: 0 % (ref 0.0–0.2)

## 2019-08-29 LAB — COMPREHENSIVE METABOLIC PANEL
ALT: 15 U/L (ref 0–44)
AST: 17 U/L (ref 15–41)
Albumin: 3.6 g/dL (ref 3.5–5.0)
Alkaline Phosphatase: 71 U/L (ref 38–126)
Anion gap: 9 (ref 5–15)
BUN: 14 mg/dL (ref 8–23)
CO2: 30 mmol/L (ref 22–32)
Calcium: 9.8 mg/dL (ref 8.9–10.3)
Chloride: 101 mmol/L (ref 98–111)
Creatinine, Ser: 0.67 mg/dL (ref 0.61–1.24)
GFR calc Af Amer: >60 mL/min (ref >60)
GFR calc non Af Amer: >60 mL/min (ref >60)
Glucose, Bld: 98 mg/dL (ref 70–99)
Potassium: 5.2 mmol/L — ABNORMAL HIGH (ref 3.5–5.1)
Sodium: 140 mmol/L (ref 135–145)
Total Bilirubin: 0.6 mg/dL (ref 0.3–1.2)
Total Protein: 6.7 g/dL (ref 6.5–8.1)

## 2019-08-29 LAB — MAGNESIUM: Magnesium: 1.6 mg/dL — ABNORMAL LOW (ref 1.7–2.4)

## 2019-08-29 MED ORDER — IOHEXOL 300 MG/ML  SOLN
100.0000 mL | Freq: Once | INTRAMUSCULAR | Status: AC | PRN
  Administered 2019-08-29: 100 mL via INTRAVENOUS

## 2019-09-04 ENCOUNTER — Other Ambulatory Visit: Payer: Self-pay

## 2019-09-04 ENCOUNTER — Encounter (HOSPITAL_COMMUNITY): Payer: Self-pay | Admitting: Hematology

## 2019-09-04 ENCOUNTER — Inpatient Hospital Stay (HOSPITAL_BASED_OUTPATIENT_CLINIC_OR_DEPARTMENT_OTHER): Payer: Medicare Other | Admitting: Hematology

## 2019-09-04 VITALS — BP 105/64 | HR 97 | Temp 97.3°F | Resp 18 | Wt 157.6 lb

## 2019-09-04 DIAGNOSIS — C3491 Malignant neoplasm of unspecified part of right bronchus or lung: Secondary | ICD-10-CM | POA: Diagnosis not present

## 2019-09-04 DIAGNOSIS — R296 Repeated falls: Secondary | ICD-10-CM

## 2019-09-04 NOTE — Progress Notes (Signed)
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[place])  08/22/2018 Initial Diagnosis   Small cell carcinoma of lung, right ([place])   08/23/2018 - 11/17/2018 Chemotherapy   The patient had palonosetron (ALOXI) injection 0.25 mg, 0.25 mg, Intravenous,  Once, 4 of 4 cycles Administration: 0.25 mg (08/23/2018), 0.25 mg (09/16/2018), 0.25 mg (10/07/2018), 0.25 mg (10/28/2018) pegfilgrastim-cbqv (UDENYCA) injection 6 mg, 6 mg, Subcutaneous, Once, 4 of 4 cycles Administration: 6 mg (08/27/2018), 6 mg (09/20/2018), 6 mg (10/11/2018), 6 mg (11/01/2018) CARBOplatin (PARAPLATIN) 560 mg in sodium chloride 0.9 % 250 mL chemo infusion, 560 mg (100 % of original dose 564 mg), Intravenous,  Once, 4 of 4 cycles Dose modification:   (original dose 564 mg, Cycle 1),   (original dose 523.5 mg, Cycle 3),   (original dose 523.5 mg, Cycle 4) Administration: 560 mg (08/23/2018), 560 mg (09/16/2018), 520 mg (10/07/2018), 510 mg (10/28/2018) etoposide (VEPESID) 220 mg in sodium chloride 0.9 % 1,000 mL chemo infusion, 100 mg/m2 = 220 mg, Intravenous,  Once, 4 of 4 cycles Administration: 220 mg (08/23/2018), 200 mg (09/16/2018), 200 mg (09/17/2018), 200 mg (09/18/2018), 200 mg (10/07/2018), 200 mg (10/08/2018), 200 mg (10/09/2018), 200 mg (10/28/2018), 200 mg (10/29/2018), 200 mg (10/30/2018) fosaprepitant (EMEND) 150 mg, dexamethasone (DECADRON) 12 mg in sodium chloride 0.9 % 145 mL IVPB, , Intravenous,  Once, 4 of 4 cycles Administration:  (08/23/2018),  (09/16/2018),  (10/07/2018),  (10/28/2018) durvalumab (IMFINZI) 1,500 mg in sodium chloride 0.9  % 100 mL chemo infusion, 1,500 mg (100 % of original dose 1,500 mg), Intravenous,  Once, 3 of 3 cycles Dose modification: 1,500 mg (original dose 1,500 mg, Cycle 2, Reason: Provider Judgment) Administration: 1,500 mg (09/16/2018), 1,500 mg (10/07/2018), 1,500 mg (10/28/2018)  for chemotherapy treatment.    Small cell carcinoma of lung, right ([place])  08/23/2018 - 11/17/2018 Chemotherapy   The patient had palonosetron (ALOXI) injection 0.25 mg, 0.25 mg, Intravenous,  Once, 4 of 4 cycles Administration: 0.25 mg (08/23/2018), 0.25 mg (09/16/2018), 0.25 mg (10/07/2018), 0.25 mg (10/28/2018) pegfilgrastim-cbqv (UDENYCA) injection 6 mg, 6 mg, Subcutaneous, Once, 4 of 4 cycles Administration: 6 mg (08/27/2018), 6 mg (09/20/2018), 6 mg (10/11/2018), 6 mg (11/01/2018) CARBOplatin (PARAPLATIN) 560 mg in sodium chloride 0.9 % 250 mL chemo infusion, 560 mg (100 % of original dose 564 mg), Intravenous,  Once, 4 of 4 cycles Dose modification:   (original dose 564 mg, Cycle 1),   (original dose 523.5 mg, Cycle 3),   (original dose 523.5 mg, Cycle 4) Administration: 560 mg (08/23/2018), 560 mg (09/16/2018), 520 mg (10/07/2018), 510 mg (10/28/2018) etoposide (VEPESID) 220 mg in sodium chloride 0.9 % 1,000 mL chemo infusion, 100 mg/m2 = 220 mg, Intravenous,  Once, 4 of 4 cycles Administration: 220 mg (08/23/2018), 200 mg (09/16/2018), 200 mg (09/17/2018), 200 mg (09/18/2018), 200 mg (10/07/2018), 200 mg (10/08/2018), 200 mg (10/09/2018), 200 mg (10/28/2018), 200 mg (10/29/2018), 200 mg (10/30/2018) fosaprepitant (EMEND) 150 mg, dexamethasone (DECADRON) 12 mg in sodium chloride 0.9 % 145 mL IVPB, , Intravenous,  Once, 4 of 4 cycles Administration:  (08/23/2018),  (09/16/2018),  (10/07/2018),  (10/28/2018) durvalumab (IMFINZI) 1,500 mg  in sodium chloride 0.9 % 100 mL chemo infusion, 1,500 mg (100 % of original dose 1,500 mg), Intravenous,  Once, 3 of 3 cycles Dose modification: 1,500 mg (original dose 1,500 mg, Cycle 2, Reason: Provider  Judgment) Administration: 1,500 mg (09/16/2018), 1,500 mg (10/07/2018), 1,500 mg (10/28/2018)  for chemotherapy treatment.    08/26/2018 Initial Diagnosis   Small cell carcinoma of lung, right ([place])   11/20/2018 -  Chemotherapy   The patient had durvalumab (IMFINZI) 1,500 mg in sodium chloride 0.9 % 100 mL chemo infusion, 1,500 mg (100 % of original dose 1,500 mg), Intravenous,  Once, 4 of 9 cycles Dose modification: 1,500 mg (original dose 1,500 mg, Cycle 1, Reason: Other (see comments)) Administration: 1,500 mg (11/20/2018), 1,500 mg (12/19/2018), 1,500 mg (01/16/2019), 1,500 mg (02/13/2019)  for chemotherapy treatment.       CANCER STAGING: Cancer Staging No matching staging information was found for the patient.   INTERVAL HISTORY:  [name] 74 y.o. male seen for follow-up of for small cell lung cancer.  Denies any new onset chest pains or lightheadedness.  Reports problems with balance and has fallen multiple times at home.  He is no longer going to the gym because of fear of falling.  Appetite is 50%.  Energy levels are low at 25%.  Reports pain all over the body, 5 out of 10.  Constipation alternating with diarrhea is stable.  No headaches or vision changes reported.  REVIEW OF SYSTEMS:  Review of Systems  Gastrointestinal: Positive for constipation and diarrhea.  Neurological: Positive for dizziness.  Psychiatric/Behavioral: Positive for sleep disturbance.  All other systems reviewed and are negative.    PAST MEDICAL/SURGICAL HISTORY:  Past Medical History:  Diagnosis Date  . Actinic keratosis   . Arthritis   . Colitis MAY 2012    CT ABD/PELVIS HEP FLEXURE  . COPD (chronic obstructive pulmonary disease) ([place])   . Diabetes mellitus without complication ([place])   . History of kidney stones   . Hyperlipemia   . Hypertension   . NSAID long-term use NAPROXEN FOR OA  . SCL Ca dx'd 08/2018   Past Surgical History:  Procedure Laterality Date  . BACK SURGERY     spinal stenosis   . BASAL CELL CARCINOMA EXCISION  FACE, arms feet, leg  . CHOLECYSTECTOMY  JUNE 2011 MJ   STONES, PANCREATITIS  . KNEE ARTHROSCOPY WITH MEDIAL MENISECTOMY Right 03/24/2015   Procedure: KNEE ARTHROSCOPY WITH MEDIAL MENISECTOMY;  Surgeon: Carole Civil, MD;  Location: [facility];  Service: Orthopedics;  Laterality: Right;  . KNEE SURGERY Left LEFT   arthroscopy  . LITHOTRIPSY  80s  . PORTACATH PLACEMENT Left 09/11/2018   Procedure: INSERTION PORT-A-CATH;  Surgeon: Virl Cagey, MD;  Location: [facility];  Service: General;  Laterality: Left;  . SPINE SURGERY       SOCIAL HISTORY:  Social History   Socioeconomic History  . Marital status: Divorced    Spouse name: Not on file  . Number of children: 0  . Years of education: Not on file  . Highest education level: Not on file  Occupational History  . Occupation: [place] [employer]   . Occupation: Manufacturing engineer   Tobacco Use  . Smoking status: Former Smoker    Packs/day: 1.00    Years: 50.00    Pack years: 50.00    Types: Cigarettes    Quit date: 08/07/2018    Years since quitting: 1.0  . Smokeless tobacco: Never Used  Substance and Sexual  Activity  . Alcohol use: Yes    Comment: once a month  . Drug use: No  . Sexual activity: Never    Birth control/protection: None  Other Topics Concern  . 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   Social Determinants of Health   Financial Resource Strain:   . Difficulty of Paying Living Expenses: Not on file  Food Insecurity:   . Worried About Charity fundraiser in the Last Year: Not on file  . Ran Out of Food in the Last Year: Not on file  Transportation Needs:   . Lack of Transportation (Medical): Not on file  . Lack of Transportation (Non-Medical): Not on file  Physical Activity:   . Days of Exercise per Week: Not on file  . Minutes of Exercise per Session: Not on file  Stress:    . Feeling of Stress : Not on file  Social Connections:   . Frequency of Communication with Friends and Family: Not on file  . Frequency of Social Gatherings with Friends and Family: Not on file  . Attends Religious Services: Not on file  . Active Member of Clubs or Organizations: Not on file  . Attends Archivist Meetings: Not on file  . Marital Status: Not on file  Intimate Partner Violence:   . Fear of Current or Ex-Partner: Not on file  . Emotionally Abused: Not on file  . Physically Abused: Not on file  . Sexually Abused: Not on file    FAMILY HISTORY:  Family History  Problem Relation Age of Onset  . Cancer Mother        lung  . Cancer Father        lung and liver  . Colon polyps Neg Hx   . Colon cancer Neg Hx     CURRENT MEDICATIONS:  Outpatient Encounter Medications as of 09/04/2019  Medication Sig Note  . aspirin 81 MG tablet Take 81 mg by mouth daily.     . Cholecalciferol 50 MCG (2000 UT) TBDP Take 1 tablet by mouth daily.   [place] Oris (IMFINZI IV) Inject into the vein every 21 ( twenty-one) days. 03/24/2019: Patient states that he was scheduled for injection last week but it was cancelled. Patient is due for injection for 03/2019  . fluorouracil (EFUDEX) 5 % cream APP AA OF SKIN ON BOTH ARMS BID FOR 3 WEEKS   . levothyroxine (SYNTHROID) 200 MCG tablet Take 200 mcg by mouth daily.   . metFORMIN (GLUCOPHAGE) 500 MG tablet Take 500 mg by mouth 2 (two) times daily with a meal.   . mirtazapine (REMERON) 15 MG tablet Take 1 tablet (15 mg total) by mouth at bedtime.   . Multiple Vitamin (MULTIVITAMIN) tablet Take 1 tablet by mouth daily.   . rosuvastatin (CRESTOR) 10 MG tablet Take 10 mg by mouth daily.     . traZODone (DESYREL) 50 MG tablet Take 50 mg by mouth at bedtime.   . verapamil (COVERA HS) 240 MG (CO) 24 hr tablet Take 120 mg by mouth at bedtime.    Marland Kitchen albuterol (PROVENTIL HFA;VENTOLIN HFA) 108 (90 Base) MCG/ACT inhaler Inhale 2 puffs into the lungs  every 6 (six) hours as needed for wheezing or shortness of breath.    . docusate sodium (COLACE) 100 MG capsule Take 100 mg by mouth 2 (two) times daily.   Marland Kitchen  meclizine (ANTIVERT) 25 MG tablet Take 25 mg by mouth 3 (three) times daily.   . [DISCONTINUED] predniSONE (DELTASONE) 20 MG tablet Take 3 tablets (60 mg total) by mouth daily with breakfast. (Patient not taking: Reported on 05/07/2019) 03/24/2019: 2 tabs remaining in course   No facility-administered encounter medications on file as of 09/04/2019.    ALLERGIES:  No Known Allergies   PHYSICAL EXAM:  ECOG Performance status: 1  Vitals:   09/04/19 1435  BP: 105/64  Pulse: 97  Resp: 18  Temp: (!) 97.3 F (36.3 C)  SpO2: 98%   Filed Weights   09/04/19 1435  Weight: 157 lb 9.6 oz (71.5 kg)    Physical Exam Vitals reviewed.  Constitutional:      Appearance: Normal appearance.  Cardiovascular:     Rate and Rhythm: Normal rate and regular rhythm.     Heart sounds: Normal heart sounds.  Pulmonary:     Effort: Pulmonary effort is normal.     Breath sounds: Normal breath sounds.  Abdominal:     General: There is no distension.     Palpations: Abdomen is soft. There is no mass.  Musculoskeletal:        General: No swelling.  Lymphadenopathy:     Cervical: No cervical adenopathy.  Skin:    General: Skin is warm.  Neurological:     General: No focal deficit present.     Mental Status: He is alert and oriented to person, place, and time.  Psychiatric:        Mood and Affect: Mood normal.        Behavior: Behavior normal.      LABORATORY DATA:  I have reviewed the labs as listed.  CBC    Component Value Date/Time   WBC 9.2 08/29/2019 1044   RBC 4.22 08/29/2019 1044   HGB 13.5 08/29/2019 1044   HCT 41.5 08/29/2019 1044   PLT 289 08/29/2019 1044   MCV 98.3 08/29/2019 1044   MCH 32.0 08/29/2019 1044   MCHC 32.5 08/29/2019 1044   RDW 13.8 08/29/2019 1044   LYMPHSABS 1.4 08/29/2019 1044   MONOABS 0.6 08/29/2019  1044   EOSABS 0.3 08/29/2019 1044   BASOSABS 0.0 08/29/2019 1044   CMP Latest Ref Rng & Units 08/29/2019 05/22/2019 04/22/2019  Glucose 70 - 99 mg/dL 98 94 170(H)  BUN 8 - 23 mg/dL 14 12 16   Creatinine 0.61 - 1.24 mg/dL 0.67 0.80 0.83  Sodium 135 - 145 mmol/L 140 139 139  Potassium 3.5 - 5.1 mmol/L 5.2(H) 5.0 4.5  Chloride 98 - 111 mmol/L 101 102 102  CO2 22 - 32 mmol/L 30 29 26   Calcium 8.9 - 10.3 mg/dL 9.8 9.4 9.7  Total Protein 6.5 - 8.1 g/dL 6.7 6.8 6.4(L)  Total Bilirubin 0.3 - 1.2 mg/dL 0.6 0.3 0.8  Alkaline Phos 38 - 126 U/L 71 97 86  AST 15 - 41 U/L 17 14(L) 23  ALT 0 - 44 U/L 15 10 14        DIAGNOSTIC IMAGING:  I have independently reviewed the scans and discussed with the patient.      ASSESSMENT & PLAN:   Small cell carcinoma of lung, right ([place]) 1.  Extensive stage small cell lung cancer: -4 cycles of carboplatin, VP-16 and durvalumab from 08/23/2018 through 10/28/2018. -CT CAP on 01/14/2019 showed continued further decrease in mediastinal, hilar and axillary adenopathy.  Tiny pericardial effusion and small right pleural effusion. -Last durvalumab on 02/13/2019, held due to colitis. -  I have reviewed CT CAP from 08/29/2019.  Continued decrease in size of the index prevascular mediastinal lymph node.  No mediastinal or hilar adenopathy identified.  No specific findings to suggest progression of disease.  Small nonsolid nodule in the left lower lobe measuring 1.2 cm which is nonspecific.  Bilateral small pleural effusions, right greater than left stable.  Small pericardial effusion is also unchanged. -He reports frequent falls at home and feels off balance.  Denies any lightheadedness. -We reviewed his labs.  Potassium is slightly high at 5.2.  I have asked him to discontinue his multivitamin with minerals.  Magnesium is slightly low at 1.6.  He was encouraged to take over-the-counter magnesium supplements. -We have scheduled him for MRI of the brain and will follow up with a  phone visit.  We will continue to monitor CT scans of the lung every 3 months.  2.  Colitis: -He experienced diarrhea with blood and was started on prednisone 60 mg daily on 03/13/2019. -We were tapering on a weekly basis as his diarrhea and bleeding improved. - Prednisone tapered off around 04/27/2019. -Denies any further diarrhea or bleeding per rectum.   3.  Metastatic disease to the brain: - He had 3 small subcentimeter metastasis in the right insula, right occipital lobe and right cerebellar hemisphere, treated with whole brain RT from 09/02/2018 through 09/13/2018. -We reviewed MRI of brain from 06/19/2019 which showed no residual enhancement of the 3 previously treated brain metastasis.  No evidence of intracranial metastasis. -He reports falling occasionally at home.  He is using a cane.  He is not able to go to the gym because of fear of falling. -We will obtain MRI of the brain with and without gadolinium.      Orders placed this encounter:  Orders Placed This Encounter  Procedures  . MR Brain W Wo Contrast  . CT Abdomen Pelvis W Contrast  . CT Chest W Contrast      Derek Jack, MD [place] [phone]

## 2019-09-04 NOTE — Patient Instructions (Addendum)
[place] at [facility] Discharge Instructions  You were seen today by Dr. Delton Coombes. He went over your recent lab results. Due to potassium being elevated please stop taking your multivitamin. He will schedule you for a MRI of your brain soon. He will repeat your scans in 3 months. He will follow up with you after scans for your MRI resutls.  Thank you for choosing [place] at [facility] to provide your oncology and hematology care.  To afford each patient quality time with our provider, please arrive at least 15 minutes before your scheduled appointment time.   If you have a lab appointment with the [facility] please come in thru the  Main Entrance and check in at the main information desk  You need to re-schedule your appointment should you arrive 10 or more minutes late.  We strive to give you quality time with our providers, and arriving late affects you and other patients whose appointments are after yours.  Also, if you no show three or more times for appointments you may be dismissed from the clinic at the providers discretion.     Again, thank you for choosing [facility].  Our hope is that these requests will decrease the amount of time that you wait before being seen by our physicians.       _____________________________________________________________  Should you have questions after your visit to [facility], please contact our office at [phone] between the hours of 8:00 a.m. and 4:30 p.m.  Voicemails left after 4:00 p.m. will not be returned until the following business day.  For prescription refill requests, have your pharmacy contact our office and allow 72 hours.    Cancer Center Support Programs:   > Cancer Support Group  2nd Tuesday of the month 1pm-2pm, Journey Room

## 2019-09-04 NOTE — Assessment & Plan Note (Signed)
1.  Extensive stage small cell lung cancer: -4 cycles of carboplatin, VP-16 and durvalumab from 08/23/2018 through 10/28/2018. -CT CAP on 01/14/2019 showed continued further decrease in mediastinal, hilar and axillary adenopathy.  Tiny pericardial effusion and small right pleural effusion. -Last durvalumab on 02/13/2019, held due to colitis. -I have reviewed CT CAP from 08/29/2019.  Continued decrease in size of the index prevascular mediastinal lymph node.  No mediastinal or hilar adenopathy identified.  No specific findings to suggest progression of disease.  Small nonsolid nodule in the left lower lobe measuring 1.2 cm which is nonspecific.  Bilateral small pleural effusions, right greater than left stable.  Small pericardial effusion is also unchanged. -He reports frequent falls at home and feels off balance.  Denies any lightheadedness. -We reviewed his labs.  Potassium is slightly high at 5.2.  I have asked him to discontinue his multivitamin with minerals.  Magnesium is slightly low at 1.6.  He was encouraged to take over-the-counter magnesium supplements. -We have scheduled him for MRI of the brain and will follow up with a phone visit.  We will continue to monitor CT scans of the lung every 3 months.  2.  Colitis: -He experienced diarrhea with blood and was started on prednisone 60 mg daily on 03/13/2019. -We were tapering on a weekly basis as his diarrhea and bleeding improved. - Prednisone tapered off around 04/27/2019. -Denies any further diarrhea or bleeding per rectum.   3.  Metastatic disease to the brain: - He had 3 small subcentimeter metastasis in the right insula, right occipital lobe and right cerebellar hemisphere, treated with whole brain RT from 09/02/2018 through 09/13/2018. -We reviewed MRI of brain from 06/19/2019 which showed no residual enhancement of the 3 previously treated brain metastasis.  No evidence of intracranial metastasis. -He reports falling occasionally at home.  He is  using a cane.  He is not able to go to the gym because of fear of falling. -We will obtain MRI of the brain with and without gadolinium.

## 2019-09-18 ENCOUNTER — Other Ambulatory Visit: Payer: Self-pay

## 2019-09-18 ENCOUNTER — Ambulatory Visit (HOSPITAL_COMMUNITY)
Admission: RE | Admit: 2019-09-18 | Discharge: 2019-09-18 | Disposition: A | Discharge status: Home / Self Care | Payer: Medicare Other | Source: Ambulatory Visit | Attending: Radiation Oncology | Admitting: Radiation Oncology

## 2019-09-18 ENCOUNTER — Ambulatory Visit (HOSPITAL_COMMUNITY): Admission: RE | Admit: 2019-09-18 | Payer: Medicare Other | Source: Ambulatory Visit

## 2019-09-18 DIAGNOSIS — C7931 Secondary malignant neoplasm of brain: Secondary | ICD-10-CM | POA: Insufficient documentation

## 2019-09-18 MED ORDER — GADOBUTROL 1 MMOL/ML IV SOLN
7.0000 mL | Freq: Once | INTRAVENOUS | Status: AC | PRN
  Administered 2019-09-18: 16:00:00 7 mL via INTRAVENOUS

## 2019-09-22 ENCOUNTER — Encounter: Payer: Self-pay | Admitting: Radiation Oncology

## 2019-09-22 ENCOUNTER — Other Ambulatory Visit: Payer: Self-pay

## 2019-09-22 ENCOUNTER — Ambulatory Visit
Admission: RE | Admit: 2019-09-22 | Discharge: 2019-09-22 | Disposition: A | Discharge status: Home / Self Care | Payer: Medicare Other | Source: Ambulatory Visit | Attending: Radiation Oncology | Admitting: Radiation Oncology

## 2019-09-22 DIAGNOSIS — C7931 Secondary malignant neoplasm of brain: Secondary | ICD-10-CM

## 2019-09-22 DIAGNOSIS — C3491 Malignant neoplasm of unspecified part of right bronchus or lung: Secondary | ICD-10-CM

## 2019-09-22 NOTE — Progress Notes (Signed)
Radiation Oncology         [phone] ________________________________  Outpatient Follow Up - Conducted via telephone due to current COVID-19 concerns for limiting patient exposure  I spoke with the patient to conduct this consult visit via telephone to spare the patient unnecessary potential exposure in the healthcare setting during the current COVID-19 pandemic. The patient was notified in advance and was offered a [place] meeting to allow for face to face communication but unfortunately reported that they did not have the appropriate resources/technology to support such a visit and instead preferred to proceed with a telephone visit.   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 Chest / 9 Gy in 3 fractions 2. Chest Target 1 / 25 Gy in 10 fractionTotal dose 34 Gy 3. Whole Brain / 30 Gy in 10 fractions   Narrative:   In summary this is a pleasant, 74 y.o. gentleman with extensive stage small cell carcinoma of the right lung. He was seen while hospitalized with symptoms of SVC syndrome and was diagnosed with extensive stage disease. He received palliative radiotherapy to the chest along with whole brain radiotherapy due to three metastatic lesions identified during his work up. He's done well since his radiotherapy and has remained without disease in the brain since. His last MRI on 09/18/19 did not show active lesions or new disease. He had been taking immunotherapy but developed colitis, so this was discontinued. He continues to follow in surveillance with Dr. Delton Coombes.    On review of systems, the patient reports that he is doing pretty well. He reports he is still unable to gain weight despite his remeron. He is able to eat however.  He denies any chest pain,  shortness of breath, cough, fevers, chills, night sweats. He is  He denies any visual, auditory changes. He does have some dizziness that he states makes him uneasy about exercising at the gym. He denies any progressive symptoms. He denies bladder disturbances, and denies abdominal pain, nausea or vomiting. He does fluctuate between diarrhea and constipation, but denies any rectal bleeding. He denies any new musculoskeletal or joint aches or pains, new skin lesions or concerns. A complete review of systems is obtained and is otherwise negative.   Past Medical History:  Past Medical History:  Diagnosis Date  . Actinic keratosis   . Arthritis   . Colitis MAY 2012    CT ABD/PELVIS HEP FLEXURE  . COPD (chronic obstructive pulmonary disease) ([place])   . Diabetes mellitus without complication ([place])   . History of kidney stones   . Hyperlipemia   . Hypertension   . NSAID long-term use NAPROXEN FOR OA  . SCL Ca dx'd 08/2018    Past Surgical History: Past Surgical History:  Procedure Laterality Date  . BACK SURGERY     spinal stenosis  . BASAL CELL CARCINOMA EXCISION  FACE, arms feet, leg  . CHOLECYSTECTOMY  JUNE 2011 MJ   STONES, PANCREATITIS  . KNEE ARTHROSCOPY WITH MEDIAL MENISECTOMY Right 03/24/2015   Procedure: KNEE ARTHROSCOPY WITH MEDIAL MENISECTOMY;  Surgeon: Carole Civil, MD;  Location: [facility];  Service: Orthopedics;  Laterality: Right;  . KNEE SURGERY Left LEFT   arthroscopy  . LITHOTRIPSY  80s  . PORTACATH PLACEMENT Left 09/11/2018  Procedure: INSERTION PORT-A-CATH;  Surgeon: Virl Cagey, MD;  Location: [facility];  Service: General;  Laterality: Left;  . SPINE SURGERY      Social History:  Social History   Socioeconomic History  . Marital status: Divorced    Spouse name: Not on file  . Number of children: 0  . Years of education: Not on file  . Highest education level: Not on file  Occupational History  . Occupation: [employer]   . Occupation: Manufacturing engineer    Tobacco Use  . Smoking status: Former Smoker    Packs/day: 1.00    Years: 50.00    Pack years: 50.00    Types: Cigarettes    Quit date: 08/07/2018    Years since quitting: 1.1  . Smokeless tobacco: Never Used  Substance and Sexual Activity  . Alcohol use: Yes    Comment: once a month  . Drug use: No  . Sexual activity: Never    Birth control/protection: None  Other Topics Concern  . 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   Social Determinants of Health   Financial Resource Strain:   . Difficulty of Paying Living Expenses: Not on file  Food Insecurity:   . Worried About Charity fundraiser in the Last Year: Not on file  . Ran Out of Food in the Last Year: Not on file  Transportation Needs:   . Lack of Transportation (Medical): Not on file  . Lack of Transportation (Non-Medical): Not on file  Physical Activity:   . Days of Exercise per Week: Not on file  . Minutes of Exercise per Session: Not on file  Stress:   . Feeling of Stress : Not on file  Social Connections:   . Frequency of Communication with Friends and Family: Not on file  . Frequency of Social Gatherings with Friends and Family: Not on file  . Attends Religious Services: Not on file  . Active Member of Clubs or Organizations: Not on file  . Attends Archivist Meetings: Not on file  . Marital Status: Not on file  Intimate Partner Violence:   . Fear of Current or Ex-Partner: Not on file  . Emotionally Abused: Not on file  . Physically Abused: Not on file  . Sexually Abused: Not on file    Family History: Family History  Problem Relation Age of Onset  . Cancer Mother        lung  . Cancer Father        lung and liver  . Colon polyps Neg Hx   . Colon cancer Neg Hx    Medications: Current Outpatient Medications  Medication Sig Dispense Refill  . albuterol (PROVENTIL HFA;VENTOLIN HFA) 108  (90 Base) MCG/ACT inhaler Inhale 2 puffs into the lungs every 6 (six) hours as needed for wheezing or shortness of breath.     Marland Kitchen aspirin 81 MG tablet Take 81 mg by mouth daily.      . Cholecalciferol 50 MCG (2000 UT) TBDP Take 1 tablet by mouth daily.    Marland Kitchen docusate sodium (COLACE) 100 MG capsule Take 100 mg by mouth 2 (two) times daily.    Hunt Oris (IMFINZI IV) Inject into the vein every 21 ( twenty-one) days.    . fluorouracil (EFUDEX) 5 % cream APP AA OF SKIN ON BOTH ARMS BID FOR 3 WEEKS    .  levothyroxine (SYNTHROID) 200 MCG tablet Take 200 mcg by mouth daily.    . meclizine (ANTIVERT) 25 MG tablet Take 25 mg by mouth 3 (three) times daily.    . metFORMIN (GLUCOPHAGE) 500 MG tablet Take 500 mg by mouth 2 (two) times daily with a meal.    . mirtazapine (REMERON) 15 MG tablet Take 1 tablet (15 mg total) by mouth at bedtime. 30 tablet 1  . Multiple Vitamin (MULTIVITAMIN) tablet Take 1 tablet by mouth daily.    . rosuvastatin (CRESTOR) 10 MG tablet Take 10 mg by mouth daily.      . traZODone (DESYREL) 50 MG tablet Take 50 mg by mouth at bedtime.    . verapamil (COVERA HS) 240 MG (CO) 24 hr tablet Take 120 mg by mouth at bedtime.      No current facility-administered medications for this encounter.    Allergies:  No Known Allergies  Physical Exam:  Unable to assess due to encounter type.   ECOG Status: 0  Impression/Plan: 1. 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 The patient's MRI was reviewed in clinic and he does not have any new or active disease. We will plan to follow him at 4 month intervals given this. He continues with Dr. Delton Coombes in [place] and had been on immunotherapy, however this was discontinued due to immune mediated colitis. He will continue otherwise in observation. 2. Difficulty gaining weight. He is taking remeron without improvement in his weight. He will follow up tomorrow with Dr. Delton Coombes as well and we  will follow this expectantly. 3. Dizziness. The patient's scans show post radiation changes. He is wary of certain activities he thinks could create risks of fall. We can introduce him to Dr. Mickeal Skinner if he would like. For now he wishes to follow this expectantly.   Given current concerns for patient exposure during the COVID-19 pandemic, this encounter was conducted via telephone.  The patient has given verbal consent for this type of encounter. The time spent during this encounter was 25 minutes and 50% of that time was spent in preparation, discussion, and  in the coordination of his care. The attendants for this meeting include Shona Simpson, [facility] and Asencion Islam  During the encounter, Shona Simpson [facility] was located remotely at home. CLOYS VERA  was located at home.    Carola Rhine, PAC

## 2019-09-23 ENCOUNTER — Inpatient Hospital Stay (HOSPITAL_COMMUNITY): Payer: Medicare Other | Attending: Hematology | Admitting: Hematology

## 2019-09-23 ENCOUNTER — Encounter (HOSPITAL_COMMUNITY): Payer: Self-pay | Admitting: Hematology

## 2019-09-23 ENCOUNTER — Other Ambulatory Visit (HOSPITAL_COMMUNITY): Payer: Self-pay | Admitting: *Deleted

## 2019-09-23 ENCOUNTER — Other Ambulatory Visit: Payer: Self-pay

## 2019-09-23 VITALS — BP 124/69 | HR 76 | Temp 97.3°F | Resp 18 | Wt 155.8 lb

## 2019-09-23 DIAGNOSIS — Z9221 Personal history of antineoplastic chemotherapy: Secondary | ICD-10-CM | POA: Insufficient documentation

## 2019-09-23 DIAGNOSIS — M1990 Unspecified osteoarthritis, unspecified site: Secondary | ICD-10-CM | POA: Insufficient documentation

## 2019-09-23 DIAGNOSIS — R42 Dizziness and giddiness: Secondary | ICD-10-CM | POA: Insufficient documentation

## 2019-09-23 DIAGNOSIS — Z923 Personal history of irradiation: Secondary | ICD-10-CM | POA: Diagnosis not present

## 2019-09-23 DIAGNOSIS — C3491 Malignant neoplasm of unspecified part of right bronchus or lung: Secondary | ICD-10-CM

## 2019-09-23 DIAGNOSIS — C7931 Secondary malignant neoplasm of brain: Secondary | ICD-10-CM | POA: Diagnosis not present

## 2019-09-23 DIAGNOSIS — E119 Type 2 diabetes mellitus without complications: Secondary | ICD-10-CM | POA: Insufficient documentation

## 2019-09-23 DIAGNOSIS — J449 Chronic obstructive pulmonary disease, unspecified: Secondary | ICD-10-CM | POA: Diagnosis not present

## 2019-09-23 DIAGNOSIS — E785 Hyperlipidemia, unspecified: Secondary | ICD-10-CM | POA: Insufficient documentation

## 2019-09-23 DIAGNOSIS — Z7984 Long term (current) use of oral hypoglycemic drugs: Secondary | ICD-10-CM | POA: Insufficient documentation

## 2019-09-23 DIAGNOSIS — Z79899 Other long term (current) drug therapy: Secondary | ICD-10-CM | POA: Insufficient documentation

## 2019-09-23 DIAGNOSIS — Z7982 Long term (current) use of aspirin: Secondary | ICD-10-CM | POA: Diagnosis not present

## 2019-09-23 DIAGNOSIS — C771 Secondary and unspecified malignant neoplasm of intrathoracic lymph nodes: Secondary | ICD-10-CM | POA: Insufficient documentation

## 2019-09-23 DIAGNOSIS — I10 Essential (primary) hypertension: Secondary | ICD-10-CM | POA: Diagnosis not present

## 2019-09-23 DIAGNOSIS — Z87891 Personal history of nicotine dependence: Secondary | ICD-10-CM | POA: Diagnosis not present

## 2019-09-23 NOTE — Progress Notes (Signed)
[name], [name] [id number]   CLINIC:  Medical Oncology/Hematology  PCP:  [name], [place] [address] [phone]   REASON FOR VISIT:  Follow-up for extensive stage small cell lung cancer with brain metastasis   CURRENT THERAPY:Durvalumab every 4 weeks.   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[place])  08/22/2018 Initial Diagnosis   Small cell carcinoma of lung, right ([place])   08/23/2018 - 11/17/2018 Chemotherapy   The patient had palonosetron (ALOXI) injection 0.25 mg, 0.25 mg, Intravenous,  Once, 4 of 4 cycles Administration: 0.25 mg (08/23/2018), 0.25 mg (09/16/2018), 0.25 mg (10/07/2018), 0.25 mg (10/28/2018) pegfilgrastim-cbqv (UDENYCA) injection 6 mg, 6 mg, Subcutaneous, Once, 4 of 4 cycles Administration: 6 mg (08/27/2018), 6 mg (09/20/2018), 6 mg (10/11/2018), 6 mg (11/01/2018) CARBOplatin (PARAPLATIN) 560 mg in sodium chloride 0.9 % 250 mL chemo infusion, 560 mg (100 % of original dose 564 mg), Intravenous,  Once, 4 of 4 cycles Dose modification:   (original dose 564 mg, Cycle 1),   (original dose 523.5 mg, Cycle 3),   (original dose 523.5 mg, Cycle 4) Administration: 560 mg (08/23/2018), 560 mg (09/16/2018), 520 mg (10/07/2018), 510 mg (10/28/2018) etoposide (VEPESID) 220 mg in sodium chloride 0.9 % 1,000 mL chemo infusion, 100 mg/m2 = 220 mg, Intravenous,  Once, 4 of 4 cycles Administration: 220 mg (08/23/2018), 200 mg (09/16/2018), 200 mg (09/17/2018), 200 mg (09/18/2018), 200 mg (10/07/2018), 200 mg (10/08/2018), 200 mg (10/09/2018), 200 mg (10/28/2018), 200 mg (10/29/2018), 200 mg (10/30/2018) fosaprepitant (EMEND) 150 mg, dexamethasone (DECADRON) 12 mg in sodium chloride 0.9 % 145 mL IVPB, , Intravenous,  Once, 4 of 4 cycles Administration:  (08/23/2018),  (09/16/2018),  (10/07/2018),  (10/28/2018) durvalumab (IMFINZI) 1,500 mg in sodium chloride 0.9  % 100 mL chemo infusion, 1,500 mg (100 % of original dose 1,500 mg), Intravenous,  Once, 3 of 3 cycles Dose modification: 1,500 mg (original dose 1,500 mg, Cycle 2, Reason: Provider Judgment) Administration: 1,500 mg (09/16/2018), 1,500 mg (10/07/2018), 1,500 mg (10/28/2018)  for chemotherapy treatment.    Small cell carcinoma of lung, right ([place])  08/23/2018 - 11/17/2018 Chemotherapy   The patient had palonosetron (ALOXI) injection 0.25 mg, 0.25 mg, Intravenous,  Once, 4 of 4 cycles Administration: 0.25 mg (08/23/2018), 0.25 mg (09/16/2018), 0.25 mg (10/07/2018), 0.25 mg (10/28/2018) pegfilgrastim-cbqv (UDENYCA) injection 6 mg, 6 mg, Subcutaneous, Once, 4 of 4 cycles Administration: 6 mg (08/27/2018), 6 mg (09/20/2018), 6 mg (10/11/2018), 6 mg (11/01/2018) CARBOplatin (PARAPLATIN) 560 mg in sodium chloride 0.9 % 250 mL chemo infusion, 560 mg (100 % of original dose 564 mg), Intravenous,  Once, 4 of 4 cycles Dose modification:   (original dose 564 mg, Cycle 1),   (original dose 523.5 mg, Cycle 3),   (original dose 523.5 mg, Cycle 4) Administration: 560 mg (08/23/2018), 560 mg (09/16/2018), 520 mg (10/07/2018), 510 mg (10/28/2018) etoposide (VEPESID) 220 mg in sodium chloride 0.9 % 1,000 mL chemo infusion, 100 mg/m2 = 220 mg, Intravenous,  Once, 4 of 4 cycles Administration: 220 mg (08/23/2018), 200 mg (09/16/2018), 200 mg (09/17/2018), 200 mg (09/18/2018), 200 mg (10/07/2018), 200 mg (10/08/2018), 200 mg (10/09/2018), 200 mg (10/28/2018), 200 mg (10/29/2018), 200 mg (10/30/2018) fosaprepitant (EMEND) 150 mg, dexamethasone (DECADRON) 12 mg in sodium chloride 0.9 % 145 mL IVPB, , Intravenous,  Once, 4 of 4 cycles Administration:  (08/23/2018),  (09/16/2018),  (10/07/2018),  (10/28/2018) durvalumab (IMFINZI) 1,500 mg  in sodium chloride 0.9 % 100 mL chemo infusion, 1,500 mg (100 % of original dose 1,500 mg), Intravenous,  Once, 3 of 3 cycles Dose modification: 1,500 mg (original dose 1,500 mg, Cycle 2, Reason: Provider  Judgment) Administration: 1,500 mg (09/16/2018), 1,500 mg (10/07/2018), 1,500 mg (10/28/2018)  for chemotherapy treatment.    08/26/2018 Initial Diagnosis   Small cell carcinoma of lung, right ([place])   11/20/2018 -  Chemotherapy   The patient had durvalumab (IMFINZI) 1,500 mg in sodium chloride 0.9 % 100 mL chemo infusion, 1,500 mg (100 % of original dose 1,500 mg), Intravenous,  Once, 4 of 9 cycles Dose modification: 1,500 mg (original dose 1,500 mg, Cycle 1, Reason: Other (see comments)) Administration: 1,500 mg (11/20/2018), 1,500 mg (12/19/2018), 1,500 mg (01/16/2019), 1,500 mg (02/13/2019)  for chemotherapy treatment.       CANCER STAGING: Cancer Staging No matching staging information was found for the patient.   INTERVAL HISTORY:  [name] 73 y.o. male seen for follow-up of his small cell lung cancer.  He reports 1 fall since last visit.  He apparently was trying to get out of the bed and felt lightheaded and fell.  No injuries reported.  He is drinking 1 to 2 cans of boost per day and eating 3 meals per day.  However he lost few pounds.  He mostly lost weight since we discontinued prednisone.  Denies any new pains.  Appetite is 75%.  Energy levels are 50%.  He does report constipation alternating with diarrhea which is stable.  REVIEW OF SYSTEMS:  Review of Systems  Gastrointestinal: Positive for constipation and diarrhea.  Neurological: Positive for dizziness.  Psychiatric/Behavioral: Positive for sleep disturbance.  All other systems reviewed and are negative.    PAST MEDICAL/SURGICAL HISTORY:  Past Medical History:  Diagnosis Date  . Actinic keratosis   . Arthritis   . Colitis MAY 2012    CT ABD/PELVIS HEP FLEXURE  . COPD (chronic obstructive pulmonary disease) ([place])   . Diabetes mellitus without complication ([place])   . History of kidney stones   . Hyperlipemia   . Hypertension   . NSAID long-term use NAPROXEN FOR OA  . SCL Ca dx'd 08/2018   Past Surgical History:   Procedure Laterality Date  . BACK SURGERY     spinal stenosis  . BASAL CELL CARCINOMA EXCISION  FACE, arms feet, leg  . CHOLECYSTECTOMY  JUNE 2011 MJ   STONES, PANCREATITIS  . KNEE ARTHROSCOPY WITH MEDIAL MENISECTOMY Right 03/24/2015   Procedure: KNEE ARTHROSCOPY WITH MEDIAL MENISECTOMY;  Surgeon: Carole Civil, MD;  Location: [facility];  Service: Orthopedics;  Laterality: Right;  . KNEE SURGERY Left LEFT   arthroscopy  . LITHOTRIPSY  80s  . PORTACATH PLACEMENT Left 09/11/2018   Procedure: INSERTION PORT-A-CATH;  Surgeon: Virl Cagey, MD;  Location: [facility];  Service: General;  Laterality: Left;  . SPINE SURGERY       SOCIAL HISTORY:  Social History   Socioeconomic History  . Marital status: Divorced    Spouse name: Not on file  . Number of children: 0  . Years of education: Not on file  . Highest education level: Not on file  Occupational History  . Occupation: [place] [employer]   . Occupation: Manufacturing engineer   Tobacco Use  . Smoking status: Former Smoker    Packs/day: 1.00    Years: 50.00    Pack years: 50.00    Types: Cigarettes    Quit date: 08/07/2018  Years since quitting: 1.1  . Smokeless tobacco: Never Used  Substance and Sexual Activity  . Alcohol use: Yes    Comment: once a month  . Drug use: No  . Sexual activity: Never    Birth control/protection: None  Other Topics Concern  . 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   Social Determinants of Health   Financial Resource Strain:   . Difficulty of Paying Living Expenses: Not on file  Food Insecurity:   . Worried About Charity fundraiser in the Last Year: Not on file  . Ran Out of Food in the Last Year: Not on file  Transportation Needs:   . Lack of Transportation (Medical): Not on file  . Lack of Transportation (Non-Medical): Not on file  Physical Activity:   . Days of Exercise per Week: Not on  file  . Minutes of Exercise per Session: Not on file  Stress:   . Feeling of Stress : Not on file  Social Connections:   . Frequency of Communication with Friends and Family: Not on file  . Frequency of Social Gatherings with Friends and Family: Not on file  . Attends Religious Services: Not on file  . Active Member of Clubs or Organizations: Not on file  . Attends Archivist Meetings: Not on file  . Marital Status: Not on file  Intimate Partner Violence:   . Fear of Current or Ex-Partner: Not on file  . Emotionally Abused: Not on file  . Physically Abused: Not on file  . Sexually Abused: Not on file    FAMILY HISTORY:  Family History  Problem Relation Age of Onset  . Cancer Mother        lung  . Cancer Father        lung and liver  . Colon polyps Neg Hx   . Colon cancer Neg Hx     CURRENT MEDICATIONS:  Outpatient Encounter Medications as of 09/23/2019  Medication Sig Note  . aspirin 81 MG tablet Take 81 mg by mouth daily.     . Cholecalciferol 50 MCG (2000 UT) TBDP Take 1 tablet by mouth daily.   Marland Kitchen docusate sodium (COLACE) 100 MG capsule Take 100 mg by mouth 2 (two) times daily.   Hunt Oris (IMFINZI IV) Inject into the vein every 21 ( twenty-one) days. 03/24/2019: Patient states that he was scheduled for injection last week but it was cancelled. Patient is due for injection for 03/2019  . fluorouracil (EFUDEX) 5 % cream APP AA OF SKIN ON BOTH ARMS BID FOR 3 WEEKS   . FLUoxetine (PROZAC) 20 MG capsule Take 20 mg by mouth every morning.   Marland Kitchen levothyroxine (SYNTHROID) 150 MCG tablet Take 150 mcg by mouth daily.   . meclizine (ANTIVERT) 25 MG tablet Take 25 mg by mouth 3 (three) times daily.   . metFORMIN (GLUCOPHAGE) 500 MG tablet Take 500 mg by mouth 2 (two) times daily with a meal.   . mirtazapine (REMERON) 15 MG tablet Take 1 tablet (15 mg total) by mouth at bedtime.   . Multiple Vitamin (MULTIVITAMIN) tablet Take 1 tablet by mouth daily.   . rosuvastatin  (CRESTOR) 10 MG tablet Take 10 mg by mouth daily.     . traZODone (DESYREL) 50 MG tablet Take 50 mg by mouth at bedtime.   . verapamil (COVERA HS) 240  MG (CO) 24 hr tablet Take 120 mg by mouth at bedtime.    Marland Kitchen albuterol (PROVENTIL HFA;VENTOLIN HFA) 108 (90 Base) MCG/ACT inhaler Inhale 2 puffs into the lungs every 6 (six) hours as needed for wheezing or shortness of breath.    . [DISCONTINUED] levothyroxine (SYNTHROID) 200 MCG tablet Take 200 mcg by mouth daily.   . [DISCONTINUED] verapamil (CALAN-SR) 240 MG CR tablet Take 240 mg by mouth daily.    No facility-administered encounter medications on file as of 09/23/2019.    ALLERGIES:  No Known Allergies   PHYSICAL EXAM:  ECOG Performance status: 1  Vitals:   09/23/19 1205  BP: 124/69  Pulse: 76  Resp: 18  Temp: (!) 97.3 F (36.3 C)  SpO2: 100%   Filed Weights   09/23/19 1205  Weight: 155 lb 12.8 oz (70.7 kg)    Physical Exam Vitals reviewed.  Constitutional:      Appearance: Normal appearance.  Cardiovascular:     Rate and Rhythm: Normal rate and regular rhythm.     Heart sounds: Normal heart sounds.  Pulmonary:     Effort: Pulmonary effort is normal.     Breath sounds: Normal breath sounds.  Abdominal:     General: There is no distension.     Palpations: Abdomen is soft. There is no mass.  Musculoskeletal:        General: No swelling.  Lymphadenopathy:     Cervical: No cervical adenopathy.  Skin:    General: Skin is warm.  Neurological:     General: No focal deficit present.     Mental Status: He is alert and oriented to person, place, and time.  Psychiatric:        Mood and Affect: Mood normal.        Behavior: Behavior normal.      LABORATORY DATA:  I have reviewed the labs as listed.  CBC    Component Value Date/Time   WBC 9.2 08/29/2019 1044   RBC 4.22 08/29/2019 1044   HGB 13.5 08/29/2019 1044   HCT 41.5 08/29/2019 1044   PLT 289 08/29/2019 1044   MCV 98.3 08/29/2019 1044   MCH 32.0  08/29/2019 1044   MCHC 32.5 08/29/2019 1044   RDW 13.8 08/29/2019 1044   LYMPHSABS 1.4 08/29/2019 1044   MONOABS 0.6 08/29/2019 1044   EOSABS 0.3 08/29/2019 1044   BASOSABS 0.0 08/29/2019 1044   CMP Latest Ref Rng & Units 08/29/2019 05/22/2019 04/22/2019  Glucose 70 - 99 mg/dL 98 94 170(H)  BUN 8 - 23 mg/dL 14 12 16   Creatinine 0.61 - 1.24 mg/dL 0.67 0.80 0.83  Sodium 135 - 145 mmol/L 140 139 139  Potassium 3.5 - 5.1 mmol/L 5.2(H) 5.0 4.5  Chloride 98 - 111 mmol/L 101 102 102  CO2 22 - 32 mmol/L 30 29 26   Calcium 8.9 - 10.3 mg/dL 9.8 9.4 9.7  Total Protein 6.5 - 8.1 g/dL 6.7 6.8 6.4(L)  Total Bilirubin 0.3 - 1.2 mg/dL 0.6 0.3 0.8  Alkaline Phos 38 - 126 U/L 71 97 86  AST 15 - 41 U/L 17 14(L) 23  ALT 0 - 44 U/L 15 10 14        DIAGNOSTIC IMAGING:  I have independently reviewed the scans and discussed with the patient.      ASSESSMENT & PLAN:   Small cell carcinoma of lung, right ([place]) 1.  Extensive stage small cell lung cancer: -4 cycles of carboplatin, VP-16 and durvalumab from 08/23/2018 through 10/28/2018. -Durvalumab  maintenance, last dose on 02/13/2019, subsequent doses held due to colitis. -CT CAP on 08/29/2019 reviewed by me showed continued decrease in size of the index prevascular mediastinal lymph node.  No mediastinal or hilar adenopathy.  No specific findings to suggest progression of disease.  Small nonsolid nodule in the left lower lobe measuring 1.2 cm which is nonspecific.  Bilateral small pleural effusions, right more than left, small pericardial effusion also unchanged. -We reviewed results of the brain MRI which did not show any evidence of metastatic disease. -He will be scheduled for a repeat CT scan in 3 months.  2.  Metastatic disease to the brain: -He had 3 small subcentimeter metastasis in the right insula, right occipital lobe and right cerebellar hemisphere, treated with whole brain RT from 09/02/2018 through 09/13/2018. -He reported occasional falls.   Last 1 was in the last couple of weeks when he got lightheaded after getting off of bed.  I have suggested him to sit by the side of the bed few minutes before getting up. -We reviewed MRI of the brain with and without contrast dated 09/18/2019 which did not show any evidence of intracranial metastasis.  Diffuse white matter disease likely from combination of radiation and chronic ischemic microangiopathy.  3.  Colitis: -He experienced diarrhea with blood when he was on durvalumab and was treated with prednisone in the first week of August 2020.  This was gradually tapered off around 04/27/2019. -Denies any diarrhea with blood in it.  He has a diarrhea alternating with constipation as his baseline at this time.      Orders placed this encounter:  Orders Placed This Encounter  Procedures  . CT Chest W Contrast      Derek Jack, MD [place] [phone]

## 2019-09-23 NOTE — Assessment & Plan Note (Signed)
1.  Extensive stage small cell lung cancer: -4 cycles of carboplatin, VP-16 and durvalumab from 08/23/2018 through 10/28/2018. -Durvalumab maintenance, last dose on 02/13/2019, subsequent doses held due to colitis. -CT CAP on 08/29/2019 reviewed by me showed continued decrease in size of the index prevascular mediastinal lymph node.  No mediastinal or hilar adenopathy.  No specific findings to suggest progression of disease.  Small nonsolid nodule in the left lower lobe measuring 1.2 cm which is nonspecific.  Bilateral small pleural effusions, right more than left, small pericardial effusion also unchanged. -We reviewed results of the brain MRI which did not show any evidence of metastatic disease. -He will be scheduled for a repeat CT scan in 3 months.  2.  Metastatic disease to the brain: -He had 3 small subcentimeter metastasis in the right insula, right occipital lobe and right cerebellar hemisphere, treated with whole brain RT from 09/02/2018 through 09/13/2018. -He reported occasional falls.  Last 1 was in the last couple of weeks when he got lightheaded after getting off of bed.  I have suggested him to sit by the side of the bed few minutes before getting up. -We reviewed MRI of the brain with and without contrast dated 09/18/2019 which did not show any evidence of intracranial metastasis.  Diffuse white matter disease likely from combination of radiation and chronic ischemic microangiopathy.  3.  Colitis: -He experienced diarrhea with blood when he was on durvalumab and was treated with prednisone in the first week of August 2020.  This was gradually tapered off around 04/27/2019. -Denies any diarrhea with blood in it.  He has a diarrhea alternating with constipation as his baseline at this time.

## 2019-09-23 NOTE — Patient Instructions (Addendum)
[facility] at [facility] Discharge Instructions  You were seen today by Dr. Katragadda. He went over your recent lab results. He will see you back in 3 months for labs and follow up.   Thank you for choosing [facility] at [facility] to provide your oncology and hematology care.  To afford each patient quality time with our provider, please arrive at least 15 minutes before your scheduled appointment time.   If you have a lab appointment with the Cancer Center please come in thru the  Main Entrance and check in at the main information desk  You need to re-schedule your appointment should you arrive 10 or more minutes late.  We strive to give you quality time with our providers, and arriving late affects you and other patients whose appointments are after yours.  Also, if you no show three or more times for appointments you may be dismissed from the clinic at the providers discretion.     Again, thank you for choosing [facility].  Our hope is that these requests will decrease the amount of time that you wait before being seen by our physicians.       _____________________________________________________________  Should you have questions after your visit to [facility], please contact our office at [phone] between the hours of 8:00 a.m. and 4:30 p.m.  Voicemails left after 4:00 p.m. will not be returned until the following business day.  For prescription refill requests, have your pharmacy contact our office and allow 72 hours.    Cancer Center Support Programs:   > Cancer Support Group  2nd Tuesday of the month 1pm-2pm, Journey Room    

## 2019-11-24 ENCOUNTER — Other Ambulatory Visit: Payer: Self-pay | Admitting: Radiation Therapy

## 2019-11-24 ENCOUNTER — Telehealth: Payer: Self-pay | Admitting: Radiation Therapy

## 2019-11-24 DIAGNOSIS — C7931 Secondary malignant neoplasm of brain: Secondary | ICD-10-CM

## 2019-11-24 NOTE — Addendum Note (Signed)
Addended by: Pincus Large on: 11/24/2019 11:29 AM   Modules accepted: Orders

## 2019-11-24 NOTE — Telephone Encounter (Signed)
Spoke with pt about his upcoming brain MRI appointment in June. He has the information recorded and knows that the follow-up to review the results with Shona Simpson, PA-C is a virtual visit, not in person.   Mont Dutton R.T.(R)(T) Radiation Special Procedures Navigator

## 2019-11-24 NOTE — Addendum Note (Signed)
Addended by: Pincus Large on: 11/24/2019 11:28 AM   Modules accepted: Orders

## 2019-11-28 ENCOUNTER — Other Ambulatory Visit: Payer: Self-pay

## 2019-11-28 ENCOUNTER — Inpatient Hospital Stay (HOSPITAL_COMMUNITY): Payer: Medicare Other | Attending: Hematology

## 2019-11-28 DIAGNOSIS — C3491 Malignant neoplasm of unspecified part of right bronchus or lung: Secondary | ICD-10-CM

## 2019-11-28 DIAGNOSIS — C3490 Malignant neoplasm of unspecified part of unspecified bronchus or lung: Secondary | ICD-10-CM | POA: Diagnosis present

## 2019-11-28 LAB — CBC WITH DIFFERENTIAL/PLATELET
Abs Immature Granulocytes: 0.04 10*3/uL (ref 0.00–0.07)
Basophils Absolute: 0 10*3/uL (ref 0.0–0.1)
Basophils Relative: 0 %
Eosinophils Absolute: 0.1 10*3/uL (ref 0.0–0.5)
Eosinophils Relative: 1 %
HCT: 41.3 % (ref 39.0–52.0)
Hemoglobin: 13.5 g/dL (ref 13.0–17.0)
Immature Granulocytes: 0 %
Lymphocytes Relative: 22 %
Lymphs Abs: 2 10*3/uL (ref 0.7–4.0)
MCH: 32.8 pg (ref 26.0–34.0)
MCHC: 32.7 g/dL (ref 30.0–36.0)
MCV: 100.2 fL — ABNORMAL HIGH (ref 80.0–100.0)
Monocytes Absolute: 0.6 10*3/uL (ref 0.1–1.0)
Monocytes Relative: 6 %
Neutro Abs: 6.2 10*3/uL (ref 1.7–7.7)
Neutrophils Relative %: 71 %
Platelets: 210 10*3/uL (ref 150–400)
RBC: 4.12 MIL/uL — ABNORMAL LOW (ref 4.22–5.81)
RDW: 15.7 % — ABNORMAL HIGH (ref 11.5–15.5)
WBC: 8.9 10*3/uL (ref 4.0–10.5)
nRBC: 0 % (ref 0.0–0.2)

## 2019-11-28 LAB — COMPREHENSIVE METABOLIC PANEL
ALT: 12 U/L (ref 0–44)
AST: 16 U/L (ref 15–41)
Albumin: 3.9 g/dL (ref 3.5–5.0)
Alkaline Phosphatase: 69 U/L (ref 38–126)
Anion gap: 9 (ref 5–15)
BUN: 16 mg/dL (ref 8–23)
CO2: 29 mmol/L (ref 22–32)
Calcium: 9.1 mg/dL (ref 8.9–10.3)
Chloride: 102 mmol/L (ref 98–111)
Creatinine, Ser: 0.92 mg/dL (ref 0.61–1.24)
GFR calc Af Amer: >60 mL/min (ref >60)
GFR calc non Af Amer: >60 mL/min (ref >60)
Glucose, Bld: 89 mg/dL (ref 70–99)
Potassium: 4 mmol/L (ref 3.5–5.1)
Sodium: 140 mmol/L (ref 135–145)
Total Bilirubin: 0.6 mg/dL (ref 0.3–1.2)
Total Protein: 6.7 g/dL (ref 6.5–8.1)

## 2019-12-05 ENCOUNTER — Ambulatory Visit (HOSPITAL_COMMUNITY)
Admission: RE | Admit: 2019-12-05 | Discharge: 2019-12-05 | Disposition: A | Discharge status: Home / Self Care | Payer: Medicare Other | Source: Ambulatory Visit | Attending: Hematology | Admitting: Hematology

## 2019-12-05 ENCOUNTER — Other Ambulatory Visit: Payer: Self-pay

## 2019-12-05 DIAGNOSIS — C3491 Malignant neoplasm of unspecified part of right bronchus or lung: Secondary | ICD-10-CM | POA: Insufficient documentation

## 2019-12-05 MED ORDER — IOHEXOL 300 MG/ML  SOLN
100.0000 mL | Freq: Once | INTRAMUSCULAR | Status: AC | PRN
  Administered 2019-12-05: 100 mL via INTRAVENOUS

## 2019-12-08 ENCOUNTER — Encounter (HOSPITAL_COMMUNITY): Payer: Self-pay | Admitting: Hematology

## 2019-12-08 ENCOUNTER — Inpatient Hospital Stay (HOSPITAL_COMMUNITY): Payer: Medicare Other | Attending: Hematology | Admitting: Hematology

## 2019-12-08 ENCOUNTER — Other Ambulatory Visit: Payer: Self-pay

## 2019-12-08 VITALS — BP 135/88 | HR 84 | Temp 97.8°F | Resp 17 | Wt 178.3 lb

## 2019-12-08 DIAGNOSIS — C7931 Secondary malignant neoplasm of brain: Secondary | ICD-10-CM | POA: Insufficient documentation

## 2019-12-08 DIAGNOSIS — E119 Type 2 diabetes mellitus without complications: Secondary | ICD-10-CM | POA: Insufficient documentation

## 2019-12-08 DIAGNOSIS — E785 Hyperlipidemia, unspecified: Secondary | ICD-10-CM | POA: Diagnosis not present

## 2019-12-08 DIAGNOSIS — Z7982 Long term (current) use of aspirin: Secondary | ICD-10-CM | POA: Insufficient documentation

## 2019-12-08 DIAGNOSIS — I10 Essential (primary) hypertension: Secondary | ICD-10-CM | POA: Insufficient documentation

## 2019-12-08 DIAGNOSIS — Z7984 Long term (current) use of oral hypoglycemic drugs: Secondary | ICD-10-CM | POA: Insufficient documentation

## 2019-12-08 DIAGNOSIS — C3491 Malignant neoplasm of unspecified part of right bronchus or lung: Secondary | ICD-10-CM | POA: Insufficient documentation

## 2019-12-08 DIAGNOSIS — Z79899 Other long term (current) drug therapy: Secondary | ICD-10-CM | POA: Insufficient documentation

## 2019-12-08 DIAGNOSIS — M1990 Unspecified osteoarthritis, unspecified site: Secondary | ICD-10-CM | POA: Diagnosis not present

## 2019-12-08 DIAGNOSIS — J449 Chronic obstructive pulmonary disease, unspecified: Secondary | ICD-10-CM | POA: Insufficient documentation

## 2019-12-08 DIAGNOSIS — Z87891 Personal history of nicotine dependence: Secondary | ICD-10-CM | POA: Insufficient documentation

## 2019-12-08 DIAGNOSIS — C771 Secondary and unspecified malignant neoplasm of intrathoracic lymph nodes: Secondary | ICD-10-CM | POA: Insufficient documentation

## 2019-12-08 NOTE — Patient Instructions (Signed)
[place] at [facility] Discharge Instructions  You were seen today by Dr. Delton Coombes. He went over your recent lab results. He will see you back in 3 months for labs, scans and follow up.   Thank you for choosing [place] at [facility] to provide your oncology and hematology care.  To afford each patient quality time with our provider, please arrive at least 15 minutes before your scheduled appointment time.   If you have a lab appointment with the [facility] please come in thru the  Main Entrance and check in at the main information desk  You need to re-schedule your appointment should you arrive 10 or more minutes late.  We strive to give you quality time with our providers, and arriving late affects you and other patients whose appointments are after yours.  Also, if you no show three or more times for appointments you may be dismissed from the clinic at the providers discretion.     Again, thank you for choosing [facility].  Our hope is that these requests will decrease the amount of time that you wait before being seen by our physicians.       _____________________________________________________________  Should you have questions after your visit to [facility], please contact our office at [phone] between the hours of 8:00 a.m. and 4:30 p.m.  Voicemails left after 4:00 p.m. will not be returned until the following business day.  For prescription refill requests, have your pharmacy contact our office and allow 72 hours.    Cancer Center Support Programs:   > Cancer Support Group  2nd Tuesday of the month 1pm-2pm, Journey Room

## 2019-12-08 NOTE — Progress Notes (Signed)
[name], [place] [address]   CLINIC:  Medical Oncology/Hematology  PCP:  Lucia Gaskins, [place] [address] [phone]   REASON FOR VISIT:  Follow-up for extensive stage small cell lung cancer with brain metastasis   CURRENT THERAPY:Durvalumab every 4 weeks.   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[place])  08/22/2018 Initial Diagnosis   Small cell carcinoma of lung, right ([place])   08/23/2018 - 11/17/2018 Chemotherapy   The patient had palonosetron (ALOXI) injection 0.25 mg, 0.25 mg, Intravenous,  Once, 4 of 4 cycles Administration: 0.25 mg (08/23/2018), 0.25 mg (09/16/2018), 0.25 mg (10/07/2018), 0.25 mg (10/28/2018) pegfilgrastim-cbqv (UDENYCA) injection 6 mg, 6 mg, Subcutaneous, Once, 4 of 4 cycles Administration: 6 mg (08/27/2018), 6 mg (09/20/2018), 6 mg (10/11/2018), 6 mg (11/01/2018) CARBOplatin (PARAPLATIN) 560 mg in sodium chloride 0.9 % 250 mL chemo infusion, 560 mg (100 % of original dose 564 mg), Intravenous,  Once, 4 of 4 cycles Dose modification:   (original dose 564 mg, Cycle 1),   (original dose 523.5 mg, Cycle 3),   (original dose 523.5 mg, Cycle 4) Administration: 560 mg (08/23/2018), 560 mg (09/16/2018), 520 mg (10/07/2018), 510 mg (10/28/2018) etoposide (VEPESID) 220 mg in sodium chloride 0.9 % 1,000 mL chemo infusion, 100 mg/m2 = 220 mg, Intravenous,  Once, 4 of 4 cycles Administration: 220 mg (08/23/2018), 200 mg (09/16/2018), 200 mg (09/17/2018), 200 mg (09/18/2018), 200 mg (10/07/2018), 200 mg (10/08/2018), 200 mg (10/09/2018), 200 mg (10/28/2018), 200 mg (10/29/2018), 200 mg (10/30/2018) fosaprepitant (EMEND) 150 mg, dexamethasone (DECADRON) 12 mg in sodium chloride 0.9 % 145 mL IVPB, , Intravenous,  Once, 4 of 4 cycles Administration:  (08/23/2018),  (09/16/2018),  (10/07/2018),  (10/28/2018) durvalumab (IMFINZI) 1,500 mg in sodium chloride 0.9  % 100 mL chemo infusion, 1,500 mg (100 % of original dose 1,500 mg), Intravenous,  Once, 3 of 3 cycles Dose modification: 1,500 mg (original dose 1,500 mg, Cycle 2, Reason: Provider Judgment) Administration: 1,500 mg (09/16/2018), 1,500 mg (10/07/2018), 1,500 mg (10/28/2018)  for chemotherapy treatment.    Small cell carcinoma of lung, right ([place])  08/23/2018 - 11/17/2018 Chemotherapy   The patient had palonosetron (ALOXI) injection 0.25 mg, 0.25 mg, Intravenous,  Once, 4 of 4 cycles Administration: 0.25 mg (08/23/2018), 0.25 mg (09/16/2018), 0.25 mg (10/07/2018), 0.25 mg (10/28/2018) pegfilgrastim-cbqv (UDENYCA) injection 6 mg, 6 mg, Subcutaneous, Once, 4 of 4 cycles Administration: 6 mg (08/27/2018), 6 mg (09/20/2018), 6 mg (10/11/2018), 6 mg (11/01/2018) CARBOplatin (PARAPLATIN) 560 mg in sodium chloride 0.9 % 250 mL chemo infusion, 560 mg (100 % of original dose 564 mg), Intravenous,  Once, 4 of 4 cycles Dose modification:   (original dose 564 mg, Cycle 1),   (original dose 523.5 mg, Cycle 3),   (original dose 523.5 mg, Cycle 4) Administration: 560 mg (08/23/2018), 560 mg (09/16/2018), 520 mg (10/07/2018), 510 mg (10/28/2018) etoposide (VEPESID) 220 mg in sodium chloride 0.9 % 1,000 mL chemo infusion, 100 mg/m2 = 220 mg, Intravenous,  Once, 4 of 4 cycles Administration: 220 mg (08/23/2018), 200 mg (09/16/2018), 200 mg (09/17/2018), 200 mg (09/18/2018), 200 mg (10/07/2018), 200 mg (10/08/2018), 200 mg (10/09/2018), 200 mg (10/28/2018), 200 mg (10/29/2018), 200 mg (10/30/2018) fosaprepitant (EMEND) 150 mg, dexamethasone (DECADRON) 12 mg in sodium chloride 0.9 % 145 mL IVPB, , Intravenous,  Once, 4 of 4 cycles Administration:  (08/23/2018),  (09/16/2018),  (10/07/2018),  (10/28/2018) durvalumab (IMFINZI) 1,500 mg  in sodium chloride 0.9 % 100 mL chemo infusion, 1,500 mg (100 % of original dose 1,500 mg), Intravenous,  Once, 3 of 3 cycles Dose modification: 1,500 mg (original dose 1,500 mg, Cycle 2, Reason: Provider  Judgment) Administration: 1,500 mg (09/16/2018), 1,500 mg (10/07/2018), 1,500 mg (10/28/2018)  for chemotherapy treatment.    08/26/2018 Initial Diagnosis   Small cell carcinoma of lung, right ([place])   11/20/2018 -  Chemotherapy   The patient had durvalumab (IMFINZI) 1,500 mg in sodium chloride 0.9 % 100 mL chemo infusion, 1,500 mg (100 % of original dose 1,500 mg), Intravenous,  Once, 4 of 9 cycles Dose modification: 1,500 mg (original dose 1,500 mg, Cycle 1, Reason: Other (see comments)) Administration: 1,500 mg (11/20/2018), 1,500 mg (12/19/2018), 1,500 mg (01/16/2019), 1,500 mg (02/13/2019)  for chemotherapy treatment.       CANCER STAGING: Cancer Staging No matching staging information was found for the patient.   INTERVAL HISTORY:  [name] 74 y.o. male seen for follow-up of small cell lung cancer.  Appetite is 100%.  Energy levels are 50%.  He has gained close to 20 pounds since last visit.  Denies any new onset pains.  Denies any headaches or vision changes.  Reports some sleep disturbance.  Denies any fevers, night sweats or infections in the past 2 months.  REVIEW OF SYSTEMS:  Review of Systems  Psychiatric/Behavioral: Positive for sleep disturbance.  All other systems reviewed and are negative.    PAST MEDICAL/SURGICAL HISTORY:  Past Medical History:  Diagnosis Date  . Actinic keratosis   . Arthritis   . Colitis MAY 2012    CT ABD/PELVIS HEP FLEXURE  . COPD (chronic obstructive pulmonary disease) ([place])   . Diabetes mellitus without complication ([place])   . History of kidney stones   . Hyperlipemia   . Hypertension   . NSAID long-term use NAPROXEN FOR OA  . SCL Ca dx'd 08/2018   Past Surgical History:  Procedure Laterality Date  . BACK SURGERY     spinal stenosis  . BASAL CELL CARCINOMA EXCISION  FACE, arms feet, leg  . CHOLECYSTECTOMY  JUNE 2011 MJ   STONES, PANCREATITIS  . KNEE ARTHROSCOPY WITH MEDIAL MENISECTOMY Right 03/24/2015   Procedure: KNEE ARTHROSCOPY WITH  MEDIAL MENISECTOMY;  Surgeon: Carole Civil, MD;  Location: [facility];  Service: Orthopedics;  Laterality: Right;  . KNEE SURGERY Left LEFT   arthroscopy  . LITHOTRIPSY  80s  . PORTACATH PLACEMENT Left 09/11/2018   Procedure: INSERTION PORT-A-CATH;  Surgeon: Virl Cagey, MD;  Location: [facility];  Service: General;  Laterality: Left;  . SPINE SURGERY       SOCIAL HISTORY:  Social History   Socioeconomic History  . Marital status: Divorced    Spouse name: Not on file  . Number of children: 0  . Years of education: Not on file  . Highest education level: Not on file  Occupational History  . Occupation: [place] [employer]   . Occupation: Manufacturing engineer   Tobacco Use  . Smoking status: Former Smoker    Packs/day: 1.00    Years: 50.00    Pack years: 50.00    Types: Cigarettes    Quit date: 08/07/2018    Years since quitting: 1.3  . Smokeless tobacco: Never Used  Substance and Sexual Activity  . Alcohol use: Yes    Comment: once a month  . Drug use: No  . Sexual activity: Never    Birth control/protection: None  Other Topics Concern  .  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   Social Determinants of Health   Financial Resource Strain:   . Difficulty of Paying Living Expenses:   Food Insecurity:   . Worried About Charity fundraiser in the Last Year:   . Arboriculturist in the Last Year:   Transportation Needs:   . Film/video editor (Medical):   Marland Kitchen Lack of Transportation (Non-Medical):   Physical Activity:   . Days of Exercise per Week:   . Minutes of Exercise per Session:   Stress:   . Feeling of Stress :   Social Connections:   . Frequency of Communication with Friends and Family:   . Frequency of Social Gatherings with Friends and Family:   . Attends Religious Services:   . Active Member of Clubs or Organizations:   . Attends Archivist Meetings:    Marland Kitchen Marital Status:   Intimate Partner Violence:   . Fear of Current or Ex-Partner:   . Emotionally Abused:   Marland Kitchen Physically Abused:   . Sexually Abused:     FAMILY HISTORY:  Family History  Problem Relation Age of Onset  . Cancer Mother        lung  . Cancer Father        lung and liver  . Colon polyps Neg Hx   . Colon cancer Neg Hx     CURRENT MEDICATIONS:  Outpatient Encounter Medications as of 12/08/2019  Medication Sig Note  . aspirin 81 MG tablet Take 81 mg by mouth daily.     . Cholecalciferol 50 MCG (2000 UT) TBDP Take 1 tablet by mouth daily.   Marland Kitchen docusate sodium (COLACE) 100 MG capsule Take 100 mg by mouth 2 (two) times daily.   [place]is (IMFINZI IV) Inject into the vein every 21 ( twenty-one) days. 03/24/2019: Patient states that he was scheduled for injection last week but it was cancelled. Patient is due for injection for 03/2019  . fluorouracil (EFUDEX) 5 % cream APP AA OF SKIN ON BOTH ARMS BID FOR 3 WEEKS   . FLUoxetine (PROZAC) 20 MG capsule Take 20 mg by mouth every morning.   Marland Kitchen levothyroxine (SYNTHROID) 150 MCG tablet Take 150 mcg by mouth daily.   . metFORMIN (GLUCOPHAGE) 500 MG tablet Take 500 mg by mouth 2 (two) times daily with a meal.   . mirtazapine (REMERON) 15 MG tablet Take 1 tablet (15 mg total) by mouth at bedtime.   . rosuvastatin (CRESTOR) 10 MG tablet Take 10 mg by mouth daily.     . traZODone (DESYREL) 50 MG tablet Take 50 mg by mouth at bedtime.   . verapamil (COVERA HS) 240 MG (CO) 24 hr tablet Take 120 mg by mouth at bedtime.    . [DISCONTINUED] Multiple Vitamin (MULTIVITAMIN) tablet Take 1 tablet by mouth daily.   Marland Kitchen albuterol (PROVENTIL HFA;VENTOLIN HFA) 108 (90 Base) MCG/ACT inhaler Inhale 2 puffs into the lungs every 6 (six) hours as needed for wheezing or shortness of breath.    . meclizine (ANTIVERT) 25 MG tablet Take 25 mg by mouth 3 (three) times daily.    No facility-administered encounter medications on file as of 12/08/2019.     ALLERGIES:  No Known Allergies   PHYSICAL EXAM:  ECOG Performance status: 1  Vitals:   12/08/19 1455  BP: 135/88  Pulse:  84  Resp: 17  Temp: 97.8 F (36.6 C)  SpO2: 98%   Filed Weights   12/08/19 1455  Weight: 178 lb 4.8 oz (80.9 kg)    Physical Exam Vitals reviewed.  Constitutional:      Appearance: Normal appearance.  Cardiovascular:     Rate and Rhythm: Normal rate and regular rhythm.     Heart sounds: Normal heart sounds.  Pulmonary:     Effort: Pulmonary effort is normal.     Breath sounds: Normal breath sounds.  Abdominal:     General: There is no distension.     Palpations: Abdomen is soft. There is no mass.  Musculoskeletal:        General: No swelling.  Lymphadenopathy:     Cervical: No cervical adenopathy.  Skin:    General: Skin is warm.  Neurological:     General: No focal deficit present.     Mental Status: He is alert and oriented to person, place, and time.  Psychiatric:        Mood and Affect: Mood normal.        Behavior: Behavior normal.      LABORATORY DATA:  I have reviewed the labs as listed.  CBC    Component Value Date/Time   WBC 8.9 11/28/2019 1100   RBC 4.12 (L) 11/28/2019 1100   HGB 13.5 11/28/2019 1100   HCT 41.3 11/28/2019 1100   PLT 210 11/28/2019 1100   MCV 100.2 (H) 11/28/2019 1100   MCH 32.8 11/28/2019 1100   MCHC 32.7 11/28/2019 1100   RDW 15.7 (H) 11/28/2019 1100   LYMPHSABS 2.0 11/28/2019 1100   MONOABS 0.6 11/28/2019 1100   EOSABS 0.1 11/28/2019 1100   BASOSABS 0.0 11/28/2019 1100   CMP Latest Ref Rng & Units 11/28/2019 08/29/2019 05/22/2019  Glucose 70 - 99 mg/dL 89 98 94  BUN 8 - 23 mg/dL 16 14 12   Creatinine 0.61 - 1.24 mg/dL 0.92 0.67 0.80  Sodium 135 - 145 mmol/L 140 140 139  Potassium 3.5 - 5.1 mmol/L 4.0 5.2(H) 5.0  Chloride 98 - 111 mmol/L 102 101 102  CO2 22 - 32 mmol/L 29 30 29   Calcium 8.9 - 10.3 mg/dL 9.1 9.8 9.4  Total Protein 6.5 - 8.1 g/dL 6.7 6.7 6.8  Total Bilirubin 0.3 - 1.2 mg/dL  0.6 0.6 0.3  Alkaline Phos 38 - 126 U/L 69 71 97  AST 15 - 41 U/L 16 17 14(L)  ALT 0 - 44 U/L 12 15 10        DIAGNOSTIC IMAGING:  I have reviewed scans.   ASSESSMENT & PLAN:   Small cell carcinoma of lung, right ([place]) 1.  Extensive stage small cell lung cancer: -4 cycles of carboplatin, VP-16 and durvalumab from 08/23/2018 through 10/28/2018. -Durvalumab maintenance last dose on 02/13/2019, subsequent doses held due to colitis. -We reviewed results and images of the CT scan dated 12/05/2019.  Bilateral pleural effusions have improved.  No pathologically enlarged lymphadenopathy.  Bilateral perihilar and paramedian fibrosis and consolidation is also stable. -I have reviewed his labs which are grossly within normal limits.  I plan to see him back in 3 months with repeat labs and scans.  2.  Metastatic disease to the brain: -He had 3 small subcentimeter meta stasis in the right insula, right occipital lobe and right cerebellar hemisphere, treated with whole brain RT from 09/02/2018 through 09/13/2018. -MRI of the brain on 09/18/2019 did not show any evidence of intracranial metastasis. -He has another MRI  of the brain scheduled on 01/15/2020.  3.  Colitis: -He experienced diarrhea with blood when he was on durvalumab and was treated with prednisone in the first week of August 2020, gradually tapered off around 04/27/2019. -He does not have any GI symptoms at this time.      Orders placed this encounter:  Orders Placed This Encounter  Procedures  . CT Abdomen Pelvis W Contrast  . CT Chest W Contrast  . CBC with Differential/Platelet  . Comprehensive metabolic panel  . Magnesium  . Lactate dehydrogenase      Derek Jack, MD [place] [phone]

## 2019-12-08 NOTE — Assessment & Plan Note (Signed)
1.  Extensive stage small cell lung cancer: -4 cycles of carboplatin, VP-16 and durvalumab from 08/23/2018 through 10/28/2018. -Durvalumab maintenance last dose on 02/13/2019, subsequent doses held due to colitis. -We reviewed results and images of the CT scan dated 12/05/2019.  Bilateral pleural effusions have improved.  No pathologically enlarged lymphadenopathy.  Bilateral perihilar and paramedian fibrosis and consolidation is also stable. -I have reviewed his labs which are grossly within normal limits.  I plan to see him back in 3 months with repeat labs and scans.  2.  Metastatic disease to the brain: -He had 3 small subcentimeter meta stasis in the right insula, right occipital lobe and right cerebellar hemisphere, treated with whole brain RT from 09/02/2018 through 09/13/2018. -MRI of the brain on 09/18/2019 did not show any evidence of intracranial metastasis. -He has another MRI of the brain scheduled on 01/15/2020.  3.  Colitis: -He experienced diarrhea with blood when he was on durvalumab and was treated with prednisone in the first week of August 2020, gradually tapered off around 04/27/2019. -He does not have any GI symptoms at this time.

## 2020-01-15 ENCOUNTER — Other Ambulatory Visit: Payer: Self-pay

## 2020-01-15 ENCOUNTER — Ambulatory Visit (HOSPITAL_COMMUNITY)
Admission: RE | Admit: 2020-01-15 | Discharge: 2020-01-15 | Disposition: A | Discharge status: Home / Self Care | Payer: Medicare Other | Source: Ambulatory Visit | Attending: Radiation Oncology | Admitting: Radiation Oncology

## 2020-01-15 DIAGNOSIS — C7931 Secondary malignant neoplasm of brain: Secondary | ICD-10-CM | POA: Diagnosis present

## 2020-01-15 MED ORDER — GADOBUTROL 1 MMOL/ML IV SOLN
8.0000 mL | Freq: Once | INTRAVENOUS | Status: AC | PRN
  Administered 2020-01-15: 8 mL via INTRAVENOUS

## 2020-01-16 ENCOUNTER — Telehealth: Payer: Self-pay

## 2020-01-16 NOTE — Telephone Encounter (Signed)
Left voicemail message to call back in regards to telephone visit with John Simpson PA on Monday 01/19/20 at 2:00pm. Meaningful use questions needed to be done prior to office visit. TM

## 2020-01-16 NOTE — Telephone Encounter (Signed)
Left voicemail message to call back in regards to telephone visit with Shona Simpson PA on Monday 01/19/20 at 2:00pm TM

## 2020-01-19 ENCOUNTER — Telehealth: Payer: Self-pay

## 2020-01-19 ENCOUNTER — Inpatient Hospital Stay: Payer: TRICARE For Life (TFL) | Attending: Radiation Oncology

## 2020-01-19 ENCOUNTER — Other Ambulatory Visit: Payer: Self-pay

## 2020-01-19 ENCOUNTER — Encounter: Payer: Self-pay | Admitting: Radiation Oncology

## 2020-01-19 ENCOUNTER — Ambulatory Visit
Admission: RE | Admit: 2020-01-19 | Discharge: 2020-01-19 | Disposition: A | Discharge status: Home / Self Care | Payer: Medicare Other | Source: Ambulatory Visit | Attending: Radiation Oncology | Admitting: Radiation Oncology

## 2020-01-19 DIAGNOSIS — C7931 Secondary malignant neoplasm of brain: Secondary | ICD-10-CM

## 2020-01-19 DIAGNOSIS — J449 Chronic obstructive pulmonary disease, unspecified: Secondary | ICD-10-CM | POA: Insufficient documentation

## 2020-01-19 DIAGNOSIS — E119 Type 2 diabetes mellitus without complications: Secondary | ICD-10-CM | POA: Insufficient documentation

## 2020-01-19 DIAGNOSIS — I2699 Other pulmonary embolism without acute cor pulmonale: Secondary | ICD-10-CM

## 2020-01-19 DIAGNOSIS — C3491 Malignant neoplasm of unspecified part of right bronchus or lung: Secondary | ICD-10-CM

## 2020-01-19 NOTE — Progress Notes (Addendum)
Radiation Oncology         [phone] ________________________________  Outpatient Follow Up - Conducted via telephone due to current COVID-19 concerns for limiting patient exposure  I spoke with the patient to conduct this consult visit via telephone to spare the patient unnecessary potential exposure in the healthcare setting during the current COVID-19 pandemic. The patient was notified in advance and was offered a [place] meeting to allow for face to face communication but unfortunately reported that they did not have the appropriate resources/technology to support such a visit and instead preferred to proceed with a telephone visit.   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 Chest / 9 Gy in 3 fractions 2. Chest Target 1 / 25 Gy in 10 fractionTotal dose 34 Gy 3. Whole Brain / 30 Gy in 10 fractions   Narrative:   In summary this is a pleasant, 74 y.o. gentleman with extensive stage small cell carcinoma of the right lung. He was seen while hospitalized with symptoms of SVC syndrome and was diagnosed with extensive stage disease. He received palliative radiotherapy to the chest along with whole brain radiotherapy due to three metastatic lesions identified during his work up. He's done well since his radiotherapy and has remained without disease in the brain since. He had been taking immunotherapy but developed colitis, so this was discontinued. He continues to follow in surveillance with Dr. Delton Coombes and his recent staging imaging showed no progressive or concerns for active disease. An MRI of the brain on 01/15/20 did not reveal evidence of any active brain disease. He has stable post radiotherapy changes in the cerebral white matter without punctate changes. He's  contacted today to review these results.   On review of systems, the patient reports that he is doing pretty well. He continues to try to remain as active as possible. He denies any difficulty with breathing or chest pain. He is not having any headaches, and reports his previously noted dizziness has resolved. He denies visual or auditory changes otherwise. No other complaints are verbalized.  Past Medical History:  Past Medical History:  Diagnosis Date  . Actinic keratosis   . Arthritis   . Colitis MAY 2012    CT ABD/PELVIS HEP FLEXURE  . COPD (chronic obstructive pulmonary disease) ([place])   . Diabetes mellitus without complication ([place])   . History of kidney stones   . Hyperlipemia   . Hypertension   . NSAID long-term use NAPROXEN FOR OA  . SCL Ca dx'd 08/2018    Past Surgical History: Past Surgical History:  Procedure Laterality Date  . BACK SURGERY     spinal stenosis  . BASAL CELL CARCINOMA EXCISION  FACE, arms feet, leg  . CHOLECYSTECTOMY  JUNE 2011 MJ   STONES, PANCREATITIS  . KNEE ARTHROSCOPY WITH MEDIAL MENISECTOMY Right 03/24/2015   Procedure: KNEE ARTHROSCOPY WITH MEDIAL MENISECTOMY;  Surgeon: Carole Civil, MD;  Location: [facility];  Service: Orthopedics;  Laterality: Right;  . KNEE SURGERY Left LEFT   arthroscopy  . LITHOTRIPSY  80s  . PORTACATH PLACEMENT Left 09/11/2018   Procedure: INSERTION PORT-A-CATH;  Surgeon: Virl Cagey, MD;  Location: [facility];  Service: General;  Laterality: Left;  . SPINE SURGERY      Social History:  Social History   Socioeconomic History  . Marital status: Divorced    Spouse name: Not on file  . Number of children: 0  . Years of education: Not on file  . Highest education level: Not on file  Occupational History  . Occupation: [place] [employer]   . Occupation: Manufacturing engineer   Tobacco Use  . Smoking status: Former Smoker    Packs/day: 1.00    Years: 50.00    Pack years: 50.00    Types: Cigarettes    Quit date: 08/07/2018      Years since quitting: 1.4  . Smokeless tobacco: Never Used  Substance and Sexual Activity  . Alcohol use: Yes    Comment: once a month  . Drug use: No  . Sexual activity: Never    Birth control/protection: None  Other Topics Concern  . 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   Social Determinants of Health   Financial Resource Strain:   . Difficulty of Paying Living Expenses:   Food Insecurity:   . Worried About Charity fundraiser in the Last Year:   . Arboriculturist in the Last Year:   Transportation Needs:   . Film/video editor (Medical):   Marland Kitchen Lack of Transportation (Non-Medical):   Physical Activity:   . Days of Exercise per Week:   . Minutes of Exercise per Session:   Stress:   . Feeling of Stress :   Social Connections:   . Frequency of Communication with Friends and Family:   . Frequency of Social Gatherings with Friends and Family:   . Attends Religious Services:   . Active Member of Clubs or Organizations:   . Attends Archivist Meetings:   Marland Kitchen Marital Status:   Intimate Partner Violence:   . Fear of Current or Ex-Partner:   . Emotionally Abused:   Marland Kitchen Physically Abused:   . Sexually Abused:     Family History: Family History  Problem Relation Age of Onset  . Cancer Mother        lung  . Cancer Father        lung and liver  . Colon polyps Neg Hx   . Colon cancer Neg Hx    Medications: Current Outpatient Medications  Medication Sig Dispense Refill  . albuterol (PROVENTIL HFA;VENTOLIN HFA) 108 (90 Base) MCG/ACT inhaler Inhale 2 puffs into the lungs every 6 (six) hours as needed for wheezing or shortness of breath.     Marland Kitchen aspirin 81 MG tablet Take 81 mg by mouth daily.      . Cholecalciferol 50 MCG (2000 UT) TBDP Take 1 tablet by mouth daily.    Marland Kitchen docusate sodium (COLACE) 100 MG capsule Take 100 mg by mouth 2 (two) times daily.    [place]unt Oris (IMFINZI IV) Inject into the vein every 21 ( twenty-one) days.    . fluorouracil (EFUDEX) 5 % cream APP AA OF SKIN ON BOTH ARMS BID FOR 3 WEEKS    . FLUoxetine (PROZAC) 20 MG capsule Take 20 mg by mouth every morning.    Marland Kitchen levothyroxine (SYNTHROID) 150 MCG tablet Take 150 mcg by mouth daily.    . meclizine (ANTIVERT) 25 MG tablet Take 25 mg by mouth 3 (three) times daily.    . metFORMIN (GLUCOPHAGE) 500 MG tablet Take 500 mg by mouth 2 (two) times  daily with a meal.    . mirtazapine (REMERON) 15 MG tablet Take 1 tablet (15 mg total) by mouth at bedtime. 30 tablet 1  . rosuvastatin (CRESTOR) 10 MG tablet Take 10 mg by mouth daily.      . traZODone (DESYREL) 50 MG tablet Take 50 mg by mouth at bedtime.    . verapamil (COVERA HS) 240 MG (CO) 24 hr tablet Take 120 mg by mouth at bedtime.      No current facility-administered medications for this encounter.    Allergies:  No Known Allergies  Physical Exam:  Unable to assess due to encounter type.   ECOG Status: 0  Impression/Plan: 1. 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 The patient's imaging was reviewed in brain oncology conference and fortunately he does not have any active brain disease. He continues with Dr. Delton Coombes in observation since he was unable to tolerate immunotherapy. We will repeat his next MRI in 4 months time and he will keep [place] informed of any changes in his status. 2. Dizziness. The patient's scans show post radiation changes. His symptoms have resolved but if they return we have already discussed options of evaluation with Dr. Mickeal Skinner.   Given current concerns for patient exposure during the COVID-19 pandemic, this encounter was conducted via telephone.  The patient has given verbal consent for this type of encounter. The time spent during this encounter was 15 minutes and 50% of that time was spent in preparation, discussion, and  in the coordination of his  care. The attendants for this meeting include Shona Simpson, [facility] and Asencion Islam  During the encounter, Shona Simpson [facility] was located remotely at home. TAJI BARRETTO  was located at home.    Carola Rhine, PAC

## 2020-01-19 NOTE — Telephone Encounter (Signed)
Spoke with patient and advised that telephone follow-up appointment is today with Shona Simpson PA at 2:00pm. Pt verbalized understanding of appointment date and time. Meaningful use questions were done. TM

## 2020-03-09 ENCOUNTER — Ambulatory Visit (HOSPITAL_COMMUNITY)
Admission: RE | Admit: 2020-03-09 | Discharge: 2020-03-09 | Disposition: A | Discharge status: Home / Self Care | Payer: Medicare Other | Source: Ambulatory Visit | Attending: Hematology | Admitting: Hematology

## 2020-03-09 ENCOUNTER — Inpatient Hospital Stay (HOSPITAL_COMMUNITY): Payer: Medicare Other | Attending: Hematology

## 2020-03-09 ENCOUNTER — Other Ambulatory Visit: Payer: Self-pay

## 2020-03-09 DIAGNOSIS — E119 Type 2 diabetes mellitus without complications: Secondary | ICD-10-CM | POA: Diagnosis not present

## 2020-03-09 DIAGNOSIS — C771 Secondary and unspecified malignant neoplasm of intrathoracic lymph nodes: Secondary | ICD-10-CM | POA: Insufficient documentation

## 2020-03-09 DIAGNOSIS — I10 Essential (primary) hypertension: Secondary | ICD-10-CM | POA: Diagnosis not present

## 2020-03-09 DIAGNOSIS — Z87891 Personal history of nicotine dependence: Secondary | ICD-10-CM | POA: Insufficient documentation

## 2020-03-09 DIAGNOSIS — Z9221 Personal history of antineoplastic chemotherapy: Secondary | ICD-10-CM | POA: Diagnosis not present

## 2020-03-09 DIAGNOSIS — C7931 Secondary malignant neoplasm of brain: Secondary | ICD-10-CM | POA: Diagnosis not present

## 2020-03-09 DIAGNOSIS — C3491 Malignant neoplasm of unspecified part of right bronchus or lung: Secondary | ICD-10-CM

## 2020-03-09 DIAGNOSIS — Z79899 Other long term (current) drug therapy: Secondary | ICD-10-CM | POA: Insufficient documentation

## 2020-03-09 LAB — CBC WITH DIFFERENTIAL/PLATELET
Abs Immature Granulocytes: 0.05 10*3/uL (ref 0.00–0.07)
Basophils Absolute: 0 10*3/uL (ref 0.0–0.1)
Basophils Relative: 0 %
Eosinophils Absolute: 0.1 10*3/uL (ref 0.0–0.5)
Eosinophils Relative: 1 %
HCT: 43.9 % (ref 39.0–52.0)
Hemoglobin: 14.4 g/dL (ref 13.0–17.0)
Immature Granulocytes: 1 %
Lymphocytes Relative: 20 %
Lymphs Abs: 1.8 10*3/uL (ref 0.7–4.0)
MCH: 32.6 pg (ref 26.0–34.0)
MCHC: 32.8 g/dL (ref 30.0–36.0)
MCV: 99.3 fL (ref 80.0–100.0)
Monocytes Absolute: 0.5 10*3/uL (ref 0.1–1.0)
Monocytes Relative: 6 %
Neutro Abs: 6.5 10*3/uL (ref 1.7–7.7)
Neutrophils Relative %: 72 %
Platelets: 251 10*3/uL (ref 150–400)
RBC: 4.42 MIL/uL (ref 4.22–5.81)
RDW: 15.9 % — ABNORMAL HIGH (ref 11.5–15.5)
WBC: 9.1 10*3/uL (ref 4.0–10.5)
nRBC: 0 % (ref 0.0–0.2)

## 2020-03-09 LAB — COMPREHENSIVE METABOLIC PANEL
ALT: 10 U/L (ref 0–44)
AST: 16 U/L (ref 15–41)
Albumin: 4.5 g/dL (ref 3.5–5.0)
Alkaline Phosphatase: 60 U/L (ref 38–126)
Anion gap: 10 (ref 5–15)
BUN: 15 mg/dL (ref 8–23)
CO2: 28 mmol/L (ref 22–32)
Calcium: 9.5 mg/dL (ref 8.9–10.3)
Chloride: 100 mmol/L (ref 98–111)
Creatinine, Ser: 0.79 mg/dL (ref 0.61–1.24)
GFR calc Af Amer: >60 mL/min (ref >60)
GFR calc non Af Amer: >60 mL/min (ref >60)
Glucose, Bld: 84 mg/dL (ref 70–99)
Potassium: 4.4 mmol/L (ref 3.5–5.1)
Sodium: 138 mmol/L (ref 135–145)
Total Bilirubin: 0.9 mg/dL (ref 0.3–1.2)
Total Protein: 7.5 g/dL (ref 6.5–8.1)

## 2020-03-09 LAB — MAGNESIUM: Magnesium: 2 mg/dL (ref 1.7–2.4)

## 2020-03-09 LAB — LACTATE DEHYDROGENASE: LDH: 131 U/L (ref 98–192)

## 2020-03-09 MED ORDER — IOHEXOL 300 MG/ML  SOLN
100.0000 mL | Freq: Once | INTRAMUSCULAR | Status: AC | PRN
  Administered 2020-03-09: 100 mL via INTRAVENOUS

## 2020-03-16 ENCOUNTER — Inpatient Hospital Stay (HOSPITAL_BASED_OUTPATIENT_CLINIC_OR_DEPARTMENT_OTHER): Payer: Medicare Other | Admitting: Hematology

## 2020-03-16 ENCOUNTER — Other Ambulatory Visit: Payer: Self-pay

## 2020-03-16 VITALS — BP 142/91 | HR 87 | Temp 98.6°F | Resp 17 | Wt 171.0 lb

## 2020-03-16 DIAGNOSIS — C3491 Malignant neoplasm of unspecified part of right bronchus or lung: Secondary | ICD-10-CM

## 2020-03-16 NOTE — Progress Notes (Signed)
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  NGS Results: Not done  CURRENT THERAPY: Durvalumab every 4 weeks  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[place])  08/22/2018 Initial Diagnosis   Small cell carcinoma of lung, right ([place])   08/23/2018 - 11/17/2018 Chemotherapy   The patient had palonosetron (ALOXI) injection 0.25 mg, 0.25 mg, Intravenous,  Once, 4 of 4 cycles Administration: 0.25 mg (08/23/2018), 0.25 mg (09/16/2018), 0.25 mg (10/07/2018), 0.25 mg (10/28/2018) pegfilgrastim-cbqv (UDENYCA) injection 6 mg, 6 mg, Subcutaneous, Once, 4 of 4 cycles Administration: 6 mg (08/27/2018), 6 mg (09/20/2018), 6 mg (10/11/2018), 6 mg (11/01/2018) CARBOplatin (PARAPLATIN) 560 mg in sodium chloride 0.9 % 250 mL chemo infusion, 560 mg (100 % of original dose 564 mg), Intravenous,  Once, 4 of 4 cycles Dose modification:   (original dose 564 mg, Cycle 1),   (original dose 523.5 mg, Cycle 3),   (original dose 523.5 mg, Cycle 4) Administration: 560 mg (08/23/2018), 560 mg (09/16/2018), 520 mg (10/07/2018), 510 mg (10/28/2018) etoposide (VEPESID) 220 mg in sodium chloride 0.9 % 1,000 mL chemo infusion, 100 mg/m2 = 220 mg, Intravenous,  Once, 4 of 4 cycles Administration: 220 mg (08/23/2018), 200 mg (09/16/2018), 200 mg (09/17/2018), 200 mg (09/18/2018), 200 mg (10/07/2018), 200 mg (10/08/2018), 200 mg (10/09/2018), 200 mg (10/28/2018), 200 mg (10/29/2018), 200 mg (10/30/2018) fosaprepitant (EMEND) 150 mg, dexamethasone (DECADRON) 12 mg in sodium chloride 0.9 % 145 mL IVPB, , Intravenous,  Once, 4 of 4  cycles Administration:  (08/23/2018),  (09/16/2018),  (10/07/2018),  (10/28/2018) durvalumab (IMFINZI) 1,500 mg in sodium chloride 0.9 % 100 mL chemo infusion, 1,500 mg (100 % of original dose 1,500 mg), Intravenous,  Once, 3 of 3 cycles Dose modification: 1,500 mg (original dose 1,500 mg, Cycle 2, Reason: Provider Judgment) Administration: 1,500 mg (09/16/2018), 1,500 mg (10/07/2018), 1,500 mg (10/28/2018)  for chemotherapy treatment.    Small cell carcinoma of lung, right ([place])  08/23/2018 - 11/17/2018 Chemotherapy   The patient had palonosetron (ALOXI) injection 0.25 mg, 0.25 mg, Intravenous,  Once, 4 of 4 cycles Administration: 0.25 mg (08/23/2018), 0.25 mg (09/16/2018), 0.25 mg (10/07/2018), 0.25 mg (10/28/2018) pegfilgrastim-cbqv (UDENYCA) injection 6 mg, 6 mg, Subcutaneous, Once, 4 of 4 cycles Administration: 6 mg (08/27/2018), 6 mg (09/20/2018), 6 mg (10/11/2018), 6 mg (11/01/2018) CARBOplatin (PARAPLATIN) 560 mg in sodium chloride 0.9 % 250 mL chemo infusion, 560 mg (100 % of original dose 564 mg), Intravenous,  Once, 4 of 4 cycles Dose modification:   (original dose 564 mg, Cycle 1),   (original dose 523.5 mg, Cycle 3),   (original dose 523.5 mg, Cycle 4) Administration: 560 mg (08/23/2018), 560 mg (09/16/2018), 520 mg (10/07/2018), 510 mg (10/28/2018) etoposide (VEPESID) 220 mg in sodium chloride 0.9 % 1,000 mL chemo infusion, 100 mg/m2 = 220 mg, Intravenous,  Once, 4 of 4 cycles Administration: 220 mg (08/23/2018), 200 mg (09/16/2018), 200 mg (09/17/2018), 200 mg (09/18/2018), 200 mg (10/07/2018), 200 mg (10/08/2018), 200 mg (10/09/2018), 200 mg (10/28/2018), 200 mg (10/29/2018), 200 mg (10/30/2018) fosaprepitant (EMEND) 150 mg, dexamethasone (DECADRON) 12 mg in sodium chloride 0.9 % 145 mL IVPB, , Intravenous,  Once, 4 of 4 cycles Administration:  (08/23/2018),  (09/16/2018),  (10/07/2018),  (10/28/2018) durvalumab (IMFINZI) 1,500 mg in sodium chloride 0.9 % 100 mL chemo infusion, 1,500 mg (100 % of original dose 1,500 mg),  Intravenous,  Once, 3 of 3 cycles Dose modification: 1,500 mg (original dose 1,500 mg, Cycle 2, Reason: Provider Judgment) Administration: 1,500 mg (09/16/2018), 1,500 mg (10/07/2018), 1,500 mg (10/28/2018)  for chemotherapy treatment.    08/26/2018 Initial Diagnosis   Small cell carcinoma of lung, right ([place])   11/20/2018 -  Chemotherapy   The patient had durvalumab (IMFINZI) 1,500 mg in sodium chloride 0.9 % 100 mL chemo infusion, 1,500 mg (100 % of original dose 1,500 mg), Intravenous,  Once, 4 of 9 cycles Dose modification: 1,500 mg (original dose 1,500 mg, Cycle 1, Reason: Other (see comments)) Administration: 1,500 mg (11/20/2018), 1,500 mg (12/19/2018), 1,500 mg (01/16/2019), 1,500 mg (02/13/2019)  for chemotherapy treatment.      CANCER STAGING: Cancer Staging No matching staging information was found for the patient.  INTERVAL HISTORY:  [name], a 74 y.o. 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 [name] was last seen on 12/08/2019.   Today he reports feeling well. He fell down yesterday after mowing his grass while stepping off the lawn mower and scrapped his right knee. He denies having dyspnea and tries to go to the gym, but feels too fatigued to go. His appetite is good. He denies headaches, vision changes, but reports feeling slightly unsteady when he walks. He denies having diarrhea  He is able to do all of his ADL's.   REVIEW OF SYSTEMS:  Review of Systems  Constitutional: Positive for fatigue (moderate). Negative for appetite change.  Eyes: Negative for eye problems.  Respiratory: Negative for shortness of breath.   Gastrointestinal: Negative for diarrhea.  Skin: Positive for wound (R knee after fall).  Neurological: Negative for headaches.  Psychiatric/Behavioral: Positive for sleep disturbance.  All other systems reviewed and are negative.   PAST MEDICAL/SURGICAL HISTORY:  Past Medical History:   Diagnosis Date  . Actinic keratosis   . Arthritis   . Colitis MAY 2012    CT ABD/PELVIS HEP FLEXURE  . COPD (chronic obstructive pulmonary disease) ([place])   . Diabetes mellitus without complication ([place])   . History of kidney stones   . Hyperlipemia   . Hypertension   . NSAID long-term use NAPROXEN FOR OA  . SCL Ca dx'd 08/2018   Past Surgical History:  Procedure Laterality Date  . BACK SURGERY     spinal stenosis  . BASAL CELL CARCINOMA EXCISION  FACE, arms feet, leg  . CHOLECYSTECTOMY  JUNE 2011 MJ   STONES, PANCREATITIS  . KNEE ARTHROSCOPY WITH MEDIAL MENISECTOMY Right 03/24/2015   Procedure: KNEE ARTHROSCOPY WITH MEDIAL MENISECTOMY;  Surgeon: Carole Civil, MD;  Location: [facility];  Service: Orthopedics;  Laterality: Right;  . KNEE SURGERY Left LEFT   arthroscopy  . LITHOTRIPSY  80s  . PORTACATH PLACEMENT Left 09/11/2018   Procedure: INSERTION PORT-A-CATH;  Surgeon: Virl Cagey, MD;  Location: [facility];  Service: General;  Laterality: Left;  . SPINE SURGERY      SOCIAL HISTORY:  Social History   Socioeconomic History  . Marital status: Divorced    Spouse name: Not on file  . Number of children: 0  . Years of education: Not on file  . Highest education level: Not on file  Occupational History  .  Occupation: [place] [employer]   . Occupation: Manufacturing engineer   Tobacco Use  . Smoking status: Former Smoker    Packs/day: 1.00    Years: 50.00    Pack years: 50.00    Types: Cigarettes    Quit date: 08/07/2018    Years since quitting: 1.6  . Smokeless tobacco: Never Used  Substance and Sexual Activity  . Alcohol use: Yes    Comment: once a month  . Drug use: No  . Sexual activity: Never    Birth control/protection: None  Other Topics Concern  . 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   Social Determinants of Health   Financial Resource Strain:   . Difficulty  of Paying Living Expenses:   Food Insecurity:   . Worried About Charity fundraiser in the Last Year:   . Arboriculturist in the Last Year:   Transportation Needs:   . Film/video editor (Medical):   Marland Kitchen Lack of Transportation (Non-Medical):   Physical Activity:   . Days of Exercise per Week:   . Minutes of Exercise per Session:   Stress:   . Feeling of Stress :   Social Connections:   . Frequency of Communication with Friends and Family:   . Frequency of Social Gatherings with Friends and Family:   . Attends Religious Services:   . Active Member of Clubs or Organizations:   . Attends Archivist Meetings:   Marland Kitchen Marital Status:   Intimate Partner Violence:   . Fear of Current or Ex-Partner:   . Emotionally Abused:   Marland Kitchen Physically Abused:   . Sexually Abused:     FAMILY HISTORY:  Family History  Problem Relation Age of Onset  . Cancer Mother        lung  . Cancer Father        lung and liver  . Colon polyps Neg Hx   . Colon cancer Neg Hx     CURRENT MEDICATIONS:  Current Outpatient Medications  Medication Sig Dispense Refill  . albuterol (PROVENTIL HFA;VENTOLIN HFA) 108 (90 Base) MCG/ACT inhaler Inhale 2 puffs into the lungs every 6 (six) hours as needed for wheezing or shortness of breath.     Marland Kitchen aspirin 81 MG tablet Take 81 mg by mouth daily.      . Cholecalciferol 50 MCG (2000 UT) TBDP Take 1 tablet by mouth daily.    Marland Kitchen docusate sodium (COLACE) 100 MG capsule Take 100 mg by mouth 2 (two) times daily.    [place] (IMFINZI IV) Inject into the vein every 21 ( twenty-one) days.    . metFORMIN (GLUCOPHAGE) 500 MG tablet Take 500 mg by mouth 2 (two) times daily with a meal.    . rosuvastatin (CRESTOR) 10 MG tablet Take 10 mg by mouth daily.      . verapamil (COVERA HS) 240 MG (CO) 24 hr tablet Take 120 mg by mouth at bedtime.      No current facility-administered medications for this visit.    ALLERGIES:  No Known Allergies  PHYSICAL EXAM:  Performance  status (ECOG): 1 - Symptomatic but completely ambulatory  Vitals:   03/16/20 1432  BP: (!) 142/91  Pulse: 87  Resp: 17  Temp: 98.6 F (37 C)  SpO2: 96%   Wt Readings from Last 3 Encounters:  03/16/20 171 lb (77.6  kg)  12/08/19 178 lb 4.8 oz (80.9 kg)  09/23/19 155 lb 12.8 oz (70.7 kg)   Physical Exam Vitals reviewed.  Constitutional:      Appearance: Normal appearance.  Cardiovascular:     Rate and Rhythm: Normal rate and regular rhythm.     Pulses: Normal pulses.     Heart sounds: Normal heart sounds.  Pulmonary:     Effort: Pulmonary effort is normal.     Breath sounds: Normal breath sounds.  Skin:    Findings: Abrasion (R knee) present.  Neurological:     General: No focal deficit present.     Mental Status: He is alert and oriented to person, place, and time.  Psychiatric:        Mood and Affect: Mood normal.        Behavior: Behavior normal.      LABORATORY DATA:  I have reviewed the labs as listed.  CBC Latest Ref Rng & Units 03/09/2020 11/28/2019 08/29/2019  WBC 4.0 - 10.5 K/uL 9.1 8.9 9.2  Hemoglobin 13.0 - 17.0 g/dL 14.4 13.5 13.5  Hematocrit 39 - 52 % 43.9 41.3 41.5  Platelets 150 - 400 K/uL 251 210 289   CMP Latest Ref Rng & Units 03/09/2020 11/28/2019 08/29/2019  Glucose 70 - 99 mg/dL 84 89 98  BUN 8 - 23 mg/dL 15 16 14   Creatinine 0.61 - 1.24 mg/dL 0.79 0.92 0.67  Sodium 135 - 145 mmol/L 138 140 140  Potassium 3.5 - 5.1 mmol/L 4.4 4.0 5.2(H)  Chloride 98 - 111 mmol/L 100 102 101  CO2 22 - 32 mmol/L 28 29 30   Calcium 8.9 - 10.3 mg/dL 9.5 9.1 9.8  Total Protein 6.5 - 8.1 g/dL 7.5 6.7 6.7  Total Bilirubin 0.3 - 1.2 mg/dL 0.9 0.6 0.6  Alkaline Phos 38 - 126 U/L 60 69 71  AST 15 - 41 U/L 16 16 17   ALT 0 - 44 U/L 10 12 15    Lab Results  Component Value Date   LDH 131 03/09/2020   LDH 144 04/22/2019    DIAGNOSTIC IMAGING:  I have independently reviewed the scans and discussed with the patient. CT Chest W Contrast  Result Date: 03/09/2020 CLINICAL  DATA:  Metastatic small cell lung cancer status post chemotherapy. EXAM: CT CHEST, ABDOMEN, AND PELVIS WITH CONTRAST TECHNIQUE: Multidetector CT imaging of the chest, abdomen and pelvis was performed following the standard protocol during bolus administration of intravenous contrast. CONTRAST:  164mL OMNIPAQUE IOHEXOL 300 MG/ML  SOLN COMPARISON:  CT scan 12/05/2019 FINDINGS: CT CHEST FINDINGS Cardiovascular: The heart is within normal limits in size and stable. No pericardial effusion. 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 No dissection. Stable atherosclerotic calcifications mainly at the aortic arch. Stable fairly extensive three-vessel coronary artery calcifications. Stable right-sided epicardial cyst. Mediastinum/Nodes: No mediastinal or hilar mass or overt adenopathy. Stable matted soft tissue density throughout the mediastinum consistent with treated tumor. The esophagus is grossly normal. Lungs/Pleura: Stable radiation changes involving the paramediastinal lungs and hilar regions bilaterally. No findings suspicious for recurrent tumor. No worrisome pulmonary nodules to suggest pulmonary metastatic disease. No pleural effusions or pleural lesions. Musculoskeletal: No chest wall mass, supraclavicular or axillary adenopathy. The bony thorax is intact. No worrisome bone lesions. CT ABDOMEN PELVIS FINDINGS Hepatobiliary: No hepatic lesions to suggest metastatic disease. The gallbladder is surgically absent. No intra or extrahepatic biliary dilatation. Pancreas: No mass, inflammation or ductal dilatation. Moderate pancreatic atrophy. Spleen:  Normal size.  No focal lesions. Adrenals/Urinary Tract: Small left adrenal gland nodules are stable. No worrisome renal lesions. Stable left renal cyst. No bladder mass. Stable left-sided bladder calculus. Stomach/Bowel: The stomach, duodenum, small bowel and colon are unremarkable. No acute inflammatory  changes, mass lesions or obstructive findings. Vascular/Lymphatic: Stable advanced atherosclerotic calcifications involving the aorta and branch vessels but no aneurysm or dissection. No mesenteric or retroperitoneal mass or adenopathy. Small scattered lymph nodes are stable. Reproductive: Stable prostate gland enlargement with median lobe hypertrophy impressing on the base the bladder. The seminal vesicles appear normal. Other: No pelvic mass or adenopathy. No free pelvic fluid collections. No inguinal mass or adenopathy. No abdominal wall hernia or subcutaneous lesions. Musculoskeletal: No significant bony findings. IMPRESSION: 1. Stable radiation changes involving the paramediastinal lungs and hilar regions bilaterally. No findings suspicious for recurrent tumor or metastatic disease. 2. 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 3. Stable advanced atherosclerotic calcifications involving the thoracic and abdominal aorta and branch vessels including the coronary arteries. 4. Stable small left adrenal gland nodules. 5. Stable left-sided bladder calculus. 6. Stable prostate gland enlargement with median lobe hypertrophy impressing on the base the bladder. 7. Aortic atherosclerosis. Aortic Atherosclerosis (ICD10-I70.0). Electronically Signed   By: Marijo Sanes M.D.   On: 03/09/2020 17:00   CT Abdomen Pelvis W Contrast  Result Date: 03/09/2020 CLINICAL DATA:  Metastatic small cell lung cancer status post chemotherapy. EXAM: CT CHEST, ABDOMEN, AND PELVIS WITH CONTRAST TECHNIQUE: Multidetector CT imaging of the chest, abdomen and pelvis was performed following the standard protocol during bolus administration of intravenous contrast. CONTRAST:  176mL OMNIPAQUE IOHEXOL 300 MG/ML  SOLN COMPARISON:  CT scan 12/05/2019 FINDINGS: CT CHEST FINDINGS Cardiovascular: The heart is within normal limits in size and stable. No pericardial effusion. Stable fusiform  aneurysmal dilatation of the ascending thoracic aorta with maximum measurement of 4.2 cm. No dissection. Stable atherosclerotic calcifications mainly at the aortic arch. Stable fairly extensive three-vessel coronary artery calcifications. Stable right-sided epicardial cyst. Mediastinum/Nodes: No mediastinal or hilar mass or overt adenopathy. Stable matted soft tissue density throughout the mediastinum consistent with treated tumor. The esophagus is grossly normal. Lungs/Pleura: Stable radiation changes involving the paramediastinal lungs and hilar regions bilaterally. No findings suspicious for recurrent tumor. No worrisome pulmonary nodules to suggest pulmonary metastatic disease. No pleural effusions or pleural lesions. Musculoskeletal: No chest wall mass, supraclavicular or axillary adenopathy. The bony thorax is intact. No worrisome bone lesions. CT ABDOMEN PELVIS FINDINGS Hepatobiliary: No hepatic lesions to suggest metastatic disease. The gallbladder is surgically absent. No intra or extrahepatic biliary dilatation. Pancreas: No mass, inflammation or ductal dilatation. Moderate pancreatic atrophy. Spleen: Normal size.  No focal lesions. Adrenals/Urinary Tract: Small left adrenal gland nodules are stable. No worrisome renal lesions. Stable left renal cyst. No bladder mass. Stable left-sided bladder calculus. Stomach/Bowel: The stomach, duodenum, small bowel and colon are unremarkable. No acute inflammatory changes, mass lesions or obstructive findings. Vascular/Lymphatic: Stable advanced atherosclerotic calcifications involving the aorta and branch vessels but no aneurysm or dissection. No mesenteric or retroperitoneal mass or adenopathy. Small scattered lymph nodes are stable. Reproductive: Stable prostate gland enlargement with median lobe hypertrophy impressing on the base the bladder. The seminal vesicles appear normal. Other: No pelvic mass or adenopathy. No free pelvic fluid collections. No inguinal mass  or adenopathy. No abdominal wall hernia or subcutaneous lesions. Musculoskeletal: No significant bony findings. IMPRESSION: 1. Stable radiation changes involving the paramediastinal lungs and hilar  regions bilaterally. No findings suspicious for recurrent tumor or metastatic disease. 2. 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 3. Stable advanced atherosclerotic calcifications involving the thoracic and abdominal aorta and branch vessels including the coronary arteries. 4. Stable small left adrenal gland nodules. 5. Stable left-sided bladder calculus. 6. Stable prostate gland enlargement with median lobe hypertrophy impressing on the base the bladder. 7. Aortic atherosclerosis. Aortic Atherosclerosis (ICD10-I70.0). Electronically Signed   By: Marijo Sanes M.D.   On: 03/09/2020 17:00     ASSESSMENT:  1.  Extensive stage small cell lung cancer: -4 cycles of carboplatin, VP-16 and durvalumab from 08/23/2018 through 10/28/2018. -Durvalumab maintenance last dose on 02/13/2019, subsequent doses held due to colitis. -We reviewed results and images of the CT scan dated 12/05/2019.  Bilateral pleural effusions have improved.  No pathologically enlarged lymphadenopathy.  Bilateral perihilar and paramedian fibrosis and consolidation is also stable. -I have reviewed his labs which are grossly within normal limits.  I plan to see him back in 3 months with repeat labs and scans. -CT CAP on 03/09/2020 shows stable radiation changes involving the paramediastinal lungs and hilar regions bilaterally.  No evidence of recurrence or metastatic disease.  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  Stable small left adrenal gland nodules.  2.  Metastatic disease to the brain: -He had 3 small subcentimeter meta stasis in the right insula, right occipital lobe and right cerebellar hemisphere, treated with whole brain RT from 09/02/2018 through  09/13/2018. -MRI of the brain on 09/18/2019 did not show any evidence of intracranial metastasis. -Brain MRI on 01/16/2020 no brain metastasis.  Stable to slightly increased postradiation changes in the cerebral white matter.  3.  Colitis: -He experienced diarrhea with blood when he was on durvalumab and was treated with prednisone in the first week of August 2020, gradually tapered off around 04/27/2019. -He does not have any GI symptoms at this time.   PLAN:  1.  Extensive stage small cell lung cancer: -He does not have any clinical signs of recurrence.  Labs reviewed by me shows normal LFTs and CBC. -He is functioning very well and is also working out. -I plan to see him back in 4 months with repeat CT scan and labs.  2.  Metastatic disease to the brain: -Continue to follow-up brain MRI in 3 to 4 months.    Orders placed this encounter:  No orders of the defined types were placed in this encounter.    Derek Jack, MD [place] [phone]   I, Milinda Antis, am acting as a scribe for Dr. Sanda Linger.  I, Derek Jack MD, have reviewed the above documentation for accuracy and completeness, and I agree with the above.

## 2020-03-16 NOTE — Patient Instructions (Signed)
[place] at [facility] Discharge Instructions  You were seen today by Dr. Delton Coombes. He went over your recent results. Continue being physically active as much as tolerable. You will be scheduled for a CT scan of your chest. Dr. Delton Coombes will see you back in 4 months for labs and follow up.   Thank you for choosing [place] at [facility] to provide your oncology and hematology care.  To afford each patient quality time with our provider, please arrive at least 15 minutes before your scheduled appointment time.   If you have a lab appointment with the [facility] please come in thru the Main Entrance and check in at the main information desk  You need to re-schedule your appointment should you arrive 10 or more minutes late.  We strive to give you quality time with our providers, and arriving late affects you and other patients whose appointments are after yours.  Also, if you no show three or more times for appointments you may be dismissed from the clinic at the providers discretion.     Again, thank you for choosing [facility].  Our hope is that these requests will decrease the amount of time that you wait before being seen by our physicians.       _____________________________________________________________  Should you have questions after your visit to [facility], please contact our office at [phone] between the hours of 8:00 a.m. and 4:30 p.m.  Voicemails left after 4:00 p.m. will not be returned until the following business day.  For prescription refill requests, have your pharmacy contact our office and allow 72 hours.    Cancer Center Support Programs:   > Cancer Support Group  2nd Tuesday of the month 1pm-2pm, Journey Room

## 2020-05-05 ENCOUNTER — Other Ambulatory Visit: Payer: Self-pay | Admitting: Radiation Therapy

## 2020-05-05 DIAGNOSIS — C7931 Secondary malignant neoplasm of brain: Secondary | ICD-10-CM

## 2020-05-09 IMAGING — DX DG THORACIC SPINE 3V
3 series · 3 of 3 positions shown · non-contrast
Comparison: Chest CT including bony reformats May 22, 2019

CLINICAL DATA: Dorsalgia

EXAM:
THORACIC SPINE - 3 VIEWS

[t-spine ap]
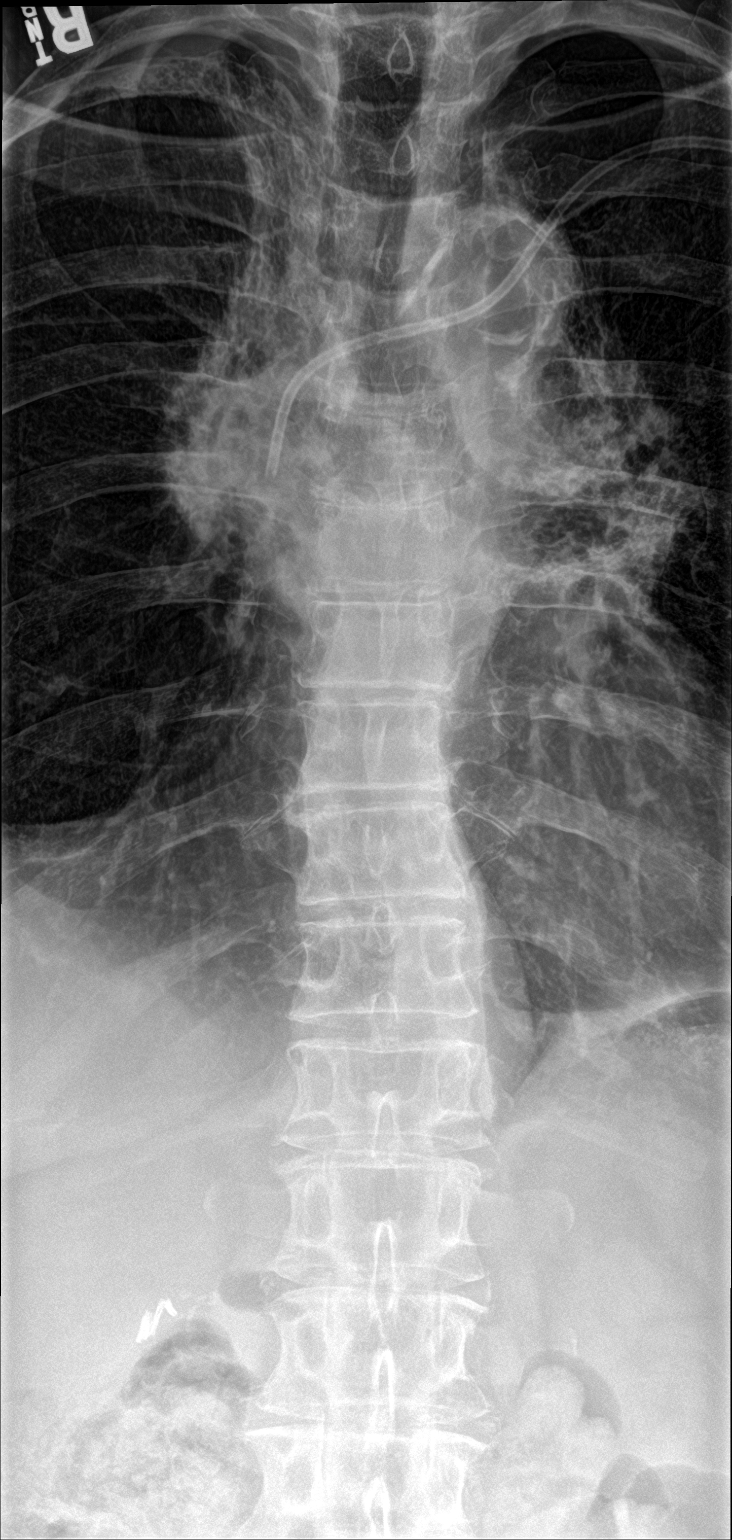

[t-spine lat]
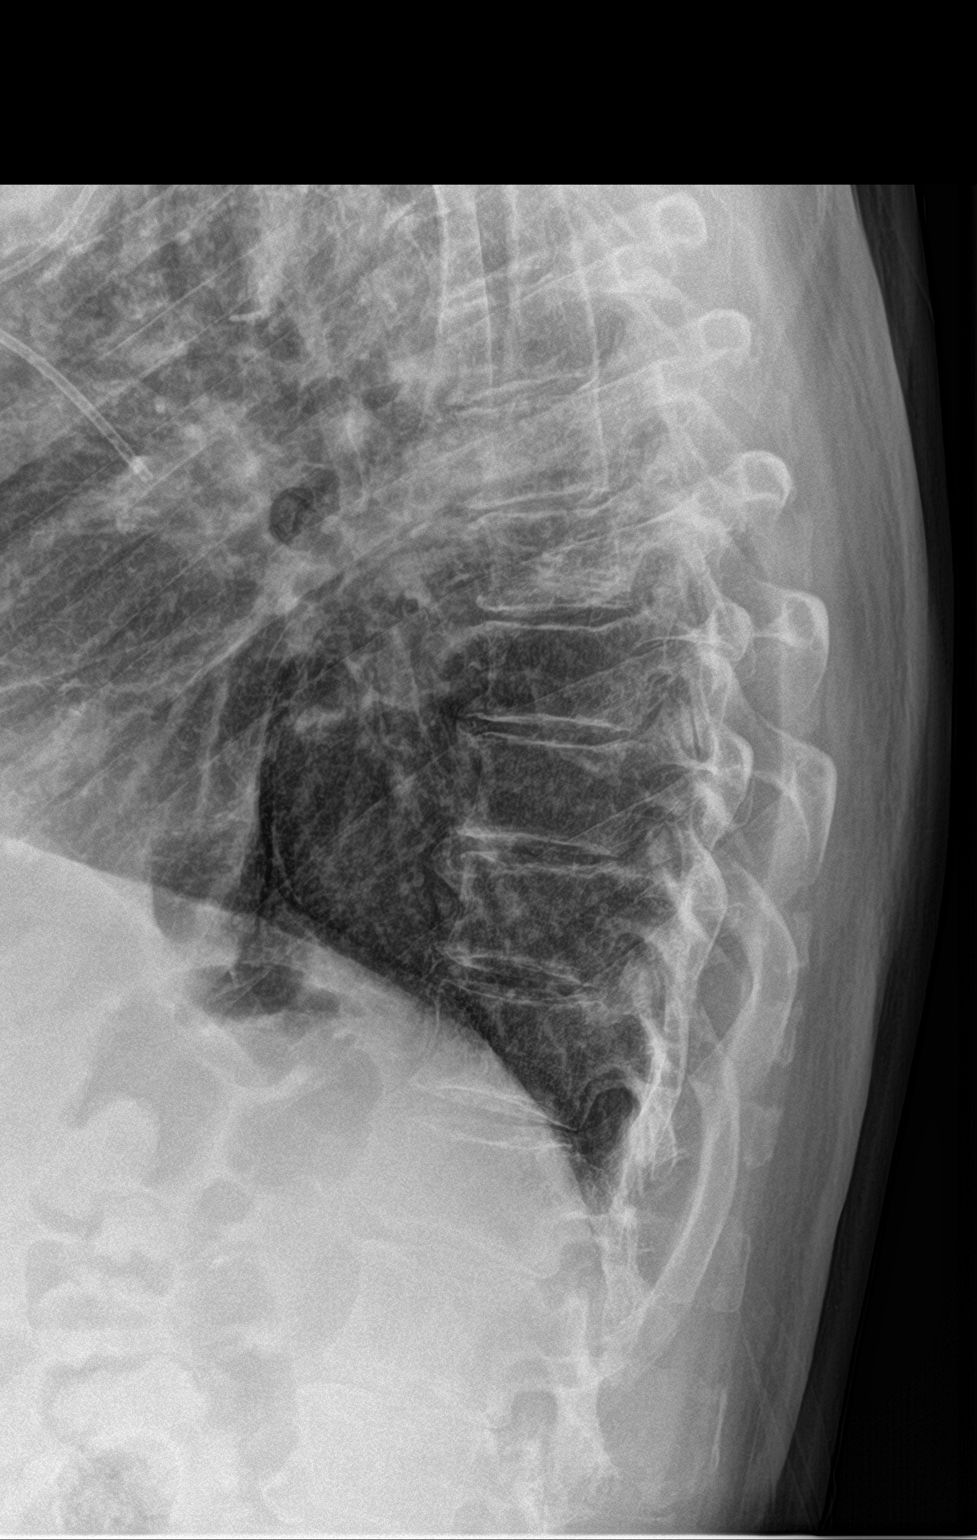

[t-spine swimmers]
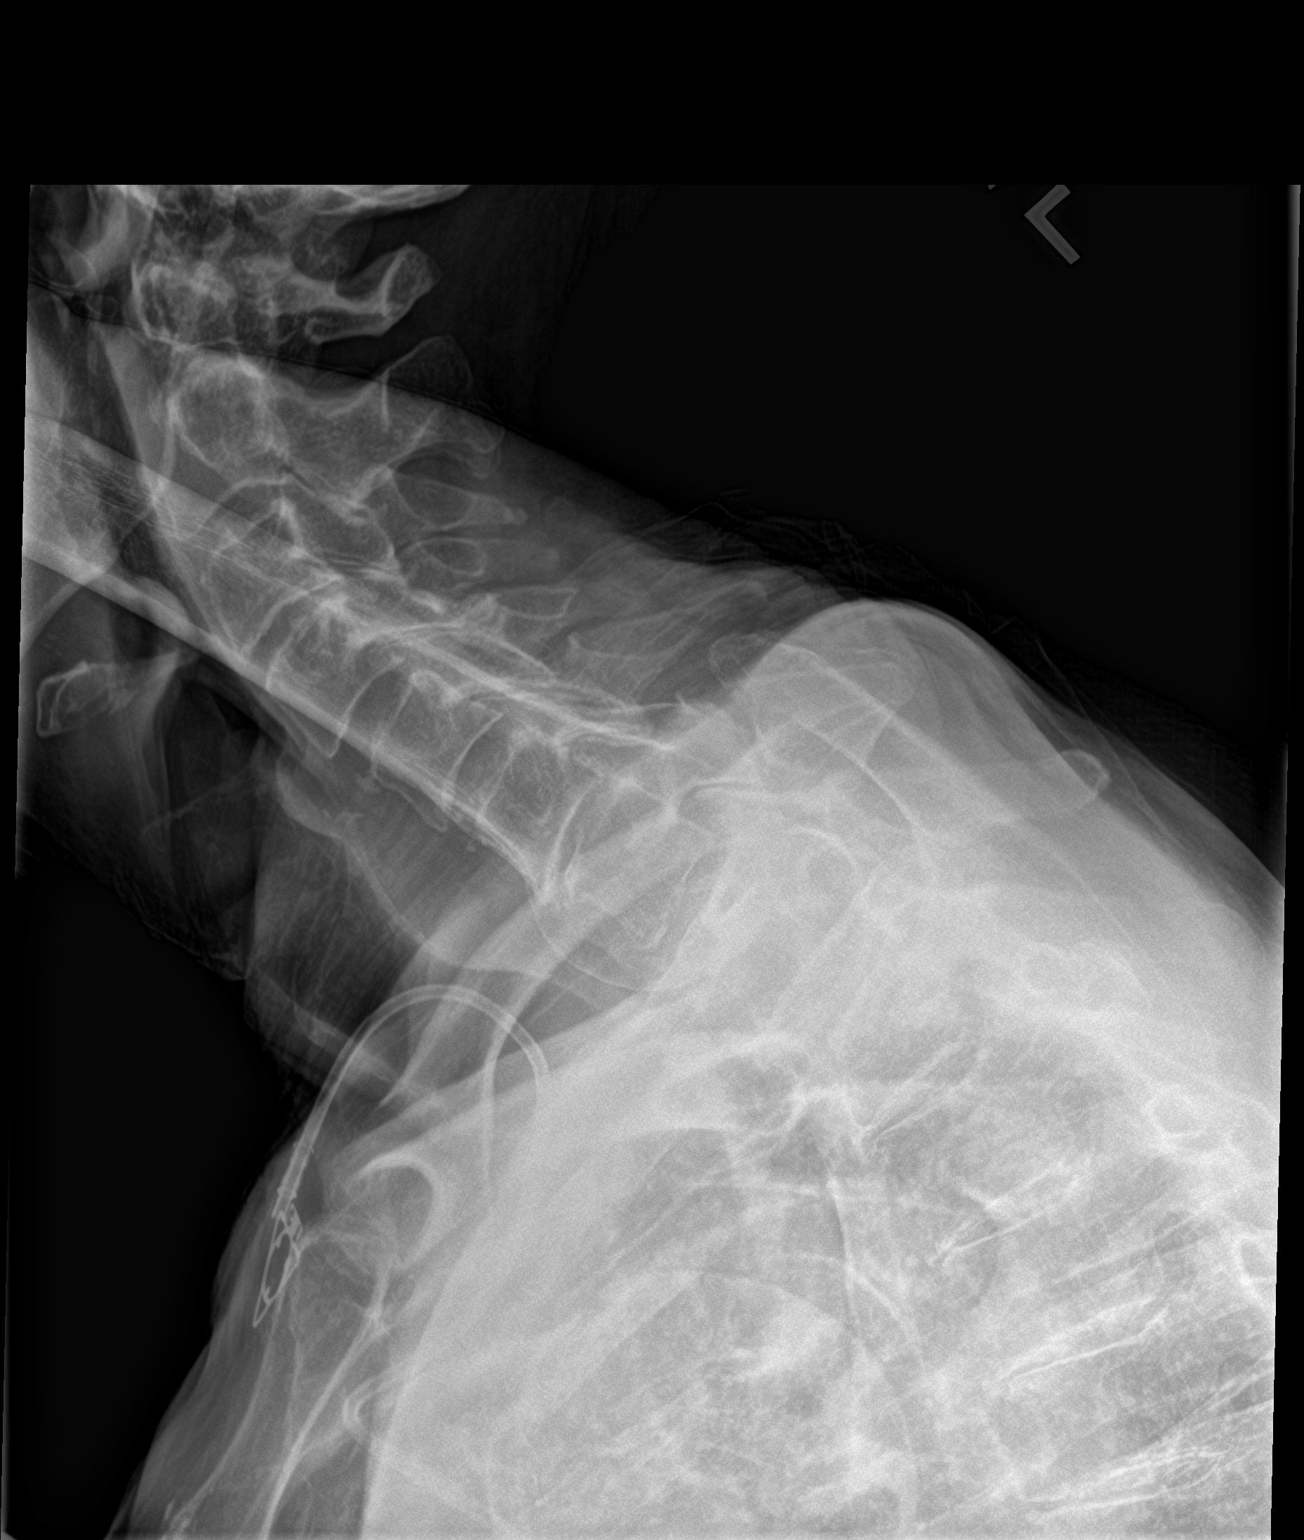

[3 of 3 positions shown; findings below may reference images not displayed]

FINDINGS: Frontal, lateral, and swimmer's views were obtained. There is no
demonstrable fracture or spondylolisthesis. There is relatively mild
disc space narrowing at multiple levels. No erosive change. No
blastic or lytic bone lesions. Port-A-Cath tip is in the superior
vena cava. There is aortic atherosclerosis. Fibrosis is noted in the
hilar and paratracheal regions.
IMPRESSION: Mild osteoarthritic changes several levels. No fracture or
spondylolisthesis. No blastic or lytic bone lesions. Areas of
fibrosis in the lungs, likely in part due to radiation therapy
change. Aortic Atherosclerosis ([id number]).

## 2020-05-20 ENCOUNTER — Ambulatory Visit (HOSPITAL_COMMUNITY): Admission: RE | Admit: 2020-05-20 | Payer: Medicare Other | Source: Ambulatory Visit

## 2020-05-24 ENCOUNTER — Ambulatory Visit
Admission: RE | Admit: 2020-05-24 | Payer: TRICARE For Life (TFL) | Source: Ambulatory Visit | Admitting: Radiation Oncology

## 2020-05-24 ENCOUNTER — Telehealth: Payer: Self-pay | Admitting: Radiation Oncology

## 2020-05-24 ENCOUNTER — Inpatient Hospital Stay: Payer: TRICARE For Life (TFL)

## 2020-05-24 NOTE — Telephone Encounter (Signed)
I left a message for the patient since his MRI was not done last week, since this is the case we will cancel his appt today which was to review that scan. I left the number where he could reach our navigator to reschedule his scan and follow up with me

## 2020-06-03 ENCOUNTER — Other Ambulatory Visit: Payer: Self-pay

## 2020-06-03 ENCOUNTER — Encounter: Payer: Self-pay | Admitting: Radiation Oncology

## 2020-06-04 ENCOUNTER — Ambulatory Visit (HOSPITAL_COMMUNITY): Admission: RE | Admit: 2020-06-04 | Payer: TRICARE For Life (TFL) | Source: Ambulatory Visit

## 2020-06-07 ENCOUNTER — Inpatient Hospital Stay: Payer: TRICARE For Life (TFL)

## 2020-06-07 ENCOUNTER — Ambulatory Visit
Admission: RE | Admit: 2020-06-07 | Discharge: 2020-06-07 | Disposition: A | Discharge status: Home / Self Care | Payer: Medicare Other | Source: Ambulatory Visit | Attending: Radiation Oncology | Admitting: Radiation Oncology

## 2020-06-16 ENCOUNTER — Other Ambulatory Visit: Payer: Self-pay

## 2020-06-16 ENCOUNTER — Ambulatory Visit (HOSPITAL_COMMUNITY)
Admission: RE | Admit: 2020-06-16 | Discharge: 2020-06-16 | Disposition: A | Discharge status: Home / Self Care | Payer: Medicare Other | Source: Ambulatory Visit | Attending: Radiation Oncology | Admitting: Radiation Oncology

## 2020-06-16 DIAGNOSIS — C7949 Secondary malignant neoplasm of other parts of nervous system: Secondary | ICD-10-CM | POA: Diagnosis present

## 2020-06-16 DIAGNOSIS — C7931 Secondary malignant neoplasm of brain: Secondary | ICD-10-CM | POA: Insufficient documentation

## 2020-06-16 MED ORDER — GADOBUTROL 1 MMOL/ML IV SOLN
8.0000 mL | Freq: Once | INTRAVENOUS | Status: AC | PRN
  Administered 2020-06-16: 8 mL via INTRAVENOUS

## 2020-06-16 MED ORDER — HEPARIN SOD (PORK) LOCK FLUSH 100 UNIT/ML IV SOLN
500.0000 [IU] | INTRAVENOUS | Status: AC | PRN
  Administered 2020-06-16: 500 [IU]
  Filled 2020-06-16: qty 5

## 2020-06-21 ENCOUNTER — Ambulatory Visit
Admission: RE | Admit: 2020-06-21 | Discharge: 2020-06-21 | Disposition: A | Discharge status: Home / Self Care | Payer: TRICARE For Life (TFL) | Source: Ambulatory Visit | Attending: Radiation Oncology | Admitting: Radiation Oncology

## 2020-06-21 ENCOUNTER — Inpatient Hospital Stay: Payer: Medicare Other | Attending: Radiation Oncology

## 2020-06-21 DIAGNOSIS — C3491 Malignant neoplasm of unspecified part of right bronchus or lung: Secondary | ICD-10-CM

## 2020-06-21 DIAGNOSIS — E119 Type 2 diabetes mellitus without complications: Secondary | ICD-10-CM | POA: Insufficient documentation

## 2020-06-21 DIAGNOSIS — C771 Secondary and unspecified malignant neoplasm of intrathoracic lymph nodes: Secondary | ICD-10-CM

## 2020-06-21 DIAGNOSIS — Z7982 Long term (current) use of aspirin: Secondary | ICD-10-CM | POA: Insufficient documentation

## 2020-06-21 DIAGNOSIS — Z9221 Personal history of antineoplastic chemotherapy: Secondary | ICD-10-CM | POA: Insufficient documentation

## 2020-06-21 DIAGNOSIS — Z87891 Personal history of nicotine dependence: Secondary | ICD-10-CM | POA: Insufficient documentation

## 2020-06-21 DIAGNOSIS — Z79899 Other long term (current) drug therapy: Secondary | ICD-10-CM | POA: Insufficient documentation

## 2020-06-21 DIAGNOSIS — I10 Essential (primary) hypertension: Secondary | ICD-10-CM | POA: Insufficient documentation

## 2020-06-21 DIAGNOSIS — J449 Chronic obstructive pulmonary disease, unspecified: Secondary | ICD-10-CM | POA: Insufficient documentation

## 2020-06-21 DIAGNOSIS — R531 Weakness: Secondary | ICD-10-CM | POA: Insufficient documentation

## 2020-06-21 DIAGNOSIS — Z7984 Long term (current) use of oral hypoglycemic drugs: Secondary | ICD-10-CM | POA: Insufficient documentation

## 2020-06-21 DIAGNOSIS — E785 Hyperlipidemia, unspecified: Secondary | ICD-10-CM | POA: Insufficient documentation

## 2020-06-21 DIAGNOSIS — C7931 Secondary malignant neoplasm of brain: Secondary | ICD-10-CM | POA: Insufficient documentation

## 2020-06-21 NOTE — Progress Notes (Signed)
Radiation Oncology         [phone] ________________________________  Outpatient Follow Up - Conducted via telephone due to current COVID-19 concerns for limiting patient exposure  I spoke with the patient to conduct this consult visit via telephone to spare the patient unnecessary potential exposure in the healthcare setting during the current COVID-19 pandemic. The patient was notified in advance and was offered a [place] meeting to allow for face to face communication but unfortunately reported that they did not have the appropriate resources/technology to support such a visit and instead preferred to proceed with a telephone visit.   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 Chest / 9 Gy in 3 fractions 2. Chest Target 1 / 25 Gy in 10 fractionTotal dose 34 Gy 3. Whole Brain / 30 Gy in 10 fractions   Narrative:   In summary this is a pleasant, 74 y.o. gentleman with extensive stage small cell carcinoma of the right lung. He was seen while hospitalized with symptoms of SVC syndrome and was diagnosed with extensive stage disease. He received palliative radiotherapy to the chest along with whole brain radiotherapy due to three metastatic lesions identified during his work up. He's done well since his radiotherapy and has remained without disease in the brain. He had been taking immunotherapy but developed colitis, so this was discontinued. He continues to follow in surveillance with Dr. Delton Coombes and his recent staging imaging showed no progressive or concerns for active disease.  He is scheduled to continue his surveillance with Dr. Delton Coombes off treatment, and is due for repeat CT imaging in December.  He did have an MRI of the brain with and without contrast on  06/16/2020 and fortunately this continues to show posttreatment effect.  There was concern for possible area of fusion restriction at the right lateral temporal horn, and further discussion in brain oncology conference was that the site in the ependyma was not definitive for metastatic disease or of concern and the recommendation from conference was to repeat his scan in 6 months time.  He continues to have white matter changes from prior radiation, and the findings including lack of enhancement was felt to be technical factors which also overlapped with the discussion and consideration of the finding along the right lateral temporal ventricle horn.  He is contacted today by phone to discuss these results.   On review of systems, the patient is not available to join [place] due to being hard of hearing and not answering his phone.  His sister however is his main point of contact and will share the information discussed above.  She states interestingly though that he is not as physically active as he is told other providers, she states that she has concerns about his dizziness and being off balance which coincides with what he told me during our last conversation but when offered evaluation at that time he had declined.  I think it is now time for him to consider evaluation with neurology and like for him to see Dr. Mickeal Skinner.  The patient's sister is in agreement with this.  She denies any other known neurologic symptoms.  Past Medical History:  Past Medical History:  Diagnosis Date  . Actinic keratosis   . Arthritis   . Colitis  MAY 2012    CT ABD/PELVIS HEP FLEXURE  . COPD (chronic obstructive pulmonary disease) ([place])   . Diabetes mellitus without complication ([place])   . History of kidney stones   . Hyperlipemia   . Hypertension   . NSAID long-term use NAPROXEN FOR OA  . SCL Ca dx'd 08/2018    Past Surgical History: Past Surgical History:  Procedure Laterality Date  . BACK SURGERY     spinal stenosis   . BASAL CELL CARCINOMA EXCISION  FACE, arms feet, leg  . CHOLECYSTECTOMY  JUNE 2011 MJ   STONES, PANCREATITIS  . KNEE ARTHROSCOPY WITH MEDIAL MENISECTOMY Right 03/24/2015   Procedure: KNEE ARTHROSCOPY WITH MEDIAL MENISECTOMY;  Surgeon: Carole Civil, MD;  Location: [facility];  Service: Orthopedics;  Laterality: Right;  . KNEE SURGERY Left LEFT   arthroscopy  . LITHOTRIPSY  80s  . PORTACATH PLACEMENT Left 09/11/2018   Procedure: INSERTION PORT-A-CATH;  Surgeon: Virl Cagey, MD;  Location: [facility];  Service: General;  Laterality: Left;  . SPINE SURGERY      Social History:  Social History   Socioeconomic History  . Marital status: Divorced    Spouse name: Not on file  . Number of children: 0  . Years of education: Not on file  . Highest education level: Not on file  Occupational History  . Occupation: [place] [employer]   . Occupation: Manufacturing engineer   Tobacco Use  . Smoking status: Former Smoker    Packs/day: 1.00    Years: 50.00    Pack years: 50.00    Types: Cigarettes    Quit date: 08/07/2018    Years since quitting: 1.8  . Smokeless tobacco: Never Used  Substance and Sexual Activity  . Alcohol use: Yes    Comment: once a month  . Drug use: No  . Sexual activity: Never    Birth control/protection: None  Other Topics Concern  . 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   Social Determinants of Health   Financial Resource Strain:   . Difficulty of Paying Living Expenses: Not on file  Food Insecurity:   . Worried About Charity fundraiser in the Last Year: Not on file  . Ran Out of Food in the Last Year: Not on file  Transportation Needs:   . Lack of Transportation (Medical): Not on file  . Lack of Transportation (Non-Medical): Not on file  Physical Activity:   . Days of Exercise per Week: Not on file  . Minutes of Exercise per Session: Not on file  Stress:     . Feeling of Stress : Not on file  Social Connections:   . Frequency of Communication with Friends and Family: Not on file  . Frequency of Social Gatherings with Friends and Family: Not on file  . Attends Religious Services: Not on file  . Active Member of Clubs or Organizations: Not on file  . Attends Archivist Meetings: Not on file  . Marital Status: Not on file  Intimate Partner Violence:   . Fear of Current or Ex-Partner: Not on file  . Emotionally Abused: Not on file  . Physically Abused: Not on file  . Sexually Abused: Not on file    Family History: Family History  Problem Relation Age of Onset  . Cancer Mother        lung  .  Cancer Father        lung and liver  . Colon polyps Neg Hx   . Colon cancer Neg Hx    Medications: Current Outpatient Medications  Medication Sig Dispense Refill  . albuterol (PROVENTIL HFA;VENTOLIN HFA) 108 (90 Base) MCG/ACT inhaler Inhale 2 puffs into the lungs every 6 (six) hours as needed for wheezing or shortness of breath.     Marland Kitchen aspirin 81 MG tablet Take 81 mg by mouth daily.      . Cholecalciferol 50 MCG (2000 UT) TBDP Take 1 tablet by mouth daily.    Marland Kitchen docusate sodium (COLACE) 100 MG capsule Take 100 mg by mouth 2 (two) times daily.    Hunt Oris (IMFINZI IV) Inject into the vein every 21 ( twenty-one) days. (Patient not taking: Reported on 06/03/2020)    . metFORMIN (GLUCOPHAGE) 500 MG tablet Take 500 mg by mouth 2 (two) times daily with a meal.    . rosuvastatin (CRESTOR) 10 MG tablet Take 10 mg by mouth daily.      Marland Kitchen telmisartan (MICARDIS) 40 MG tablet Take 40 mg by mouth daily.    . verapamil (COVERA HS) 240 MG (CO) 24 hr tablet Take 120 mg by mouth at bedtime.      No current facility-administered medications for this encounter.    Allergies:  No Known Allergies  Physical Exam:  Unable to assess due to encounter type.   ECOG Status: 0  Impression/Plan: 1. 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 The patient's imaging was reviewed in brain oncology conference and again fortunately he does not appear to have any active brain disease. The findings along the ventricle on the right were felt to be a technilogical error rather than concerns for disease, as was the changes in sequence of his contrast being administered. He continues with Dr. Delton Coombes in observation since he was unable to tolerate immunotherapy. We will repeat his next MRI in 6 months time and he will keep [place] informed of any changes in his status.  His sister is in agreement with this plan. 2. Dizziness. The patient's scans show post radiation changes. His symptoms have actually progressed despite the last time we spoke he had said that they were better.  He and I had previously discussed referral to Dr. Mickeal Skinner if his symptoms progressed, and I will place request for formal consultation with Dr. Mickeal Skinner.  Again his sister is in agreement and will share this information with him.    Given current concerns for patient exposure during the COVID-19 pandemic, this encounter was conducted via telephone.  The patient has given verbal consent for this type of encounter. The time spent during this encounter was 30 minutes and 50% of that time was spent in preparation, discussion, and  in the coordination of his care. The attendants for this meeting include Shona Simpson, [facility] and the patient's sister Armstead Peaks. During the encounter, Shona Simpson [facility] was located in the radiation oncology department at [facility].  QUINTUS PREMO  was located at home but not on the call. Rather his sister Armstead Peaks was taking the call today    Carola Rhine, [facility]

## 2020-06-29 ENCOUNTER — Other Ambulatory Visit: Payer: Self-pay

## 2020-06-29 ENCOUNTER — Inpatient Hospital Stay (HOSPITAL_BASED_OUTPATIENT_CLINIC_OR_DEPARTMENT_OTHER): Payer: Medicare Other | Admitting: Internal Medicine

## 2020-06-29 VITALS — BP 143/91 | HR 80 | Temp 97.0°F | Resp 18 | Ht 69.0 in | Wt 177.3 lb

## 2020-06-29 DIAGNOSIS — Z79899 Other long term (current) drug therapy: Secondary | ICD-10-CM | POA: Diagnosis not present

## 2020-06-29 DIAGNOSIS — I10 Essential (primary) hypertension: Secondary | ICD-10-CM | POA: Diagnosis not present

## 2020-06-29 DIAGNOSIS — C7931 Secondary malignant neoplasm of brain: Secondary | ICD-10-CM

## 2020-06-29 DIAGNOSIS — Z87891 Personal history of nicotine dependence: Secondary | ICD-10-CM | POA: Diagnosis not present

## 2020-06-29 DIAGNOSIS — Z7982 Long term (current) use of aspirin: Secondary | ICD-10-CM | POA: Diagnosis not present

## 2020-06-29 DIAGNOSIS — Z9221 Personal history of antineoplastic chemotherapy: Secondary | ICD-10-CM | POA: Diagnosis not present

## 2020-06-29 DIAGNOSIS — J449 Chronic obstructive pulmonary disease, unspecified: Secondary | ICD-10-CM | POA: Diagnosis not present

## 2020-06-29 DIAGNOSIS — C3491 Malignant neoplasm of unspecified part of right bronchus or lung: Secondary | ICD-10-CM | POA: Diagnosis not present

## 2020-06-29 DIAGNOSIS — E119 Type 2 diabetes mellitus without complications: Secondary | ICD-10-CM | POA: Diagnosis not present

## 2020-06-29 DIAGNOSIS — R531 Weakness: Secondary | ICD-10-CM | POA: Diagnosis not present

## 2020-06-29 DIAGNOSIS — Z7984 Long term (current) use of oral hypoglycemic drugs: Secondary | ICD-10-CM | POA: Diagnosis not present

## 2020-06-29 DIAGNOSIS — E785 Hyperlipidemia, unspecified: Secondary | ICD-10-CM | POA: Diagnosis not present

## 2020-06-29 DIAGNOSIS — C771 Secondary and unspecified malignant neoplasm of intrathoracic lymph nodes: Secondary | ICD-10-CM | POA: Diagnosis not present

## 2020-06-29 NOTE — Progress Notes (Signed)
[facility] [place] [place], [place] [address] [phone]   Interval Evaluation  Date of Service: 06/29/20 Patient Name: [name] Patient MRN: [id number] Patient DOB: 17-Apr-1946 Provider: Ventura Sellers, MD  Identifying Statement:  [name] is a 74 y.o. male with Brain metastasis ([place]) [C79.31]   Primary Cancer:  Oncologic History: 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[place])  08/22/2018 Initial Diagnosis   Small cell carcinoma of lung, right ([place])   08/23/2018 - 11/01/2018 Chemotherapy   The patient had palonosetron (ALOXI) injection 0.25 mg, 0.25 mg, Intravenous,  Once, 4 of 4 cycles Administration: 0.25 mg (08/23/2018), 0.25 mg (09/16/2018), 0.25 mg (10/07/2018), 0.25 mg (10/28/2018) pegfilgrastim-cbqv (UDENYCA) injection 6 mg, 6 mg, Subcutaneous, Once, 4 of 4 cycles Administration: 6 mg (08/27/2018), 6 mg (09/20/2018), 6 mg (10/11/2018), 6 mg (11/01/2018) CARBOplatin (PARAPLATIN) 560 mg in sodium chloride 0.9 % 250 mL chemo infusion, 560 mg (100 % of original dose 564 mg), Intravenous,  Once, 4 of 4 cycles Dose modification:   (original dose 564 mg, Cycle 1),   (original dose 523.5 mg, Cycle 3),   (original dose 523.5 mg, Cycle 4) Administration: 560 mg (08/23/2018), 560 mg (09/16/2018), 520 mg (10/07/2018), 510 mg (10/28/2018) etoposide (VEPESID) 220 mg in sodium chloride 0.9 % 1,000 mL chemo infusion, 100 mg/m2 = 220 mg, Intravenous,  Once, 4 of 4 cycles Administration: 220 mg (08/23/2018), 200 mg (09/16/2018), 200 mg (09/17/2018), 200 mg (09/18/2018), 200 mg (10/07/2018), 200 mg (10/08/2018), 200 mg (10/09/2018), 200 mg (10/28/2018), 200 mg (10/29/2018), 200 mg (10/30/2018) fosaprepitant (EMEND) 150 mg, dexamethasone (DECADRON) 12 mg in sodium chloride 0.9 % 145 mL IVPB, , Intravenous,  Once, 4 of 4 cycles Administration:  (08/23/2018),  (09/16/2018),  (10/07/2018),  (10/28/2018) durvalumab (IMFINZI)  1,500 mg in sodium chloride 0.9 % 100 mL chemo infusion, 1,500 mg (100 % of original dose 1,500 mg), Intravenous,  Once, 3 of 3 cycles Dose modification: 1,500 mg (original dose 1,500 mg, Cycle 2, Reason: Provider Judgment) Administration: 1,500 mg (09/16/2018), 1,500 mg (10/07/2018), 1,500 mg (10/28/2018)  for chemotherapy treatment.    Small cell carcinoma of lung, right ([place])  08/23/2018 - 11/01/2018 Chemotherapy   The patient had palonosetron (ALOXI) injection 0.25 mg, 0.25 mg, Intravenous,  Once, 4 of 4 cycles Administration: 0.25 mg (08/23/2018), 0.25 mg (09/16/2018), 0.25 mg (10/07/2018), 0.25 mg (10/28/2018) pegfilgrastim-cbqv (UDENYCA) injection 6 mg, 6 mg, Subcutaneous, Once, 4 of 4 cycles Administration: 6 mg (08/27/2018), 6 mg (09/20/2018), 6 mg (10/11/2018), 6 mg (11/01/2018) CARBOplatin (PARAPLATIN) 560 mg in sodium chloride 0.9 % 250 mL chemo infusion, 560 mg (100 % of original dose 564 mg), Intravenous,  Once, 4 of 4 cycles Dose modification:   (original dose 564 mg, Cycle 1),   (original dose 523.5 mg, Cycle 3),   (original dose 523.5 mg, Cycle 4) Administration: 560 mg (08/23/2018), 560 mg (09/16/2018), 520 mg (10/07/2018), 510 mg (10/28/2018) etoposide (VEPESID) 220 mg in sodium chloride 0.9 % 1,000 mL chemo infusion, 100 mg/m2 = 220 mg, Intravenous,  Once, 4 of 4 cycles Administration: 220 mg (08/23/2018), 200 mg (09/16/2018), 200 mg (09/17/2018), 200 mg (09/18/2018), 200 mg (10/07/2018), 200 mg (10/08/2018), 200 mg (10/09/2018), 200 mg (10/28/2018), 200 mg (10/29/2018), 200 mg (10/30/2018) fosaprepitant (EMEND) 150 mg, dexamethasone (DECADRON) 12 mg in sodium chloride 0.9 % 145 mL IVPB, , Intravenous,  Once, 4 of 4 cycles Administration:  (08/23/2018),  (09/16/2018),  (10/07/2018),  (  10/28/2018) durvalumab (IMFINZI) 1,500 mg in sodium chloride 0.9 % 100 mL chemo infusion, 1,500 mg (100 % of original dose 1,500 mg), Intravenous,  Once, 3 of 3 cycles Dose modification: 1,500 mg (original dose 1,500 mg, Cycle 2,  Reason: Provider Judgment) Administration: 1,500 mg (09/16/2018), 1,500 mg (10/07/2018), 1,500 mg (10/28/2018)  for chemotherapy treatment.    08/26/2018 Initial Diagnosis   Small cell carcinoma of lung, right ([place])   11/20/2018 -  Chemotherapy   The patient had durvalumab (IMFINZI) 1,500 mg in sodium chloride 0.9 % 100 mL chemo infusion, 1,500 mg (100 % of original dose 1,500 mg), Intravenous,  Once, 4 of 9 cycles Dose modification: 1,500 mg (original dose 1,500 mg, Cycle 1, Reason: Other (see comments)) Administration: 1,500 mg (11/20/2018), 1,500 mg (12/19/2018), 1,500 mg (01/16/2019), 1,500 mg (02/13/2019)  for chemotherapy treatment.      History of Present Illness: The patient's records from the referring physician were obtained and reviewed and the patient interviewed to confirm this HPI.  [name] presents today for follow up after recent MRI brain.  He describes brief periods of unsteady feeling, usually when he stands up from seated position.  This lasts for seconds, then resolves on its own.  He continues to be quite active otherwise, working out in gym several days per week.  Main complaint is fatigue and generalized weakness since diagnosis and treatment.  Medications: Current Outpatient Medications on File Prior to Visit  Medication Sig Dispense Refill  . albuterol (PROVENTIL HFA;VENTOLIN HFA) 108 (90 Base) MCG/ACT inhaler Inhale 2 puffs into the lungs every 6 (six) hours as needed for wheezing or shortness of breath.     Marland Kitchen aspirin 81 MG tablet Take 81 mg by mouth daily.      . Cholecalciferol 50 MCG (2000 UT) TBDP Take 1 tablet by mouth daily.    Marland Kitchen docusate sodium (COLACE) 100 MG capsule Take 100 mg by mouth 2 (two) times daily.    . metFORMIN (GLUCOPHAGE) 500 MG tablet Take 500 mg by mouth 2 (two) times daily with a meal.    . rosuvastatin (CRESTOR) 10 MG tablet Take 10 mg by mouth daily.      Marland Kitchen telmisartan (MICARDIS) 40 MG tablet Take 40 mg by mouth daily.    . verapamil (COVERA  HS) 240 MG (CO) 24 hr tablet Take 120 mg by mouth at bedtime.     [name] (IMFINZI IV) Inject into the vein every 21 ( twenty-one) days. (Patient not taking: Reported on 06/03/2020)     No current facility-administered medications on file prior to visit.    Allergies: No Known Allergies Past Medical History:  Past Medical History:  Diagnosis Date  . Actinic keratosis   . Arthritis   . Colitis MAY 2012    CT ABD/PELVIS HEP FLEXURE  . COPD (chronic obstructive pulmonary disease) ([place])   . Diabetes mellitus without complication ([place])   . History of kidney stones   . Hyperlipemia   . Hypertension   . NSAID long-term use NAPROXEN FOR OA  . SCL Ca dx'd 08/2018   Past Surgical History:  Past Surgical History:  Procedure Laterality Date  . BACK SURGERY     spinal stenosis  . BASAL CELL CARCINOMA EXCISION  FACE, arms feet, leg  . CHOLECYSTECTOMY  JUNE 2011 MJ   STONES, PANCREATITIS  . KNEE ARTHROSCOPY WITH MEDIAL MENISECTOMY Right 03/24/2015   Procedure: KNEE ARTHROSCOPY WITH MEDIAL MENISECTOMY;  Surgeon: Carole Civil, MD;  Location: [facility];  Service: Orthopedics;  Laterality: Right;  . KNEE SURGERY Left LEFT   arthroscopy  . LITHOTRIPSY  80s  . PORTACATH PLACEMENT Left 09/11/2018   Procedure: INSERTION PORT-A-CATH;  Surgeon: Virl Cagey, MD;  Location: [facility];  Service: General;  Laterality: Left;  . SPINE SURGERY     Social History:  Social History   Socioeconomic History  . Marital status: Divorced    Spouse name: Not on file  . Number of children: 0  . Years of education: Not on file  . Highest education level: Not on file  Occupational History  . Occupation: [place] [employer]   . Occupation: Manufacturing engineer   Tobacco Use  . Smoking status: Former Smoker    Packs/day: 1.00    Years: 50.00    Pack years: 50.00    Types: Cigarettes    Quit date: 08/07/2018    Years since quitting: 1.8  . Smokeless tobacco: Never Used  Substance and Sexual Activity  .  Alcohol use: Yes    Comment: once a month  . Drug use: No  . Sexual activity: Never    Birth control/protection: None  Other Topics Concern  . 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   Social Determinants of Health   Financial Resource Strain:   . Difficulty of Paying Living Expenses: Not on file  Food Insecurity:   . Worried About Charity fundraiser in the Last Year: Not on file  . Ran Out of Food in the Last Year: Not on file  Transportation Needs:   . Lack of Transportation (Medical): Not on file  . Lack of Transportation (Non-Medical): Not on file  Physical Activity:   . Days of Exercise per Week: Not on file  . Minutes of Exercise per Session: Not on file  Stress:   . Feeling of Stress : Not on file  Social Connections:   . Frequency of Communication with Friends and Family: Not on file  . Frequency of Social Gatherings with Friends and Family: Not on file  . Attends Religious Services: Not on file  . Active Member of Clubs or Organizations: Not on file  . Attends Archivist Meetings: Not on file  . Marital Status: Not on file  Intimate Partner Violence:   . Fear of Current or Ex-Partner: Not on file  . Emotionally Abused: Not on file  . Physically Abused: Not on file  . Sexually Abused: Not on file   Family History:  Family History  Problem Relation Age of Onset  . Cancer Mother        lung  . Cancer Father        lung and liver  . Colon polyps Neg Hx   . Colon cancer Neg Hx     Review of Systems: Constitutional: Doesn't report fevers, chills or abnormal weight loss Eyes: Doesn't report blurriness of vision Ears, nose, mouth, throat, and face: Doesn't report sore throat Respiratory: Doesn't report cough, dyspnea or wheezes Cardiovascular: Doesn't report palpitation, chest discomfort  Gastrointestinal:  Doesn't report nausea, constipation,  diarrhea GU: Doesn't report incontinence Skin: Doesn't report skin rashes Neurological: Per HPI Musculoskeletal: Doesn't report joint pain Behavioral/Psych: Doesn't report anxiety  Physical Exam: Vitals:   06/29/20 0913  BP: (!) 143/91  Pulse: 80  Resp: 18  Temp: (!) 97 F (36.1 C)  SpO2:  95%   KPS: 70. General: Alert, cooperative, pleasant, in no acute distress Head: Normal EENT: No conjunctival injection or scleral icterus.  Lungs: Resp effort normal Cardiac: Regular rate Abdomen: Non-distended abdomen Skin: No rashes cyanosis or petechiae. Extremities: No clubbing or edema  Neurologic Exam: Mental Status: Awake, alert, attentive to examiner. Oriented to self and environment. Language is fluent with intact comprehension.  Cranial Nerves: Visual acuity is grossly normal. Visual fields are full. Extra-ocular movements intact. No ptosis. Face is symmetric Motor: Tone and bulk are normal. Power is full in both arms and legs. Reflexes are symmetric, no pathologic reflexes present.  Sensory: Intact to light touch Gait: Normal.   Labs: I have reviewed the data as listed    Component Value Date/Time   NA 138 03/09/2020 0925   K 4.4 03/09/2020 0925   CL 100 03/09/2020 0925   CO2 28 03/09/2020 0925   GLUCOSE 84 03/09/2020 0925   BUN 15 03/09/2020 0925   CREATININE 0.79 03/09/2020 0925   CALCIUM 9.5 03/09/2020 0925   PROT 7.5 03/09/2020 0925   ALBUMIN 4.5 03/09/2020 0925   AST 16 03/09/2020 0925   ALT 10 03/09/2020 0925   ALKPHOS 60 03/09/2020 0925   BILITOT 0.9 03/09/2020 0925   GFRNONAA >60 03/09/2020 0925   GFRAA >60 03/09/2020 0925   Lab Results  Component Value Date   WBC 9.1 03/09/2020   NEUTROABS 6.5 03/09/2020   HGB 14.4 03/09/2020   HCT 43.9 03/09/2020   MCV 99.3 03/09/2020   PLT 251 03/09/2020    Imaging:  MR Brain W Wo Contrast  Result Date: 06/17/2020 CLINICAL DATA:  Stereotactic radio surgery follow-up EXAM: MRI HEAD WITHOUT AND WITH CONTRAST  TECHNIQUE: Multiplanar, multiecho pulse sequences of the brain and surrounding structures were obtained without and with intravenous contrast. CONTRAST:  23mL GADAVIST GADOBUTROL 1 MMOL/ML IV SOLN COMPARISON:  None. FINDINGS: Brain: There is a punctate focus of diffusion restriction at the medial surface of the temporal horn of the right lateral ventricle. Diffuse confluent hyperintense T2-weighted signal within the periventricular, deep and juxtacortical white matter. There is generalized atrophy without lobar predilection. No chronic microhemorrhage. Normal midline structures. There is no contrast enhancement of any intracranial or extracranial structure. Vascular: Normal flow voids. Skull and upper cervical spine: Normal marrow signal. Sinuses/Orbits: Negative. Other: None. IMPRESSION: 1. Although intravenous contrast agent was administered, there is no sinonasal mucosal enhancement, suggesting that the contrast agent head not reached the head at the time of acquisition, either due to technical factors or decreased cardiac output. Therefore, assessment for intracranial metastases is severely limited. 2. Punctate focus of fusion restriction at the medial surface of the right lateral ventricle temporal horn, nonspecific. This may indicate xanthogranulomatous material in the right choroid plexus, but an ependymal metastasis would be difficult to exclude on this study. 3. Diffuse white matter changes, due to radiation. Electronically Signed   By: Ulyses Jarred M.D.   On: 06/17/2020 00:26    [place] Clinician Interpretation: I have personally reviewed the radiological images as listed.  My interpretation, in the context of the patient's clinical presentation, is stable disease   Assessment/Plan Brain metastasis ([place]) [C79.31]  [name] is clinically stable today.  MRI demonstrates small focus of restricted diffusion along R ependymal surface of unclear etiology.  It is not quite characteristic of metastasis,  and too peripheral to be an infarct.  We offered physical therapy referral for gait instability; he will let us know if he wants to  proceed with this.  We ask that [name] return to clinic in 4 months following next brain MRI, or sooner as needed.  We spent twenty additional minutes teaching regarding the natural history, biology, and historical experience in the treatment of neurologic complications of cancer.   We appreciate the opportunity to participate in the care of [name].   All questions were answered. The patient knows to call the clinic with any problems, questions or concerns. No barriers to learning were detected.  The total time spent in the encounter was 40 minutes and more than 50% was on counseling and review of test results   Ventura Sellers, MD Medical Director of Neuro-Oncology [facility] 06/29/20 3:46 PM

## 2020-07-12 ENCOUNTER — Other Ambulatory Visit: Payer: Self-pay | Admitting: Radiation Therapy

## 2020-07-14 ENCOUNTER — Inpatient Hospital Stay (HOSPITAL_COMMUNITY): Payer: Medicare Other | Attending: Hematology and Oncology

## 2020-07-14 ENCOUNTER — Ambulatory Visit (HOSPITAL_COMMUNITY)
Admission: RE | Admit: 2020-07-14 | Discharge: 2020-07-14 | Disposition: A | Discharge status: Home / Self Care | Payer: Medicare Other | Source: Ambulatory Visit | Attending: Hematology | Admitting: Hematology

## 2020-07-14 ENCOUNTER — Other Ambulatory Visit: Payer: Self-pay

## 2020-07-14 DIAGNOSIS — E785 Hyperlipidemia, unspecified: Secondary | ICD-10-CM | POA: Insufficient documentation

## 2020-07-14 DIAGNOSIS — E119 Type 2 diabetes mellitus without complications: Secondary | ICD-10-CM | POA: Insufficient documentation

## 2020-07-14 DIAGNOSIS — C3491 Malignant neoplasm of unspecified part of right bronchus or lung: Secondary | ICD-10-CM | POA: Insufficient documentation

## 2020-07-14 DIAGNOSIS — M1990 Unspecified osteoarthritis, unspecified site: Secondary | ICD-10-CM | POA: Insufficient documentation

## 2020-07-14 DIAGNOSIS — Z7982 Long term (current) use of aspirin: Secondary | ICD-10-CM | POA: Insufficient documentation

## 2020-07-14 DIAGNOSIS — Z87891 Personal history of nicotine dependence: Secondary | ICD-10-CM | POA: Diagnosis not present

## 2020-07-14 DIAGNOSIS — Z7984 Long term (current) use of oral hypoglycemic drugs: Secondary | ICD-10-CM | POA: Diagnosis not present

## 2020-07-14 DIAGNOSIS — K529 Noninfective gastroenteritis and colitis, unspecified: Secondary | ICD-10-CM | POA: Diagnosis not present

## 2020-07-14 DIAGNOSIS — J449 Chronic obstructive pulmonary disease, unspecified: Secondary | ICD-10-CM | POA: Diagnosis not present

## 2020-07-14 DIAGNOSIS — Z79899 Other long term (current) drug therapy: Secondary | ICD-10-CM | POA: Insufficient documentation

## 2020-07-14 DIAGNOSIS — I10 Essential (primary) hypertension: Secondary | ICD-10-CM | POA: Insufficient documentation

## 2020-07-14 DIAGNOSIS — C7931 Secondary malignant neoplasm of brain: Secondary | ICD-10-CM | POA: Diagnosis present

## 2020-07-14 DIAGNOSIS — Z9221 Personal history of antineoplastic chemotherapy: Secondary | ICD-10-CM | POA: Diagnosis present

## 2020-07-14 LAB — CBC WITH DIFFERENTIAL/PLATELET
Abs Immature Granulocytes: 0.03 10*3/uL (ref 0.00–0.07)
Basophils Absolute: 0 10*3/uL (ref 0.0–0.1)
Basophils Relative: 0 %
Eosinophils Absolute: 0.2 10*3/uL (ref 0.0–0.5)
Eosinophils Relative: 2 %
HCT: 45.8 % (ref 39.0–52.0)
Hemoglobin: 15 g/dL (ref 13.0–17.0)
Immature Granulocytes: 0 %
Lymphocytes Relative: 24 %
Lymphs Abs: 2.1 10*3/uL (ref 0.7–4.0)
MCH: 32.4 pg (ref 26.0–34.0)
MCHC: 32.8 g/dL (ref 30.0–36.0)
MCV: 98.9 fL (ref 80.0–100.0)
Monocytes Absolute: 0.6 10*3/uL (ref 0.1–1.0)
Monocytes Relative: 7 %
Neutro Abs: 5.9 10*3/uL (ref 1.7–7.7)
Neutrophils Relative %: 67 %
Platelets: 261 10*3/uL (ref 150–400)
RBC: 4.63 MIL/uL (ref 4.22–5.81)
RDW: 13.9 % (ref 11.5–15.5)
WBC: 8.7 10*3/uL (ref 4.0–10.5)
nRBC: 0 % (ref 0.0–0.2)

## 2020-07-14 LAB — COMPREHENSIVE METABOLIC PANEL
ALT: 8 U/L (ref 0–44)
AST: 14 U/L — ABNORMAL LOW (ref 15–41)
Albumin: 4.1 g/dL (ref 3.5–5.0)
Alkaline Phosphatase: 87 U/L (ref 38–126)
Anion gap: 8 (ref 5–15)
BUN: 10 mg/dL (ref 8–23)
CO2: 29 mmol/L (ref 22–32)
Calcium: 9.2 mg/dL (ref 8.9–10.3)
Chloride: 101 mmol/L (ref 98–111)
Creatinine, Ser: 0.86 mg/dL (ref 0.61–1.24)
GFR, Estimated: >60 mL/min (ref >60)
Glucose, Bld: 101 mg/dL — ABNORMAL HIGH (ref 70–99)
Potassium: 4 mmol/L (ref 3.5–5.1)
Sodium: 138 mmol/L (ref 135–145)
Total Bilirubin: 1.2 mg/dL (ref 0.3–1.2)
Total Protein: 7 g/dL (ref 6.5–8.1)

## 2020-07-14 MED ORDER — IOHEXOL 300 MG/ML  SOLN
100.0000 mL | Freq: Once | INTRAMUSCULAR | Status: AC | PRN
  Administered 2020-07-14: 100 mL via INTRAVENOUS

## 2020-07-15 ENCOUNTER — Other Ambulatory Visit (HOSPITAL_COMMUNITY): Payer: TRICARE For Life (TFL)

## 2020-07-22 ENCOUNTER — Ambulatory Visit (HOSPITAL_COMMUNITY): Payer: TRICARE For Life (TFL) | Admitting: Hematology

## 2020-07-23 ENCOUNTER — Encounter (HOSPITAL_COMMUNITY): Payer: Self-pay | Admitting: Hematology and Oncology

## 2020-07-23 ENCOUNTER — Other Ambulatory Visit: Payer: Self-pay

## 2020-07-23 ENCOUNTER — Ambulatory Visit (HOSPITAL_COMMUNITY): Payer: TRICARE For Life (TFL) | Admitting: Hematology and Oncology

## 2020-07-23 ENCOUNTER — Inpatient Hospital Stay (HOSPITAL_BASED_OUTPATIENT_CLINIC_OR_DEPARTMENT_OTHER): Payer: Medicare Other | Admitting: Hematology and Oncology

## 2020-07-23 VITALS — BP 121/77 | HR 77 | Resp 16 | Wt 178.9 lb

## 2020-07-23 DIAGNOSIS — C3491 Malignant neoplasm of unspecified part of right bronchus or lung: Secondary | ICD-10-CM

## 2020-07-23 DIAGNOSIS — C3490 Malignant neoplasm of unspecified part of unspecified bronchus or lung: Secondary | ICD-10-CM

## 2020-07-23 DIAGNOSIS — Z23 Encounter for immunization: Secondary | ICD-10-CM | POA: Diagnosis not present

## 2020-07-23 DIAGNOSIS — C7931 Secondary malignant neoplasm of brain: Secondary | ICD-10-CM | POA: Diagnosis not present

## 2020-07-23 MED ORDER — INFLUENZA VAC A&B SA ADJ QUAD 0.5 ML IM PRSY: 0.5000 mL | PREFILLED_SYRINGE | Freq: Once | INTRAMUSCULAR | Status: DC

## 2020-07-23 NOTE — Progress Notes (Signed)
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  NGS Results: Not done  CURRENT THERAPY: Durvalumab every 4 weeks  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[facility])  08/22/2018 Initial Diagnosis   Small cell carcinoma of lung, right ([facility])   08/23/2018 - 11/01/2018 Chemotherapy   The patient had palonosetron (ALOXI) injection 0.25 mg, 0.25 mg, Intravenous,  Once, 4 of 4 cycles Administration: 0.25 mg (08/23/2018), 0.25 mg (09/16/2018), 0.25 mg (10/07/2018), 0.25 mg (10/28/2018) pegfilgrastim-cbqv (UDENYCA) injection 6 mg, 6 mg, Subcutaneous, Once, 4 of 4 cycles Administration: 6 mg (08/27/2018), 6 mg (09/20/2018), 6 mg (10/11/2018), 6 mg (11/01/2018) CARBOplatin (PARAPLATIN) 560 mg in sodium chloride 0.9 % 250 mL chemo infusion, 560 mg (100 % of original dose 564 mg), Intravenous,  Once, 4 of 4 cycles Dose modification:   (original dose 564 mg, Cycle 1),   (original dose 523.5 mg, Cycle 3),   (original dose 523.5 mg, Cycle 4) Administration: 560 mg (08/23/2018), 560 mg (09/16/2018), 520 mg (10/07/2018), 510 mg (10/28/2018) etoposide (VEPESID) 220 mg in sodium chloride 0.9 % 1,000 mL chemo infusion, 100 mg/m2 = 220 mg, Intravenous,  Once, 4 of 4 cycles Administration: 220 mg (08/23/2018), 200 mg (09/16/2018), 200 mg (09/17/2018), 200 mg (09/18/2018), 200 mg (10/07/2018), 200 mg (10/08/2018), 200 mg (10/09/2018), 200 mg (10/28/2018), 200 mg (10/29/2018), 200 mg (10/30/2018) fosaprepitant (EMEND) 150 mg, dexamethasone (DECADRON) 12 mg in sodium chloride 0.9 % 145 mL IVPB, , Intravenous,  Once, 4 of 4  cycles Administration:  (08/23/2018),  (09/16/2018),  (10/07/2018),  (10/28/2018) durvalumab (IMFINZI) 1,500 mg in sodium chloride 0.9 % 100 mL chemo infusion, 1,500 mg (100 % of original dose 1,500 mg), Intravenous,  Once, 3 of 3 cycles Dose modification: 1,500 mg (original dose 1,500 mg, Cycle 2, Reason: Provider Judgment) Administration: 1,500 mg (09/16/2018), 1,500 mg (10/07/2018), 1,500 mg (10/28/2018)  for chemotherapy treatment.    Small cell carcinoma of lung, right ([place])  08/23/2018 - 11/01/2018 Chemotherapy   The patient had palonosetron (ALOXI) injection 0.25 mg, 0.25 mg, Intravenous,  Once, 4 of 4 cycles Administration: 0.25 mg (08/23/2018), 0.25 mg (09/16/2018), 0.25 mg (10/07/2018), 0.25 mg (10/28/2018) pegfilgrastim-cbqv (UDENYCA) injection 6 mg, 6 mg, Subcutaneous, Once, 4 of 4 cycles Administration: 6 mg (08/27/2018), 6 mg (09/20/2018), 6 mg (10/11/2018), 6 mg (11/01/2018) CARBOplatin (PARAPLATIN) 560 mg in sodium chloride 0.9 % 250 mL chemo infusion, 560 mg (100 % of original dose 564 mg), Intravenous,  Once, 4 of 4 cycles Dose modification:   (original dose 564 mg, Cycle 1),   (original dose 523.5 mg, Cycle 3),   (original dose 523.5 mg, Cycle 4) Administration: 560 mg (08/23/2018), 560 mg (09/16/2018), 520 mg (10/07/2018), 510 mg (10/28/2018) etoposide (VEPESID) 220 mg in sodium chloride 0.9 % 1,000 mL chemo infusion, 100 mg/m2 = 220 mg, Intravenous,  Once, 4 of 4 cycles Administration: 220 mg (08/23/2018), 200 mg (09/16/2018), 200 mg (09/17/2018), 200 mg (09/18/2018), 200 mg (10/07/2018), 200 mg (10/08/2018), 200 mg (10/09/2018), 200 mg (10/28/2018), 200 mg (10/29/2018), 200 mg (10/30/2018) fosaprepitant (EMEND) 150 mg, dexamethasone (DECADRON) 12 mg in sodium chloride 0.9 % 145 mL IVPB, ,  Intravenous,  Once, 4 of 4 cycles Administration:  (08/23/2018),  (09/16/2018),  (10/07/2018),  (10/28/2018) durvalumab (IMFINZI) 1,500 mg in sodium chloride 0.9 % 100 mL chemo infusion, 1,500 mg (100 % of original dose 1,500 mg),  Intravenous,  Once, 3 of 3 cycles Dose modification: 1,500 mg (original dose 1,500 mg, Cycle 2, Reason: Provider Judgment) Administration: 1,500 mg (09/16/2018), 1,500 mg (10/07/2018), 1,500 mg (10/28/2018)  for chemotherapy treatment.    08/26/2018 Initial Diagnosis   Small cell carcinoma of lung, right ([facility])   11/20/2018 -  Chemotherapy   The patient had durvalumab (IMFINZI) 1,500 mg in sodium chloride 0.9 % 100 mL chemo infusion, 1,500 mg (100 % of original dose 1,500 mg), Intravenous,  Once, 4 of 9 cycles Dose modification: 1,500 mg (original dose 1,500 mg, Cycle 1, Reason: Other (see comments)) Administration: 1,500 mg (11/20/2018), 1,500 mg (12/19/2018), 1,500 mg (01/16/2019), 1,500 mg (02/13/2019)  for chemotherapy treatment.      CANCER STAGING: Cancer Staging No matching staging information was found for the patient.  INTERVAL HISTORY:  [name], a 74 y.o. 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 Governor was last seen on Aug 2021.   Today he reports feeling well. He denies having dyspnea, cough, chest pain or hemoptysis. He does report some fatigue which he says is expected for his age. He denies headaches, vision changes, but reports feeling slightly unsteady when he walks. He denies having diarrhea  He is able to do all of his ADL's.   REVIEW OF SYSTEMS:  Review of Systems  Constitutional: Positive for fatigue (moderate). Negative for appetite change.  Eyes: Negative for eye problems.  Respiratory: Negative for shortness of breath.   Gastrointestinal: Negative for diarrhea.  Skin: Negative for wound.  Neurological: Negative for headaches.  Psychiatric/Behavioral: Positive for sleep disturbance.  All other systems reviewed and are negative.   PAST MEDICAL/SURGICAL HISTORY:  Past Medical History:  Diagnosis Date  . Actinic keratosis   . Arthritis   . Colitis MAY 2012    CT ABD/PELVIS HEP FLEXURE  .  COPD (chronic obstructive pulmonary disease) ([facility])   . Diabetes mellitus without complication ([facility])   . History of kidney stones   . Hyperlipemia   . Hypertension   . NSAID long-term use NAPROXEN FOR OA  . SCL Ca dx'd 08/2018   Past Surgical History:  Procedure Laterality Date  . BACK SURGERY     spinal stenosis  . BASAL CELL CARCINOMA EXCISION  FACE, arms feet, leg  . CHOLECYSTECTOMY  JUNE 2011 MJ   STONES, PANCREATITIS  . KNEE ARTHROSCOPY WITH MEDIAL MENISECTOMY Right 03/24/2015   Procedure: KNEE ARTHROSCOPY WITH MEDIAL MENISECTOMY;  Surgeon: Carole Civil, MD;  Location: [facility];  Service: Orthopedics;  Laterality: Right;  . KNEE SURGERY Left LEFT   arthroscopy  . LITHOTRIPSY  80s  . PORTACATH PLACEMENT Left 09/11/2018   Procedure: INSERTION PORT-A-CATH;  Surgeon: Virl Cagey, MD;  Location: [facility];  Service: General;  Laterality: Left;  . SPINE SURGERY      SOCIAL HISTORY:  Social History   Socioeconomic History  . Marital status: Divorced    Spouse name: Not on file  . Number of children: 0  . Years of education: Not on file  . Highest education level: Not on file  Occupational History  . Occupation: [place] [employer]   . Occupation: Manufacturing engineer   Tobacco Use  . Smoking status: Former  Smoker    Packs/day: 1.00    Years: 50.00    Pack years: 50.00    Types: Cigarettes    Quit date: 08/07/2018    Years since quitting: 1.9  . Smokeless tobacco: Never Used  Substance and Sexual Activity  . Alcohol use: Yes    Comment: once a month  . Drug use: No  . Sexual activity: Never    Birth control/protection: None  Other Topics Concern  . 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   Social Determinants of Health   Financial Resource Strain: Not on file  Food Insecurity: Not on file  Transportation Needs: Not on file  Physical Activity: Not on file  Stress: Not  on file  Social Connections: Not on file  Intimate Partner Violence: Not on file    FAMILY HISTORY:  Family History  Problem Relation Age of Onset  . Cancer Mother        lung  . Cancer Father        lung and liver  . Colon polyps Neg Hx   . Colon cancer Neg Hx     CURRENT MEDICATIONS:  Current Outpatient Medications  Medication Sig Dispense Refill  . albuterol (PROVENTIL HFA;VENTOLIN HFA) 108 (90 Base) MCG/ACT inhaler Inhale 2 puffs into the lungs every 6 (six) hours as needed for wheezing or shortness of breath.     Marland Kitchen aspirin 81 MG tablet Take 81 mg by mouth daily.      . Cholecalciferol 50 MCG (2000 UT) TBDP Take 1 tablet by mouth daily.    Marland Kitchen docusate sodium (COLACE) 100 MG capsule Take 100 mg by mouth 2 (two) times daily.    Hunt Oris (IMFINZI IV) Inject into the vein every 21 ( twenty-one) days. (Patient not taking: No sig reported)    . levothyroxine (SYNTHROID) 150 MCG tablet Take 150 mcg by mouth daily.    . metFORMIN (GLUCOPHAGE) 500 MG tablet Take 500 mg by mouth 2 (two) times daily with a meal.    . rosuvastatin (CRESTOR) 10 MG tablet Take 10 mg by mouth daily.      Marland Kitchen telmisartan (MICARDIS) 40 MG tablet Take 40 mg by mouth daily.    . verapamil (COVERA HS) 240 MG (CO) 24 hr tablet Take 120 mg by mouth at bedtime.      Current Facility-Administered Medications  Medication Dose Route Frequency Provider Last Rate Last Admin  . influenza vaccine adjuvanted (FLUAD) injection 0.5 mL  0.5 mL Intramuscular Once Diyana Starrett, Arletha Pili, MD        ALLERGIES:  No Known Allergies  PHYSICAL EXAM:  Performance status (ECOG): 1 - Symptomatic but completely ambulatory  Vitals:   07/23/20 1201  BP: 121/77  Pulse: 77  Resp: 16  SpO2: 97%   Wt Readings from Last 3 Encounters:  07/23/20 178 lb 14.4 oz (81.1 kg)  06/29/20 177 lb 4.8 oz (80.4 kg)  03/16/20 171 lb (77.6 kg)   Physical Exam Vitals reviewed.  Constitutional:      Appearance: Normal appearance.  Cardiovascular:      Rate and Rhythm: Normal rate and regular rhythm.     Pulses: Normal pulses.     Heart sounds: Normal heart sounds.  Pulmonary:     Effort: Pulmonary effort is normal.     Breath sounds: Normal breath sounds.  Skin:    Findings:  No abrasion.  Neurological:     General: No focal deficit present.     Mental Status: He is alert and oriented to person, place, and time.  Psychiatric:        Mood and Affect: Mood normal.        Behavior: Behavior normal.      LABORATORY DATA:  I have reviewed the labs as listed.  CBC Latest Ref Rng & Units 07/14/2020 03/09/2020 11/28/2019  WBC 4.0 - 10.5 K/uL 8.7 9.1 8.9  Hemoglobin 13.0 - 17.0 g/dL 15.0 14.4 13.5  Hematocrit 39.0 - 52.0 % 45.8 43.9 41.3  Platelets 150 - 400 K/uL 261 251 210   CMP Latest Ref Rng & Units 07/14/2020 03/09/2020 11/28/2019  Glucose 70 - 99 mg/dL 101(H) 84 89  BUN 8 - 23 mg/dL 10 15 16   Creatinine 0.61 - 1.24 mg/dL 0.86 0.79 0.92  Sodium 135 - 145 mmol/L 138 138 140  Potassium 3.5 - 5.1 mmol/L 4.0 4.4 4.0  Chloride 98 - 111 mmol/L 101 100 102  CO2 22 - 32 mmol/L 29 28 29   Calcium 8.9 - 10.3 mg/dL 9.2 9.5 9.1  Total Protein 6.5 - 8.1 g/dL 7.0 7.5 6.7  Total Bilirubin 0.3 - 1.2 mg/dL 1.2 0.9 0.6  Alkaline Phos 38 - 126 U/L 87 60 69  AST 15 - 41 U/L 14(L) 16 16  ALT 0 - 44 U/L 8 10 12    Lab Results  Component Value Date   LDH 131 03/09/2020   LDH 144 04/22/2019    DIAGNOSTIC IMAGING:  I have independently reviewed the scans and discussed with the patient. CT CHEST ABDOMEN PELVIS W CONTRAST  Result Date: 07/15/2020 CLINICAL DATA:  Restaging small cell lung cancer. EXAM: CT CHEST, ABDOMEN, AND PELVIS WITH CONTRAST TECHNIQUE: Multidetector CT imaging of the chest, abdomen and pelvis was performed following the standard protocol during bolus administration of intravenous contrast. CONTRAST:  124mL OMNIPAQUE IOHEXOL 300 MG/ML  SOLN COMPARISON:  03/09/2020 FINDINGS: CT CHEST FINDINGS Cardiovascular: The heart is normal in  size. No pericardial effusion. Stable tortuosity, ectasia and calcification of the thoracic aorta. Stable fusiform enlargement of the ascending aorta with maximum measurement of 4.2 cm. No dissection. Stable advanced three-vessel coronary artery calcifications. The pulmonary arteries are unremarkable. Mediastinum/Nodes: No mediastinal or hilar mass or adenopathy the. The esophagus is grossly normal. Lungs/Pleura: Stable underlying emphysematous changes and pulmonary scarring. Stable fairly extensive changes of radiation fibrosis involving. Mediastinal lungs bilaterally. No findings suspicious for recurrent tumor. No worrisome pulmonary nodules to suggest pulmonary metastatic disease. No pleural effusions or pleural nodules. Musculoskeletal: No chest wall mass, supraclavicular or axillary adenopathy. The bony thorax is intact. No worrisome bone lesions. CT ABDOMEN PELVIS FINDINGS Hepatobiliary: No hepatic lesions to suggest metastatic disease. The gallbladder is surgically absent. No common bile duct dilatation. Pancreas: No mass, inflammation or ductal dilatation. Stable advanced atrophy. Spleen: Normal size. No focal lesions. Adrenals/Urinary Tract: Stable small adrenal gland nodules consistent with benign adenomas. Stable renal cysts. No worrisome renal lesions or hydronephrosis. Stomach/Bowel: The stomach, duodenum, small bowel and colon are unremarkable. No acute inflammatory changes, mass lesions or obstructive findings. The terminal ileum is normal. The appendix is normal. Vascular/Lymphatic: Stable advanced atherosclerotic calcifications involving the aorta and branch vessels but no aneurysm or dissection. The major venous structures are patent. No mesenteric or retroperitoneal mass or adenopathy. Reproductive: Stable enlarged prostate gland with median lobe hypertrophy impressing on the base of the bladder. The seminal vesicles are unremarkable. Other: No  pelvic mass or adenopathy. No free pelvic fluid  collections. No inguinal mass or adenopathy. No abdominal wall hernia or subcutaneous lesions. Musculoskeletal: No significant bony findings. No worrisome lytic or sclerotic bone lesions. IMPRESSION: 1. Stable radiation fibrosis involving the lungs. No findings suspicious for recurrent tumor, mediastinal/hilar adenopathy or metastatic disease. 2. Stable advanced atherosclerotic calcifications involving the thoracic and abdominal aorta and branch vessels including the coronary arteries. 3. Stable small bilateral adrenal gland adenomas. 4. Stable enlarged prostate gland with median lobe hypertrophy impressing on the base of the bladder. 5. Emphysema and aortic atherosclerosis. Aortic Atherosclerosis (ICD10-I70.0) and Emphysema (ICD10-J43.9). Electronically Signed   By: Marijo Sanes M.D.   On: 07/15/2020 08:53    06/16/2020  IMPRESSION: 1. Although intravenous contrast agent was administered, there is no sinonasal mucosal enhancement, suggesting that the contrast agent head not reached the head at the time of acquisition, either due to technical factors or decreased cardiac output. Therefore, assessment for intracranial metastases is severely limited. 2. Punctate focus of fusion restriction at the medial surface of the right lateral ventricle temporal horn, nonspecific. This may indicate xanthogranulomatous material in the right choroid plexus, but an ependymal metastasis would be difficult to exclude on this study. 3. Diffuse white matter changes, due to radiation.  ASSESSMENT/PLAN 1.  Extensive stage small cell lung cancer:  -4 cycles of carboplatin, VP-16 and durvalumab from 08/23/2018 through 10/28/2018. -Durvalumab maintenance last dose on 02/13/2019, subsequent doses held due to colitis. -We reviewed results and images of the CT scan and MRI brain from Dec 2021, no obvious concerns for recurrence. -I have reviewed his labs which are grossly within normal limits.  - Dr Raliegh Ip will see him back in 3  months with repeat labs and scans.   2.  Metastatic disease to the brain: -He had 3 small subcentimeter meta stasis in the right insula, right occipital lobe and right cerebellar hemisphere, treated with whole brain RT from 09/02/2018 through 09/13/2018. -MRI of the brain Nov 2021 showed no concern for obvious mets. Some non specific changes reported. - He has no new neurologic symptoms reported.  3. Colitis:  -He experienced diarrhea with blood when he was on durvalumab and was treated with prednisone in the first week of August 2020, gradually tapered off around 04/27/2019. -He does not have any GI symptoms at this time.  Benay Pike MD

## 2020-08-07 DIAGNOSIS — Z7982 Long term (current) use of aspirin: Secondary | ICD-10-CM | POA: Insufficient documentation

## 2020-09-24 ENCOUNTER — Encounter (HOSPITAL_COMMUNITY): Payer: Self-pay

## 2020-09-24 ENCOUNTER — Other Ambulatory Visit: Payer: Self-pay

## 2020-09-24 ENCOUNTER — Observation Stay (HOSPITAL_COMMUNITY)
Admission: EM | Admit: 2020-09-24 | Discharge: 2020-09-25 | Disposition: A | Discharge status: Home / Self Care | Payer: Medicare Other | Attending: Family Medicine | Admitting: Family Medicine

## 2020-09-24 ENCOUNTER — Emergency Department (HOSPITAL_COMMUNITY): Payer: Medicare Other

## 2020-09-24 DIAGNOSIS — J449 Chronic obstructive pulmonary disease, unspecified: Secondary | ICD-10-CM | POA: Insufficient documentation

## 2020-09-24 DIAGNOSIS — C3401 Malignant neoplasm of right main bronchus: Secondary | ICD-10-CM | POA: Diagnosis not present

## 2020-09-24 DIAGNOSIS — Z20822 Contact with and (suspected) exposure to covid-19: Secondary | ICD-10-CM | POA: Insufficient documentation

## 2020-09-24 DIAGNOSIS — I4430 Unspecified atrioventricular block: Secondary | ICD-10-CM

## 2020-09-24 DIAGNOSIS — C3491 Malignant neoplasm of unspecified part of right bronchus or lung: Secondary | ICD-10-CM | POA: Diagnosis not present

## 2020-09-24 DIAGNOSIS — Z87891 Personal history of nicotine dependence: Secondary | ICD-10-CM | POA: Diagnosis not present

## 2020-09-24 DIAGNOSIS — Z791 Long term (current) use of non-steroidal anti-inflammatories (NSAID): Secondary | ICD-10-CM | POA: Diagnosis not present

## 2020-09-24 DIAGNOSIS — E785 Hyperlipidemia, unspecified: Secondary | ICD-10-CM | POA: Insufficient documentation

## 2020-09-24 DIAGNOSIS — R531 Weakness: Secondary | ICD-10-CM

## 2020-09-24 DIAGNOSIS — Z79899 Other long term (current) drug therapy: Secondary | ICD-10-CM | POA: Insufficient documentation

## 2020-09-24 DIAGNOSIS — C7931 Secondary malignant neoplasm of brain: Secondary | ICD-10-CM | POA: Diagnosis not present

## 2020-09-24 DIAGNOSIS — R269 Unspecified abnormalities of gait and mobility: Secondary | ICD-10-CM | POA: Diagnosis present

## 2020-09-24 DIAGNOSIS — I10 Essential (primary) hypertension: Secondary | ICD-10-CM | POA: Insufficient documentation

## 2020-09-24 DIAGNOSIS — R262 Difficulty in walking, not elsewhere classified: Secondary | ICD-10-CM | POA: Diagnosis not present

## 2020-09-24 DIAGNOSIS — E119 Type 2 diabetes mellitus without complications: Secondary | ICD-10-CM | POA: Insufficient documentation

## 2020-09-24 DIAGNOSIS — Z794 Long term (current) use of insulin: Secondary | ICD-10-CM | POA: Insufficient documentation

## 2020-09-24 DIAGNOSIS — Z7189 Other specified counseling: Secondary | ICD-10-CM

## 2020-09-24 LAB — CBC WITH DIFFERENTIAL/PLATELET
Abs Immature Granulocytes: 0.03 10*3/uL (ref 0.00–0.07)
Basophils Absolute: 0 10*3/uL (ref 0.0–0.1)
Basophils Relative: 0 %
Eosinophils Absolute: 0 10*3/uL (ref 0.0–0.5)
Eosinophils Relative: 0 %
HCT: 41.1 % (ref 39.0–52.0)
Hemoglobin: 13.5 g/dL (ref 13.0–17.0)
Immature Granulocytes: 0 %
Lymphocytes Relative: 19 %
Lymphs Abs: 1.6 10*3/uL (ref 0.7–4.0)
MCH: 32.7 pg (ref 26.0–34.0)
MCHC: 32.8 g/dL (ref 30.0–36.0)
MCV: 99.5 fL (ref 80.0–100.0)
Monocytes Absolute: 0.5 10*3/uL (ref 0.1–1.0)
Monocytes Relative: 6 %
Neutro Abs: 6 10*3/uL (ref 1.7–7.7)
Neutrophils Relative %: 75 %
Platelets: 257 10*3/uL (ref 150–400)
RBC: 4.13 MIL/uL — ABNORMAL LOW (ref 4.22–5.81)
RDW: 14.8 % (ref 11.5–15.5)
WBC: 8.1 10*3/uL (ref 4.0–10.5)
nRBC: 0 % (ref 0.0–0.2)

## 2020-09-24 LAB — COMPREHENSIVE METABOLIC PANEL
ALT: 10 U/L (ref 0–44)
AST: 16 U/L (ref 15–41)
Albumin: 4.1 g/dL (ref 3.5–5.0)
Alkaline Phosphatase: 56 U/L (ref 38–126)
Anion gap: 8 (ref 5–15)
BUN: 16 mg/dL (ref 8–23)
CO2: 24 mmol/L (ref 22–32)
Calcium: 9 mg/dL (ref 8.9–10.3)
Chloride: 107 mmol/L (ref 98–111)
Creatinine, Ser: 0.77 mg/dL (ref 0.61–1.24)
GFR, Estimated: >60 mL/min (ref >60)
Glucose, Bld: 71 mg/dL (ref 70–99)
Potassium: 3.5 mmol/L (ref 3.5–5.1)
Sodium: 139 mmol/L (ref 135–145)
Total Bilirubin: 1.4 mg/dL — ABNORMAL HIGH (ref 0.3–1.2)
Total Protein: 6.5 g/dL (ref 6.5–8.1)

## 2020-09-24 LAB — URINALYSIS, ROUTINE W REFLEX MICROSCOPIC
Bilirubin Urine: NEGATIVE
Glucose, UA: NEGATIVE mg/dL
Hgb urine dipstick: NEGATIVE
Ketones, ur: 20 mg/dL — AB
Leukocytes,Ua: NEGATIVE
Nitrite: NEGATIVE
Protein, ur: NEGATIVE mg/dL
Specific Gravity, Urine: 1.017 (ref 1.005–1.030)
pH: 5 (ref 5.0–8.0)

## 2020-09-24 LAB — CBC
HCT: 44.4 % (ref 39.0–52.0)
Hemoglobin: 14.4 g/dL (ref 13.0–17.0)
MCH: 32.4 pg (ref 26.0–34.0)
MCHC: 32.4 g/dL (ref 30.0–36.0)
MCV: 100 fL (ref 80.0–100.0)
Platelets: 255 10*3/uL (ref 150–400)
RBC: 4.44 MIL/uL (ref 4.22–5.81)
RDW: 14.6 % (ref 11.5–15.5)
WBC: 8.9 10*3/uL (ref 4.0–10.5)
nRBC: 0 % (ref 0.0–0.2)

## 2020-09-24 LAB — POC SARS CORONAVIRUS 2 AG -  ED: SARS Coronavirus 2 Ag: NEGATIVE

## 2020-09-24 LAB — CREATININE, SERUM
Creatinine, Ser: 0.84 mg/dL (ref 0.61–1.24)
GFR, Estimated: >60 mL/min (ref >60)

## 2020-09-24 LAB — TROPONIN I (HIGH SENSITIVITY)
Troponin I (High Sensitivity): 9 ng/L (ref <18)
Troponin I (High Sensitivity): 10 ng/L (ref <18)

## 2020-09-24 MED ORDER — VITAMIN D 25 MCG (1000 UNIT) PO TABS
2000.0000 [IU] | ORAL_TABLET | Freq: Every day | ORAL | Status: DC
  Administered 2020-09-25: 2000 [IU] via ORAL
  Filled 2020-09-24: qty 2

## 2020-09-24 MED ORDER — IRBESARTAN 150 MG PO TABS
150.0000 mg | ORAL_TABLET | Freq: Every day | ORAL | Status: DC
  Administered 2020-09-25: 150 mg via ORAL
  Filled 2020-09-24: qty 1

## 2020-09-24 MED ORDER — ENOXAPARIN SODIUM 40 MG/0.4ML ~~LOC~~ SOLN
40.0000 mg | SUBCUTANEOUS | Status: DC
  Administered 2020-09-24: 40 mg via SUBCUTANEOUS
  Filled 2020-09-24: qty 0.4

## 2020-09-24 MED ORDER — ACETAMINOPHEN 650 MG RE SUPP: 650.0000 mg | Freq: Four times a day (QID) | RECTAL | Status: DC | PRN

## 2020-09-24 MED ORDER — VERAPAMIL HCL 240 MG (CO) PO TB24: 240.0000 mg | ORAL_TABLET | Freq: Every day | ORAL | Status: DC

## 2020-09-24 MED ORDER — ROSUVASTATIN CALCIUM 10 MG PO TABS
10.0000 mg | ORAL_TABLET | Freq: Every day | ORAL | Status: DC
  Administered 2020-09-25: 10 mg via ORAL
  Filled 2020-09-24: qty 1

## 2020-09-24 MED ORDER — LEVOTHYROXINE SODIUM 100 MCG PO TABS
200.0000 ug | ORAL_TABLET | Freq: Every day | ORAL | Status: DC
  Administered 2020-09-25: 200 ug via ORAL
  Filled 2020-09-24: qty 2

## 2020-09-24 MED ORDER — ACETAMINOPHEN 325 MG PO TABS: 650.0000 mg | ORAL_TABLET | Freq: Four times a day (QID) | ORAL | Status: DC | PRN

## 2020-09-24 MED ORDER — ADULT MULTIVITAMIN W/MINERALS CH
1.0000 | ORAL_TABLET | Freq: Every day | ORAL | Status: DC
  Administered 2020-09-25: 1 via ORAL
  Filled 2020-09-24: qty 1

## 2020-09-24 MED ORDER — CHOLECALCIFEROL 50 MCG (2000 UT) PO TBDP: 1.0000 | ORAL_TABLET | Freq: Every day | ORAL | Status: DC

## 2020-09-24 MED ORDER — THIAMINE HCL 100 MG PO TABS
100.0000 mg | ORAL_TABLET | Freq: Every day | ORAL | Status: DC
  Administered 2020-09-25: 100 mg via ORAL
  Filled 2020-09-24: qty 1

## 2020-09-24 MED ORDER — METFORMIN HCL 500 MG PO TABS
500.0000 mg | ORAL_TABLET | Freq: Two times a day (BID) | ORAL | Status: DC
  Administered 2020-09-25: 500 mg via ORAL
  Filled 2020-09-24: qty 1

## 2020-09-24 MED ORDER — ONDANSETRON HCL 4 MG/2ML IJ SOLN: 4.0000 mg | Freq: Four times a day (QID) | INTRAMUSCULAR | Status: DC | PRN

## 2020-09-24 MED ORDER — VERAPAMIL HCL ER 240 MG PO TBCR
240.0000 mg | EXTENDED_RELEASE_TABLET | Freq: Every day | ORAL | Status: DC
  Administered 2020-09-24: 240 mg via ORAL
  Filled 2020-09-24: qty 1

## 2020-09-24 MED ORDER — ASPIRIN EC 81 MG PO TBEC
81.0000 mg | DELAYED_RELEASE_TABLET | Freq: Every day | ORAL | Status: DC
Start: 2020-09-24 — End: 2020-09-25
  Administered 2020-09-24 – 2020-09-25 (×2): 81 mg via ORAL
  Filled 2020-09-24 (×2): qty 1

## 2020-09-24 MED ORDER — ALBUTEROL SULFATE HFA 108 (90 BASE) MCG/ACT IN AERS: 2.0000 | INHALATION_SPRAY | Freq: Four times a day (QID) | RESPIRATORY_TRACT | Status: DC | PRN

## 2020-09-24 MED ORDER — ONDANSETRON HCL 4 MG PO TABS: 4.0000 mg | ORAL_TABLET | Freq: Four times a day (QID) | ORAL | Status: DC | PRN

## 2020-09-24 MED ORDER — IRBESARTAN 75 MG PO TABS: 75.0000 mg | ORAL_TABLET | Freq: Every day | ORAL | Status: DC

## 2020-09-24 NOTE — H&P (Addendum)
History and Physical    KADEEM HYLE BBC:[id number] DOB: [birth year] DOA: 09/24/2020  PCP: Lucia Gaskins, MD   Patient coming from: Home  Chief Complaint:  Ambulatory dysfunction   HPI: MAXIMILLIAN HABIBI is a 75 y.o. male with medical history significant of COPD, type 2 diabetes mellitus, hypertension, dyslipidemia, and small cell lung cancer. Patient reports chronic generalized weakness that has been ongoing since he completed his radiation therapy and chemotherapy for lung cancer in 2020.  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 He completed therapy 10/2018, immunotherapy was held 02/2019 due to colitis Follow-up visit 07/2020, no obvious concerns for recurrence.   He lives at home with his brother who takes care of him.  Lately he has noticed worsening lower extremity weakness.  Today while walking from his bedroom to the bathroom he fell.  He denies any head trauma loss of consciousness. He was unable to stand back on his feet, when his brother came back home from work saw him in the floor and called EMS.  His weakness seemed to be predominantly at his lower extremities, worse with ambulation, no improving or worsening factors, no associated pain.  He was brought into the hospital for further evaluation.   ED Course: Patient had extensive work-up, so far negative, patient was unable to ambulate without assistance.  It was considered unsafe patient to return home, high risk of falls.  He was referred for admission for evaluation.  Review of Systems:  1. General: No fevers, no chills, no weight gain or weight loss.  2. ENT: No runny nose or sore throat, no hearing disturbances 3. Pulmonary: No dyspnea, cough, wheezing, or hemoptysis 4. Cardiovascular: No angina, claudication, lower extremity edema, pnd or orthopnea 5. Gastrointestinal: No nausea or vomiting, no diarrhea or constipation 6. Hematology: No easy  bruisability or frequent infections 7. Urology: No dysuria, hematuria or increased urinary frequency 8. Dermatology: No rashes. 9. Neurology: No seizures or paresthesias. Lower extremity weakness.  10. Musculoskeletal: No joint pain or deformities  Past Medical History:  Diagnosis Date  . Actinic keratosis   . Arthritis   . Colitis MAY 2012    CT ABD/PELVIS HEP FLEXURE  . COPD (chronic obstructive pulmonary disease) ([place])   . Diabetes mellitus without complication ([place])   . History of kidney stones   . Hyperlipemia   . Hypertension   . NSAID long-term use NAPROXEN FOR OA  . SCL Ca dx'd 08/2018    Past Surgical History:  Procedure Laterality Date  . BACK SURGERY     spinal stenosis  . BASAL CELL CARCINOMA EXCISION  FACE, arms feet, leg  . CHOLECYSTECTOMY  JUNE 2011 MJ   STONES, PANCREATITIS  . KNEE ARTHROSCOPY WITH MEDIAL MENISECTOMY Right 03/24/2015   Procedure: KNEE ARTHROSCOPY WITH MEDIAL MENISECTOMY;  Surgeon: Carole Civil, MD;  Location: [facility];  Service: Orthopedics;  Laterality: Right;  . KNEE SURGERY Left LEFT   arthroscopy  . LITHOTRIPSY  80s  . PORTACATH PLACEMENT Left 09/11/2018   Procedure: INSERTION PORT-A-CATH;  Surgeon: Virl Cagey, MD;  Location: [facility];  Service: General;  Laterality: Left;  . SPINE SURGERY       reports that he quit smoking about 2 years ago. His smoking use included cigarettes. He has a 50.00 pack-year smoking history. He has never used smokeless tobacco. He reports current alcohol use. He reports that he does not use drugs.  No Known Allergies  Family History  Problem Relation Age of Onset  . Cancer Mother        lung  . Cancer Father        lung and liver  . Colon polyps Neg Hx   . Colon cancer Neg Hx      Prior to Admission medications   Medication Sig Start Date End Date Taking? Authorizing Provider  albuterol (PROVENTIL HFA;VENTOLIN HFA) 108 (90 Base) MCG/ACT inhaler Inhale 2 puffs into the lungs every 6 (six)  hours as needed for wheezing or shortness of breath.  06/22/18  Yes [provider]  aspirin 81 MG tablet Take 81 mg by mouth daily.   Yes [provider]  Cholecalciferol 50 MCG (2000 UT) TBDP Take 1 tablet by mouth daily.   Yes [provider]  levothyroxine (SYNTHROID) 200 MCG tablet Take 200 mcg by mouth daily. 09/03/20  Yes [provider]  metFORMIN (GLUCOPHAGE) 500 MG tablet Take 500 mg by mouth 2 (two) times daily with a meal.   Yes [provider]  rosuvastatin (CRESTOR) 10 MG tablet Take 10 mg by mouth daily.   Yes [provider]  verapamil (COVERA HS) 240 MG (CO) 24 hr tablet Take 120 mg by mouth at bedtime.   Yes [provider]  docusate sodium (COLACE) 100 MG capsule Take 100 mg by mouth 2 (two) times daily. Patient not taking: No sig reported    [provider]  Durvalumab (IMFINZI IV) Inject into the vein every 21 ( twenty-one) days. Patient not taking: No sig reported    [provider]  telmisartan (MICARDIS) 40 MG tablet Take 40 mg by mouth daily. 04/23/20   [provider]    Physical Exam: Vitals:   09/24/20 1445 09/24/20 1500 09/24/20 1630 09/24/20 1728  BP: (!) 151/114  (!) 144/87 (!) 140/98  Pulse: 86 87 (!) 102 92  Resp: 16 (!) 21 18 17   Temp:   98 F (36.7 C) 98.1 F (36.7 C)  TempSrc:   Oral Oral  SpO2: 96% 95% 96% 98%  Weight:        Vitals:   09/24/20 1445 09/24/20 1500 09/24/20 1630 09/24/20 1728  BP: (!) 151/114  (!) 144/87 (!) 140/98  Pulse: 86 87 (!) 102 92  Resp: 16 (!) 21 18 17   Temp:   98 F (36.7 C) 98.1 F (36.7 C)  TempSrc:   Oral Oral  SpO2: 96% 95% 96% 98%  Weight:       General: Not in pain or dyspnea, deconditioned  Neurology: Awake and alert, non focal. Strength is preserved upper and lower extremities. Appropriate coordination.  Per ED patient not able to ambulate without assistance.  Head and Neck. Head normocephalic. Neck supple with no  adenopathy or thyromegaly.   E ENT: mild pallor, no icterus, oral mucosa dry.  Cardiovascular: No JVD. S1-S2 present, rhythmic, no gallops, rubs, or murmurs. No lower extremity edema. Pulmonary: positive breath sounds bilaterally, adequate air movement, no wheezing, rhonchi or rales. Gastrointestinal. Abdomen soft and non tender Skin. No rashes Musculoskeletal: no joint deformities    Labs on Admission: I have personally reviewed following labs and imaging studies  CBC: Recent Labs  Lab 09/24/20 0935  WBC 8.1  NEUTROABS 6.0  HGB 13.5  HCT 41.1  MCV 99.5  PLT 638   Basic Metabolic Panel: Recent Labs  Lab 09/24/20 0935  NA 139  K 3.5  CL 107  CO2 24  GLUCOSE 71  BUN  16  CREATININE 0.77  CALCIUM 9.0   GFR: Estimated Creatinine Clearance: 81 mL/min (by C-G formula based on SCr of 0.77 mg/dL). Liver Function Tests: Recent Labs  Lab 09/24/20 0935  AST 16  ALT 10  ALKPHOS 56  BILITOT 1.4*  PROT 6.5  ALBUMIN 4.1   No results for input(s): LIPASE, AMYLASE in the last 168 hours. No results for input(s): AMMONIA in the last 168 hours. Coagulation Profile: No results for input(s): INR, PROTIME in the last 168 hours. Cardiac Enzymes: No results for input(s): CKTOTAL, CKMB, CKMBINDEX, TROPONINI in the last 168 hours. BNP (last 3 results) No results for input(s): PROBNP in the last 8760 hours. HbA1C: No results for input(s): HGBA1C in the last 72 hours. CBG: No results for input(s): GLUCAP in the last 168 hours. Lipid Profile: No results for input(s): CHOL, HDL, LDLCALC, TRIG, CHOLHDL, LDLDIRECT in the last 72 hours. Thyroid Function Tests: No results for input(s): TSH, T4TOTAL, FREET4, T3FREE, THYROIDAB in the last 72 hours. Anemia Panel: No results for input(s): VITAMINB12, FOLATE, FERRITIN, TIBC, IRON, RETICCTPCT in the last 72 hours. Urine analysis:    Component Value Date/Time   COLORURINE YELLOW 09/24/2020 [place] 09/24/2020 1349    LABSPEC 1.017 09/24/2020 1349   PHURINE 5.0 09/24/2020 1349   GLUCOSEU NEGATIVE 09/24/2020 1349   HGBUR NEGATIVE 09/24/2020 1349   BILIRUBINUR NEGATIVE 09/24/2020 1349   KETONESUR 20 (A) 09/24/2020 1349   PROTEINUR NEGATIVE 09/24/2020 1349   UROBILINOGEN 0.2 12/22/2010 1203   NITRITE NEGATIVE 09/24/2020 1349   LEUKOCYTESUR NEGATIVE 09/24/2020 1349    Radiological Exams on Admission: CT Head Wo Contrast  Result Date: 09/24/2020 CLINICAL DATA:  Dizziness. EXAM: CT HEAD WITHOUT CONTRAST TECHNIQUE: Contiguous axial images were obtained from the base of the skull through the vertex without intravenous contrast. COMPARISON:  August 21, 2018. FINDINGS: Brain: Mild chronic ischemic white matter disease is noted. Minimal diffuse cortical atrophy is noted. No mass effect or midline shift is noted. Ventricular size is within normal limits. There is no evidence of mass lesion, hemorrhage or acute infarction. Vascular: No hyperdense vessel or unexpected calcification. Skull: Normal. Negative for fracture or focal lesion. Sinuses/Orbits: No acute finding. Other: None. IMPRESSION: Mild chronic ischemic white matter disease. Minimal diffuse cortical atrophy. No acute intracranial abnormality seen. Electronically Signed   By: Marijo Conception M.D.   On: 09/24/2020 09:59   DG Chest Portable 1 View  Result Date: 09/24/2020 CLINICAL DATA:  Cough EXAM: PORTABLE CHEST 1 VIEW COMPARISON:  09/11/2018 FINDINGS: Perihilar radiation fibrosis. Mildly increased density over the right upper chest likely from pleural based scarring by December 2021 CT. Normal heart size and stable aortic tortuosity. Left-sided port in place. There is no edema, consolidation, effusion, or pneumothorax. IMPRESSION: No acute finding when compared to prior. Electronically Signed   By: Monte Fantasia M.D.   On: 09/24/2020 10:09    EKG: Independently reviewed.  75 bpm, left axis deviation, normal intervals, sinus rhythm, no ST segment or T wave  changes.  Assessment/Plan Principal Problem:   Ambulatory dysfunction Active Problems:   Small cell carcinoma of lung, right (HCC)   Brain metastasis (HCC)   Chronic obstructive pulmonary disease (HCC)   Type 2 diabetes mellitus without complication ([place])   75 year old male with significant past medical history for extensive small cell carcinoma with history of brain metastases.  Who presents with difficulty ambulating.  No syncope. On his initial physical examination blood pressure 176/92, heart rate 71, respiratory  rate 19, oxygen saturation 97% on room air.  Negative orthostatics.  Dry mucous membranes, lungs clear to auscultation, heart S1-S2, present, rhythmic, soft abdomen, no lower extremity edema, his strength and coordination is preserved all 4 extremities.  Sodium 139, potassium 3.5, chloride 107, bicarb 24, glucose 71, BUN 16, creatinine 0.77, white count 8.1, hemoglobin 13.5, hematocrit 41.1, platelets 257. SARS COVID-19 pending. Urinalysis specific gravity 1.017. Head CT with mild chronic ischemic white matter disease, minimal diffuse cortical atrophy, no acute changes. Chest radiograph with hyperinflation, right upper lobe scars.   Mr. Kistler will be admitted to the hospital with the working diagnosis of ambulatory dysfunction.  1.  Ambulatory dysfunction.  Likely multifactorial, patient has no frank focal neurologic deficit but was not able to ambulate without assistance in the emergency department. Currently it is considered unsafe patient discharged at risk of fall.  Admit patient for observation, consult physical therapy and Occupational Therapy.  Transitional care consult for further home health services. Consult nutrition, add thiamine and multivitamins.   2.  Type 2 diabetes mellitus/dyslipidemia.  His admission glucose is controlled, hold on insulin therapy for now. Resume Metformin. Continue rosuvastatin.  3.  Small cell lung cancer/ COPD.  Patient status post  chemotherapy and radiotherapy.  His last follow-up was December 2021. Continue outpatient follow-up with oncology.  No clinical signs of COPD exacerbation.   4.  Hypertension.  Continue blood pressure control with telmisartan and verapamil.  5.  Hypothyroidism.  Continue levothyroxine.  Status is: Observation  The patient remains OBS appropriate and will d/c before 2 midnights.  Dispo: The patient is from: Home              Anticipated d/c is to: Home              Anticipated d/c date is: 1 day              Patient currently is not medically stable to d/c.   Difficult to place patient No    DVT prophylaxis: Enoxaparin   Code Status:    full  Family Communication:  No family at the bedside     Consults called:  None Admission status:  Observation    Moroni Nester Gerome Apley MD [facility]   09/24/2020, 5:37 PM

## 2020-09-24 NOTE — ED Notes (Signed)
Pt ambulated with slight difficulty. Pt shuffled his feet. Pt states he uses a walking stick at home. Pt denied feeling dizzy while ambulating.

## 2020-09-24 NOTE — ED Notes (Signed)
Urinal provided to patient

## 2020-09-24 NOTE — ED Notes (Signed)
Pt standing in room doing push ups on the counter, pt states that he was tired of sitting in the bed, pt has pulled out his port access.  Needle found in pt's bedding, pt states that it's fine, we could just pt another one in, he has had a port for the past two year.

## 2020-09-24 NOTE — ED Triage Notes (Signed)
Pt reports that he has been falling for the past 3 days due to weakness. Pt reports his legs get and he gets dizzy

## 2020-09-24 NOTE — ED Provider Notes (Signed)
[facility] EMERGENCY DEPARTMENT Provider Note   CSN: [id number] Arrival date & time: 09/24/20  6384     History Chief Complaint  Patient presents with  . Weakness  . [name] is a 75 y.o. male.  HPI      [name] is a 75 y.o. male with past medical history of COPD, diabetes, hypertension and small cell lung carcinoma.  Not currently taking chemotherapy or radiation therapy.  He presents to the Emergency Department complaining of increasing generalized weakness and increased falls.  Generalized weakness has been going on for several weeks, but worse x2 to 3 days.  He states that when he attempts to stand or walk his legs "give way" causing him to fall.  He states that at times when he attempts to stand he becomes dizzy and feels sensation of things moving around him.  He denies fever, cough, chest pain, shortness of breath, headache, numbness of his face or extremities, no synciope.  No hx of stroke.  He denies injuries secondary to his recent falls. No known Covid exposures.  He is vaccinated for Covid  x2.   Past Medical History:  Diagnosis Date  . Actinic keratosis   . Arthritis   . Colitis MAY 2012    CT ABD/PELVIS HEP FLEXURE  . COPD (chronic obstructive pulmonary disease) ([place])   . Diabetes mellitus without complication ([place])   . History of kidney stones   . Hyperlipemia   . Hypertension   . NSAID long-term use NAPROXEN FOR OA  . SCL Ca dx'd 08/2018    Patient Active Problem List   Diagnosis Date Noted  . Other pulmonary embolism without acute cor pulmonale ([place]) 01/19/2020  . Chronic obstructive pulmonary disease ([place]) 01/19/2020  . Type 2 diabetes mellitus without complication ([place]) 66/59/9357  . Goals of care, counseling/discussion 09/02/2018  . Brain metastasis ([place]) 08/29/2018  . Small cell carcinoma of lung, right ([place])   . 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 DNR (do not resuscitate)  discussion 08/22/2018  . 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 Thoracic ascending aortic aneurysm ([place]) 08/21/2018  . Lymphadenopathy 08/21/2018  . Medial meniscus tear   . Primary osteoarthritis of knee   . Synovial plica of knee   . Colitis 01/25/2011  . Colon cancer screening 01/25/2011    Past Surgical History:  Procedure Laterality Date  . BACK SURGERY     spinal stenosis  . BASAL CELL CARCINOMA EXCISION  FACE, arms feet, leg  . CHOLECYSTECTOMY  JUNE 2011 MJ   STONES, PANCREATITIS  . KNEE ARTHROSCOPY WITH MEDIAL MENISECTOMY Right 03/24/2015   Procedure: KNEE ARTHROSCOPY WITH MEDIAL MENISECTOMY;  Surgeon: Carole Civil, MD;  Location: [facility];  Service: Orthopedics;  Laterality: Right;  . KNEE SURGERY Left LEFT   arthroscopy  . LITHOTRIPSY  80s  . PORTACATH PLACEMENT Left 09/11/2018   Procedure: INSERTION PORT-A-CATH;  Surgeon: John Cagey, MD;  Location: [facility];  Service: General;  Laterality: Left;  . SPINE SURGERY         Family History  Problem Relation Age of Onset  . Cancer Mother        lung  . Cancer Father        lung and liver  . Colon polyps Neg Hx   . Colon cancer Neg Hx     Social History   Tobacco Use  . Smoking status: Former Smoker  Packs/day: 1.00    Years: 50.00    Pack years: 50.00    Types: Cigarettes    Quit date: 08/07/2018    Years since quitting: 2.1  . Smokeless tobacco: Never Used  Substance Use Topics  . Alcohol use: Yes    Comment: once a month  . Drug use: No    Home Medications Prior to Admission medications   Medication Sig Start Date End Date Taking? Authorizing Provider  albuterol (PROVENTIL HFA;VENTOLIN HFA) 108 (90 Base) MCG/ACT inhaler Inhale 2 puffs into the lungs every 6 (six) hours as needed for wheezing or shortness of breath.  06/22/18   [provider]  aspirin 81 MG tablet Take 81 mg by mouth daily.      [provider]  Cholecalciferol 50 MCG (2000 UT) TBDP Take 1 tablet by mouth daily.     [provider]  docusate sodium (COLACE) 100 MG capsule Take 100 mg by mouth 2 (two) times daily.    [provider]  Durvalumab (IMFINZI IV) Inject into the vein every 21 ( twenty-one) days. Patient not taking: No sig reported    [provider]  levothyroxine (SYNTHROID) 150 MCG tablet Take 150 mcg by mouth daily. 06/23/20   [provider]  metFORMIN (GLUCOPHAGE) 500 MG tablet Take 500 mg by mouth 2 (two) times daily with a meal.    [provider]  rosuvastatin (CRESTOR) 10 MG tablet Take 10 mg by mouth daily.      [provider]  telmisartan (MICARDIS) 40 MG tablet Take 40 mg by mouth daily. 04/23/20   [provider]  verapamil (COVERA HS) 240 MG (CO) 24 hr tablet Take 120 mg by mouth at bedtime.     [provider]    Allergies    Patient has no known allergies.  Review of Systems   Review of Systems  Constitutional: Negative for chills, fatigue and fever.  HENT: Negative for sore throat and trouble swallowing.   Eyes: Negative for visual disturbance.  Respiratory: Negative for cough, shortness of breath and wheezing.   Cardiovascular: Negative for chest pain and palpitations.  Gastrointestinal: Negative for abdominal pain, nausea and vomiting.  Genitourinary: Negative for dysuria, flank pain and hematuria.  Musculoskeletal: Negative for arthralgias, back pain, myalgias, neck pain and neck stiffness.  Skin: Negative for rash.  Neurological: Positive for dizziness and weakness. Negative for seizures, syncope, numbness and headaches.  Hematological: Does not bruise/bleed easily.    Physical Exam Updated Vital Signs BP (!) 151/95   Pulse 66   Temp 98.4 F (36.9 C) (Oral)   Resp 16   Wt 75.8 kg   SpO2 98%   BMI 24.66 kg/m   Physical Exam Vitals and nursing note reviewed.  Constitutional:      Appearance: Normal appearance. He is not ill-appearing.  HENT:     Head: Atraumatic.     Mouth/Throat:      Mouth: Mucous membranes are moist.  Eyes:     Extraocular Movements: Extraocular movements intact.     Conjunctiva/sclera: Conjunctivae normal.     Pupils: Pupils are equal, round, and reactive to light.  Cardiovascular:     Rate and Rhythm: Normal rate and regular rhythm.     Pulses: Normal pulses.  Pulmonary:     Effort: Pulmonary effort is normal.     Breath sounds: Normal breath sounds.  Abdominal:     General: There is no distension.     Palpations: Abdomen is  soft.     Tenderness: There is no abdominal tenderness.  Musculoskeletal:        General: No swelling or signs of injury.     Cervical back: Normal range of motion.  Skin:    General: Skin is warm.     Capillary Refill: Capillary refill takes less than 2 seconds.     Findings: No rash.  Neurological:     Mental Status: He is alert.     GCS: GCS eye subscore is 4. GCS verbal subscore is 5. GCS motor subscore is 6.     Sensory: Sensation is intact.     Motor: Motor function is intact.     Comments: CN II-XII intact.  Slow, but appropriate finger nose testing.  No pronator drift.  No LE drift.       ED Results / Procedures / Treatments   Labs (all labs ordered are listed, but only abnormal results are displayed) Labs Reviewed  COMPREHENSIVE METABOLIC PANEL - Abnormal; Notable for the following components:      Result Value   Total Bilirubin 1.4 (*)    All other components within normal limits  CBC WITH DIFFERENTIAL/PLATELET - Abnormal; Notable for the following components:   RBC 4.13 (*)    All other components within normal limits  URINALYSIS, ROUTINE W REFLEX MICROSCOPIC - Abnormal; Notable for the following components:   Ketones, ur 20 (*)    All other components within normal limits  SARS CORONAVIRUS 2 (TAT 6-24 HRS)  POC SARS CORONAVIRUS 2 AG -  ED  TROPONIN I (HIGH SENSITIVITY)  TROPONIN I (HIGH SENSITIVITY)    EKG EKG Interpretation  Date/Time:  Friday September 24 2020 08:43:37  EST Ventricular Rate:  75 PR Interval:    QRS Duration: 106 QT Interval:  397 QTC Calculation: 444 R Axis:   -24 Text Interpretation: Sinus rhythm Borderline left axis deviation NSR, no change from previous Confirmed by Lavenia Atlas [id number]) on 09/24/2020 9:33:23 AM   Radiology CT Head Wo Contrast  Result Date: 09/24/2020 CLINICAL DATA:  Dizziness. EXAM: CT HEAD WITHOUT CONTRAST TECHNIQUE: Contiguous axial images were obtained from the base of the skull through the vertex without intravenous contrast. COMPARISON:  August 21, 2018. FINDINGS: Brain: Mild chronic ischemic white matter disease is noted. Minimal diffuse cortical atrophy is noted. No mass effect or midline shift is noted. Ventricular size is within normal limits. There is no evidence of mass lesion, hemorrhage or acute infarction. Vascular: No hyperdense vessel or unexpected calcification. Skull: Normal. Negative for fracture or focal lesion. Sinuses/Orbits: No acute finding. Other: None. IMPRESSION: Mild chronic ischemic white matter disease. Minimal diffuse cortical atrophy. No acute intracranial abnormality seen. Electronically Signed   By: Marijo Conception M.D.   On: 09/24/2020 09:59   DG Chest Portable 1 View  Result Date: 09/24/2020 CLINICAL DATA:  Cough EXAM: PORTABLE CHEST 1 VIEW COMPARISON:  09/11/2018 FINDINGS: Perihilar radiation fibrosis. Mildly increased density over the right upper chest likely from pleural based scarring by December 2021 CT. Normal heart size and stable aortic tortuosity. Left-sided port in place. There is no edema, consolidation, effusion, or pneumothorax. IMPRESSION: No acute finding when compared to prior. Electronically Signed   By: Monte Fantasia M.D.   On: 09/24/2020 10:09    Procedures Procedures   Medications Ordered in ED Medications - No data to display  ED Course  I have reviewed the triage vital signs and the nursing notes.  Pertinent labs & imaging results that were  available  during my care of the patient were reviewed by me and considered in my medical decision making (see chart for details).    MDM Rules/Calculators/A&P                          Patient who comes from home with generalized weakness and increased falls.  Weakness has been persistent since cancer diagnosis, but worse x2 to 3 days per patient.  Weakness has been associated with dizziness at times.  No syncope.  No chest pain or tachycardia.  Patient has questionable brain metastasis per note from oncology back in November 2021.  MRI from November without evidence of intracranial metastatic disease, but exam was limited due to poor contrast enhancement.    Chest x-ray, head CT today are reassuring.  Urinalysis unremarkable, troponin, Covid also unremarkable.  No evidence of leukocytosis and electrolytes unremarkable.  No evidence of orthostatic hypotension and no evidence to suggest acute cardiac process.  Pt seen by Dr. Fulton Reek and care plan discussed  We attempted to ambulate the patient and he is ataxic.  Having difficulty lifting and bearing weight on the right leg.  Patient does live at home with his brother, but brother works and is not home in the evening hours.  Source of weakness is unclear at this time although possible brain metastasis is high on the differential.  He will likely need admission for his weakness and possible repeat MRI of the brain for further evaluation.   Discussed findings with [facility] hospitalist, Dr. Cathlean Sauer, who agrees to admit.   Final Clinical Impression(s) / ED Diagnoses Final diagnoses:  Weakness    Rx / DC Orders ED Discharge Orders    None       Kem Parkinson, PA-C 09/24/20 1753    Lorelle Gibbs, DO 09/25/20 1655

## 2020-09-24 NOTE — ED Notes (Signed)
Pt sitting on the edge of the bed, resps even and unlabored.  pt reports some generalized pain, pt states that he is ready to go upstairs.

## 2020-09-24 NOTE — ED Notes (Signed)
Report called to RN Gilmore Laroche, pt awaits transport

## 2020-09-24 NOTE — ED Notes (Signed)
Talked with hospitalist, informed him of pt's current mobility, pt states that it would be up the the ed provider to d/c him, explained that pt was now admitted and it was his pt, he states to admit for observation. Explained that pt has gotten himself dressed and pulled out his port access, explained that pt has no access at this time, MD states that this is ok and he doesn't need access, asked if he wanted me to do anything to flush pt's port with heparin, md states that he doesn't know and to follow our protocol.

## 2020-09-25 DIAGNOSIS — R269 Unspecified abnormalities of gait and mobility: Secondary | ICD-10-CM | POA: Diagnosis not present

## 2020-09-25 DIAGNOSIS — J449 Chronic obstructive pulmonary disease, unspecified: Secondary | ICD-10-CM | POA: Diagnosis not present

## 2020-09-25 DIAGNOSIS — E119 Type 2 diabetes mellitus without complications: Secondary | ICD-10-CM | POA: Diagnosis not present

## 2020-09-25 DIAGNOSIS — C3491 Malignant neoplasm of unspecified part of right bronchus or lung: Secondary | ICD-10-CM | POA: Diagnosis not present

## 2020-09-25 DIAGNOSIS — R262 Difficulty in walking, not elsewhere classified: Secondary | ICD-10-CM | POA: Diagnosis not present

## 2020-09-25 LAB — BASIC METABOLIC PANEL
Anion gap: 10 (ref 5–15)
BUN: 21 mg/dL (ref 8–23)
CO2: 26 mmol/L (ref 22–32)
Calcium: 9.3 mg/dL (ref 8.9–10.3)
Chloride: 103 mmol/L (ref 98–111)
Creatinine, Ser: 0.75 mg/dL (ref 0.61–1.24)
GFR, Estimated: >60 mL/min (ref >60)
Glucose, Bld: 92 mg/dL (ref 70–99)
Potassium: 3.7 mmol/L (ref 3.5–5.1)
Sodium: 139 mmol/L (ref 135–145)

## 2020-09-25 LAB — CBC
HCT: 40.5 % (ref 39.0–52.0)
Hemoglobin: 13.3 g/dL (ref 13.0–17.0)
MCH: 32.2 pg (ref 26.0–34.0)
MCHC: 32.8 g/dL (ref 30.0–36.0)
MCV: 98.1 fL (ref 80.0–100.0)
Platelets: 251 10*3/uL (ref 150–400)
RBC: 4.13 MIL/uL — ABNORMAL LOW (ref 4.22–5.81)
RDW: 14.6 % (ref 11.5–15.5)
WBC: 6.9 10*3/uL (ref 4.0–10.5)
nRBC: 0 % (ref 0.0–0.2)

## 2020-09-25 LAB — MAGNESIUM: Magnesium: 2.1 mg/dL (ref 1.7–2.4)

## 2020-09-25 LAB — SARS CORONAVIRUS 2 (TAT 6-24 HRS): SARS Coronavirus 2: NEGATIVE

## 2020-09-25 MED ORDER — HEPARIN SOD (PORK) LOCK FLUSH 100 UNIT/ML IV SOLN
500.0000 [IU] | INTRAVENOUS | Status: DC | PRN
  Filled 2020-09-25: qty 5

## 2020-09-25 MED ORDER — ENSURE ENLIVE PO LIQD
237.0000 mL | Freq: Two times a day (BID) | ORAL | Status: DC
  Administered 2020-09-25: 237 mL via ORAL

## 2020-09-25 MED ORDER — THIAMINE HCL 100 MG PO TABS: 100.0000 mg | ORAL_TABLET | Freq: Every day | ORAL | 2 refills | Status: DC

## 2020-09-25 MED ORDER — PROSOURCE PLUS PO LIQD: 30.0000 mL | Freq: Two times a day (BID) | ORAL | Status: DC

## 2020-09-25 MED ORDER — ADULT MULTIVITAMIN W/MINERALS CH: 1.0000 | ORAL_TABLET | Freq: Every day | ORAL | Status: DC

## 2020-09-25 NOTE — TOC Transition Note (Signed)
Transition of Care [facility]) - CM/SW Discharge Note   Patient Details  Name: [name] MRN: [id number] Date of Birth: [birth year]  [facility]) CM/SW Contact:  Natasha Bence, LCSW Phone Number: 09/25/2020, 1:05 PM   Clinical Narrative:    CSW received notification of patient's readiness for discharge and HHPT recommendation. CSW contacted patient to inquire if agreeable to [facility]. Patient agreeable to [facility] services and expressed that he did not have a preference in provider. CSW referred patient to [place] with [facility]. Marjory Lies agreeable to provide services to patient. CSW received order. For 3n1 and BSC. CSW placed referral with [employer]. [employer] agreeable to provide DME to patient and confirmed it will be drop shipped to the home. TOC signing off.     Final next level of care: [place] Barriers to Discharge: Barriers Resolved   Patient Goals and CMS Choice Patient states their goals for this hospitalization and ongoing recovery are:: Return home with [facility] CMS Medicare.gov Compare Post Acute Care list provided to:: Patient Choice offered to / list presented to : Patient  Discharge Placement                    Patient and family notified of of transfer: 09/25/20  Discharge Plan and Services                DME Arranged: Gilford Rile rolling,Bedside commode DME Agency: [employer] Date DME Agency Contacted: 09/25/20 Time DME Agency Contacted: [phone] Representative spoke with at DME Agency: Barbaraann Rondo [place]: PT [place]: [facility] (formerly [employer]) Date [place]: 09/25/20 Time [place]: [phone] Representative spoke with at [place]: [place] ([place]) Interventions     Readmission Risk Interventions No flowsheet data found.

## 2020-09-25 NOTE — Progress Notes (Signed)
Initial Nutrition Assessment  RD working remotely.  DOCUMENTATION CODES:   Not applicable  INTERVENTION:  - will order Ensure Enlive BID, each supplement provides 350 kcal and 20 grams of protein. - will order Magic Cup BID with meals, each supplement provides 290 kcal and 9 grams of protein. - will order 30 ml Prosource Plus BID, each supplement provides 100 kcal and 15 grams protein.  - complete NFPE when feasible. - recommend checking serum Mg--will communicate with MD.   NUTRITION DIAGNOSIS:   Increased nutrient needs related to catabolic illness,cancer and cancer related treatments,acute illness as evidenced by estimated needs.  GOAL:   Patient will meet greater than or equal to 90% of their needs  MONITOR:   PO intake,Supplement acceptance,Labs,Weight trends  REASON FOR ASSESSMENT:   Consult Assessment of nutrition requirement/status  ASSESSMENT:   75 y.o. 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 He presented to the ED with complaint of chronic generalized weakness that has been ongoing since completing XRT and chemo in 10/2018. During follow-up visit in 07/2020 there was no obvious concern for recurrence. His brother is his caretaker. Patient fell while ambulating to the bathroom on the date of admission.  RD working off site. Unable to reach patient by phone. He has not been seen by a [place] RD at any time in the past.   He ate 75% of breakfast this AM (381 kcal and 17 grams protein). No other intakes documented in the chart.   MST score of 0. Weight yesterday was documented as both 176 lb and 167 lb. PTA, the most recently documented weight was on 07/23/20 when he weighed 178 lb and on 06/29/20 when he weighed 177 lb.   He is currently on airborne and contact precautions but no mention in the chart about COVID testing results.    Labs reviewed. Medications reviewed; 2000 units  cholecalciferol/day, 200 mcg oral synthroid/day, 500 mg metformin BID, 1 tablet multivitamin with minerals/day, 100 mg thiamine/day.    NUTRITION - FOCUSED PHYSICAL EXAM:  unable to complete at this time.   Diet Order:   Diet Order            Diet heart healthy/carb modified Room service appropriate? Yes; Fluid consistency: Thin  Diet effective now                 EDUCATION NEEDS:   Not appropriate for education at this time  Skin:  Skin Assessment: Reviewed RN Assessment  Last BM:  PTA/unknown  Height:   Ht Readings from Last 1 Encounters:  09/24/20 5\' 11"  (1.803 m)    Weight:   Wt Readings from Last 1 Encounters:  09/24/20 79.8 kg     Estimated Nutritional Needs:  Kcal:  2150-2400 kcal Protein:  110-125 grams Fluid:  >/= 2.5 L/day      Jarome Matin, MS, RD, LDN, CNSC Inpatient Clinical Dietitian RD pager # available in [facility]  After hours/weekend pager # available in [facility]

## 2020-09-25 NOTE — Evaluation (Signed)
Physical Therapy Evaluation Patient Details Name: [name] MRN: [id number] DOB: [birth year] Today's Date: 09/25/2020   History of Present Illness  [name] is a 74 y.o. male with medical history significant of COPD, type 2 diabetes mellitus, hypertension, dyslipidemia, and small cell lung cancer.  Patient reports chronic generalized weakness that has been ongoing since he completed his radiation therapy and chemotherapy for lung cancer in 2020.  He fell walking into the bathroom and was brought to the ER.  He is now being referred to skilled Physcial therapy.  Clinical Impression  Pt fatigues easily; supervision assist needed when pt is ambulating.      Follow Up Recommendations Home health PT;Supervision for mobility/OOB    Equipment Recommendations  Rolling walker with 5" wheels;3in1 (PT)    Recommendations for Other Services   OT    Precautions / Restrictions Precautions Precautions: Fall      Mobility  Bed Mobility Overal bed mobility: Modified Independent                  Transfers Overall transfer level: Modified independent Equipment used: Rolling walker (2 wheeled)                Ambulation/Gait Ambulation/Gait assistance: Supervision Gait Distance (Feet): 50 Feet Assistive device: Rolling walker (2 wheeled) Gait Pattern/deviations: Decreased step length - left;Decreased stance time - right   Gait velocity interpretation: <1.8 ft/sec, indicate of risk for recurrent falls             Pertinent Vitals/Pain Pain Assessment:  (pt has chronic pain no new pain)    Home Living Family/patient expects to be discharged to:: Private residence Living Arrangements: Other relatives Available Help at Discharge: Family Type of Home: House       Home Layout: One level Home Equipment: None      Prior Function Level of Independence: Independent                  Extremity/Trunk Assessment        Lower Extremity Assessment Lower  Extremity Assessment: Generalized weakness       Communication   Communication: No difficulties  Cognition Arousal/Alertness: Awake/alert   Overall Cognitive Status: Within Functional Limits for tasks assessed                                           Exercises General Exercises - Lower Extremity Ankle Circles/Pumps: Both;10 reps Quad Sets: Both;10 reps Long Arc Quad: Both;10 reps Heel Slides: Both;10 reps Hip ABduction/ADduction: Both;10 reps Mini-Sqauts: 5 reps (sit to  stands)   Assessment/Plan    PT Assessment Patient needs continued PT services  PT Problem List Decreased strength;Decreased activity tolerance;Decreased balance       PT Treatment Interventions Gait training;Therapeutic exercise;Balance training;Functional mobility training;Therapeutic activities    PT Goals (Current goals can be found in the Care Plan section)       Frequency Min 3X/week   Barriers to discharge           AM-PAC PT "6 Clicks" Mobility  Outcome Measure Help needed turning from your back to your side while in a flat bed without using bedrails?: A Little Help needed moving from lying on your back to sitting on the side of a flat bed without using bedrails?: A Little Help needed moving to and from a bed to a chair (including a  wheelchair)?: A Little Help needed standing up from a chair using your arms (e.g., wheelchair or bedside chair)?: A Little Help needed to walk in hospital room?: A Little Help needed climbing 3-5 steps with a railing? : A Lot 6 Click Score: 17    End of Session Equipment Utilized During Treatment: Gait belt Activity Tolerance: Patient tolerated treatment well Patient left: in chair;with call bell/phone within reach Nurse Communication: Mobility status PT Visit Diagnosis: Unsteadiness on feet (R26.81);Repeated falls (R29.6);Muscle weakness (generalized) (M62.81)    Time: 8676-7209 PT Time Calculation (min) (ACUTE ONLY): 30  min   Charges:   PT Evaluation $PT Eval Low Complexity: 1 Low PT Treatments $Therapeutic Exercise: 8-22 mins        Rayetta Humphrey, PT CLT [phone] 09/25/2020, 12:22 PM

## 2020-09-25 NOTE — Discharge Instructions (Signed)
Deconditioning Deconditioning refers to the changes in the body that occur during a period of inactivity. The changes happen in the heart, lungs, and muscles. They make you feel tired and weak (fatigued) and decrease your ability to be active. The three stages of deconditioning include:  Mild deconditioning. This is a change in your ability to do your usual exercise activities, such as running, biking, or swimming.  Moderate deconditioning. This is a change in your ability to do normal everyday activities, such as walking, shopping for groceries, and doing chores.  Severe deconditioning. In this stage, you may not be able to do minimal activity or usual self-care. What are the causes? Deconditioning can occur after only a few days of inactivity. The longer the period of inactivity, the more severe the deconditioning will be, and the longer it will take to return to your previous level of functioning. Deconditioning is often caused by inactivity due to:  Illnesses, such as cancer, stroke, heart attack, fibromyalgia, and chronic fatigue syndrome.  Injuries, especially back injuries, broken bones, and injuries to soft tissues, such as ligaments and tendons.  A long stay in the hospital.  Pregnancy, especially if long periods of bed rest are needed. What increases the risk? The following factors may make you more likely to develop this condition:  Staying in the hospital or being on bed rest.  Obesity.  Poor nutrition.  Being an older adult.  Having an injury or illness that affects your movement and activity. What are the signs or symptoms? Symptoms of this condition include:  Weakness and tiredness.  Shortness of breath with minor physical effort (exertion).  A heartbeat that is faster than normal. You may not notice this without taking your pulse.  Pain or discomfort with activity.  Decreased strength, endurance, and balance.  Difficulty doing your usual forms of  exercise.  Difficulty doing activities of daily living, such as grocery shopping or chores. You may also have problems walking around the house and doing basic self-care, such as getting to the bathroom, preparing meals, or doing laundry. How is this diagnosed? This condition is diagnosed based on your medical history and a physical exam. During the physical exam, your health care provider will check for signs of deconditioning, such as:  Decreased size of muscles.  Decreased strength.  Trouble with balance.  Shortness of breath or a heart rate that is faster than normal after minor exertion. How is this treated? Treatment for this condition involves an exercise program in which activity is increased slowly. Your health care provider will tell you which exercises are right for you. The exercise program will likely include:  Aerobic exercise. This type of exercise helps improve the functioning of the heart, lungs, and muscles.  Strength training. This type of exercise helps increase muscle size and strength. Both of these types of exercise will improve your endurance. You may be referred to a physical therapist who can create a safe strengthening program for you to follow. Follow these instructions at home: Eating and drinking  Eat a healthy, well-balanced diet. This includes: ? Proteins, such as lean meats and fish, to build muscles. ? Fresh fruits and vegetables. ? Carbohydrates, such as whole grains, to boost energy.  Drink enough fluid to keep your urine pale yellow.   Activity  Follow the exercise program that is recommended by your health care provider or physical therapist.  Do not increase your exercise any faster than directed.   General instructions  Take over-the-counter and prescription  medicines only as told by your health care provider.  Do not use any products that contain nicotine or tobacco, such as cigarettes, e-cigarettes, and chewing tobacco. If you need help  quitting, ask your health care provider.  Keep all follow-up visits as told by your health care provider. This is important. Contact a health care provider if:  You are not able to do the recommended exercise program.  You are becoming more and more tired and weak.  You become light-headed when rising to a sitting or standing position.  Your level of endurance decreases after it has improved. Get help right away if you:  Have chest pain.  Are very short of breath.  Have any episodes of fainting. Summary  Deconditioning refers to the changes in the body that occur during a period of inactivity.  Deconditioning happens in the heart, lungs, and muscles. The changes make you feel tired and weak and decrease your ability to be active.  Treatment for deconditioning involves an exercise program in which activity is increased slowly. This information is not intended to replace advice given to you by your health care provider. Make sure you discuss any questions you have with your health care provider. Document Revised: 12/19/2018 Document Reviewed: 12/19/2018 Elsevier Patient Education  2021 [place].   Weakness Weakness is a lack of strength. You may feel weak all over your body (generalized), or you may feel weak in one specific part of your body (focal). Common causes of weakness include:  Infection and immune system disorders.  Physical exhaustion.  Internal bleeding or other blood loss that results in a lack of red blood cells (anemia).  Dehydration.  An imbalance in mineral (electrolyte) levels, such as potassium.  Heart disease, circulation problems, or stroke. Other causes include:  Some medicines or cancer treatment.  Stress, anxiety, or depression.  Nervous system disorders.  Thyroid disorders.  Loss of muscle strength because of age or inactivity.  Poor sleep quality or sleep disorders. The cause of your weakness may not be known. Some causes of  weakness can be serious, so it is important to see your health care provider. Follow these instructions at home: Activity  Rest as needed.  Try to get enough sleep. Most adults need 7-8 hours of quality sleep each night. Talk to your health care provider about how much sleep you need each night.  Do exercises, such as arm curls and leg raises, for 30 minutes at least 2 days a week or as told by your health care provider. This helps build muscle strength.  Consider working with a physical therapist or trainer who can develop an exercise plan to help you gain muscle strength. General instructions  Take over-the-counter and prescription medicines only as told by your health care provider.  Eat a healthy, well-balanced diet. This includes: ? Proteins to build muscles, such as lean meats and fish. ? Fresh fruits and vegetables. ? Carbohydrates to boost energy, such as whole grains.  Drink enough fluid to keep your urine pale yellow.  Keep all follow-up visits as told by your health care provider. This is important.   Contact a health care provider if your weakness:  Does not improve or gets worse.  Affects your ability to think clearly.  Affects your ability to do your normal daily activities. Get help right away if you:  Develop sudden weakness, especially on one side of your face or body.  Have chest pain.  Have trouble breathing or shortness of breath.  Have problems with your vision.  Have trouble talking or swallowing.  Have trouble standing or walking.  Are light-headed or lose consciousness. Summary  Weakness is a lack of strength. You may feel weak all over your body or just in one specific part of your body.  Weakness can be caused by a variety of things. In some cases, the cause may be unknown.  Rest as needed, and try to get enough sleep. Most adults need 7-8 hours of quality sleep each night.  Eat a healthy, well-balanced diet. This information is not  intended to replace advice given to you by your health care provider. Make sure you discuss any questions you have with your health care provider. Document Revised: 02/27/2018 Document Reviewed: 02/27/2018 Elsevier Patient Education  [place].   IMPORTANT INFORMATION: PAY CLOSE ATTENTION   PHYSICIAN DISCHARGE INSTRUCTIONS  Follow with Primary care provider  Lucia Gaskins, MD  and other consultants as instructed by your Hospitalist Physician  [place] IF SYMPTOMS COME BACK, WORSEN OR NEW PROBLEM DEVELOPS   Please note: You were cared for by a hospitalist during your hospital stay. Every effort will be made to forward records to your primary care provider.  You can request that your primary care provider send for your hospital records if they have not received them.  Once you are discharged, your primary care physician will handle any further medical issues. Please note that NO REFILLS for any discharge medications will be authorized once you are discharged, as it is imperative that you return to your primary care physician (or establish a relationship with a primary care physician if you do not have one) for your post hospital discharge needs so that they can reassess your need for medications and monitor your lab values.  Please get a complete blood count and chemistry panel checked by your Primary MD at your next visit, and again as instructed by your Primary MD.  Get Medicines reviewed and adjusted: Please take all your medications with you for your next visit with your Primary MD  Laboratory/radiological data: Please request your Primary MD to go over all hospital tests and procedure/radiological results at the follow up, please ask your primary care provider to get all Hospital records sent to his/her office.  In some cases, they will be blood work, cultures and biopsy results pending at the time of your discharge. Please request that  your primary care provider follow up on these results.  If you are diabetic, please bring your blood sugar readings with you to your follow up appointment with primary care.    Please call and make your follow up appointments as soon as possible.    Also Note the following: If you experience worsening of your admission symptoms, develop shortness of breath, life threatening emergency, suicidal or homicidal thoughts you must seek medical attention immediately by calling 911 or calling your MD immediately  if symptoms less severe.  You must read complete instructions/literature along with all the possible adverse reactions/side effects for all the Medicines you take and that have been prescribed to you. Take any new Medicines after you have completely understood and accpet all the possible adverse reactions/side effects.   Do not drive when taking Pain medications or sleeping medications (Benzodiazepines)  Do not take more than prescribed Pain, Sleep and Anxiety Medications. It is not advisable to combine anxiety,sleep and pain medications without talking with your primary care practitioner  Special Instructions: If you  have smoked or chewed Tobacco  in the last 2 yrs please stop smoking, stop any regular Alcohol  and or any Recreational drug use.  Wear Seat belts while driving.  Do not drive if taking any narcotic, mind altering or controlled substances or recreational drugs or alcohol.

## 2020-09-25 NOTE — Discharge Summary (Signed)
Physician Discharge Summary  HISASHI AMADON HAL:[id number] DOB: [birth year] DOA: 09/24/2020  PCP: Lucia Gaskins, MD  Admit date: 09/24/2020 Discharge date: 09/25/2020  Admitted From:  Home  Disposition: Home with [facility]   Recommendations for Outpatient Follow-up:  1. Follow up with PCP in 1 weeks 2. Outpatient follow up with cardiology 3. Outpatient palliative medicine follow up.   Home Health:  PT  Discharge Condition: STABLE   CODE STATUS: DNR   Diet: resume prior home diet  Brief Hospitalization Summary: Please see all hospital notes, images, labs for full details of the hospitalization. ADMISSION HPI: [name] is a 75 y.o. male with medical history significant of COPD, type 2 diabetes mellitus, hypertension, dyslipidemia, and small cell lung cancer. Patient reports chronic generalized weakness that has been ongoing since he completed his radiation therapy and chemotherapy for lung cancer in 2020.  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 He completed therapy 10/2018, immunotherapy was held 02/2019 due to colitis Follow-up visit 07/2020, no obvious concerns for recurrence.   He lives at home with his brother who takes care of him.  Lately he has noticed worsening lower extremity weakness.  Today while walking from his bedroom to the bathroom he fell.  He denies any head trauma loss of consciousness. He was unable to stand back on his feet, when his brother came back home from work saw him in the floor and called EMS.  His weakness seemed to be predominantly at his lower extremities, worse with ambulation, no improving or worsening factors, no associated pain.  He was brought into the hospital for further evaluation.  ED Course: Patient had extensive work-up, so far negative, patient was unable to ambulate without assistance.  It was considered unsafe patient to return home, high risk of falls.  He was  referred for admission for evaluation.  Hospital Course Pt was admitted for observation.  He was monitored and remained stable and has been able to ambulate several times, at times doing push ups in the room.  He had brief episode of 1st degree AVB on telemetry but now back in sinus rhythm.  He is being referred to cardiology to follow up.  He is being referred for outpatient palliative care.  He will be ordered for home health PT and close outpatient follow up.  He is stable to discharge home today.      Discharge Diagnoses:  Principal Problem:   Ambulatory dysfunction Active Problems:   Small cell carcinoma of lung, right (HCC)   Brain metastasis (HCC)   Chronic obstructive pulmonary disease (HCC)   Type 2 diabetes mellitus without complication [facility])   Discharge Instructions: Discharge Instructions    Amb Referral to Palliative Care   Complete by: As directed    Amb ref to Medical Nutrition Therapy-MNT   Complete by: As directed    Choose the type of MNT and number of hours: Initial MNT:  3 hours   Ambulatory referral to Cardiology   Complete by: As directed      Allergies as of 09/25/2020   No Known Allergies     Medication List    STOP taking these medications   docusate sodium 100 MG capsule Commonly known as: COLACE   IMFINZI IV   telmisartan 40 MG tablet Commonly known as: MICARDIS     TAKE these medications   albuterol 108 (90 Base) MCG/ACT inhaler Commonly known as: VENTOLIN HFA Inhale 2 puffs  into the lungs every 6 (six) hours as needed for wheezing or shortness of breath.   aspirin 81 MG tablet Take 81 mg by mouth daily.   Cholecalciferol 50 MCG (2000 UT) Tbdp Take 1 tablet by mouth daily.   levothyroxine 200 MCG tablet Commonly known as: SYNTHROID Take 200 mcg by mouth daily.   metFORMIN 500 MG tablet Commonly known as: GLUCOPHAGE Take 500 mg by mouth 2 (two) times daily with a meal.   multivitamin with minerals Tabs tablet Take 1 tablet by  mouth daily. Start taking on: September 26, 2020   rosuvastatin 10 MG tablet Commonly known as: CRESTOR Take 10 mg by mouth daily.   thiamine 100 MG tablet Take 1 tablet (100 mg total) by mouth daily. Start taking on: September 26, 2020   verapamil 240 MG (CO) 24 hr tablet Commonly known as: COVERA HS Take 120 mg by mouth at bedtime.       Follow-up Information    Lucia Gaskins, MD. Schedule an appointment as soon as possible for a visit in 1 week(s).   Specialty: Internal Medicine Contact information: [place] [address] [phone]              No Known Allergies Allergies as of 09/25/2020   No Known Allergies     Medication List    STOP taking these medications   docusate sodium 100 MG capsule Commonly known as: COLACE   IMFINZI IV   telmisartan 40 MG tablet Commonly known as: MICARDIS     TAKE these medications   albuterol 108 (90 Base) MCG/ACT inhaler Commonly known as: VENTOLIN HFA Inhale 2 puffs into the lungs every 6 (six) hours as needed for wheezing or shortness of breath.   aspirin 81 MG tablet Take 81 mg by mouth daily.   Cholecalciferol 50 MCG (2000 UT) Tbdp Take 1 tablet by mouth daily.   levothyroxine 200 MCG tablet Commonly known as: SYNTHROID Take 200 mcg by mouth daily.   metFORMIN 500 MG tablet Commonly known as: GLUCOPHAGE Take 500 mg by mouth 2 (two) times daily with a meal.   multivitamin with minerals Tabs tablet Take 1 tablet by mouth daily. Start taking on: September 26, 2020   rosuvastatin 10 MG tablet Commonly known as: CRESTOR Take 10 mg by mouth daily.   thiamine 100 MG tablet Take 1 tablet (100 mg total) by mouth daily. Start taking on: September 26, 2020   verapamil 240 MG (CO) 24 hr tablet Commonly known as: COVERA HS Take 120 mg by mouth at bedtime.      Procedures/Studies: CT Head Wo Contrast  Result Date: 09/24/2020 CLINICAL DATA:  Dizziness. EXAM: CT HEAD WITHOUT CONTRAST  TECHNIQUE: Contiguous axial images were obtained from the base of the skull through the vertex without intravenous contrast. COMPARISON:  August 21, 2018. FINDINGS: Brain: Mild chronic ischemic white matter disease is noted. Minimal diffuse cortical atrophy is noted. No mass effect or midline shift is noted. Ventricular size is within normal limits. There is no evidence of mass lesion, hemorrhage or acute infarction. Vascular: No hyperdense vessel or unexpected calcification. Skull: Normal. Negative for fracture or focal lesion. Sinuses/Orbits: No acute finding. Other: None. IMPRESSION: Mild chronic ischemic white matter disease. Minimal diffuse cortical atrophy. No acute intracranial abnormality seen. Electronically Signed   By: Marijo Conception M.D.   On: 09/24/2020 09:59   DG Chest Portable 1 View  Result Date: 09/24/2020 CLINICAL DATA:  Cough EXAM: PORTABLE CHEST 1  VIEW COMPARISON:  09/11/2018 FINDINGS: Perihilar radiation fibrosis. Mildly increased density over the right upper chest likely from pleural based scarring by December 2021 CT. Normal heart size and stable aortic tortuosity. Left-sided port in place. There is no edema, consolidation, effusion, or pneumothorax. IMPRESSION: No acute finding when compared to prior. Electronically Signed   By: Monte Fantasia M.D.   On: 09/24/2020 10:09      Subjective: Pt without complaints.  He has been ambulating since arrival.  He is agreeable to home health PT and going home today.   Discharge Exam: Vitals:   09/24/20 2200 09/25/20 0600  BP: (!) 142/87 105/77  Pulse: 64 (!) 57  Resp: 20 20  Temp: 98.3 F (36.8 C) 98.2 F (36.8 C)  SpO2: 95% 96%   Vitals:   09/24/20 1742 09/24/20 1803 09/24/20 2200 09/25/20 0600  BP:  (!) 130/94 (!) 142/87 105/77  Pulse:  68 64 (!) 57  Resp:  19 20 20   Temp:  98.6 F (37 C) 98.3 F (36.8 C) 98.2 F (36.8 C)  TempSrc:  Oral    SpO2:   95% 96%  Weight: 79.8 kg     Height: 5\' 11"  (1.803 m)       General: Pt is alert, awake, not in acute distress Cardiovascular: normal S1/S2 +, no rubs, no gallops Respiratory: CTA bilaterally, no wheezing, no rhonchi Abdominal: Soft, NT, ND, bowel sounds + Extremities: no edema, no cyanosis   The results of significant diagnostics from this hospitalization (including imaging, microbiology, ancillary and laboratory) are listed below for reference.     Microbiology: Recent Results (from the past 240 hour(s))  SARS CORONAVIRUS 2 (TAT 6-24 HRS) Nasopharyngeal Nasopharyngeal Swab     Status: None   Collection Time: 09/24/20  2:51 PM   Specimen: Nasopharyngeal Swab  Result Value Ref Range Status   SARS Coronavirus 2 NEGATIVE NEGATIVE Final    Comment: (NOTE) SARS-CoV-2 target nucleic acids are NOT DETECTED.  The SARS-CoV-2 RNA is generally detectable in upper and lower respiratory specimens during the acute phase of infection. Negative results do not preclude SARS-CoV-2 infection, do not rule out co-infections with other pathogens, and should not be used as the sole basis for treatment or other patient management decisions. Negative results must be combined with clinical observations, patient history, and epidemiological information. The expected result is Negative.  Fact Sheet for Patients: SugarRoll.be  Fact Sheet for Healthcare Providers: https://www.woods-mathews.com/  This test is not yet approved or cleared by the [place] FDA and  has been authorized for detection and/or diagnosis of SARS-CoV-2 by FDA under an Emergency Use Authorization (EUA). This EUA will remain  in effect (meaning this test can be used) for the duration of the COVID-19 declaration under Se ction 564(b)(1) of the Act, 21 U.S.C. section 360bbb-3(b)(1), unless the authorization is terminated or revoked sooner.  Performed at [facility], [place] [address], [place], [place] [address]      Labs: BNP (last 3  results) No results for input(s): BNP in the last 8760 hours. Basic Metabolic Panel: Recent Labs  Lab 09/24/20 0935 09/24/20 1812 09/25/20 0508  NA 139  --  139  K 3.5  --  3.7  CL 107  --  103  CO2 24  --  26  GLUCOSE 71  --  92  BUN 16  --  21  CREATININE 0.77 0.84 0.75  CALCIUM 9.0  --  9.3  MG  --   --  2.1   Liver Function Tests: Recent Labs  Lab 09/24/20 0935  AST 16  ALT 10  ALKPHOS 56  BILITOT 1.4*  PROT 6.5  ALBUMIN 4.1   No results for input(s): LIPASE, AMYLASE in the last 168 hours. No results for input(s): AMMONIA in the last 168 hours. CBC: Recent Labs  Lab 09/24/20 0935 09/24/20 1812 09/25/20 0508  WBC 8.1 8.9 6.9  NEUTROABS 6.0  --   --   HGB 13.5 14.4 13.3  HCT 41.1 44.4 40.5  MCV 99.5 100.0 98.1  PLT 257 255 251   Cardiac Enzymes: No results for input(s): CKTOTAL, CKMB, CKMBINDEX, TROPONINI in the last 168 hours. BNP: Invalid input(s): POCBNP CBG: No results for input(s): GLUCAP in the last 168 hours. D-Dimer No results for input(s): DDIMER in the last 72 hours. Hgb A1c No results for input(s): HGBA1C in the last 72 hours. Lipid Profile No results for input(s): CHOL, HDL, LDLCALC, TRIG, CHOLHDL, LDLDIRECT in the last 72 hours. Thyroid function studies No results for input(s): TSH, T4TOTAL, T3FREE, THYROIDAB in the last 72 hours.  Invalid input(s): FREET3 Anemia work up No results for input(s): VITAMINB12, FOLATE, FERRITIN, TIBC, IRON, RETICCTPCT in the last 72 hours. Urinalysis    Component Value Date/Time   COLORURINE YELLOW 09/24/2020 [place] 09/24/2020 1349   LABSPEC 1.017 09/24/2020 1349   PHURINE 5.0 09/24/2020 1349   GLUCOSEU NEGATIVE 09/24/2020 1349   HGBUR NEGATIVE 09/24/2020 1349   BILIRUBINUR NEGATIVE 09/24/2020 1349   KETONESUR 20 (A) 09/24/2020 1349   PROTEINUR NEGATIVE 09/24/2020 1349   UROBILINOGEN 0.2 12/22/2010 1203   NITRITE NEGATIVE 09/24/2020 1349   LEUKOCYTESUR NEGATIVE 09/24/2020 1349    Sepsis Labs Invalid input(s): PROCALCITONIN,  WBC,  LACTICIDVEN Microbiology Recent Results (from the past 240 hour(s))  SARS CORONAVIRUS 2 (TAT 6-24 HRS) Nasopharyngeal Nasopharyngeal Swab     Status: None   Collection Time: 09/24/20  2:51 PM   Specimen: Nasopharyngeal Swab  Result Value Ref Range Status   SARS Coronavirus 2 NEGATIVE NEGATIVE Final    Comment: (NOTE) SARS-CoV-2 target nucleic acids are NOT DETECTED.  The SARS-CoV-2 RNA is generally detectable in upper and lower respiratory specimens during the acute phase of infection. Negative results do not preclude SARS-CoV-2 infection, do not rule out co-infections with other pathogens, and should not be used as the sole basis for treatment or other patient management decisions. Negative results must be combined with clinical observations, patient history, and epidemiological information. The expected result is Negative.  Fact Sheet for Patients: SugarRoll.be  Fact Sheet for Healthcare Providers: https://www.woods-mathews.com/  This test is not yet approved or cleared by the [place] FDA and  has been authorized for detection and/or diagnosis of SARS-CoV-2 by FDA under an Emergency Use Authorization (EUA). This EUA will remain  in effect (meaning this test can be used) for the duration of the COVID-19 declaration under Se ction 564(b)(1) of the Act, 21 U.S.C. section 360bbb-3(b)(1), unless the authorization is terminated or revoked sooner.  Performed at [facility], [place] [address], [place], [place] [address]    Time coordinating discharge:   SIGNED:  Irwin Brakeman, MD  [facility] 09/25/2020, 11:18 AM How to contact the [facility] Attending or Consulting provider [place] or covering provider during after hours [place], for this patient?  1. Check the care team in [facility] and look for a) attending/consulting TRH provider listed and b) the [facility] team listed 2. Log  into [email or web address] and  use 's universal password to access. If you do not have the password, please contact the hospital operator. 3. Locate the [facility] provider you are looking for under [facility] and page to a number that you can be directly reached. 4. If you still have difficulty reaching the provider, please page the [facility] (Director on Call) for the Hospitalists listed on amion for assistance.

## 2020-09-25 NOTE — TOC Transition Note (Signed)
Transition of Care [facility]) - CM/SW Discharge Note   Patient Details  Name: LIJAH BOURQUE MRN: [id number] Date of Birth: 1945/12/04  Transition of Care [facility]) CM/SW Contact:  Natasha Bence, LCSW Phone Number: 09/25/2020, 12:56 PM   Clinical Narrative:    CSW received notification of patient's readiness for discharge and HHPT recommendation. CSW contacted patient to inquire if agreeable to [facility]. Patient agreeable to [facility] services and expressed that he did not have a preference in provider. CSW referred patient to [place] with [facility]. Marjory Lies agreeable to provide services to patient. TOC signing off.    Final next level of care: [place] Barriers to Discharge: Barriers Resolved   Patient Goals and CMS Choice Patient states their goals for this hospitalization and ongoing recovery are:: Return home with [facility] CMS Medicare.gov Compare Post Acute Care list provided to:: Patient Choice offered to / list presented to : Patient  Discharge Placement                    Patient and family notified of of transfer: 09/25/20  Discharge Plan and Services                DME Arranged: N/A DME Agency: NA       HH Arranged: PT [place] Agency: [facility] [employer] [facility] [employer] Date [place]: 09/25/20 Time [place]: 1255 Representative spoke with at [place]: [place] ([place]) Interventions     Readmission Risk Interventions No flowsheet data found.

## 2020-09-27 ENCOUNTER — Telehealth: Payer: Self-pay | Admitting: Internal Medicine

## 2020-09-27 NOTE — Telephone Encounter (Signed)
Called patient regarding March upcoming appointments, left voicemail.

## 2020-10-12 ENCOUNTER — Telehealth: Payer: Self-pay | Admitting: Nurse Practitioner

## 2020-10-12 NOTE — Telephone Encounter (Signed)
Called patient's sister, Armstead Peaks, to offer to schedule a Palliative Consult with patient, no answer - left message with reason for call along with my name and call back number.

## 2020-10-13 ENCOUNTER — Telehealth: Payer: Self-pay | Admitting: Nurse Practitioner

## 2020-10-13 NOTE — Telephone Encounter (Signed)
Spoke with patient's sister, Armstead Peaks, and she had talked with patient about Palliative referral/services and she said he was in agreement with scheduling a visit with [place].  I have scheduled an In-home Consult for 11/11/20 @ 10:30 AM.  (Appointment was scheduled out due to patient was receiving home health services, PT/OT and didn't want to bombard patient with too many people coming into the home).

## 2020-10-22 ENCOUNTER — Inpatient Hospital Stay: Admission: RE | Admit: 2020-10-22 | Payer: TRICARE For Life (TFL) | Source: Ambulatory Visit

## 2020-10-25 ENCOUNTER — Telehealth: Payer: Self-pay | Admitting: Internal Medicine

## 2020-10-25 ENCOUNTER — Inpatient Hospital Stay: Payer: TRICARE For Life (TFL)

## 2020-10-25 NOTE — Telephone Encounter (Signed)
Moved 3/24 appt to 4/4 per sch msg. Called and left msg. Mailed printout

## 2020-10-27 ENCOUNTER — Inpatient Hospital Stay (HOSPITAL_COMMUNITY): Payer: Medicare Other | Attending: Hematology | Admitting: Hematology

## 2020-10-27 ENCOUNTER — Telehealth: Payer: Self-pay | Admitting: Nurse Practitioner

## 2020-10-27 ENCOUNTER — Other Ambulatory Visit: Payer: Self-pay

## 2020-10-27 VITALS — BP 153/90 | HR 81 | Temp 97.9°F | Resp 18 | Wt 176.8 lb

## 2020-10-27 DIAGNOSIS — E785 Hyperlipidemia, unspecified: Secondary | ICD-10-CM | POA: Insufficient documentation

## 2020-10-27 DIAGNOSIS — J449 Chronic obstructive pulmonary disease, unspecified: Secondary | ICD-10-CM | POA: Diagnosis not present

## 2020-10-27 DIAGNOSIS — E119 Type 2 diabetes mellitus without complications: Secondary | ICD-10-CM | POA: Insufficient documentation

## 2020-10-27 DIAGNOSIS — Z79899 Other long term (current) drug therapy: Secondary | ICD-10-CM | POA: Insufficient documentation

## 2020-10-27 DIAGNOSIS — Z9221 Personal history of antineoplastic chemotherapy: Secondary | ICD-10-CM | POA: Insufficient documentation

## 2020-10-27 DIAGNOSIS — I10 Essential (primary) hypertension: Secondary | ICD-10-CM | POA: Insufficient documentation

## 2020-10-27 DIAGNOSIS — Z7984 Long term (current) use of oral hypoglycemic drugs: Secondary | ICD-10-CM | POA: Diagnosis not present

## 2020-10-27 DIAGNOSIS — Z87891 Personal history of nicotine dependence: Secondary | ICD-10-CM | POA: Insufficient documentation

## 2020-10-27 DIAGNOSIS — C3491 Malignant neoplasm of unspecified part of right bronchus or lung: Secondary | ICD-10-CM | POA: Diagnosis not present

## 2020-10-27 DIAGNOSIS — Z85118 Personal history of other malignant neoplasm of bronchus and lung: Secondary | ICD-10-CM | POA: Diagnosis not present

## 2020-10-27 NOTE — Patient Instructions (Signed)
[place] at [facility] Discharge Instructions  You were seen today by Dr. Delton Coombes. He went over your recent results. Keep your appointment to have your brain MRI on 03/31. You will be scheduled to have a CT scan of your chest and abdomen before your next visit. Dr. Delton Coombes will see you back in 2 months for labs and follow up.   Thank you for choosing [place] at [facility] to provide your oncology and hematology care.  To afford each patient quality time with our provider, please arrive at least 15 minutes before your scheduled appointment time.   If you have a lab appointment with the [place] please come in thru the Main Entrance and check in at the main information desk  You need to re-schedule your appointment should you arrive 10 or more minutes late.  We strive to give you quality time with our providers, and arriving late affects you and other patients whose appointments are after yours.  Also, if you no show three or more times for appointments you may be dismissed from the clinic at the providers discretion.     Again, thank you for choosing [facility].  Our hope is that these requests will decrease the amount of time that you wait before being seen by our physicians.       _____________________________________________________________  Should you have questions after your visit to [facility], please contact our office at [phone] between the hours of 8:00 a.m. and 4:30 p.m.  Voicemails left after 4:00 p.m. will not be returned until the following business day.  For prescription refill requests, have your pharmacy contact our office and allow 72 hours.    Cancer Center Support Programs:   > Cancer Support Group  2nd Tuesday of the month 1pm-2pm, Journey Room

## 2020-10-27 NOTE — Progress Notes (Signed)
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 Carboplatin, VP-16 and durvalumab x 4 cycles from 08/23/2018 to 10/28/2018. 2. Durvalumab from 11/20/2018 to 02/13/2019.  NGS Results: Not done  CURRENT THERAPY: Surveillance  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[place])  08/22/2018 Initial Diagnosis   Small cell carcinoma of lung, right ([place])   08/23/2018 - 11/01/2018 Chemotherapy   The patient had palonosetron (ALOXI) injection 0.25 mg, 0.25 mg, Intravenous,  Once, 4 of 4 cycles Administration: 0.25 mg (08/23/2018), 0.25 mg (09/16/2018), 0.25 mg (10/07/2018), 0.25 mg (10/28/2018) pegfilgrastim-cbqv (UDENYCA) injection 6 mg, 6 mg, Subcutaneous, Once, 4 of 4 cycles Administration: 6 mg (08/27/2018), 6 mg (09/20/2018), 6 mg (10/11/2018), 6 mg (11/01/2018) CARBOplatin (PARAPLATIN) 560 mg in sodium chloride 0.9 % 250 mL chemo infusion, 560 mg (100 % of original dose 564 mg), Intravenous,  Once, 4 of 4 cycles Dose modification:   (original dose 564 mg, Cycle 1),   (original dose 523.5 mg, Cycle 3),   (original dose 523.5 mg, Cycle 4) Administration: 560 mg (08/23/2018), 560 mg (09/16/2018), 520 mg (10/07/2018), 510 mg (10/28/2018) etoposide (VEPESID) 220 mg in sodium chloride 0.9 % 1,000 mL chemo infusion, 100 mg/m2 = 220 mg, Intravenous,  Once, 4 of 4 cycles Administration: 220 mg (08/23/2018), 200 mg (09/16/2018), 200 mg (09/17/2018), 200 mg (09/18/2018), 200 mg (10/07/2018), 200 mg (10/08/2018), 200 mg (10/09/2018), 200 mg (10/28/2018), 200 mg (10/29/2018), 200 mg (10/30/2018) fosaprepitant (EMEND) 150 mg, dexamethasone (DECADRON) 12 mg in sodium chloride 0.9 % 145 mL IVPB, ,  Intravenous,  Once, 4 of 4 cycles Administration:  (08/23/2018),  (09/16/2018),  (10/07/2018),  (10/28/2018) durvalumab (IMFINZI) 1,500 mg in sodium chloride 0.9 % 100 mL chemo infusion, 1,500 mg (100 % of original dose 1,500 mg), Intravenous,  Once, 3 of 3 cycles Dose modification: 1,500 mg (original dose 1,500 mg, Cycle 2, Reason: Provider Judgment) Administration: 1,500 mg (09/16/2018), 1,500 mg (10/07/2018), 1,500 mg (10/28/2018)  for chemotherapy treatment.    Small cell carcinoma of lung, right ([place])  08/23/2018 - 11/01/2018 Chemotherapy   The patient had palonosetron (ALOXI) injection 0.25 mg, 0.25 mg, Intravenous,  Once, 4 of 4 cycles Administration: 0.25 mg (08/23/2018), 0.25 mg (09/16/2018), 0.25 mg (10/07/2018), 0.25 mg (10/28/2018) pegfilgrastim-cbqv (UDENYCA) injection 6 mg, 6 mg, Subcutaneous, Once, 4 of 4 cycles Administration: 6 mg (08/27/2018), 6 mg (09/20/2018), 6 mg (10/11/2018), 6 mg (11/01/2018) CARBOplatin (PARAPLATIN) 560 mg in sodium chloride 0.9 % 250 mL chemo infusion, 560 mg (100 % of original dose 564 mg), Intravenous,  Once, 4 of 4 cycles Dose modification:   (original dose 564 mg, Cycle 1),   (original dose 523.5 mg, Cycle 3),   (original dose 523.5 mg, Cycle 4) Administration: 560 mg (08/23/2018), 560 mg (09/16/2018), 520 mg (10/07/2018), 510 mg (10/28/2018) etoposide (VEPESID) 220 mg in sodium chloride 0.9 % 1,000 mL chemo infusion, 100 mg/m2 = 220 mg, Intravenous,  Once, 4 of 4 cycles Administration: 220 mg (08/23/2018), 200 mg (09/16/2018), 200 mg (09/17/2018), 200 mg (09/18/2018), 200 mg (10/07/2018), 200 mg (10/08/2018), 200 mg (10/09/2018), 200 mg (10/28/2018), 200 mg (10/29/2018), 200 mg (10/30/2018) fosaprepitant (EMEND) 150 mg, dexamethasone (DECADRON) 12 mg in sodium chloride 0.9 %  145 mL IVPB, , Intravenous,  Once, 4 of 4 cycles Administration:  (08/23/2018),  (09/16/2018),  (10/07/2018),  (10/28/2018) durvalumab (IMFINZI) 1,500 mg in sodium chloride 0.9 % 100 mL chemo infusion, 1,500 mg (100 % of  original dose 1,500 mg), Intravenous,  Once, 3 of 3 cycles Dose modification: 1,500 mg (original dose 1,500 mg, Cycle 2, Reason: Provider Judgment) Administration: 1,500 mg (09/16/2018), 1,500 mg (10/07/2018), 1,500 mg (10/28/2018)  for chemotherapy treatment.    08/26/2018 Initial Diagnosis   Small cell carcinoma of lung, right ([facility])   11/20/2018 -  Chemotherapy   The patient had durvalumab (IMFINZI) 1,500 mg in sodium chloride 0.9 % 100 mL chemo infusion, 1,500 mg (100 % of original dose 1,500 mg), Intravenous,  Once, 4 of 9 cycles Dose modification: 1,500 mg (original dose 1,500 mg, Cycle 1, Reason: Other (see comments)) Administration: 1,500 mg (11/20/2018), 1,500 mg (12/19/2018), 1,500 mg (01/16/2019), 1,500 mg (02/13/2019)  for chemotherapy treatment.      CANCER STAGING: Cancer Staging No matching staging information was found for the patient.  INTERVAL HISTORY:  [name], a 75 y.o. 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 [name] was last seen by Dr. Arletha Pili Iruku on 07/23/2020.   Today he reports feeling well. He now has a PT who comes to his home twice a week and exercising him. He now ambulates with a walker. He denies having any new pains, N/V, or headaches.  He lives with his brother now. He is scheduled to have his MRI brain on 03/31.   REVIEW OF SYSTEMS:  Review of Systems  Constitutional: Positive for fatigue (75%). Negative for appetite change.  Gastrointestinal: Negative for nausea and vomiting.  Musculoskeletal: Negative for arthralgias and myalgias.  Neurological: Positive for numbness (feet). Negative for headaches.  All other systems reviewed and are negative.   PAST MEDICAL/SURGICAL HISTORY:  Past Medical History:  Diagnosis Date  . Actinic keratosis   . Arthritis   . Colitis MAY 2012    CT ABD/PELVIS HEP FLEXURE  . COPD (chronic obstructive pulmonary disease) ([place])   . Diabetes mellitus  without complication ([place])   . History of kidney stones   . Hyperlipemia   . Hypertension   . NSAID long-term use NAPROXEN FOR OA  . SCL Ca dx'd 08/2018   Past Surgical History:  Procedure Laterality Date  . BACK SURGERY     spinal stenosis  . BASAL CELL CARCINOMA EXCISION  FACE, arms feet, leg  . CHOLECYSTECTOMY  JUNE 2011 MJ   STONES, PANCREATITIS  . KNEE ARTHROSCOPY WITH MEDIAL MENISECTOMY Right 03/24/2015   Procedure: KNEE ARTHROSCOPY WITH MEDIAL MENISECTOMY;  Surgeon: Carole Civil, MD;  Location: [facility];  Service: Orthopedics;  Laterality: Right;  . KNEE SURGERY Left LEFT   arthroscopy  . LITHOTRIPSY  80s  . PORTACATH PLACEMENT Left 09/11/2018   Procedure: INSERTION PORT-A-CATH;  Surgeon: Virl Cagey, MD;  Location: [facility];  Service: General;  Laterality: Left;  . SPINE SURGERY      SOCIAL HISTORY:  Social History   Socioeconomic History  . Marital status: Divorced    Spouse name: Not on file  . Number of children: 0  . Years of education: Not on file  . Highest education level: Not on file  Occupational History  . Occupation: [place] [employer]   . Occupation: Manufacturing engineer   Tobacco Use  . Smoking status: Former Smoker    Packs/day: 1.00  Years: 50.00    Pack years: 50.00    Types: Cigarettes    Quit date: 08/07/2018    Years since quitting: 2.2  . Smokeless tobacco: Never Used  Substance and Sexual Activity  . Alcohol use: Yes    Comment: once a month  . Drug use: No  . Sexual activity: Never    Birth control/protection: None  Other Topics Concern  . 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   Social Determinants of Health   Financial Resource Strain: Not on file  Food Insecurity: Not on file  Transportation Needs: Not on file  Physical Activity: Not on file  Stress: Not on file  Social Connections: Not on file  Intimate Partner Violence:  Not on file    FAMILY HISTORY:  Family History  Problem Relation Age of Onset  . Cancer Mother        lung  . Cancer Father        lung and liver  . Colon polyps Neg Hx   . Colon cancer Neg Hx     CURRENT MEDICATIONS:  Current Outpatient Medications  Medication Sig Dispense Refill  . aspirin 81 MG tablet Take 81 mg by mouth daily.    . Cholecalciferol 50 MCG (2000 UT) TBDP Take 1 tablet by mouth daily.    Marland Kitchen levothyroxine (SYNTHROID) 200 MCG tablet Take 200 mcg by mouth daily.    . metFORMIN (GLUCOPHAGE) 500 MG tablet Take 500 mg by mouth 2 (two) times daily with a meal.    . Multiple Vitamin (MULTIVITAMIN WITH MINERALS) TABS tablet Take 1 tablet by mouth daily.    . rosuvastatin (CRESTOR) 10 MG tablet Take 10 mg by mouth daily.    Marland Kitchen thiamine 100 MG tablet Take 1 tablet (100 mg total) by mouth daily. 30 tablet 2  . verapamil (COVERA HS) 240 MG (CO) 24 hr tablet Take 120 mg by mouth at bedtime.    Marland Kitchen albuterol (PROVENTIL HFA;VENTOLIN HFA) 108 (90 Base) MCG/ACT inhaler Inhale 2 puffs into the lungs every 6 (six) hours as needed for wheezing or shortness of breath.  (Patient not taking: Reported on 10/27/2020)     No current facility-administered medications for this visit.    ALLERGIES:  No Known Allergies  PHYSICAL EXAM:  Performance status (ECOG): 1 - Symptomatic but completely ambulatory  Vitals:   10/27/20 1414  BP: (!) 153/90  Pulse: 81  Resp: 18  Temp: 97.9 F (36.6 C)  SpO2: 97%   Wt Readings from Last 3 Encounters:  10/27/20 176 lb 12.8 oz (80.2 kg)  09/24/20 175 lb 14.8 oz (79.8 kg)  07/23/20 178 lb 14.4 oz (81.1 kg)   Physical Exam Vitals reviewed.  Constitutional:      Appearance: Normal appearance.     Comments: With walker  Cardiovascular:     Rate and Rhythm: Normal rate and regular rhythm.     Pulses: Normal pulses.     Heart sounds: Normal heart sounds.  Pulmonary:     Effort: Pulmonary effort is normal.     Breath sounds: Normal breath sounds.   Chest:  Breasts:     Right: No axillary adenopathy or supraclavicular adenopathy.     Left: No axillary adenopathy or supraclavicular adenopathy.    Musculoskeletal:     Right lower leg: No edema.     Left lower leg:  No edema.  Lymphadenopathy:     Upper Body:     Right upper body: No supraclavicular, axillary or pectoral adenopathy.     Left upper body: No supraclavicular, axillary or pectoral adenopathy.  Neurological:     General: No focal deficit present.     Mental Status: He is alert and oriented to person, place, and time.  Psychiatric:        Mood and Affect: Mood normal.        Behavior: Behavior normal.      LABORATORY DATA:  I have reviewed the labs as listed.  CBC Latest Ref Rng & Units 09/25/2020 09/24/2020 09/24/2020  WBC 4.0 - 10.5 K/uL 6.9 8.9 8.1  Hemoglobin 13.0 - 17.0 g/dL 13.3 14.4 13.5  Hematocrit 39.0 - 52.0 % 40.5 44.4 41.1  Platelets 150 - 400 K/uL 251 255 257   CMP Latest Ref Rng & Units 09/25/2020 09/24/2020 09/24/2020  Glucose 70 - 99 mg/dL 92 - 71  BUN 8 - 23 mg/dL 21 - 16  Creatinine 0.61 - 1.24 mg/dL 0.75 0.84 0.77  Sodium 135 - 145 mmol/L 139 - 139  Potassium 3.5 - 5.1 mmol/L 3.7 - 3.5  Chloride 98 - 111 mmol/L 103 - 107  CO2 22 - 32 mmol/L 26 - 24  Calcium 8.9 - 10.3 mg/dL 9.3 - 9.0  Total Protein 6.5 - 8.1 g/dL - - 6.5  Total Bilirubin 0.3 - 1.2 mg/dL - - 1.4(H)  Alkaline Phos 38 - 126 U/L - - 56  AST 15 - 41 U/L - - 16  ALT 0 - 44 U/L - - 10    DIAGNOSTIC IMAGING:  I have independently reviewed the scans and discussed with the patient. No results found.   ASSESSMENT:  1. Extensive stage small cell lung cancer: -4 cycles of carboplatin, VP-16 and durvalumab from 08/23/2018 through 10/28/2018. -Durvalumab maintenance last dose on 02/13/2019, subsequent doses held due to colitis. -We reviewed results and images of the CT scan dated 12/05/2019. Bilateral pleural effusions have improved. No pathologically enlarged lymphadenopathy.  Bilateral perihilar and paramedian fibrosis and consolidation is also stable. -I have reviewed his labs which are grossly within normal limits. I plan to see him back in 3 months with repeat labs and scans. -CT CAP on 03/09/2020 shows stable radiation changes involving the paramediastinal lungs and hilar regions bilaterally.  No evidence of recurrence or metastatic disease.  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  Stable small left adrenal gland nodules.  2. Metastatic disease to the brain: -He had 3 small subcentimeter meta stasis in the right insula, right occipital lobe and right cerebellar hemisphere, treated with whole brain RT from 09/02/2018 through 09/13/2018. -MRI of the brain on 09/18/2019 did not show any evidence of intracranial metastasis. -Brain MRI on 01/16/2020 no brain metastasis.  Stable to slightly increased postradiation changes in the cerebral white matter.  3. Colitis: -He experienced diarrhea with blood when he was on durvalumab and was treated with prednisone in the first week of August 2020, gradually tapered off around 04/27/2019. -He does not have any GI symptoms at this time.   PLAN:  1. Extensive stage small cell lung cancer: -He does not have any clinical signs or symptoms of recurrence. -Last imaging in December 2021 did not show any evidence of recurrence or metastatic disease. -He is using walker to ambulate because of balance issues.  He has not been falling since he started using the walker.  He is also doing physical  therapy at home. -Reviewed his labs and records from recent hospitalization. -Recommend follow-up in 2 months with repeat CT CAP and routine labs.  2. Metastatic disease to the brain: -He has an MRI scheduled on 11/04/2020 by Dr. Mickeal Skinner.   Orders placed this encounter:  No orders of the defined types were placed in this encounter.    Derek Jack, MD [place] [phone]   I, Milinda Antis, am acting as a scribe for Dr. Sanda Linger.  I, Derek Jack MD, have reviewed the above documentation for accuracy and completeness, and I agree with the above.

## 2020-10-28 ENCOUNTER — Telehealth: Payer: Self-pay | Admitting: Nurse Practitioner

## 2020-10-28 ENCOUNTER — Inpatient Hospital Stay: Payer: TRICARE For Life (TFL) | Admitting: Internal Medicine

## 2020-10-28 ENCOUNTER — Encounter: Payer: Self-pay | Admitting: Cardiology

## 2020-10-28 ENCOUNTER — Ambulatory Visit (INDEPENDENT_AMBULATORY_CARE_PROVIDER_SITE_OTHER): Payer: Medicare Other | Admitting: Cardiology

## 2020-10-28 VITALS — BP 134/88 | HR 67 | Ht 71.0 in | Wt 159.6 lb

## 2020-10-28 DIAGNOSIS — R011 Cardiac murmur, unspecified: Secondary | ICD-10-CM

## 2020-10-28 DIAGNOSIS — I440 Atrioventricular block, first degree: Secondary | ICD-10-CM | POA: Diagnosis not present

## 2020-10-28 NOTE — Patient Instructions (Signed)
Medication Instructions:  Your physician recommends that you continue on your current medications as directed. Please refer to the Current Medication list given to you today.  *If you need a refill on your cardiac medications before your next appointment, please call your pharmacy*   Lab Work: Noen If you have labs (blood work) drawn today and your tests are completely normal, you will receive your results only by: Marland Kitchen MyChart Message (if you have MyChart) OR . A paper copy in the mail If you have any lab test that is abnormal or we need to change your treatment, we will call you to review the results.   Testing/Procedures: Your physician has requested that you have an echocardiogram. Echocardiography is a painless test that uses sound waves to create images of your heart. It provides your doctor with information about the size and shape of your heart and how well your heart's chambers and valves are working. This procedure takes approximately one hour. There are no restrictions for this procedure.     Follow-Up: At [facility], you and your health needs are our priority.  As part of our continuing mission to provide you with exceptional heart care, we have created designated Provider Care Teams.  These Care Teams include your primary Cardiologist (physician) and Advanced Practice Providers (APPs -  Physician Assistants and Nurse Practitioners) who all work together to provide you with the care you need, when you need it.  We recommend signing up for the patient portal called "MyChart".  Sign up information is provided on this After Visit Summary.  MyChart is used to connect with patients for Virtual Visits (Telemedicine).  Patients are able to view lab/test results, encounter notes, upcoming appointments, etc.  Non-urgent messages can be sent to your provider as well.   To learn more about what you can do with MyChart, go to [email or web address].    Your next appointment:   Follow up  as needed   Other Instructions  Echocardiogram An echocardiogram is a test that uses sound waves (ultrasound) to produce images of the heart. Images from an echocardiogram can provide important information about:  Heart size and shape.  The size and thickness and movement of your heart's walls.  Heart muscle function and strength.  Heart valve function or if you have stenosis. Stenosis is when the heart valves are too narrow.  If blood is flowing backward through the heart valves (regurgitation).  A tumor or infectious growth around the heart valves.  Areas of heart muscle that are not working well because of poor blood flow or injury from a heart attack.  Aneurysm detection. An aneurysm is a weak or damaged part of an artery wall. The wall bulges out from the normal force of blood pumping through the body. Tell a health care provider about:  Any allergies you have.  All medicines you are taking, including vitamins, herbs, eye drops, creams, and over-the-counter medicines.  Any blood disorders you have.  Any surgeries you have had.  Any medical conditions you have.  Whether you are pregnant or may be pregnant. What are the risks? Generally, this is a safe test. However, problems may occur, including an allergic reaction to dye (contrast) that may be used during the test. What happens before the test? No specific preparation is needed. You may eat and drink normally. What happens during the test?  You will take off your clothes from the waist up and put on a hospital gown.  Electrodes or electrocardiogram (ECG)patches may  be placed on your chest. The electrodes or patches are then connected to a device that monitors your heart rate and rhythm.  You will lie down on a table for an ultrasound exam. A gel will be applied to your chest to help sound waves pass through your skin.  A handheld device, called a transducer, will be pressed against your chest and moved over your  heart. The transducer produces sound waves that travel to your heart and bounce back (or "echo" back) to the transducer. These sound waves will be captured in real-time and changed into images of your heart that can be viewed on a video monitor. The images will be recorded on a computer and reviewed by your health care provider.  You may be asked to change positions or hold your breath for a short time. This makes it easier to get different views or better views of your heart.  In some cases, you may receive contrast through an IV in one of your veins. This can improve the quality of the pictures from your heart. The procedure may vary among health care providers and hospitals.   What can I expect after the test? You may return to your normal, everyday life, including diet, activities, and medicines, unless your health care provider tells you not to do that. Follow these instructions at home:  It is up to you to get the results of your test. Ask your health care provider, or the department that is doing the test, when your results will be ready.  Keep all follow-up visits. This is important. Summary  An echocardiogram is a test that uses sound waves (ultrasound) to produce images of the heart.  Images from an echocardiogram can provide important information about the size and shape of your heart, heart muscle function, heart valve function, and other possible heart problems.  You do not need to do anything to prepare before this test. You may eat and drink normally.  After the echocardiogram is completed, you may return to your normal, everyday life, unless your health care provider tells you not to do that. This information is not intended to replace advice given to you by your health care provider. Make sure you discuss any questions you have with your health care provider. Document Revised: 03/16/2020 Document Reviewed: 03/16/2020 Elsevier Patient Education  2021 [employer].

## 2020-10-28 NOTE — Telephone Encounter (Signed)
Returned call to patient's sister, Quita Skye, and she said that she needed to reschedule the Palliative Consult for 11/11/20 due to patient has another MD appointment that day.  Asked Quita Skye if it would be okay if I called her back tomorrow because I needed look at NP's schedule to see where I could get him worked in and she was in agreement with this.

## 2020-10-28 NOTE — Telephone Encounter (Signed)
Spoke with Quita Skye to see if we could reschedule the Consult for 12/02/20 @ 3 PM, I asked her if this was too far out and she said no and was in agreement with this.  Sister stated that he is still getting home health services coming in and he has several MD appointments coming up and that this date would be fine.  I told her to call us if something changes with him and we need to see him sooner and she agreed to call.

## 2020-10-28 NOTE — Progress Notes (Signed)
Clinical Summary Mr. Deneault is a 75 y.o.male seen as a new patient for the following medical problems.   1. First degree AV block - noted during recent admission, he was referred to cardiology clinic - no lightheadenss or dizziness, no syncope.  - pcp stopped verapamil recently     Past Medical History:  Diagnosis Date  . Actinic keratosis   . Arthritis   . Colitis MAY 2012    CT ABD/PELVIS HEP FLEXURE  . COPD (chronic obstructive pulmonary disease) ([place])   . Diabetes mellitus without complication ([place])   . History of kidney stones   . Hyperlipemia   . Hypertension   . NSAID long-term use NAPROXEN FOR OA  . SCL Ca dx'd 08/2018     No Known Allergies   Current Outpatient Medications  Medication Sig Dispense Refill  . albuterol (PROVENTIL HFA;VENTOLIN HFA) 108 (90 Base) MCG/ACT inhaler Inhale 2 puffs into the lungs every 6 (six) hours as needed for wheezing or shortness of breath.  (Patient not taking: Reported on 10/27/2020)    . aspirin 81 MG tablet Take 81 mg by mouth daily.    . Cholecalciferol 50 MCG (2000 UT) TBDP Take 1 tablet by mouth daily.    Marland Kitchen levothyroxine (SYNTHROID) 200 MCG tablet Take 200 mcg by mouth daily.    . metFORMIN (GLUCOPHAGE) 500 MG tablet Take 500 mg by mouth 2 (two) times daily with a meal.    . Multiple Vitamin (MULTIVITAMIN WITH MINERALS) TABS tablet Take 1 tablet by mouth daily.    . rosuvastatin (CRESTOR) 10 MG tablet Take 10 mg by mouth daily.    Marland Kitchen thiamine 100 MG tablet Take 1 tablet (100 mg total) by mouth daily. 30 tablet 2  . verapamil (COVERA HS) 240 MG (CO) 24 hr tablet Take 120 mg by mouth at bedtime.     No current facility-administered medications for this visit.     Past Surgical History:  Procedure Laterality Date  . BACK SURGERY     spinal stenosis  . BASAL CELL CARCINOMA EXCISION  FACE, arms feet, leg  . CHOLECYSTECTOMY  JUNE 2011 MJ   STONES, PANCREATITIS  . KNEE ARTHROSCOPY WITH MEDIAL MENISECTOMY Right 03/24/2015    Procedure: KNEE ARTHROSCOPY WITH MEDIAL MENISECTOMY;  Surgeon: Carole Civil, MD;  Location: [facility];  Service: Orthopedics;  Laterality: Right;  . KNEE SURGERY Left LEFT   arthroscopy  . LITHOTRIPSY  80s  . PORTACATH PLACEMENT Left 09/11/2018   Procedure: INSERTION PORT-A-CATH;  Surgeon: Virl Cagey, MD;  Location: [facility];  Service: General;  Laterality: Left;  . SPINE SURGERY       No Known Allergies    Family History  Problem Relation Age of Onset  . Cancer Mother        lung  . Cancer Father        lung and liver  . Colon polyps Neg Hx   . Colon cancer Neg Hx      Social History Mr. Fitton reports that he quit smoking about 2 years ago. His smoking use included cigarettes. He has a 50.00 pack-year smoking history. He has never used smokeless tobacco. Mr. Arora reports current alcohol use.   Review of Systems CONSTITUTIONAL: No weight loss, fever, chills, weakness or fatigue.  HEENT: Eyes: No visual loss, blurred vision, double vision or yellow sclerae.No hearing loss, sneezing, congestion, runny nose or sore throat.  SKIN: No rash or itching.  CARDIOVASCULAR: per hpi RESPIRATORY: No  shortness of breath, cough or sputum.  GASTROINTESTINAL: No anorexia, nausea, vomiting or diarrhea. No abdominal pain or blood.  GENITOURINARY: No burning on urination, no polyuria NEUROLOGICAL: No headache, dizziness, syncope, paralysis, ataxia, numbness or tingling in the extremities. No change in bowel or bladder control.  MUSCULOSKELETAL: No muscle, back pain, joint pain or stiffness.  LYMPHATICS: No enlarged nodes. No history of splenectomy.  PSYCHIATRIC: No history of depression or anxiety.  ENDOCRINOLOGIC: No reports of sweating, cold or heat intolerance. No polyuria or polydipsia.  Marland Kitchen   Physical Examination Today's Vitals   10/28/20 0902  BP: 134/88  Pulse: 67  SpO2: 95%  Weight: 159 lb 9.6 oz (72.4 kg)  Height: 5\' 11"  (1.803 m)   Body mass index is 22.26  kg/m.  Gen: resting comfortably, no acute distress HEENT: no scleral icterus, pupils equal round and reactive, no palptable cervical adenopathy,  CV: RRR, 3/6 systolic murmur rusb, no jvd Resp: Clear to auscultation bilaterally GI: abdomen is soft, non-tender, non-distended, normal bowel sounds, no hepatosplenomegaly MSK: extremities are warm, no edema.  Skin: warm, no rash Neuro:  no focal deficits Psych: appropriate affect    Assessment and Plan  1. First degree AV block - overall benign diagnosis - he is asymptomatic - no further workup at this time - would avoid av nodal agents - EKG today shows SR, PR interval right at 244ms  2. Heart murmur - obtain echo   F/u pending echo, if benign echo then f/u just as needed      Arnoldo Lenis, M.D.

## 2020-11-01 ENCOUNTER — Other Ambulatory Visit: Payer: TRICARE For Life (TFL)

## 2020-11-04 ENCOUNTER — Telehealth: Payer: Self-pay | Admitting: Internal Medicine

## 2020-11-04 ENCOUNTER — Other Ambulatory Visit: Payer: TRICARE For Life (TFL)

## 2020-11-04 NOTE — Telephone Encounter (Signed)
Scheduled appt per 3/31 sch msg. Pt aware.  

## 2020-11-08 ENCOUNTER — Inpatient Hospital Stay: Payer: Medicare Other

## 2020-11-08 ENCOUNTER — Inpatient Hospital Stay: Payer: Medicare Other | Admitting: Internal Medicine

## 2020-11-10 ENCOUNTER — Ambulatory Visit
Admission: RE | Admit: 2020-11-10 | Discharge: 2020-11-10 | Disposition: A | Discharge status: Home / Self Care | Payer: Medicare Other | Source: Ambulatory Visit | Attending: Internal Medicine | Admitting: Internal Medicine

## 2020-11-10 ENCOUNTER — Observation Stay (HOSPITAL_COMMUNITY): Payer: Medicare Other

## 2020-11-10 ENCOUNTER — Encounter (HOSPITAL_COMMUNITY): Payer: Self-pay

## 2020-11-10 ENCOUNTER — Other Ambulatory Visit: Payer: Self-pay

## 2020-11-10 ENCOUNTER — Observation Stay (HOSPITAL_COMMUNITY)
Admission: EM | Admit: 2020-11-10 | Discharge: 2020-11-11 | Disposition: A | Discharge status: Home / Self Care | Payer: Medicare Other | Attending: Internal Medicine | Admitting: Internal Medicine

## 2020-11-10 DIAGNOSIS — Z7189 Other specified counseling: Secondary | ICD-10-CM | POA: Diagnosis not present

## 2020-11-10 DIAGNOSIS — I639 Cerebral infarction, unspecified: Secondary | ICD-10-CM | POA: Diagnosis present

## 2020-11-10 DIAGNOSIS — J449 Chronic obstructive pulmonary disease, unspecified: Secondary | ICD-10-CM | POA: Insufficient documentation

## 2020-11-10 DIAGNOSIS — F17210 Nicotine dependence, cigarettes, uncomplicated: Secondary | ICD-10-CM | POA: Insufficient documentation

## 2020-11-10 DIAGNOSIS — C7931 Secondary malignant neoplasm of brain: Secondary | ICD-10-CM

## 2020-11-10 DIAGNOSIS — E876 Hypokalemia: Secondary | ICD-10-CM | POA: Diagnosis not present

## 2020-11-10 DIAGNOSIS — Z66 Do not resuscitate: Secondary | ICD-10-CM | POA: Insufficient documentation

## 2020-11-10 DIAGNOSIS — I6389 Other cerebral infarction: Secondary | ICD-10-CM | POA: Diagnosis not present

## 2020-11-10 DIAGNOSIS — Z20822 Contact with and (suspected) exposure to covid-19: Secondary | ICD-10-CM | POA: Diagnosis not present

## 2020-11-10 DIAGNOSIS — D649 Anemia, unspecified: Secondary | ICD-10-CM | POA: Diagnosis not present

## 2020-11-10 DIAGNOSIS — E039 Hypothyroidism, unspecified: Secondary | ICD-10-CM | POA: Insufficient documentation

## 2020-11-10 DIAGNOSIS — E538 Deficiency of other specified B group vitamins: Secondary | ICD-10-CM | POA: Diagnosis not present

## 2020-11-10 DIAGNOSIS — C3491 Malignant neoplasm of unspecified part of right bronchus or lung: Secondary | ICD-10-CM | POA: Diagnosis present

## 2020-11-10 DIAGNOSIS — E785 Hyperlipidemia, unspecified: Secondary | ICD-10-CM | POA: Diagnosis not present

## 2020-11-10 DIAGNOSIS — E119 Type 2 diabetes mellitus without complications: Secondary | ICD-10-CM | POA: Diagnosis not present

## 2020-11-10 DIAGNOSIS — Z7982 Long term (current) use of aspirin: Secondary | ICD-10-CM | POA: Diagnosis not present

## 2020-11-10 DIAGNOSIS — Z79899 Other long term (current) drug therapy: Secondary | ICD-10-CM | POA: Insufficient documentation

## 2020-11-10 LAB — COMPREHENSIVE METABOLIC PANEL
ALT: 8 U/L (ref 0–44)
AST: 13 U/L — ABNORMAL LOW (ref 15–41)
Albumin: 4.1 g/dL (ref 3.5–5.0)
Alkaline Phosphatase: 61 U/L (ref 38–126)
Anion gap: 7 (ref 5–15)
BUN: 16 mg/dL (ref 8–23)
CO2: 27 mmol/L (ref 22–32)
Calcium: 9.4 mg/dL (ref 8.9–10.3)
Chloride: 109 mmol/L (ref 98–111)
Creatinine, Ser: 0.86 mg/dL (ref 0.61–1.24)
GFR, Estimated: >60 mL/min (ref >60)
Glucose, Bld: 91 mg/dL (ref 70–99)
Potassium: 3.8 mmol/L (ref 3.5–5.1)
Sodium: 143 mmol/L (ref 135–145)
Total Bilirubin: 0.8 mg/dL (ref 0.3–1.2)
Total Protein: 7 g/dL (ref 6.5–8.1)

## 2020-11-10 LAB — RAPID URINE DRUG SCREEN, HOSP PERFORMED
Amphetamines: NOT DETECTED
Barbiturates: NOT DETECTED
Benzodiazepines: NOT DETECTED
Cocaine: NOT DETECTED
Opiates: NOT DETECTED
Tetrahydrocannabinol: NOT DETECTED

## 2020-11-10 LAB — URINALYSIS, ROUTINE W REFLEX MICROSCOPIC
Bacteria, UA: NONE SEEN
Bilirubin Urine: NEGATIVE
Glucose, UA: NEGATIVE mg/dL
Ketones, ur: NEGATIVE mg/dL
Leukocytes,Ua: NEGATIVE
Nitrite: NEGATIVE
Protein, ur: NEGATIVE mg/dL
Specific Gravity, Urine: 1.013 (ref 1.005–1.030)
pH: 5 (ref 5.0–8.0)

## 2020-11-10 LAB — DIFFERENTIAL
Abs Immature Granulocytes: 0.02 10*3/uL (ref 0.00–0.07)
Basophils Absolute: 0 10*3/uL (ref 0.0–0.1)
Basophils Relative: 0 %
Eosinophils Absolute: 0.1 10*3/uL (ref 0.0–0.5)
Eosinophils Relative: 1 %
Immature Granulocytes: 0 %
Lymphocytes Relative: 24 %
Lymphs Abs: 1.7 10*3/uL (ref 0.7–4.0)
Monocytes Absolute: 0.5 10*3/uL (ref 0.1–1.0)
Monocytes Relative: 6 %
Neutro Abs: 4.8 10*3/uL (ref 1.7–7.7)
Neutrophils Relative %: 69 %

## 2020-11-10 LAB — CBC
HCT: 41.3 % (ref 39.0–52.0)
Hemoglobin: 13.3 g/dL (ref 13.0–17.0)
MCH: 31.8 pg (ref 26.0–34.0)
MCHC: 32.2 g/dL (ref 30.0–36.0)
MCV: 98.8 fL (ref 80.0–100.0)
Platelets: 235 10*3/uL (ref 150–400)
RBC: 4.18 MIL/uL — ABNORMAL LOW (ref 4.22–5.81)
RDW: 14.8 % (ref 11.5–15.5)
WBC: 7 10*3/uL (ref 4.0–10.5)
nRBC: 0 % (ref 0.0–0.2)

## 2020-11-10 LAB — I-STAT CHEM 8, ED
BUN: 11 mg/dL (ref 8–23)
Calcium, Ion: 1.07 mmol/L — ABNORMAL LOW (ref 1.15–1.40)
Chloride: 110 mmol/L (ref 98–111)
Creatinine, Ser: 0.6 mg/dL — ABNORMAL LOW (ref 0.61–1.24)
Glucose, Bld: 71 mg/dL (ref 70–99)
HCT: 30 % — ABNORMAL LOW (ref 39.0–52.0)
Hemoglobin: 10.2 g/dL — ABNORMAL LOW (ref 13.0–17.0)
Potassium: 3 mmol/L — ABNORMAL LOW (ref 3.5–5.1)
Sodium: 144 mmol/L (ref 135–145)
TCO2: 20 mmol/L — ABNORMAL LOW (ref 22–32)

## 2020-11-10 LAB — HEMOGLOBIN AND HEMATOCRIT, BLOOD
HCT: 39.9 % (ref 39.0–52.0)
Hemoglobin: 13.3 g/dL (ref 13.0–17.0)

## 2020-11-10 LAB — APTT: aPTT: 34 seconds (ref 24–36)

## 2020-11-10 LAB — ETHANOL: Alcohol, Ethyl (B): <10 mg/dL (ref <10)

## 2020-11-10 LAB — PROTIME-INR
INR: 1.1 (ref 0.8–1.2)
Prothrombin Time: 13.3 seconds (ref 11.4–15.2)

## 2020-11-10 LAB — TSH: TSH: 44.212 u[IU]/mL — ABNORMAL HIGH (ref 0.350–4.500)

## 2020-11-10 LAB — RESP PANEL BY RT-PCR (FLU A&B, COVID) ARPGX2
Influenza A by PCR: NEGATIVE
Influenza B by PCR: NEGATIVE
SARS Coronavirus 2 by RT PCR: NEGATIVE

## 2020-11-10 LAB — VITAMIN B12: Vitamin B-12: 258 pg/mL (ref 180–914)

## 2020-11-10 MED ORDER — STROKE: EARLY STAGES OF RECOVERY BOOK
Freq: Once | Status: AC
  Filled 2020-11-10: qty 1

## 2020-11-10 MED ORDER — ENOXAPARIN SODIUM 40 MG/0.4ML ~~LOC~~ SOLN
40.0000 mg | SUBCUTANEOUS | Status: DC
  Administered 2020-11-10: 40 mg via SUBCUTANEOUS
  Filled 2020-11-10: qty 0.4

## 2020-11-10 MED ORDER — ALBUTEROL SULFATE HFA 108 (90 BASE) MCG/ACT IN AERS
2.0000 | INHALATION_SPRAY | Freq: Four times a day (QID) | RESPIRATORY_TRACT | Status: DC | PRN
  Filled 2020-11-10: qty 6.7

## 2020-11-10 MED ORDER — ACETAMINOPHEN 160 MG/5ML PO SOLN: 650.0000 mg | ORAL | Status: DC | PRN

## 2020-11-10 MED ORDER — ACETAMINOPHEN 325 MG PO TABS: 650.0000 mg | ORAL_TABLET | ORAL | Status: DC | PRN

## 2020-11-10 MED ORDER — ACETAMINOPHEN 650 MG RE SUPP: 650.0000 mg | RECTAL | Status: DC | PRN

## 2020-11-10 MED ORDER — ROSUVASTATIN CALCIUM 5 MG PO TABS
10.0000 mg | ORAL_TABLET | Freq: Every day | ORAL | Status: DC
  Filled 2020-11-10: qty 2
  Filled 2020-11-10: qty 1

## 2020-11-10 MED ORDER — POTASSIUM CHLORIDE CRYS ER 20 MEQ PO TBCR
40.0000 meq | EXTENDED_RELEASE_TABLET | Freq: Once | ORAL | Status: AC
  Administered 2020-11-10: 40 meq via ORAL
  Filled 2020-11-10: qty 2

## 2020-11-10 MED ORDER — ASPIRIN EC 81 MG PO TBEC
81.0000 mg | DELAYED_RELEASE_TABLET | Freq: Every day | ORAL | Status: DC
  Administered 2020-11-10 – 2020-11-11 (×2): 81 mg via ORAL
  Filled 2020-11-10 (×2): qty 1

## 2020-11-10 MED ORDER — ADULT MULTIVITAMIN W/MINERALS CH
1.0000 | ORAL_TABLET | Freq: Every day | ORAL | Status: DC
  Administered 2020-11-10 – 2020-11-11 (×2): 1 via ORAL
  Filled 2020-11-10 (×2): qty 1

## 2020-11-10 MED ORDER — VITAMIN B-12 1000 MCG PO TABS
1000.0000 ug | ORAL_TABLET | Freq: Every day | ORAL | Status: DC
  Administered 2020-11-11: 1000 ug via ORAL
  Filled 2020-11-10: qty 1

## 2020-11-10 MED ORDER — GADOBENATE DIMEGLUMINE 529 MG/ML IV SOLN
16.0000 mL | Freq: Once | INTRAVENOUS | Status: AC | PRN
  Administered 2020-11-10: 15 mL via INTRAVENOUS

## 2020-11-10 MED ORDER — LEVOTHYROXINE SODIUM 100 MCG PO TABS
200.0000 ug | ORAL_TABLET | Freq: Every day | ORAL | Status: DC
  Administered 2020-11-11: 200 ug via ORAL
  Filled 2020-11-10 (×2): qty 2

## 2020-11-10 MED ORDER — IOHEXOL 350 MG/ML SOLN
75.0000 mL | Freq: Once | INTRAVENOUS | Status: AC | PRN
  Administered 2020-11-10: 75 mL via INTRAVENOUS

## 2020-11-10 NOTE — ED Notes (Signed)
Called [facility] [place]- for report   Nurse said they will call back

## 2020-11-10 NOTE — H&P (Signed)
History and Physical        Hospital Admission Note Date: 11/10/2020  Patient name: [name] Medical record number: [id number] Date of birth: Dec 18, 1945 Age: 75 y.o. Gender: male  PCP: Lucia Gaskins, MD   Chief Complaint    Chief Complaint  Patient presents with  . 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  Patient went for MRI brain for routine follow-up and was noted to have an acute infarct in the left caudate and corona radiata and questionable new metastatic lesion in the right temporal lobe and was sent to the ED for further evaluation.  Admits to slurred speech for the past month but no acute neurologic changes over the past 24-48 hours. He denies any facial droop, chest pain, palpitations, or any other complaints.   He was also recently admitted in February for generalized weakness without any etiology discovered.   ED Course: Afebrile, hemodynamically stable, on room air. Notable Labs: K3.0, Hb 10.2 (13.3 twenty minutes prior, likely error and will repeat), COVID-19 pending. Notable Imaging: MRI brain-acute infarct of left caudate and overlying corona radiata, 3 mm cortical enhancement in the posterior right temporal lobe concerning for new metastasis, left pontine infarct which does not appear acute but is new since January 15, 2020 and post radiation changes noted. Patient received KCl.  Neurology was consulted by ED physician who recommended admit to [facility].    Vitals:   11/10/20 1310  BP: (!) 151/91  Pulse: 61  Resp: 18  Temp: 98.1 F (36.7 C)  SpO2: 99%     Review of Systems:  Review of Systems  All other systems reviewed and are negative.   Medical/Social/Family History    Past Medical History: Past Medical History:  Diagnosis Date  . Actinic keratosis   . Arthritis   . Colitis MAY 2012    CT ABD/PELVIS HEP FLEXURE  . COPD (chronic obstructive pulmonary disease) ([place])   . Diabetes mellitus without complication ([place])   . History of kidney stones   . Hyperlipemia   . Hypertension   . NSAID long-term use NAPROXEN FOR OA  . SCL Ca dx'd 08/2018    Past Surgical History:  Procedure Laterality Date  . BACK SURGERY     spinal stenosis  . BASAL CELL CARCINOMA EXCISION  FACE, arms feet, leg  . CHOLECYSTECTOMY  JUNE 2011 MJ   STONES, PANCREATITIS  . KNEE ARTHROSCOPY WITH MEDIAL MENISECTOMY Right 03/24/2015   Procedure: KNEE ARTHROSCOPY WITH MEDIAL MENISECTOMY;  Surgeon: Carole Civil, MD;  Location: [facility];  Service: Orthopedics;  Laterality: Right;  . KNEE SURGERY Left LEFT   arthroscopy  . LITHOTRIPSY  80s  . PORTACATH PLACEMENT Left 09/11/2018   Procedure: INSERTION PORT-A-CATH;  Surgeon: Virl Cagey, MD;  Location: [facility];  Service: General;  Laterality: Left;  . SPINE SURGERY      Medications: Prior to Admission medications   Medication Sig Start Date End Date Taking? Authorizing Provider  albuterol (PROVENTIL HFA;VENTOLIN HFA) 108 (90  Base) MCG/ACT inhaler Inhale 2 puffs into the lungs every 6 (six) hours as needed for wheezing or shortness of breath. 06/22/18  Yes [provider]  aspirin 81 MG tablet Take 81 mg by mouth daily.   Yes [provider]  Cholecalciferol 50 MCG (2000 UT) TBDP Take 1 tablet by mouth daily.   Yes [provider]  levothyroxine (SYNTHROID) 200 MCG tablet Take 200 mcg by mouth daily. 09/03/20  Yes [provider]  Multiple Vitamin (MULTIVITAMIN WITH MINERALS) TABS tablet Take 1 tablet by mouth daily. 09/26/20  Yes Johnson, Clanford L, MD  rosuvastatin (CRESTOR) 10 MG tablet Take 10 mg by mouth daily.   Yes [provider]  thiamine 100 MG tablet Take 1 tablet (100 mg  total) by mouth daily. 09/26/20  Yes Johnson, Clanford L, MD    Allergies:  No Known Allergies  Social History:  reports that he has been smoking cigarettes. He has a 50.00 pack-year smoking history. He has never used smokeless tobacco. He reports current alcohol use. He reports that he does not use drugs.  Family History: Family History  Problem Relation Age of Onset  . Cancer Mother        lung  . Cancer Father        lung and liver  . Colon polyps Neg Hx   . Colon cancer Neg Hx      Objective   Physical Exam: Blood pressure (!) 151/91, pulse 61, temperature 98.1 F (36.7 C), temperature source Oral, resp. rate 18, SpO2 99 %.  Physical Exam Vitals and nursing note reviewed.  Constitutional:      Appearance: Normal appearance.  HENT:     Head: Normocephalic and atraumatic.  Eyes:     Conjunctiva/sclera: Conjunctivae normal.  Cardiovascular:     Rate and Rhythm: Normal rate and regular rhythm.     Heart sounds: Murmur heard.    Pulmonary:     Effort: Pulmonary effort is normal.     Breath sounds: Normal breath sounds.  Abdominal:     General: Abdomen is flat.     Palpations: Abdomen is soft.  Musculoskeletal:        General: No swelling or tenderness.  Skin:    Coloration: Skin is not jaundiced or pale.  Neurological:     Mental Status: He is alert and oriented to person, place, and time.     Sensory: No sensory deficit.     Motor: No weakness.     Comments: Slight slurred speech  Psychiatric:        Mood and Affect: Mood normal.        Behavior: Behavior normal.     LABS on Admission: I have personally reviewed all the labs and imaging below    Basic Metabolic Panel: Recent Labs  Lab 11/10/20 1425 11/10/20 1446  NA 143 144  K 3.8 3.0*  CL 109 110  CO2 27  --   GLUCOSE 91 71  BUN 16 11  CREATININE 0.86 0.60*  CALCIUM 9.4  --    Liver Function Tests: Recent Labs  Lab 11/10/20 1425  AST 13*  ALT 8  ALKPHOS 61  BILITOT 0.8  PROT 7.0   ALBUMIN 4.1   No results for input(s): LIPASE, AMYLASE in the last 168 hours. No results for input(s): AMMONIA in the last 168 hours. CBC: Recent Labs  Lab 11/10/20 1425 11/10/20 1446  WBC 7.0  --   NEUTROABS 4.8  --   HGB  13.3 10.2*  HCT 41.3 30.0*  MCV 98.8  --   PLT 235  --    Cardiac Enzymes: No results for input(s): CKTOTAL, CKMB, CKMBINDEX, TROPONINI in the last 168 hours. BNP: Invalid input(s): POCBNP CBG: No results for input(s): GLUCAP in the last 168 hours.  Radiological Exams on Admission:  MR BRAIN W WO CONTRAST  Result Date: 11/10/2020 CLINICAL DATA:  Brain/CNS neoplasm, assess treatment response. EXAM: MRI HEAD WITHOUT AND WITH CONTRAST TECHNIQUE: Multiplanar, multiecho pulse sequences of the brain and surrounding structures were obtained without and with intravenous contrast. CONTRAST:  16 mL of MultiHance IV. COMPARISON:  MRI June 16, 2020 and MRI January 15, 2020. FINDINGS: Brain: Acute infarct involving the left caudate and overlying corona radiata. No substantial mass effect. No acute hemorrhage. Previously seen punctate focus of restricted diffusion in the right para hippocampal region has resolved and there is no abnormal enhancement in this region. Left pontine infarct which does not restrict diffusion but is new since January 15, 2020. Similar advanced T2/FLAIR hyperintensity within the white matter, likely due to radiation. No hydrocephalus. Approximately 3 mm focus of cortical enhancement in the posterior right temporal lobe (series 12, image 82 and series 11, image 74), which is not present on the most recent prior contrasted study from January 15, 2020. No hydrocephalus. No midline shift. Basal cisterns are patent. Vascular: Major arterial flow voids are maintained at the skull base. Skull and upper cervical spine: Normal marrow signal. Sinuses/Orbits: Clear sinuses.  Unremarkable orbits. Other: Small left and trace right mastoid effusions. IMPRESSION: 1. Acute  infarct involving the left caudate and overlying corona radiata. No substantial mass effect. 2. Approximately 3 mm focus of cortical enhancement in the posterior right temporal lobe, concerning for a new metastasis. 3. Left pontine infarct which does not appear acute but is new since January 15, 2020. 4. Similar post-radiation changes. Acute stroke findings discussed with Dr. Mickeal Skinner via telephone at 12:25 PM who recommended that the patient go to the ED. I conveyed this to the tech Lonn Georgia) at 12:35 PM. Electronically Signed   By: Margaretha Sheffield MD   On: 11/10/2020 13:03      EKG: unchanged from previous tracings, normal sinus rhythm, RBBB, 1st degree AV block   A & P   Principal Problem:   Stroke (cerebrum) ([place]) Active Problems:   DNR (do not resuscitate) discussion   Small cell carcinoma of lung, right ([place])   Brain metastasis ([place])   1. 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 Transfer to [facility] b. HbA1c c. Lipid panel d. Continue aspirin e. Continue statin and likely increase pending lipid panel f. Echo g. Telemetry h. PT eval i. SLP eval j. Contact neurology once he arrives at [facility]  2. Concern for new 3 mm posterior right temporal lobe metastasis with history of small cell carcinoma of the right lung with brain mets  a. Will notify the patient's oncologist, Dr. Delton Coombes  3. Hypertension a. Stable not on any medications  4. Hyperlipidemia a. Continue statin b. Checking lipid panel  5. Hypothyroidism a. Continue home levothyroxine 200 mcg b. Check TSH  6. Anemia a. Hb 13.3->10.2 in twenty minutes. Likely lab error b. Will recheck Hb   7. Hypokalemia a. replete     DVT prophylaxis: lovenox   Code Status: DNR  Diet: heart healthy when tolerating PO Family Communication: Admission, patients condition and plan of care including tests being ordered have been discussed with the patient  who indicates understanding and agrees with the plan and  Code Status. Patient's sister was updated  Disposition Plan: The appropriate patient status for this patient is OBSERVATION. Observation status is judged to be reasonable and necessary in order to provide the required intensity of service to ensure the patient's safety. The patient's presenting symptoms, physical exam findings, and initial radiographic and laboratory data in the context of their medical condition is felt to place them at decreased risk for further clinical deterioration. Furthermore, it is anticipated that the patient will be medically stable for discharge from the hospital within 2 midnights of admission. The following factors support the patient status of observation.   " The patient's presenting symptoms include abnormal MRI, slurred speech. " The physical exam findings include slurred speech. " The initial radiographic and laboratory data are acute stroke and new met.    Status is: Observation  The patient remains OBS appropriate and will d/c before 2 midnights.  Dispo: The patient is from: Home              Anticipated d/c is to: Home              Patient currently is not medically stable to d/c.   Difficult to place patient No    Consultants  . neurology  Procedures  . none  Time Spent on Admission: 56 minutes    Harold Hedge, DO [facility]  11/10/2020, 4:59 PM

## 2020-11-10 NOTE — ED Provider Notes (Signed)
[facility] DEPT Provider Note   CSN: [id number] Arrival date & time: 11/10/20  1301     History Chief Complaint  Patient presents with  . Neurologic Problem    [name] is a 75 y.o. male.  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  Per notes, his follow-up in December 2021 showed no recurrence of cancer.  He was having an MRI of his brain today as he cancer follow-up and it was noted on the MRI that he had an acute infarct in the left caudate and corona radiata.  He also had a questionable new metastatic lesion in the right temporal lobe.  He was sent here for further evaluation.  He denies any symptoms.  He denies any speech or vision deficits other than he says at times his speech gets slurred but that is been going off and on for the last 2 years.  I asked if he had any new occurrences that he said maybe over the last 2 months he has noticed it sometimes.  He denies any difficulty with his balance other than his baseline difficulties.  No numbness or weakness to his extremities.  No vision changes.  No worsening headaches.  He was admitted in February for generalized weakness but no etiology was discovered.        Past Medical History:  Diagnosis Date  . Actinic keratosis   . Arthritis   . Colitis MAY 2012    CT ABD/PELVIS HEP FLEXURE  . COPD (chronic obstructive pulmonary disease) ([place])   . Diabetes mellitus without complication ([place])   . History of kidney stones   . Hyperlipemia   . Hypertension   . NSAID long-term use NAPROXEN FOR OA  . SCL Ca dx'd 08/2018    Patient Active Problem List   Diagnosis Date Noted  . Weak 09/24/2020  . Ambulatory dysfunction 09/24/2020  . Other pulmonary embolism without acute cor pulmonale ([place]) 01/19/2020  . Chronic obstructive pulmonary disease ([place]) 01/19/2020  . Type 2  diabetes mellitus without complication ([place]) 40/98/1191  . Goals of care, counseling/discussion 09/02/2018  . Brain metastasis ([place]) 08/29/2018  . Small cell carcinoma of lung, right ([place])   . 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 DNR (do not resuscitate) discussion 08/22/2018  . 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 Thoracic ascending aortic aneurysm ([place]) 08/21/2018  . Lymphadenopathy 08/21/2018  . Medial meniscus tear   . Primary osteoarthritis of knee   . Synovial plica of knee   . Colitis 01/25/2011  . Colon cancer screening 01/25/2011    Past Surgical History:  Procedure Laterality Date  . BACK SURGERY     spinal stenosis  . BASAL CELL CARCINOMA EXCISION  FACE, arms feet, leg  . CHOLECYSTECTOMY  JUNE 2011 MJ   STONES, PANCREATITIS  . KNEE ARTHROSCOPY WITH MEDIAL MENISECTOMY Right 03/24/2015   Procedure: KNEE ARTHROSCOPY WITH MEDIAL MENISECTOMY;  Surgeon: Carole Civil, MD;  Location: [facility];  Service: Orthopedics;  Laterality: Right;  . KNEE SURGERY Left LEFT   arthroscopy  . LITHOTRIPSY  80s  . PORTACATH PLACEMENT Left 09/11/2018   Procedure: INSERTION PORT-A-CATH;  Surgeon: Virl Cagey, MD;  Location: [facility];  Service: General;  Laterality: Left;  . SPINE SURGERY         Family History  Problem Relation Age of Onset  .  Cancer Mother        lung  . Cancer Father        lung and liver  . Colon polyps Neg Hx   . Colon cancer Neg Hx     Social History   Tobacco Use  . Smoking status: Current Some Day Smoker    Packs/day: 1.00    Years: 50.00    Pack years: 50.00    Types: Cigarettes    Last attempt to quit: 08/07/2018    Years since quitting: 2.2  . Smokeless tobacco: Never Used  Substance Use Topics  . Alcohol use: Yes    Comment: once a month  . Drug use: No    Home Medications Prior to Admission medications   Medication Sig Start Date End Date Taking? Authorizing Provider  albuterol (PROVENTIL  HFA;VENTOLIN HFA) 108 (90 Base) MCG/ACT inhaler Inhale 2 puffs into the lungs every 6 (six) hours as needed for wheezing or shortness of breath. 06/22/18   [provider]  aspirin 81 MG tablet Take 81 mg by mouth daily.    [provider]  Cholecalciferol 50 MCG (2000 UT) TBDP Take 1 tablet by mouth daily.    [provider]  levothyroxine (SYNTHROID) 200 MCG tablet Take 200 mcg by mouth daily. 09/03/20   [provider]  metFORMIN (GLUCOPHAGE) 500 MG tablet Take 500 mg by mouth 2 (two) times daily with a meal.    [provider]  Multiple Vitamin (MULTIVITAMIN WITH MINERALS) TABS tablet Take 1 tablet by mouth daily. 09/26/20   Johnson, Clanford L, MD  rosuvastatin (CRESTOR) 10 MG tablet Take 10 mg by mouth daily.    [provider]  thiamine 100 MG tablet Take 1 tablet (100 mg total) by mouth daily. 09/26/20   Murlean Iba, MD    Allergies    Patient has no known allergies.  Review of Systems   Review of Systems  Constitutional: Negative for chills, diaphoresis, fatigue and fever.  HENT: Negative for congestion, rhinorrhea and sneezing.   Eyes: Negative.   Respiratory: Negative for cough, chest tightness and shortness of breath.   Cardiovascular: Negative for chest pain and leg swelling.  Gastrointestinal: Negative for abdominal pain, blood in stool, diarrhea, nausea and vomiting.  Genitourinary: Negative for difficulty urinating, flank pain, frequency and hematuria.  Musculoskeletal: Negative for arthralgias and back pain.  Skin: Negative for rash.  Neurological: Positive for speech difficulty. Negative for dizziness, weakness, numbness and headaches.    Physical Exam Updated Vital Signs BP (!) 151/91 (BP Location: Left Arm)   Pulse 61   Temp 98.1 F (36.7 C) (Oral)   Resp 18   SpO2 99%   Physical Exam Constitutional:      Appearance: He is well-developed.  HENT:     Head: Normocephalic and atraumatic.  Eyes:      Pupils: Pupils are equal, round, and reactive to light.  Cardiovascular:     Rate and Rhythm: Normal rate and regular rhythm.     Heart sounds: Normal heart sounds.  Pulmonary:     Effort: Pulmonary effort is normal. No respiratory distress.     Breath sounds: Normal breath sounds. No wheezing or rales.  Chest:     Chest wall: No tenderness.  Abdominal:     General: Bowel sounds are normal.     Palpations: Abdomen is soft.     Tenderness: There is no abdominal tenderness. There is no guarding or rebound.  Musculoskeletal:  General: Normal range of motion.     Cervical back: Normal range of motion and neck supple.  Lymphadenopathy:     Cervical: No cervical adenopathy.  Skin:    General: Skin is warm and dry.     Findings: No rash.  Neurological:     Mental Status: He is alert and oriented to person, place, and time.     Comments: Motor 5/5 all extremities Sensation grossly intact to LT all extremities Finger to Nose intact, no pronator drift CN II-XII grossly intact Visual fields full to confrontation      ED Results / Procedures / Treatments   Labs (all labs ordered are listed, but only abnormal results are displayed) Labs Reviewed  CBC - Abnormal; Notable for the following components:      Result Value   RBC 4.18 (*)    All other components within normal limits  I-STAT CHEM 8, ED - Abnormal; Notable for the following components:   Potassium 3.0 (*)    Creatinine, Ser 0.60 (*)    Calcium, Ion 1.07 (*)    TCO2 20 (*)    Hemoglobin 10.2 (*)    HCT 30.0 (*)    All other components within normal limits  RESP PANEL BY RT-PCR (FLU A&B, COVID) ARPGX2  DIFFERENTIAL  ETHANOL  PROTIME-INR  APTT  COMPREHENSIVE METABOLIC PANEL  RAPID URINE DRUG SCREEN, HOSP PERFORMED  URINALYSIS, ROUTINE W REFLEX MICROSCOPIC    EKG EKG Interpretation  Date/Time:  Wednesday November 10 2020 14:01:22 EDT Ventricular Rate:  56 PR Interval:  241 QRS Duration: 103 QT  Interval:  442 QTC Calculation: 427 R Axis:   -43 Text Interpretation: Sinus or ectopic atrial rhythm Prolonged PR interval Probable left atrial enlargement Incomplete RBBB and LAFB Abnormal R-wave progression, late transition Nonspecific T abnormalities, lateral leads since last tracing no significant change Confirmed by Malvin Johns [phone]) on 11/10/2020 2:37:25 PM   Radiology MR BRAIN W WO CONTRAST  Result Date: 11/10/2020 CLINICAL DATA:  Brain/CNS neoplasm, assess treatment response. EXAM: MRI HEAD WITHOUT AND WITH CONTRAST TECHNIQUE: Multiplanar, multiecho pulse sequences of the brain and surrounding structures were obtained without and with intravenous contrast. CONTRAST:  16 mL of MultiHance IV. COMPARISON:  MRI June 16, 2020 and MRI January 15, 2020. FINDINGS: Brain: Acute infarct involving the left caudate and overlying corona radiata. No substantial mass effect. No acute hemorrhage. Previously seen punctate focus of restricted diffusion in the right para hippocampal region has resolved and there is no abnormal enhancement in this region. Left pontine infarct which does not restrict diffusion but is new since January 15, 2020. Similar advanced T2/FLAIR hyperintensity within the white matter, likely due to radiation. No hydrocephalus. Approximately 3 mm focus of cortical enhancement in the posterior right temporal lobe (series 12, image 82 and series 11, image 74), which is not present on the most recent prior contrasted study from January 15, 2020. No hydrocephalus. No midline shift. Basal cisterns are patent. Vascular: Major arterial flow voids are maintained at the skull base. Skull and upper cervical spine: Normal marrow signal. Sinuses/Orbits: Clear sinuses.  Unremarkable orbits. Other: Small left and trace right mastoid effusions. IMPRESSION: 1. Acute infarct involving the left caudate and overlying corona radiata. No substantial mass effect. 2. Approximately 3 mm focus of cortical enhancement in  the posterior right temporal lobe, concerning for a new metastasis. 3. Left pontine infarct which does not appear acute but is new since January 15, 2020. 4. Similar post-radiation changes. Acute stroke findings  discussed with Dr. Mickeal Skinner via telephone at 12:25 PM who recommended that the patient go to the ED. I conveyed this to the tech Lonn Georgia) at 12:35 PM. Electronically Signed   By: Margaretha Sheffield MD   On: 11/10/2020 13:03    Procedures Procedures   Medications Ordered in ED Medications - No data to display  ED Course  I have reviewed the triage vital signs and the nursing notes.  Pertinent labs & imaging results that were available during my care of the patient were reviewed by me and considered in my medical decision making (see chart for details).    MDM Rules/Calculators/A&P                          Patient is a 75 year old male who presents after having a routine screening MRI that found an acute infarct.  He does not currently have any neurologic deficits.  So far his labs show a slight drop in his hemoglobin from his prior values and a slightly low potassium.  I spoke with Dr. Rory Percy with neurology who recommends patient be admitted to [facility].  I spoke with Dr. Maryln Gottron who will admit the patient. Final Clinical Impression(s) / ED Diagnoses Final diagnoses:  Cerebral infarction, unspecified mechanism [facility])    Rx / [place] Orders ED Discharge Orders    None       Malvin Johns, MD 11/10/20 1517

## 2020-11-10 NOTE — ED Notes (Signed)
Speech and balance at baseline per pt

## 2020-11-10 NOTE — Consult Note (Signed)
Neurology consult   CC: incidental stroke found on MRI brain today  History is obtained from: patient and male at bedside  HPI: [name] is a 75 yo male with a PMHx of tobacco abuse, small cell lung cancer (dx 08/2018) s/p radiation and chemo (including carboplatin and durvalumab) with known brain met, COPD, HTN, HLD, DM II, 1st degree AVB recently evaluated by cardiology, and hypothyroidism. [name] was at here today for out patient MRI brain to f/up metastasis. An incidental finding of acute infarct in left caudate and corona radiata with evidence of old left pontine infarct and enhancement in posterior right temporal lobe concerning for new metastasis. Patient was sent to ED to be evaluated for stroke. He is to be transferred to [facility] for workup.   Regarding his oncologic history, he has extensive stage small cell lung cancer for which he received 4 cycles of carboplatinum, VP-16 and durvalumab 08/23/2018 through 10/28/2018 with durvalumab maintenance through 02/13/2019 stopped secondary to colitis.  He had 3 small subcentimeter metastases in the right insula, right upper lobe and right cerebellar hemisphere for which he got whole brain radiation 09/02/2018 through 09/13/2018 with subsequent MRIs (2/11 and 6/11 showing no evidence of intracranial metastases though the latter MRI showed some postradiation changes in the cerebral white matter).  On screening MRI 11/10/2020, he was found to have an acute infarct involving the left caudate and adjacent corona radiata as well as a left pontine FLAIR hyperintensity concerning for remote infarct (without diffusion restriction but new since January 15, 2020), as well as a 3 mm focus of cortical enhancement in the posterior right temporal lobe potentially concerning for new metastasis.  He was therefore referred to the ED for stroke work-up  Patient has had no dysphagia, clumsiness, gait disturbance, confusion, vision changes, HA, n/v, weakness of an extremity,  numbness or tingling, aphasia, or facial droop. He states that he has had some slurring of his words over past 2 weeks, but this comes and goes, and he says his speech today is normal. He has no personal history of stroke but is unsure of family history.   He quit smoking in 2020 after a 50 pack year history. At cardiology visit 10/28/20, patient needed no treatment for AV block and echocardiogram was ordered. Last oncology visit 10/27/20 notes lung cancer and brain metastasis with order for MRI today as above.   MRS: Patient lives with his brother. Still drives. Walks independently around home, uses a cane sometimes and a walker if he has to walk more than 30 yards. He is able to independently perform all ADLs. He takes care of his own finances.   LKW: unknown, he claims there has been nothing abnormal MRS: 1  NIHSS:  1a Level of Conscious: 0 1b LOC Questions: 0 1c LOC Commands: 0 2 Best Gaze: 0 3 Visual: 0 4 Facial Palsy: 0 5a Motor Arm - left: 0 5b Motor Arm - Right: 0 6a Motor Leg - Left: 0 6b Motor Leg - Right: 0 7 Limb Ataxia: 0 8 Sensory: 0 9 Best Language: 0 10 Dysarthria: 0 11 Extinct. and Inatten: 0 TOTAL: 0   ROS: A robust ROS was performed and is negative except as noted in the HPI.   Past Medical History:  Diagnosis Date  . Actinic keratosis   . Arthritis   . Colitis MAY 2012    CT ABD/PELVIS HEP FLEXURE  . COPD (chronic obstructive pulmonary disease) ([place])   . Diabetes mellitus without complication (  [facility])   . History of kidney stones   . Hyperlipemia   . Hypertension   . NSAID long-term use NAPROXEN FOR OA  . SCL Ca dx'd 08/2018   Past Surgical History:  Procedure Laterality Date  . BACK SURGERY     spinal stenosis  . BASAL CELL CARCINOMA EXCISION  FACE, arms feet, leg  . CHOLECYSTECTOMY  JUNE 2011 MJ   STONES, PANCREATITIS  . KNEE ARTHROSCOPY WITH MEDIAL MENISECTOMY Right 03/24/2015   Procedure: KNEE ARTHROSCOPY WITH MEDIAL MENISECTOMY;  Surgeon: Stanley E  Harrison, MD;  Location: [facility];  Service: Orthopedics;  Laterality: Right;  . KNEE SURGERY Left LEFT   arthroscopy  . LITHOTRIPSY  80s  . PORTACATH PLACEMENT Left 09/11/2018   Procedure: INSERTION PORT-A-CATH;  Surgeon: Bridges, Lindsay C, MD;  Location: [facility];  Service: General;  Laterality: Left;  . SPINE SURGERY      Family History  Problem Relation Age of Onset  . Cancer Mother        lung  . Cancer Father        lung and liver  . Colon polyps Neg Hx   . Colon cancer Neg Hx     Social History:  reports that he has been smoking cigarettes. He has a 50.00 pack-year smoking history. He has never used smokeless tobacco. He reports current alcohol use. He reports that he does not use drugs.ETOH use once a month.    Prior to Admission medications   Medication Sig Start Date End Date Taking? Authorizing Provider  albuterol (PROVENTIL HFA;VENTOLIN HFA) 108 (90 Base) MCG/ACT inhaler Inhale 2 puffs into the lungs every 6 (six) hours as needed for wheezing or shortness of breath. 06/22/18  Yes [provider]  aspirin 81 MG tablet Take 81 mg by mouth daily.   Yes [provider]  Cholecalciferol 50 MCG (2000 UT) TBDP Take 1 tablet by mouth daily.   Yes [provider]  levothyroxine (SYNTHROID) 200 MCG tablet Take 200 mcg by mouth daily. 09/03/20  Yes [provider]  Multiple Vitamin (MULTIVITAMIN WITH MINERALS) TABS tablet Take 1 tablet by mouth daily. 09/26/20  Yes Johnson, Clanford L, MD  rosuvastatin (CRESTOR) 10 MG tablet Take 10 mg by mouth daily.   Yes [provider]  thiamine 100 MG tablet Take 1 tablet (100 mg total) by mouth daily. 09/26/20  Yes Johnson, Clanford L, MD   Exam: Current vital signs: BP (!) 151/91 (BP Location: Left Arm)   Pulse 61   Temp 98.1 F (36.7 C) (Oral)   Resp 18   SpO2 99%   Physical Exam  Constitutional: Appears well-developed and well-nourished.  Psych: Affect appropriate to situation Eyes: His sclera  are pink bilaterally. No icterus.  HENT: No OP obstrucion Head: Normocephalic.  Cardiovascular: Normal rate and regular rhythm.  Respiratory: Effort normal and breath sounds normal to anterior ascultation GI: Soft.  No distension. There is no tenderness.  Skin: WDI, very dry in the bilateral shins which he states is chronic Extremities: Warm, well perfused. No edema.   Neuro: Mental Status: Patient is awake, alert, oriented to person, place, month, year, and situation. Patient is able to give a clear and coherent history. No signs of aphasia or neglect. Speech/Language: speech is without dysarthria. Speech is fluent and repetition, naming, and comprehension intact.  Cranial Nerves: II: Visual Fields are full. Pupils are equal, round, and reactive to light.  III,IV, VI: EOMI without ptosis or diploplia.  V: Facial sensation   is symmetric to light touch.  VII: Facial movement is symmetric. Able to puff cheeks and raise eyebrows.  VIII: hearing is intact to voice X: Uvula elevates symmetrically XI: Shoulder shrug is symmetric. XII: tongue is midline without atrophy or fasciculations.  Motor: Tone is normal. Bulk is normal. 5/5 strength was present in all four extremities.  He does have decrementing finger tapping in the right hand only, but otherwise fine finger movements are intact Sensory: Sensation is symmetric to light touch in arms and legs. Extinction is absent to light touch to DSS.  Plantars: Toes are downgoing bilaterally.  Cerebellar: FNF and HKS are intact bilaterally.  Gait: deferred.  I have reviewed labs in epic and the pertinent results are: INR  1.1   APTT  34 Creatinine 0.86, CMP otherwise unremarkable UA negative for infection, U tox negative TSH elevated at 44.212   MD personally reviewed the images obtained:  MRI brain with and without contrast 11/10/20:   Acute infarct involving the left caudate and overlying corona radiata. No substantial mass effect. 2.  Approximately 3 mm focus of cortical enhancement in the posterior right temporal lobe, concerning for a new metastasis. 3. Left pontine infarct which does not appear acute but is new since January 15, 2020. 4. Similar post-radiation changes.  Assessment: 75 yo male with known history of small cell lung ca and brain metastases who was having an out patient MRI brain today to f/up cancer. Incidental finding of acute infarct in left caudate and corona radiata. His history surrounding the last week in re: stroke symptoms is negative. There is last known well because he has not been abnormal per himself. His speech is normal today and states sometimes he has slurring but not much and it comes and goes. Given his known brain metastases, we will not start any antiplatelets other than his home ASA 81mg to reduce his chances of bleeding.   Stroke etiology may be small vessel disease (risk factors include prior whole brain radiation, hypertension, hyperlipidemia, diabetes, and history of smoking) given location although given the size of the stroke cardioembolic is possible.  Given malignancy with new brain metastases, hypercoagulable state in the setting of malignancy should also be considered.  Impression:  1. 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 2. Area of probable new metastasis in brain.   Plan:--all have been ordered - Medicine admit. Transfer to [facility] completed.   - CTA head and neck - TTE. - Labs: HbA1c, lipid panel, TSH, B12 and B1 ([place] on home Meds).  -free T4 since TSH resulted elevated  -B12 resulted low normal at 258, will start 1000 mcg p.o. daily B12 for goal level greater than 400   -PCP to follow-up repeat level in 1 to 2 months - On Crestor at low dose, so increase dose if LDL > 70 - Aspirin 81mg daily. - No Plavix or DAPT or AC due to metastases to brain, consider consultation with Dr. Vaslow regarding this in the morning  - SBP goal - as we do not know when this occurred,  normotensive is fine. - Telemetry monitoring for arrhythmia. - Passed swallow screen. - Carb modified/heart healthy diet.  - Stroke education. - PT/OT/SLP consult. - Recommend metabolic/infectious workup with CBC, CMP, UA with UCx,  CXR, CK, serum lactate. - patient needs f/up with oncology or have them see him here for consult.  - out patient neurology f/up 2 weeks after discharge.   Electronically signed by: Karen Kirby-Graham, MSN, APN-BC, nurse practitioner   and by MD. Note/plan to be edited by MD as needed.  Pager: [phone]  Attending Neurologist's note:  I personally saw this patient, gathering history, performing a full neurologic examination, reviewing relevant labs, personally reviewing relevant imaging including MRI brain and formulated the assessment and plan, adding the note above for completeness and clarity to accurately reflect my thoughts  Lesleigh Noe MD-PhD [facility] [phone] Available 7 PM to 7 AM, outside of these hours please call Neurologist on call as listed on Amion.

## 2020-11-10 NOTE — ED Notes (Signed)
Report called to [facility] complete  Called care link  Pt to sign emtela

## 2020-11-10 NOTE — ED Triage Notes (Signed)
Pt states follow up MRI today, was told they had seen a stroke of indeterminate age on scan. Sent for stroke workup. No current acute neurological problems. Aox4, able to answer questions appropriately. No focal deficits. No facial droop. Per family, no acute changes from pt baseline.

## 2020-11-10 NOTE — ED Notes (Signed)
Family in room - sister

## 2020-11-11 ENCOUNTER — Observation Stay (HOSPITAL_BASED_OUTPATIENT_CLINIC_OR_DEPARTMENT_OTHER): Payer: Medicare Other

## 2020-11-11 ENCOUNTER — Inpatient Hospital Stay: Payer: Medicare Other | Admitting: Internal Medicine

## 2020-11-11 ENCOUNTER — Other Ambulatory Visit: Payer: Self-pay | Admitting: Nurse Practitioner

## 2020-11-11 DIAGNOSIS — C3491 Malignant neoplasm of unspecified part of right bronchus or lung: Secondary | ICD-10-CM | POA: Diagnosis not present

## 2020-11-11 DIAGNOSIS — C7931 Secondary malignant neoplasm of brain: Secondary | ICD-10-CM

## 2020-11-11 DIAGNOSIS — I639 Cerebral infarction, unspecified: Secondary | ICD-10-CM

## 2020-11-11 DIAGNOSIS — I6389 Other cerebral infarction: Secondary | ICD-10-CM | POA: Diagnosis not present

## 2020-11-11 DIAGNOSIS — I63312 Cerebral infarction due to thrombosis of left middle cerebral artery: Secondary | ICD-10-CM

## 2020-11-11 LAB — ECHOCARDIOGRAM COMPLETE
AR max vel: 1.5 cm2
AV Area VTI: 1.64 cm2
AV Area mean vel: 1.41 cm2
AV Mean grad: 9 mmHg
AV Peak grad: 13.7 mmHg
Ao pk vel: 1.85 m/s
Area-P 1/2: 1.61 cm2
P 1/2 time: 1035 msec
S' Lateral: 3.5 cm

## 2020-11-11 LAB — CBC
HCT: 38.6 % — ABNORMAL LOW (ref 39.0–52.0)
Hemoglobin: 13 g/dL (ref 13.0–17.0)
MCH: 32.9 pg (ref 26.0–34.0)
MCHC: 33.7 g/dL (ref 30.0–36.0)
MCV: 97.7 fL (ref 80.0–100.0)
Platelets: 216 10*3/uL (ref 150–400)
RBC: 3.95 MIL/uL — ABNORMAL LOW (ref 4.22–5.81)
RDW: 14.7 % (ref 11.5–15.5)
WBC: 5.5 10*3/uL (ref 4.0–10.5)
nRBC: 0 % (ref 0.0–0.2)

## 2020-11-11 LAB — LIPID PANEL
Cholesterol: 161 mg/dL (ref 0–200)
HDL: 38 mg/dL — ABNORMAL LOW (ref >40)
LDL Cholesterol: 107 mg/dL — ABNORMAL HIGH (ref 0–99)
Total CHOL/HDL Ratio: 4.2 RATIO
Triglycerides: 78 mg/dL (ref <150)
VLDL: 16 mg/dL (ref 0–40)

## 2020-11-11 LAB — BASIC METABOLIC PANEL
Anion gap: 5 (ref 5–15)
BUN: 13 mg/dL (ref 8–23)
CO2: 30 mmol/L (ref 22–32)
Calcium: 9.2 mg/dL (ref 8.9–10.3)
Chloride: 103 mmol/L (ref 98–111)
Creatinine, Ser: 1.07 mg/dL (ref 0.61–1.24)
GFR, Estimated: >60 mL/min (ref >60)
Glucose, Bld: 98 mg/dL (ref 70–99)
Potassium: 4 mmol/L (ref 3.5–5.1)
Sodium: 138 mmol/L (ref 135–145)

## 2020-11-11 LAB — HEMOGLOBIN A1C
Hgb A1c MFr Bld: 5.4 % (ref 4.8–5.6)
Mean Plasma Glucose: 108.28 mg/dL

## 2020-11-11 LAB — T4, FREE: Free T4: 0.6 ng/dL — ABNORMAL LOW (ref 0.61–1.12)

## 2020-11-11 MED ORDER — ROSUVASTATIN CALCIUM 20 MG PO TABS: 20.0000 mg | ORAL_TABLET | Freq: Every day | ORAL | 0 refills | Status: DC

## 2020-11-11 MED ORDER — ASPIRIN 81 MG PO TABS: 81.0000 mg | ORAL_TABLET | Freq: Every day | ORAL | Status: AC

## 2020-11-11 MED ORDER — CLOPIDOGREL BISULFATE 75 MG PO TABS: 75.0000 mg | ORAL_TABLET | Freq: Every day | ORAL | 1 refills | Status: DC

## 2020-11-11 MED ORDER — CYANOCOBALAMIN 1000 MCG PO TABS: 1000.0000 ug | ORAL_TABLET | Freq: Every day | ORAL | 1 refills | Status: DC

## 2020-11-11 MED ORDER — LEVOTHYROXINE SODIUM 200 MCG PO TABS: 200.0000 ug | ORAL_TABLET | Freq: Every day | ORAL | 0 refills | Status: DC

## 2020-11-11 MED ORDER — ROSUVASTATIN CALCIUM 20 MG PO TABS
20.0000 mg | ORAL_TABLET | Freq: Every day | ORAL | Status: DC
  Administered 2020-11-11: 20 mg via ORAL
  Filled 2020-11-11: qty 1

## 2020-11-11 MED ORDER — CLOPIDOGREL BISULFATE 75 MG PO TABS
75.0000 mg | ORAL_TABLET | Freq: Every day | ORAL | Status: DC
  Administered 2020-11-11: 75 mg via ORAL
  Filled 2020-11-11: qty 1

## 2020-11-11 NOTE — TOC Initial Note (Signed)
Transition of Care [facility]) - Initial/Assessment Note    Patient Details  Name: [name] MRN: [id number] Date of Birth: [birth year]  Transition of [facility] CM/SW Contact:    Pollie Friar, RN Phone Number: 11/11/2020, 11:39 AM  Clinical Narrative:                 Patient is from home with his brother that works the night shift. Pt denies any issues with home medications or transportation. Pt is active with [facility] for [facility] services. CM has updated Malachy Mood with [facility] of patients admission and resumption orders.  Pt is also active with [facility] for palliative care.  Pt has transport home when medically ready.  Expected Discharge Plan: [place] Barriers to Discharge: Continued Medical Work up   Patient Goals and CMS Choice   CMS Medicare.gov Compare Post Acute Care list provided to:: Patient Choice offered to / list presented to : [place]  Expected Discharge Plan and Services Expected Discharge Plan: [place]   Discharge Planning Services: CM Consult Post Acute Care Choice: [place] arrangements for the past 2 months: [place]: PT,OT [place] Agency: [place] Date [place]: 11/11/20   Representative spoke with at [place]: Malachy Mood  Prior Living Arrangements/Services Living arrangements for the past 2 months: [place] Lives with:: Siblings Patient language and need for interpreter reviewed:: Yes Do you feel safe going back to the place where you live?: Yes          Current home services: DME (rollator/ walker/ cane/ 3 in 1) Criminal Activity/Legal Involvement Pertinent to Current Situation/Hospitalization: No - Comment as needed  Activities of Daily Living Home Assistive Devices/Equipment: Cane (specify quad or straight),Walker (specify type),Bedside commode/3-in-1 (rolator) ADL Screening (condition at time of  admission) Patient's cognitive ability adequate to safely complete daily activities?: Yes Is the patient deaf or have difficulty hearing?: No Does the patient have difficulty seeing, even when wearing glasses/contacts?: No Does the patient have difficulty concentrating, remembering, or making decisions?: Yes Patient able to express need for assistance with ADLs?: Yes Does the patient have difficulty dressing or bathing?: No Independently performs ADLs?: Yes (appropriate for developmental age) Communication: Independent Dressing (OT): Independent Grooming: Independent Feeding: Independent Bathing: Independent Toileting: Independent with device (comment) (cane, rolator) In/Out Bed: Independent with device (comment) (cane, rolator) Walks in Home: Independent with device (comment) (cane, rolator) Does the patient have difficulty walking or climbing stairs?: Yes Weakness of Legs: None Weakness of Arms/Hands: None  Permission Sought/Granted                  Emotional Assessment Appearance:: Appears stated age Attitude/Demeanor/Rapport: Engaged Affect (typically observed): Accepting Orientation: : Oriented to Self,Oriented to Place,Oriented to  Time,Oriented to Situation   Psych Involvement: No (comment)  Admission diagnosis:  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 Weak 09/24/2020  . Ambulatory dysfunction 09/24/2020  . Other pulmonary embolism without acute cor pulmonale ([place]) 01/19/2020  . Chronic obstructive pulmonary disease ([place]) 01/19/2020  . Type 2 diabetes mellitus without complication ([place]) 57/84/6962  . Goals of care, counseling/discussion 09/02/2018  . Brain metastasis ([place]) 08/29/2018  . Small cell carcinoma  of lung, right ([place])   . 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 DNR (do not resuscitate) discussion 08/22/2018  . 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 Thoracic ascending aortic aneurysm ([place]) 08/21/2018  . Lymphadenopathy 08/21/2018  . Medial meniscus tear   . Primary osteoarthritis of knee   . Synovial plica of knee   . Colitis 01/25/2011  . Colon cancer screening 01/25/2011   PCP:  Lucia Gaskins, MD Pharmacy:   [facility], [address] AT [address] [place] [address] Phone: [phone] Fax: [phone]     Social Determinants of Health (SDOH) Interventions    Readmission Risk Interventions No flowsheet data found.

## 2020-11-11 NOTE — Progress Notes (Addendum)
STROKE TEAM PROGRESS NOTE   INTERVAL HISTORY His wife is at the bedside.  On exam this morning patient reports he is aware that there was an incidental finding of stroke on his routine MRI. Patient reports that he has not had any recent episodes of focal weakness, aphasia, visual disturbance, or headache that he can recall. Patient wife also agrees that the patient has appeared at his baseline for the past week. Patient denies any discomfort at the time of exam. Patient does report that he continues to smoke.  Vitals:   11/10/20 1941 11/10/20 2350 11/11/20 0322 11/11/20 0742  BP: (!) 171/100 128/82 130/70 (!) 147/85  Pulse: (!) 55 (!) 55 (!) 50 (!) 50  Resp: 19 17 17 20   Temp: (!) 97.4 F (36.3 C) (!) 97.5 F (36.4 C) 98 F (36.7 C) 97.7 F (36.5 C)  TempSrc: Oral Oral Oral Oral  SpO2: 100% 94% 99% 96%   CBC:  Recent Labs  Lab 11/10/20 1425 11/10/20 1446 11/10/20 1835 11/11/20 0249  WBC 7.0  --   --  5.5  NEUTROABS 4.8  --   --   --   HGB 13.3   < > 13.3 13.0  HCT 41.3   < > 39.9 38.6*  MCV 98.8  --   --  97.7  PLT 235  --   --  216   < > = values in this interval not displayed.   Basic Metabolic Panel:  Recent Labs  Lab 11/10/20 1425 11/10/20 1446 11/11/20 0249  NA 143 144 138  K 3.8 3.0* 4.0  CL 109 110 103  CO2 27  --  30  GLUCOSE 91 71 98  BUN 16 11 13   CREATININE 0.86 0.60* 1.07  CALCIUM 9.4  --  9.2   Lipid Panel:  Recent Labs  Lab 11/11/20 0249  CHOL 161  TRIG 78  HDL 38*  CHOLHDL 4.2  VLDL 16  LDLCALC 107*   HgbA1c:  Recent Labs  Lab 11/11/20 0249  HGBA1C 5.4   Urine Drug Screen:  Recent Labs  Lab 11/10/20 1425  LABOPIA NONE DETECTED  COCAINSCRNUR NONE DETECTED  LABBENZ NONE DETECTED  AMPHETMU NONE DETECTED  THCU NONE DETECTED  LABBARB NONE DETECTED    Alcohol Level  Recent Labs  Lab 11/10/20 1425  ETH <10    IMAGING past 24 hours CT ANGIO HEAD W OR WO CONTRAST  Result Date: 11/10/2020 CLINICAL DATA:  Follow-up examination  for acute stroke. History of metastatic lung cancer. EXAM: CT ANGIOGRAPHY HEAD AND NECK TECHNIQUE: Multidetector CT imaging of the head and neck was performed using the standard protocol during bolus administration of intravenous contrast. Multiplanar CT image reconstructions and MIPs were obtained to evaluate the vascular anatomy. Carotid stenosis measurements (when applicable) are obtained utilizing NASCET criteria, using the distal internal carotid diameter as the denominator. CONTRAST:  57mL OMNIPAQUE IOHEXOL 350 MG/ML SOLN COMPARISON:  Prior MRI from earlier the same day. FINDINGS: CT HEAD FINDINGS Brain: Generalized age-related cerebral atrophy. Extensive cerebral white matter disease, most likely reflecting post radiation changes. Chronic appearing lacunar infarct at the left pons. Evolving acute left basal ganglia infarct involving the left caudate an adjacent internal capsule, stable from previous MRI. No evidence for hemorrhagic transformation, mass effect, or other complication. No other acute large vessel territory infarct. No intracranial hemorrhage. Previously noted punctate focus of enhancement involving the right temporal cortex not well seen by CT. No other mass lesion, mass effect, or midline shift. No hydrocephalus  or extra-axial fluid collection. Vascular: No hyperdense vessel. Extensive calcified atherosclerosis at the skull base. Skull: Scalp soft tissues within normal limits. Calvarium intact. No discrete osseous lesions. Sinuses: Paranasal sinuses and mastoid air cells are largely clear. Orbits: Globes and orbital soft tissues within normal limits. Review of the MIP images confirms the above findings CTA NECK FINDINGS Aortic arch: Visualized aortic arch normal in caliber with normal branch pattern. Moderate atheromatous change about the arch itself. No hemodynamically significant stenosis about the origin of the great vessels. Right carotid system: Right CCA patent from its origin to the  bifurcation without stenosis. Mild mixed plaque about the right carotid bulb without significant stenosis. Right ICA widely patent distally without stenosis, dissection or occlusion. Left carotid system: Left CCA patent from its origin to the bifurcation without stenosis. Eccentric calcified plaque at the left carotid bulb/proximal left ICA without significant stenosis. Left ICA patent distally without stenosis, dissection or occlusion. Vertebral arteries: Left vertebral artery arises from the aortic arch, with the right vertebral artery arising from the right subclavian artery. Both vertebral arteries widely patent within the neck without stenosis, dissection or occlusion. Skeleton: No visible acute osseous finding. No discrete or worrisome osseous lesions. Degenerative osteoarthritic changes noted about the C1-2 articulation without high-grade stenosis. Other neck: No other acute soft tissue abnormality within the neck. No mass or adenopathy. Upper chest: Extensive post treatment changes noted within the partially visualized lungs. Visualized upper chest demonstrates no other definite acute abnormality. Left-sided Port-A-Cath in place. Review of the MIP images confirms the above findings CTA HEAD FINDINGS Anterior circulation: Petrous segments widely patent. Scattered atheromatous plaque within the carotid siphons with associated mild multifocal stenosis. A1 segments patent bilaterally. Predominant azygos ACA widely patent to its distal aspect. A small mall hypoplastic left A2 segment noted as well. No M1 stenosis or occlusion. Normal MCA bifurcations. Distal MCA branches well perfused and symmetric. Posterior circulation: Multifocal atheromatous change within the V4 segments bilaterally with associated mild to moderate multifocal stenoses. Both PICA origins patent and normal. Basilar patent to its distal aspect without stenosis. Superior cerebral arteries patent bilaterally. Right PCA supplied via the basilar.  Left PCA supplied via the basilar as well as a prominent left posterior communicating artery. Both PCAs well perfused to their distal aspects without stenosis. Venous sinuses: Not well assessed due to timing the contrast bolus. Anatomic variants: Predominant azygos ACA. Review of the MIP images confirms the above findings IMPRESSION: CT HEAD IMPRESSION: 1. Stable evolving acute ischemic left basal ganglia infarct, better evaluated on prior brain MRI from same day. No associated hemorrhage or other complication. 2. No other new acute intracranial abnormality. Previously noted probable small right temporal metastatic lesion not well visualized by CT. 3. Chronic left pontine infarct. 4. Advanced post treatment changes throughout the cerebral white matter. CTA HEAD AND NECK IMPRESSION: 1. Negative CTA for large vessel occlusion. 2. Mild atheromatous change about the carotid bifurcations and carotid siphons without hemodynamically significant stenosis. 3. Atheromatous change about the V4 segments bilaterally with associated mild to moderate multifocal stenoses. 4. Azygos ACA. 5. Extensive post treatment changes within the partially visualized lungs. Electronically Signed   By: Jeannine Boga M.D.   On: 11/10/2020 22:24   CT ANGIO NECK W OR WO CONTRAST  Result Date: 11/10/2020 CLINICAL DATA:  Follow-up examination for acute stroke. History of metastatic lung cancer. EXAM: CT ANGIOGRAPHY HEAD AND NECK TECHNIQUE: Multidetector CT imaging of the head and neck was performed using the standard protocol during bolus  administration of intravenous contrast. Multiplanar CT image reconstructions and MIPs were obtained to evaluate the vascular anatomy. Carotid stenosis measurements (when applicable) are obtained utilizing NASCET criteria, using the distal internal carotid diameter as the denominator. CONTRAST:  54mL OMNIPAQUE IOHEXOL 350 MG/ML SOLN COMPARISON:  Prior MRI from earlier the same day. FINDINGS: CT HEAD FINDINGS  Brain: Generalized age-related cerebral atrophy. Extensive cerebral white matter disease, most likely reflecting post radiation changes. Chronic appearing lacunar infarct at the left pons. Evolving acute left basal ganglia infarct involving the left caudate an adjacent internal capsule, stable from previous MRI. No evidence for hemorrhagic transformation, mass effect, or other complication. No other acute large vessel territory infarct. No intracranial hemorrhage. Previously noted punctate focus of enhancement involving the right temporal cortex not well seen by CT. No other mass lesion, mass effect, or midline shift. No hydrocephalus or extra-axial fluid collection. Vascular: No hyperdense vessel. Extensive calcified atherosclerosis at the skull base. Skull: Scalp soft tissues within normal limits. Calvarium intact. No discrete osseous lesions. Sinuses: Paranasal sinuses and mastoid air cells are largely clear. Orbits: Globes and orbital soft tissues within normal limits. Review of the MIP images confirms the above findings CTA NECK FINDINGS Aortic arch: Visualized aortic arch normal in caliber with normal branch pattern. Moderate atheromatous change about the arch itself. No hemodynamically significant stenosis about the origin of the great vessels. Right carotid system: Right CCA patent from its origin to the bifurcation without stenosis. Mild mixed plaque about the right carotid bulb without significant stenosis. Right ICA widely patent distally without stenosis, dissection or occlusion. Left carotid system: Left CCA patent from its origin to the bifurcation without stenosis. Eccentric calcified plaque at the left carotid bulb/proximal left ICA without significant stenosis. Left ICA patent distally without stenosis, dissection or occlusion. Vertebral arteries: Left vertebral artery arises from the aortic arch, with the right vertebral artery arising from the right subclavian artery. Both vertebral arteries  widely patent within the neck without stenosis, dissection or occlusion. Skeleton: No visible acute osseous finding. No discrete or worrisome osseous lesions. Degenerative osteoarthritic changes noted about the C1-2 articulation without high-grade stenosis. Other neck: No other acute soft tissue abnormality within the neck. No mass or adenopathy. Upper chest: Extensive post treatment changes noted within the partially visualized lungs. Visualized upper chest demonstrates no other definite acute abnormality. Left-sided Port-A-Cath in place. Review of the MIP images confirms the above findings CTA HEAD FINDINGS Anterior circulation: Petrous segments widely patent. Scattered atheromatous plaque within the carotid siphons with associated mild multifocal stenosis. A1 segments patent bilaterally. Predominant azygos ACA widely patent to its distal aspect. A small mall hypoplastic left A2 segment noted as well. No M1 stenosis or occlusion. Normal MCA bifurcations. Distal MCA branches well perfused and symmetric. Posterior circulation: Multifocal atheromatous change within the V4 segments bilaterally with associated mild to moderate multifocal stenoses. Both PICA origins patent and normal. Basilar patent to its distal aspect without stenosis. Superior cerebral arteries patent bilaterally. Right PCA supplied via the basilar. Left PCA supplied via the basilar as well as a prominent left posterior communicating artery. Both PCAs well perfused to their distal aspects without stenosis. Venous sinuses: Not well assessed due to timing the contrast bolus. Anatomic variants: Predominant azygos ACA. Review of the MIP images confirms the above findings IMPRESSION: CT HEAD IMPRESSION: 1. Stable evolving acute ischemic left basal ganglia infarct, better evaluated on prior brain MRI from same day. No associated hemorrhage or other complication. 2. No other new acute intracranial abnormality.  Previously noted probable small right temporal  metastatic lesion not well visualized by CT. 3. Chronic left pontine infarct. 4. Advanced post treatment changes throughout the cerebral white matter. CTA HEAD AND NECK IMPRESSION: 1. Negative CTA for large vessel occlusion. 2. Mild atheromatous change about the carotid bifurcations and carotid siphons without hemodynamically significant stenosis. 3. Atheromatous change about the V4 segments bilaterally with associated mild to moderate multifocal stenoses. 4. Azygos ACA. 5. Extensive post treatment changes within the partially visualized lungs. Electronically Signed   By: Jeannine Boga M.D.   On: 11/10/2020 22:24   MR BRAIN W WO CONTRAST  Result Date: 11/10/2020 CLINICAL DATA:  Brain/CNS neoplasm, assess treatment response. EXAM: MRI HEAD WITHOUT AND WITH CONTRAST TECHNIQUE: Multiplanar, multiecho pulse sequences of the brain and surrounding structures were obtained without and with intravenous contrast. CONTRAST:  16 mL of MultiHance IV. COMPARISON:  MRI June 16, 2020 and MRI January 15, 2020. FINDINGS: Brain: Acute infarct involving the left caudate and overlying corona radiata. No substantial mass effect. No acute hemorrhage. Previously seen punctate focus of restricted diffusion in the right para hippocampal region has resolved and there is no abnormal enhancement in this region. Left pontine infarct which does not restrict diffusion but is new since January 15, 2020. Similar advanced T2/FLAIR hyperintensity within the white matter, likely due to radiation. No hydrocephalus. Approximately 3 mm focus of cortical enhancement in the posterior right temporal lobe (series 12, image 82 and series 11, image 74), which is not present on the most recent prior contrasted study from January 15, 2020. No hydrocephalus. No midline shift. Basal cisterns are patent. Vascular: Major arterial flow voids are maintained at the skull base. Skull and upper cervical spine: Normal marrow signal. Sinuses/Orbits: Clear sinuses.   Unremarkable orbits. Other: Small left and trace right mastoid effusions. IMPRESSION: 1. Acute infarct involving the left caudate and overlying corona radiata. No substantial mass effect. 2. Approximately 3 mm focus of cortical enhancement in the posterior right temporal lobe, concerning for a new metastasis. 3. Left pontine infarct which does not appear acute but is new since January 15, 2020. 4. Similar post-radiation changes. Acute stroke findings discussed with Dr. Mickeal Skinner via telephone at 12:25 PM who recommended that the patient go to the ED. I conveyed this to the tech Lonn [place]) at 12:35 PM. Electronically Signed   By: Margaretha Sheffield MD   On: 11/10/2020 13:03   VAS [place] LOWER EXTREMITY VENOUS (DVT)  Result Date: 11/11/2020  Lower Venous DVT Study Indications: Stroke.  Comparison Study: 08/22/2018- negative bilateral lower extremity venous duplex Performing Technologist: Maudry Mayhew MHA, RDMS, RVT, RDCS  Examination Guidelines: A complete evaluation includes B-mode imaging, spectral Doppler, color Doppler, and power Doppler as needed of all accessible portions of each vessel. Bilateral testing is considered an integral part of a complete examination. Limited examinations for reoccurring indications may be performed as noted. The reflux portion of the exam is performed with the patient in reverse Trendelenburg.  +---------+---------------+---------+-----------+----------+--------------+ RIGHT    CompressibilityPhasicitySpontaneityPropertiesThrombus Aging +---------+---------------+---------+-----------+----------+--------------+ CFV      Full           Yes      Yes                                 +---------+---------------+---------+-----------+----------+--------------+ SFJ      Full                                                        +---------+---------------+---------+-----------+----------+--------------+  FV Prox  Full                                                         +---------+---------------+---------+-----------+----------+--------------+ FV Mid   Full                                                        +---------+---------------+---------+-----------+----------+--------------+ FV DistalFull                                                        +---------+---------------+---------+-----------+----------+--------------+ PFV      Full                                                        +---------+---------------+---------+-----------+----------+--------------+ POP      Full           Yes      Yes                                 +---------+---------------+---------+-----------+----------+--------------+ PTV      Full                                                        +---------+---------------+---------+-----------+----------+--------------+ PERO     Full                                                        +---------+---------------+---------+-----------+----------+--------------+   +---------+---------------+---------+-----------+----------+--------------+ LEFT     CompressibilityPhasicitySpontaneityPropertiesThrombus Aging +---------+---------------+---------+-----------+----------+--------------+ CFV      Full           Yes      Yes                                 +---------+---------------+---------+-----------+----------+--------------+ SFJ      Full                                                        +---------+---------------+---------+-----------+----------+--------------+ FV Prox  Full                                                        +---------+---------------+---------+-----------+----------+--------------+  FV Mid   Full                                                        +---------+---------------+---------+-----------+----------+--------------+ FV DistalFull                                                         +---------+---------------+---------+-----------+----------+--------------+ PFV      Full                                                        +---------+---------------+---------+-----------+----------+--------------+ POP      Full           Yes      Yes                                 +---------+---------------+---------+-----------+----------+--------------+ PTV      Full                                                        +---------+---------------+---------+-----------+----------+--------------+ PERO     Full                                                        +---------+---------------+---------+-----------+----------+--------------+     Summary: RIGHT: - There is no evidence of deep vein thrombosis in the lower extremity.  - No cystic structure found in the popliteal fossa.  LEFT: - There is no evidence of deep vein thrombosis in the lower extremity.  - No cystic structure found in the popliteal fossa.  *See table(s) above for measurements and observations.    Preliminary     PHYSICAL EXAM Constitutional: Appears well-developed and well-nourished.  Psych: Affect appropriate to situation Eyes: His sclera are pink bilaterally. No icterus.  HENT: No OP obstrucion Head: Normocephalic.  Cardiovascular: Normal rate and regular rhythm.  Respiratory: Effort normal and breath sounds normal to anterior ascultation Skin: WDI, very dry in the bilateral shins which he states is chronic Extremities: Warm, well perfused. No edema.   Neuro: Mental Status: Patient is awake, alert, oriented to person, place, month, year, and situation. Patient is able to give a clear and coherent history. No signs of aphasia or neglect. Speech/Language: speech is without dysarthria. Speech is fluent and repetition, naming, and comprehension intact.  Cranial Nerves: II: Visual Fields are full. Pupils are equal, round, and reactive to light.  III,IV, VI: EOMI without ptosis or diploplia.   V: Facial sensation is symmetric to light touch.  VII: Facial movement is symmetric. Able to puff cheeks and raise eyebrows.  VIII: hearing is intact to  voice X: Uvula elevates symmetrically XI: Shoulder shrug is symmetric. XII: tongue is midline without atrophy or fasciculations.  Motor: Tone is normal. Bulk is normal. 5/5 strength was present in all four extremities.   Sensory: Sensation is symmetric to light touch in arms and legs. Extinction is absent to light touch. Plantars: Toes are downgoing bilaterally.  Cerebellar: FTN intact bilaterally.  Gait: deferred.   ASSESSMENT/PLAN [name] is a 75 y.o. 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 Incidental finding of acute infarct in left caudate and corona radiata was noted and patient was admitted. Patient has multiple risk factors for stroke including hypercoagulability 2/2 to cancer, metastatis to the brain, current smoker, HLD, and T2DM. Patient assessed for increased risk for hypercoagulation with DVT, which was negative suggesting that hypercoagulation is not severe enough to have caused stroke. CMP was also negative for transaminitis. Patient neurological exam is stable with no deficit.  Will continue patient current regimen of ASA and Plavix at this time.   Stroke - acute infarct of L caudate and corona radiata, likely small vessel disease  CT- acute infarct of L basal ganglia, chronic L pontine infarct  CTA head and neck unremarkable  MRI showed left BG and CR no infarct, old pontine infarct new since 01/2020, right temporal lobe punctate new enhancement concerning for mets  VAS [place] LE negative for DVT  2D Echo EF 55 to 60%  LDL 107  HgbA1c 5.4  UDS negative  aspirin 81 mg daily prior to admission, now on aspirin 81 mg daily and clopidogrel 75 mg daily DAPT for 3 weeks and then Plavix alone.    Therapy  recommendations: Home health PT/OT  Disposition:  Home  Lung cancer with brain metastasis  Diagnosed 08/2019, status post chemo and radiation  Known brain metastasis  New right temporal lobe punctate metastasis found on current MRI  LE venous Doppler no DVT  Continue follow-up with Dr. Mickeal Skinner  Hyperlipidemia  Home meds:  Crestor 10mg ,  Increased to 20mg  Crestor due to elev LDL  LDL 107, goal < 70  Continue statin at discharge  Diabetes type II    Home meds none  A1c 5.4, controlled  CBG  SSI  Outpatient follow-up  Tobacco abuse  Current smoker  Smoking cessation counseling provided  Pt is willing to quit  Other Stroke Risk Factors  Advanced Age >/= 33   Hx stroke noted by chronic pontine infarct seen on MRI   Other Active Problems  Hypothyroidism, on Synthroid, elevated TSH and low FT4  Hospital day # 0  Damita Dunnings, MD PGY-1  ATTENDING NOTE: I reviewed above note and agree with the assessment and plan. Pt was seen and examined.   75 year old male with history of small cell lung cancer status post chemo and radiation with known brain metastasis, current smoker, diabetes, hypertension, hyperlipidemia admitted for abnormal MRI finding with left BG and CR small infarct.  CT involving acute left BG infarct, CT head and neck unremarkable.  EF 55 to 60%, LE venous Doppler no DVT.  A1c 5.4, LDL 107, UDS negative.  Elevated TSH and FT4.  On exam, patient lethargic but awake alert, orientated x3, follows simple commands, no aphasia, neurologically intact.  No focal deficits.  Etiology for patient stroke most likely due to small vessel disease given location and risk factors.  No DVT to indicate hypercoagulable state  at this time.  Recommend to continue aspirin 81 and Plavix 75 DAPT for 3 weeks and then Plavix alone.  Increase Crestor from 10-20 for stroke prevention.  Continue stroke risk factor modification.  Will follow up in clinic.  Patient has lung  cancer with known brain metastasis, current MRI also showed 1 punctate right temporal lobe enhancement concerning for new metastasis.  Asymptomatic, recommend to continue follow-up with Dr. Mickeal Skinner oncology.  For detailed assessment and plan, please refer to above as I have made changes wherever appropriate.   Neurology will sign off. Please call with questions. Pt will follow up with stroke clinic NP at [facility] in about 4 weeks. Thanks for the consult.   Rosalin Hawking, MD PhD Stroke Neurology 11/11/2020 7:03 PM     To contact Stroke Continuity provider, please refer to [email or web address] After hours, contact General Neurology

## 2020-11-11 NOTE — Evaluation (Signed)
Occupational Therapy Evaluation Patient Details Name: [name] MRN: [id number] DOB: Apr 05, 1946 Today's Date: 11/11/2020    History of Present Illness Pt is a 75 y/o male with PMH of HTN, DM COPD, small cell lung CA s/p chemo and XRT presenting to ED with abnormal MRI brain today.  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 Pt reports slurred speech for 2 weeks intermittently, but no changes over the past 24-48 hrs.   Clinical Impression   PTA patient reports living with his brother, but completing all ADLs, IADLs (including med mgmt and driving) with independence, using cane vs rollator for mobility.  Endorses several falls in the last few months. Patient was admitted for above and limited by problem list below, including impaired cognition, generalized weakness, decreased activity tolerance and impaired balance. He requires up to min assist for ADLs, min guard for in room mobility and transfers, noted poor safety awareness and decreased problem solving, recall during session.  Short blessed test reveals deficits in STM (pt reports baseline for 2 years)- questionable impairment and recommend further functional testing using pill box test. Believe patient will benefit from further OT services acutely to optimize safety and independence with ADLs, IADLs, and to resume HHOT services at dc.     Follow Up Recommendations  Home health OT;Supervision/Assistance - 24 hour    Equipment Recommendations  3 in 1 bedside commode    Recommendations for Other Services       Precautions / Restrictions Precautions Precautions: Fall Precaution Comments: multiple falls in the past 2 months Restrictions Weight Bearing Restrictions: No      Mobility Bed Mobility Overal bed mobility: Needs Assistance Bed Mobility: Supine to Sit;Sit to Supine     Supine to sit: Supervision;HOB elevated Sit to supine: Supervision;HOB  elevated   General bed mobility comments: for safety, no physical assist required    Transfers Overall transfer level: Needs assistance Equipment used: Rolling walker (2 wheeled) Transfers: Sit to/from Stand Sit to Stand: Min guard         General transfer comment: min guard to steady from EOB, cueing for hand placement and safety using RW    Balance Overall balance assessment: Needs assistance;History of Falls Sitting-balance support: No upper extremity supported Sitting balance-Leahy Scale: Fair     Standing balance support: Bilateral upper extremity supported;No upper extremity supported;During functional activity Standing balance-Leahy Scale: Poor Standing balance comment: relies on external support and reaches for UE support when not using RW                 Standardized Balance Assessment Standardized Balance Assessment : Dynamic Gait Index   Dynamic Gait Index Level Surface: Mild Impairment Change in Gait Speed: Mild Impairment Gait with Horizontal Head Turns: Mild Impairment Gait with Vertical Head Turns: Mild Impairment Gait and Pivot Turn: Moderate Impairment Step Over Obstacle: Moderate Impairment Step Around Obstacles: Moderate Impairment Steps: Moderate Impairment Total Score: 12     ADL either performed or assessed with clinical judgement   ADL Overall ADL's : Needs assistance/impaired     Grooming: Min guard;Standing   Upper Body Bathing: Set up;Sitting   Lower Body Bathing: Min guard;Sit to/from stand   Upper Body Dressing : Set up;Sitting   Lower Body Dressing: Minimal assistance;Sit to/from stand   Toilet Transfer: Min guard;Ambulation Toilet Transfer Details (indicate cue type and reason): initated with RW but leaves RW at sink and reaches for UE  support to reach restroom Toileting- Water quality scientist and Hygiene: Min guard Toileting - Clothing Manipulation Details (indicate cue type and reason): sway with standing at commode      Functional mobility during ADLs: Min guard;Rolling walker;Cueing for safety General ADL Comments: pt limited by balance, weakness, cognition     Vision Baseline Vision/History: No visual deficits Patient Visual Report: No change from baseline Vision Assessment?: No apparent visual deficits     Perception     Praxis      Pertinent Vitals/Pain Pain Assessment: Faces Faces Pain Scale: No hurt Pain Intervention(s): Monitored during session     Hand Dominance Right   Extremity/Trunk Assessment Upper Extremity Assessment Upper Extremity Assessment: Generalized weakness   Lower Extremity Assessment Lower Extremity Assessment: Defer to PT evaluation   Cervical / Trunk Assessment Cervical / Trunk Assessment: Kyphotic   Communication Communication Communication: No difficulties   Cognition Arousal/Alertness: Awake/alert Behavior During Therapy: Flat affect Overall Cognitive Status: Impaired/Different from baseline Area of Impairment: Problem solving;Following commands;Safety/judgement;Memory                     Memory: Decreased short-term memory Following Commands: Follows multi-step commands inconsistently;Follows one step commands with increased time Safety/Judgement: Decreased awareness of safety;Decreased awareness of deficits   Problem Solving: Slow processing;Requires verbal cues;Requires tactile cues General Comments: Pt completed short blessed test with deficits seen in memory, reports challenges for the past 2 years but not using compensaotry techniques. (8/28- questionable impairment).  Patient also presents with difficulty following muiltple step commands, decreased safety awarness and slow processing.   General Comments  sister present during session, reports pt recieving HH OT/PT 1x per week at home    Exercises     Shoulder Elizabeth expects to be discharged to:: Private residence Living Arrangements: Other  relatives (brother) Available Help at Discharge: Family;Available PRN/intermittently Type of Home: House Home Access: Stairs to enter [employer] of Steps: 3 Entrance Stairs-Rails: Can reach both Home Layout: One level     Bathroom Shower/Tub: Occupational psychologist: Standard     Home Equipment: Environmental consultant - 4 wheels;Cane - single point          Prior Functioning/Environment Level of Independence: Independent with assistive device(s)        Comments: uses rollator for mobilty > 50 yards, otherwise using cane; + driving, independent IADLs and medication mgmt        OT Problem List: Decreased strength;Decreased activity tolerance;Impaired balance (sitting and/or standing);Decreased safety awareness;Decreased cognition;Decreased knowledge of use of DME or AE;Decreased knowledge of precautions      OT Treatment/Interventions: Self-care/ADL training;DME and/or AE instruction;Therapeutic activities;Patient/family education;Balance training;Cognitive remediation/compensation    OT Goals(Current goals can be found in the care plan section) Acute Rehab OT Goals Patient Stated Goal: go home OT Goal Formulation: With patient Time For Goal Achievement: 11/25/20 Potential to Achieve Goals: Good  OT Frequency: Min 2X/week   Barriers to D/C:            Co-evaluation              AM-PAC OT "6 Clicks" Daily Activity     Outcome Measure Help from another person eating meals?: None Help from another person taking care of personal grooming?: A Little Help from another person toileting, which includes using toliet, bedpan, or urinal?: A Little Help from another person bathing (including washing, rinsing, drying)?: A Little Help from another person to put on  and taking off regular upper body clothing?: A Little Help from another person to put on and taking off regular lower body clothing?: A Little 6 Click Score: 19   End of Session Equipment Utilized During  Treatment: Rolling walker Nurse Communication: Mobility status  Activity Tolerance: Patient tolerated treatment well Patient left: in bed;with call bell/phone within reach;with bed alarm set;with family/visitor present  OT Visit Diagnosis: Other abnormalities of gait and mobility (R26.89);Muscle weakness (generalized) (M62.81);Other symptoms and signs involving cognitive function;History of falling (Z91.81)                Time: 1610-9604 OT Time Calculation (min): 27 min Charges:  OT General Charges $OT Visit: 1 Visit OT Evaluation $OT Eval Moderate Complexity: 1 Mod OT Treatments $Self Care/Home Management : 8-22 mins  Jolaine Artist, OT Acute Rehabilitation Services Pager [phone] Office [phone]   [name] 11/11/2020, 10:31 AM

## 2020-11-11 NOTE — TOC Transition Note (Signed)
Transition of Care [facility]) - CM/SW Discharge Note   Patient Details  Name: [name] MRN: [id number] Date of Birth: 10-May-1946  [facility] CM/SW Contact:  Pollie Friar, RN Phone Number: 11/11/2020, 2:05 PM   Clinical Narrative:    Pt discharging home with continuation of [place] services through [facility].  Pt has transport home.   Final next level of care: Home w Home Health Services Barriers to Discharge: No Barriers Identified   Patient Goals and CMS Choice   CMS Medicare.gov Compare Post Acute Care list provided to:: Patient Choice offered to / list presented to : [place]  Discharge Placement                       Discharge Plan and Services   Discharge Planning Services: CM Consult Post Acute Care Choice: Home Health                    HH Arranged: PT,OT [facility] Agency: [place] Date [place]: 11/11/20   Representative spoke with at [place]: [place] ([place]) Interventions     Readmission Risk Interventions No flowsheet data found.

## 2020-11-11 NOTE — Progress Notes (Signed)
Patient ready for discharge. IV and telemetry removed, discharge paperwork reviewed and confirmed understanding through [facility]. Patient is getting dressed and will be transported home by family member.

## 2020-11-11 NOTE — Evaluation (Signed)
Physical Therapy Evaluation Patient Details Name: [name] MRN: [id number] DOB: Sep 16, 1945 Today's Date: 11/11/2020   History of Present Illness  Pt is a 75 y/o male with PMH of HTN, DM COPD, small cell lung CA s/p chemo and XRT presenting to ED with abnormal MRI brain today.  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 Pt reports slurred speech for 2 weeks intermittently, but no changes over the past 24-48 hrs.  Clinical Impression   Pt presents with generalized weakness, impaired balance with history of multiple falls, impaired gait, decreased safety awareness, and decreased activity tolerance vs baseline. Pt to benefit from acute PT to address deficits. Pt ambulated hallway distance with use of RW, and requires frequent cues for form and safety with use of RW. Pt scored 12/24 on DGI, demonstrating high fall risk. PT recommending pt continue with HHPT. PT to progress mobility as tolerated, and will continue to follow acutely.      Follow Up Recommendations Home health PT;Supervision for mobility/OOB    Equipment Recommendations  None recommended by PT    Recommendations for Other Services       Precautions / Restrictions Precautions Precautions: Fall Precaution Comments: multiple falls in the past 2 months Restrictions Weight Bearing Restrictions: No      Mobility  Bed Mobility Overal bed mobility: Needs Assistance Bed Mobility: Supine to Sit;Sit to Supine     Supine to sit: Supervision;HOB elevated Sit to supine: Supervision;HOB elevated   General bed mobility comments: for safety, very increased time with use of bedrails to perform.    Transfers Overall transfer level: Needs assistance Equipment used: Rolling walker (2 wheeled) Transfers: Sit to/from Stand Sit to Stand: Min assist         General transfer comment: min assist for power up, rise, steady from low surface; min guard  when PT elevated surface. STS x2, from EOB both times.  Ambulation/Gait Ambulation/Gait assistance: Min guard Gait Distance (Feet): 300 Feet Assistive device: Rolling walker (2 wheeled) Gait Pattern/deviations: Step-through pattern;Decreased stride length;Shuffle;Trunk flexed Gait velocity: decr   General Gait Details: min guard for safety, frequent cues for upright posture, placement in RW especially with directional change.  Stairs Stairs: Yes   Stair Management: Two rails;Alternating pattern;Step to pattern;Forwards Number of Stairs: 2    Wheelchair Mobility    Modified Rankin (Stroke Patients Only) Modified Rankin (Stroke Patients Only) Pre-Morbid Rankin Score: Moderate disability Modified Rankin: Moderately severe disability     Balance Overall balance assessment: Needs assistance;History of Falls Sitting-balance support: No upper extremity supported Sitting balance-Leahy Scale: Fair     Standing balance support: Bilateral upper extremity supported;During functional activity Standing balance-Leahy Scale: Poor Standing balance comment: reliant on external support                 Standardized Balance Assessment Standardized Balance Assessment : Dynamic Gait Index   Dynamic Gait Index Level Surface: Mild Impairment Change in Gait Speed: Mild Impairment Gait with Horizontal Head Turns: Mild Impairment Gait with Vertical Head Turns: Mild Impairment Gait and Pivot Turn: Moderate Impairment Step Over Obstacle: Moderate Impairment Step Around Obstacles: Moderate Impairment Steps: Moderate Impairment Total Score: 12       Pertinent Vitals/Pain Pain Assessment: Faces Faces Pain Scale: No hurt Pain Intervention(s): Limited activity within patient's tolerance;Monitored during session    [place] expects to be discharged to:: Private residence Living Arrangements: Other relatives (brother) Available Help at  Discharge: Family;Available  PRN/intermittently Type of Home: House Home Access: Stairs to enter Entrance Stairs-Rails: Can reach both Entrance Stairs-Number of Steps: 3 Home Layout: One level Home Equipment: Walker - 4 wheels;Cane - single point      Prior Function Level of Independence: Independent with assistive device(s)         Comments: uses rollator for mobilty > 50 yards, otherwise using cane; + driving, independent IADLs and medication mgmt     Hand Dominance   Dominant Hand: Right    Extremity/Trunk Assessment   Upper Extremity Assessment Upper Extremity Assessment: Defer to OT evaluation    Lower Extremity Assessment Lower Extremity Assessment: Generalized weakness    Cervical / Trunk Assessment Cervical / Trunk Assessment: Kyphotic  Communication   Communication: No difficulties  Cognition Arousal/Alertness: Awake/alert Behavior During Therapy: Flat affect Overall Cognitive Status: Impaired/Different from baseline Area of Impairment: Problem solving;Following commands;Safety/judgement                       Following Commands: Follows multi-step commands inconsistently;Follows one step commands with increased time Safety/Judgement: Decreased awareness of safety;Decreased awareness of deficits   Problem Solving: Slow processing;Requires verbal cues;Requires tactile cues General Comments: Pt requires frequent cues for safety during mobility, and repetition of same cues over and over. Pt with flat affect and monosyllabic responses to PT. Increased processing time to implement mobility cues.      General Comments      Exercises     Assessment/Plan    PT Assessment Patient needs continued PT services  PT Problem List Decreased strength;Decreased mobility;Decreased safety awareness;Decreased activity tolerance;Decreased balance;Decreased knowledge of use of DME;Decreased coordination       PT Treatment Interventions DME instruction;Therapeutic activities;Gait  training;Therapeutic exercise;Patient/family education;Balance training;Stair training;Functional mobility training;Neuromuscular re-education    PT Goals (Current goals can be found in the Care Plan section)  Acute Rehab PT Goals Patient Stated Goal: go home PT Goal Formulation: With patient Time For Goal Achievement: 11/25/20 Potential to Achieve Goals: Good    Frequency Min 4X/week   Barriers to discharge        Co-evaluation               AM-PAC PT "6 Clicks" Mobility  Outcome Measure Help needed turning from your back to your side while in a flat bed without using bedrails?: A Little Help needed moving from lying on your back to sitting on the side of a flat bed without using bedrails?: A Little Help needed moving to and from a bed to a chair (including a wheelchair)?: A Little Help needed standing up from a chair using your arms (e.g., wheelchair or bedside chair)?: A Little Help needed to walk in hospital room?: A Little Help needed climbing 3-5 steps with a railing? : A Little 6 Click Score: 18    End of Session Equipment Utilized During Treatment: Gait belt Activity Tolerance: Patient tolerated treatment well;Patient limited by fatigue Patient left: in chair;with call bell/phone within reach;with nursing/sitter in room;with family/visitor present (RN changing beds out as pt's bed is broken, RN to put pt back to bed and set bed alarm) Nurse Communication: Mobility status PT Visit Diagnosis: Difficulty in walking, not elsewhere classified (R26.2);Muscle weakness (generalized) (M62.81);Repeated falls (R29.6)    Time: 7408-1448 PT Time Calculation (min) (ACUTE ONLY): 18 min   Charges:   PT Evaluation $PT Eval Low Complexity: 1 Low        Kaliyan Osbourn S, PT Acute Rehabilitation Services Pager  [place] 11/11/2020, 10:08 AM

## 2020-11-11 NOTE — Progress Notes (Signed)
  Echocardiogram 2D Echocardiogram has been performed.  [name] 11/11/2020, 12:46 PM

## 2020-11-11 NOTE — Discharge Summary (Addendum)
PATIENT DETAILS Name: [name] Age: 75 y.o. Sex: male Date of Birth: 04-11-1946 MRN: [id number]. Admitting Physician: Harold Hedge, MD [id number], Delfino Lovett, MD  Admit Date: 11/10/2020 Discharge date: 11/11/2020  Recommendations for Outpatient Follow-up:  1. Follow up with PCP in 1-2 weeks 2. Please ensure follow-up with neuro-oncology 3. Check TSH in the next 2-3 weeks. 4. Check vitamin B12 levels in 3 to 6 months 5. Aspirin/Plavix for 3 weeks followed by Plavix alone  Admitted From:  Home  Disposition: Home with home health [place]: Yes  Equipment/Devices: None  Discharge Condition: Stable  CODE STATUS: DNR  Diet recommendation:  Diet Order            Diet - low sodium heart healthy           Diet heart healthy/carb modified Room service appropriate? 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  Brief Hospital Course: Acute CVA: Essentially incidental finding-he has no focal deficits-admitted for inpatient work-up.  CTA head and neck without any major stenosis, echo with preserved EF, lower extremity Doppler without DVT.  A1c was 5.4, LDL 107.  Recommendations from neurology of aspirin/Plavix for 3 weeks, followed by Plavix alone.  Crestor changed to 20 mg p.o. daily.  Patient will follow with his neuro oncologist Dr. Mickeal Skinner for further continued care.  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 Mickeal Skinner will arrange for outpatient follow-up.  Patient to follow-up with his medical oncologist in the next few weeks as well.  Hypothyroidism: TSH significantly elevated-palpation-he has been somewhat noncompliant  with this medication-resume levothyroxine 200 mcg-PCP to recheck TSH in a few weeks.  HLD: Crestor dosage changed to 20 mg given CVA.  Borderline vitamin B12 deficiency: Continue cyanocobalamin on discharge-recheck vitamin B12 levels in 3 to 6 months.   Discharge Diagnoses:  Principal Problem:   Stroke (cerebrum) ([place]) Active Problems:   DNR (do not resuscitate) discussion   Small cell carcinoma of lung, right ([place])   Brain metastasis ([place])   Discharge Instructions:  Activity:  As tolerated with Full fall precautions use walker/cane & assistance as needed   Discharge Instructions    Call MD for:  extreme fatigue   Complete by: As directed    Call MD for:  persistant dizziness or light-headedness   Complete by: As directed    Diet - low sodium heart healthy   Complete by: As directed    Discharge instructions   Complete by: As directed    Follow with Primary MD  Lucia Gaskins, MD in 1-2 weeks  Please get a complete blood count and chemistry panel checked by your Primary MD at your next visit, and again as instructed by your Primary MD.  Get Medicines reviewed and adjusted: Please take all your medications with you for your next visit with your Primary MD  Laboratory/radiological data: Please request your Primary MD to go over all hospital tests and procedure/radiological results at the follow up, please ask your Primary MD to get all Hospital records sent to his/her office.  In some cases, they will be blood  work, cultures and biopsy results pending at the time of your discharge. Please request that your primary care M.D. follows up on these results.  Also Note the following: If you experience worsening of your admission symptoms, develop shortness of breath, life threatening emergency, suicidal or homicidal thoughts you must seek medical attention immediately by calling 911 or calling your MD immediately  if symptoms less severe.  You must read complete  instructions/literature along with all the possible adverse reactions/side effects for all the Medicines you take and that have been prescribed to you. Take any new Medicines after you have completely understood and accpet all the possible adverse reactions/side effects.   Do not drive when taking Pain medications or sleeping medications (Benzodaizepines)  Do not take more than prescribed Pain, Sleep and Anxiety Medications. It is not advisable to combine anxiety,sleep and pain medications without talking with your primary care practitioner  Special Instructions: If you have smoked or chewed Tobacco  in the last 2 yrs please stop smoking, stop any regular Alcohol  and or any Recreational drug use.  Wear Seat belts while driving.  Please note: You were cared for by a hospitalist during your hospital stay. Once you are discharged, your primary care physician will handle any further medical issues. Please note that NO REFILLS for any discharge medications will be authorized once you are discharged, as it is imperative that you return to your primary care physician (or establish a relationship with a primary care physician if you do not have one) for your post hospital discharge needs so that they can reassess your need for medications and monitor your lab values.   1.  Take aspirin along with Plavix for 3 weeks, after 3 weeks stop aspirin and continue with just Plavix.  2.  Your thyroid hormone levels were significantly elevated-please take your levothyroxine/Synthroid as prescribed.  Please ask your primary care practitioner to recheck TSH (thyroid hormone level) in 3 to 6 weeks.  3.  Vitamin B12 low borderline low-please continue supplementation.  4.  Your cholesterol medication-Crestor's dosage has been increased as you have had a CVA  5.  Please follow-up with Dr. Marin Comment oncology-Dr. [facility] oncology in the next 1-2 weeks.   Increase activity slowly   Complete by: As directed       Allergies as of 11/11/2020   No Known Allergies     Medication List    TAKE these medications   albuterol 108 (90 Base) MCG/ACT inhaler Commonly known as: VENTOLIN HFA Inhale 2 puffs into the lungs every 6 (six) hours as needed for wheezing or shortness of breath.   aspirin 81 MG tablet Take 1 tablet (81 mg total) by mouth daily for 21 days. Take for [redacted] weeks along with Plavix-after 3 weeks stop aspirin and continue with Plavix. What changed: additional instructions   Cholecalciferol 50 MCG (2000 UT) Tbdp Take 1 tablet by mouth daily.   clopidogrel 75 MG tablet Commonly known as: PLAVIX Take 1 tablet (75 mg total) by mouth daily. Start taking on: November 12, 2020   cyanocobalamin 1000 MCG tablet Take 1 tablet (1,000 mcg total) by mouth daily. Start taking on: November 12, 2020   levothyroxine 200 MCG tablet Commonly known as: SYNTHROID Take 1 tablet (200 mcg total) by mouth daily.   multivitamin with minerals Tabs tablet Take 1 tablet by mouth daily.   rosuvastatin 20 MG tablet Commonly known as: CRESTOR Take 1 tablet (20 mg total) by mouth daily. What changed:   medication  strength  how much to take   thiamine 100 MG tablet Take 1 tablet (100 mg total) by mouth daily.       Follow-up [facility] Follow up.   Why: The home health agency will contact you for the next home visit Contact information:  [phone]       Lucia Gaskins, MD. Schedule an appointment as soon as possible for a visit in 1 week(s).   Specialty: Internal Medicine Contact information: [place] [address] [phone]        Ventura Sellers, MD Follow up.   Specialties: Psychiatry, Neurology, Oncology Why: office will call with a follow up appointment Contact information: [place] [address] [phone]        Derek Jack, MD. Schedule an appointment as soon as possible for a visit in 1  week(s).   Specialty: Hematology Contact information: [place] [address] [phone]              No Known Allergies   Other Procedures/Studies: CT ANGIO HEAD W OR WO CONTRAST  Result Date: 11/10/2020 CLINICAL DATA:  Follow-up examination for acute stroke. History of metastatic lung cancer. EXAM: CT ANGIOGRAPHY HEAD AND NECK TECHNIQUE: Multidetector CT imaging of the head and neck was performed using the standard protocol during bolus administration of intravenous contrast. Multiplanar CT image reconstructions and MIPs were obtained to evaluate the vascular anatomy. Carotid stenosis measurements (when applicable) are obtained utilizing NASCET criteria, using the distal internal carotid diameter as the denominator. CONTRAST:  51mL OMNIPAQUE IOHEXOL 350 MG/ML SOLN COMPARISON:  Prior MRI from earlier the same day. FINDINGS: CT HEAD FINDINGS Brain: Generalized age-related cerebral atrophy. Extensive cerebral white matter disease, most likely reflecting post radiation changes. Chronic appearing lacunar infarct at the left pons. Evolving acute left basal ganglia infarct involving the left caudate an adjacent internal capsule, stable from previous MRI. No evidence for hemorrhagic transformation, mass effect, or other complication. No other acute large vessel territory infarct. No intracranial hemorrhage. Previously noted punctate focus of enhancement involving the right temporal cortex not well seen by CT. No other mass lesion, mass effect, or midline shift. No hydrocephalus or extra-axial fluid collection. Vascular: No hyperdense vessel. Extensive calcified atherosclerosis at the skull base. Skull: Scalp soft tissues within normal limits. Calvarium intact. No discrete osseous lesions. Sinuses: Paranasal sinuses and mastoid air cells are largely clear. Orbits: Globes and orbital soft tissues within normal limits. Review of the MIP images confirms the above findings CTA NECK FINDINGS  Aortic arch: Visualized aortic arch normal in caliber with normal branch pattern. Moderate atheromatous change about the arch itself. No hemodynamically significant stenosis about the origin of the great vessels. Right carotid system: Right CCA patent from its origin to the bifurcation without stenosis. Mild mixed plaque about the right carotid bulb without significant stenosis. Right ICA widely patent distally without stenosis, dissection or occlusion. Left carotid system: Left CCA patent from its origin to the bifurcation without stenosis. Eccentric calcified plaque at the left carotid bulb/proximal left ICA without significant stenosis. Left ICA patent distally without stenosis, dissection or occlusion. Vertebral arteries: Left vertebral artery arises from the aortic arch, with the right vertebral artery arising from the right subclavian artery. Both vertebral arteries widely patent within the neck without stenosis, dissection or occlusion. Skeleton: No visible acute osseous finding. No discrete or worrisome osseous lesions. Degenerative osteoarthritic changes noted about the C1-2 articulation without high-grade  stenosis. Other neck: No other acute soft tissue abnormality within the neck. No mass or adenopathy. Upper chest: Extensive post treatment changes noted within the partially visualized lungs. Visualized upper chest demonstrates no other definite acute abnormality. Left-sided Port-A-Cath in place. Review of the MIP images confirms the above findings CTA HEAD FINDINGS Anterior circulation: Petrous segments widely patent. Scattered atheromatous plaque within the carotid siphons with associated mild multifocal stenosis. A1 segments patent bilaterally. Predominant azygos ACA widely patent to its distal aspect. A small mall hypoplastic left A2 segment noted as well. No M1 stenosis or occlusion. Normal MCA bifurcations. Distal MCA branches well perfused and symmetric. Posterior circulation: Multifocal  atheromatous change within the V4 segments bilaterally with associated mild to moderate multifocal stenoses. Both PICA origins patent and normal. Basilar patent to its distal aspect without stenosis. Superior cerebral arteries patent bilaterally. Right PCA supplied via the basilar. Left PCA supplied via the basilar as well as a prominent left posterior communicating artery. Both PCAs well perfused to their distal aspects without stenosis. Venous sinuses: Not well assessed due to timing the contrast bolus. Anatomic variants: Predominant azygos ACA. Review of the MIP images confirms the above findings IMPRESSION: CT HEAD IMPRESSION: 1. Stable evolving acute ischemic left basal ganglia infarct, better evaluated on prior brain MRI from same day. No associated hemorrhage or other complication. 2. No other new acute intracranial abnormality. Previously noted probable small right temporal metastatic lesion not well visualized by CT. 3. Chronic left pontine infarct. 4. Advanced post treatment changes throughout the cerebral white matter. CTA HEAD AND NECK IMPRESSION: 1. Negative CTA for large vessel occlusion. 2. Mild atheromatous change about the carotid bifurcations and carotid siphons without hemodynamically significant stenosis. 3. Atheromatous change about the V4 segments bilaterally with associated mild to moderate multifocal stenoses. 4. Azygos ACA. 5. Extensive post treatment changes within the partially visualized lungs. Electronically Signed   By: Jeannine Boga M.D.   On: 11/10/2020 22:24   CT ANGIO NECK W OR WO CONTRAST  Result Date: 11/10/2020 CLINICAL DATA:  Follow-up examination for acute stroke. History of metastatic lung cancer. EXAM: CT ANGIOGRAPHY HEAD AND NECK TECHNIQUE: Multidetector CT imaging of the head and neck was performed using the standard protocol during bolus administration of intravenous contrast. Multiplanar CT image reconstructions and MIPs were obtained to evaluate the vascular  anatomy. Carotid stenosis measurements (when applicable) are obtained utilizing NASCET criteria, using the distal internal carotid diameter as the denominator. CONTRAST:  94mL OMNIPAQUE IOHEXOL 350 MG/ML SOLN COMPARISON:  Prior MRI from earlier the same day. FINDINGS: CT HEAD FINDINGS Brain: Generalized age-related cerebral atrophy. Extensive cerebral white matter disease, most likely reflecting post radiation changes. Chronic appearing lacunar infarct at the left pons. Evolving acute left basal ganglia infarct involving the left caudate an adjacent internal capsule, stable from previous MRI. No evidence for hemorrhagic transformation, mass effect, or other complication. No other acute large vessel territory infarct. No intracranial hemorrhage. Previously noted punctate focus of enhancement involving the right temporal cortex not well seen by CT. No other mass lesion, mass effect, or midline shift. No hydrocephalus or extra-axial fluid collection. Vascular: No hyperdense vessel. Extensive calcified atherosclerosis at the skull base. Skull: Scalp soft tissues within normal limits. Calvarium intact. No discrete osseous lesions. Sinuses: Paranasal sinuses and mastoid air cells are largely clear. Orbits: Globes and orbital soft tissues within normal limits. Review of the MIP images confirms the above findings CTA NECK FINDINGS Aortic arch: Visualized aortic arch normal in caliber with normal branch  pattern. Moderate atheromatous change about the arch itself. No hemodynamically significant stenosis about the origin of the great vessels. Right carotid system: Right CCA patent from its origin to the bifurcation without stenosis. Mild mixed plaque about the right carotid bulb without significant stenosis. Right ICA widely patent distally without stenosis, dissection or occlusion. Left carotid system: Left CCA patent from its origin to the bifurcation without stenosis. Eccentric calcified plaque at the left carotid  bulb/proximal left ICA without significant stenosis. Left ICA patent distally without stenosis, dissection or occlusion. Vertebral arteries: Left vertebral artery arises from the aortic arch, with the right vertebral artery arising from the right subclavian artery. Both vertebral arteries widely patent within the neck without stenosis, dissection or occlusion. Skeleton: No visible acute osseous finding. No discrete or worrisome osseous lesions. Degenerative osteoarthritic changes noted about the C1-2 articulation without high-grade stenosis. Other neck: No other acute soft tissue abnormality within the neck. No mass or adenopathy. Upper chest: Extensive post treatment changes noted within the partially visualized lungs. Visualized upper chest demonstrates no other definite acute abnormality. Left-sided Port-A-Cath in place. Review of the MIP images confirms the above findings CTA HEAD FINDINGS Anterior circulation: Petrous segments widely patent. Scattered atheromatous plaque within the carotid siphons with associated mild multifocal stenosis. A1 segments patent bilaterally. Predominant azygos ACA widely patent to its distal aspect. A small mall hypoplastic left A2 segment noted as well. No M1 stenosis or occlusion. Normal MCA bifurcations. Distal MCA branches well perfused and symmetric. Posterior circulation: Multifocal atheromatous change within the V4 segments bilaterally with associated mild to moderate multifocal stenoses. Both PICA origins patent and normal. Basilar patent to its distal aspect without stenosis. Superior cerebral arteries patent bilaterally. Right PCA supplied via the basilar. Left PCA supplied via the basilar as well as a prominent left posterior communicating artery. Both PCAs well perfused to their distal aspects without stenosis. Venous sinuses: Not well assessed due to timing the contrast bolus. Anatomic variants: Predominant azygos ACA. Review of the MIP images confirms the above  findings IMPRESSION: CT HEAD IMPRESSION: 1. Stable evolving acute ischemic left basal ganglia infarct, better evaluated on prior brain MRI from same day. No associated hemorrhage or other complication. 2. No other new acute intracranial abnormality. Previously noted probable small right temporal metastatic lesion not well visualized by CT. 3. Chronic left pontine infarct. 4. Advanced post treatment changes throughout the cerebral white matter. CTA HEAD AND NECK IMPRESSION: 1. Negative CTA for large vessel occlusion. 2. Mild atheromatous change about the carotid bifurcations and carotid siphons without hemodynamically significant stenosis. 3. Atheromatous change about the V4 segments bilaterally with associated mild to moderate multifocal stenoses. 4. Azygos ACA. 5. Extensive post treatment changes within the partially visualized lungs. Electronically Signed   By: Jeannine Boga M.D.   On: 11/10/2020 22:24   MR BRAIN W WO CONTRAST  Result Date: 11/10/2020 CLINICAL DATA:  Brain/CNS neoplasm, assess treatment response. EXAM: MRI HEAD WITHOUT AND WITH CONTRAST TECHNIQUE: Multiplanar, multiecho pulse sequences of the brain and surrounding structures were obtained without and with intravenous contrast. CONTRAST:  16 mL of MultiHance IV. COMPARISON:  MRI June 16, 2020 and MRI January 15, 2020. FINDINGS: Brain: Acute infarct involving the left caudate and overlying corona radiata. No substantial mass effect. No acute hemorrhage. Previously seen punctate focus of restricted diffusion in the right para hippocampal region has resolved and there is no abnormal enhancement in this region. Left pontine infarct which does not restrict diffusion but is new since January 15, 2020. Similar advanced T2/FLAIR hyperintensity within the white matter, likely due to radiation. No hydrocephalus. Approximately 3 mm focus of cortical enhancement in the posterior right temporal lobe (series 12, image 82 and series 11, image 74), which  is not present on the most recent prior contrasted study from January 15, 2020. No hydrocephalus. No midline shift. Basal cisterns are patent. Vascular: Major arterial flow voids are maintained at the skull base. Skull and upper cervical spine: Normal marrow signal. Sinuses/Orbits: Clear sinuses.  Unremarkable orbits. Other: Small left and trace right mastoid effusions. IMPRESSION: 1. Acute infarct involving the left caudate and overlying corona radiata. No substantial mass effect. 2. Approximately 3 mm focus of cortical enhancement in the posterior right temporal lobe, concerning for a new metastasis. 3. Left pontine infarct which does not appear acute but is new since January 15, 2020. 4. Similar post-radiation changes. Acute stroke findings discussed with Dr. Mickeal Skinner via telephone at 12:25 PM who recommended that the patient go to the ED. I conveyed this to the tech Lonn Georgia) at 12:35 PM. Electronically Signed   By: Margaretha Sheffield MD   On: 11/10/2020 13:03   ECHOCARDIOGRAM COMPLETE  Result Date: 11/11/2020    ECHOCARDIOGRAM REPORT   Patient Name:   JIMMYLEE RATTERREE Date of Exam: 11/11/2020 Medical Rec #:  [id number]    Height:       71.0 in Accession #:    [id number]   Weight:       159.6 lb Date of Birth:  [birth year]    BSA:          1.916 m Patient Age:    61 years     BP:           147/85 mmHg Patient Gender: M            HR:           51 bpm. Exam Location:  Inpatient Procedure: 2D Echo, Cardiac Doppler and Color Doppler Indications:    Stroke I63.9  History:        Patient has no prior history of Echocardiogram examinations.                 COPD; Risk Factors:Hypertension, Diabetes and Dyslipidemia.  Sonographer:    Jonelle Sidle Dance Referring Phys: [id number] [place]  1. Left ventricular ejection fraction, by estimation, is 55 to 60%. The left ventricle has normal function. The left ventricle has no regional wall motion abnormalities. There is mild left ventricular hypertrophy. Left ventricular diastolic  parameters were normal.  2. Right ventricular systolic function is normal. The right ventricular size is normal.  3. Left atrial size was mildly dilated.  4. The mitral valve is abnormal. Trivial mitral valve regurgitation. No evidence of mitral stenosis.  5. Some fusion of right and non coronary cusps . The aortic valve is abnormal. There is moderate calcification of the aortic valve. There is moderate thickening of the aortic valve. Aortic valve regurgitation is mild. Mild aortic valve stenosis.  6. Aortic dilatation noted. There is moderate dilatation of the ascending aorta, measuring 45 mm.  7. The inferior vena cava is normal in size with greater than 50% respiratory variability, suggesting right atrial pressure of 3 mmHg. FINDINGS  Left Ventricle: Left ventricular ejection fraction, by estimation, is 55 to 60%. The left ventricle has normal function. The left ventricle has no regional wall motion abnormalities. The left ventricular internal cavity size was normal in size. There is  mild left ventricular  hypertrophy. Left ventricular diastolic parameters were normal. Right Ventricle: The right ventricular size is normal. No increase in right ventricular wall thickness. Right ventricular systolic function is normal. Left Atrium: Left atrial size was mildly dilated. Right Atrium: Right atrial size was normal in size. Pericardium: There is no evidence of pericardial effusion. Mitral Valve: The mitral valve is abnormal. There is mild thickening of the mitral valve leaflet(s). There is mild calcification of the mitral valve leaflet(s). Mild mitral annular calcification. Trivial mitral valve regurgitation. No evidence of mitral valve stenosis. Tricuspid Valve: The tricuspid valve is normal in structure. Tricuspid valve regurgitation is not demonstrated. No evidence of tricuspid stenosis. Aortic Valve: Some fusion of right and non coronary cusps. The aortic valve is abnormal. There is moderate calcification of the  aortic valve. There is moderate thickening of the aortic valve. Aortic valve regurgitation is mild. Aortic regurgitation PHT measures 1035 msec. Mild aortic stenosis is present. Aortic valve mean gradient measures 9.0 mmHg. Aortic valve peak gradient measures 13.7 mmHg. Aortic valve area, by VTI measures 1.64 cm. Pulmonic Valve: The pulmonic valve was normal in structure. Pulmonic valve regurgitation is not visualized. No evidence of pulmonic stenosis. Aorta: The aortic root is normal in size and structure and aortic dilatation noted. There is moderate dilatation of the ascending aorta, measuring 45 mm. Venous: The inferior vena cava is normal in size with greater than 50% respiratory variability, suggesting right atrial pressure of 3 mmHg. IAS/Shunts: The interatrial septum was not well visualized.  LEFT VENTRICLE PLAX 2D LVIDd:         4.80 cm  Diastology LVIDs:         3.50 cm  LV e' medial:    3.53 cm/s LV PW:         1.20 cm  LV E/e' medial:  13.7 LV IVS:        1.20 cm  LV e' lateral:   4.88 cm/s LVOT diam:     2.10 cm  LV E/e' lateral: 9.9 LV SV:         75 LV SV Index:   39 LVOT Area:     3.46 cm  RIGHT VENTRICLE            IVC RV Basal diam:  2.50 cm    IVC diam: 1.45 cm RV S prime:     6.15 cm/s TAPSE (M-mode): 2.2 cm LEFT ATRIUM             Index       RIGHT ATRIUM           Index LA diam:        3.90 cm 2.04 cm/m  RA Area:     13.90 cm LA Vol (A2C):   75.8 ml 39.57 ml/m RA Volume:   25.80 ml  13.47 ml/m LA Vol (A4C):   50.3 ml 26.26 ml/m LA Biplane Vol: 66.0 ml 34.45 ml/m  AORTIC VALVE AV Area (Vmax):    1.50 cm AV Area (Vmean):   1.41 cm AV Area (VTI):     1.64 cm AV Vmax:           185.00 cm/s AV Vmean:          141.000 cm/s AV VTI:            0.455 m AV Peak Grad:      13.7 mmHg AV Mean Grad:      9.0 mmHg LVOT Vmax:  80.30 cm/s LVOT Vmean:        57.200 cm/s LVOT VTI:          0.216 m LVOT/AV VTI ratio: 0.47 AI PHT:            1035 msec  AORTA Ao Root diam: 3.90 cm Ao Asc diam:   4.50 cm MITRAL VALVE MV Area (PHT): 1.61 cm    SHUNTS MV Decel Time: 470 msec    Systemic VTI:  0.22 m MV E velocity: 48.40 cm/s  Systemic Diam: 2.10 cm MV A velocity: 55.30 cm/s MV E/A ratio:  0.88 Jenkins Rouge MD Electronically signed by Jenkins Rouge MD Signature Date/Time: 11/11/2020/1:08:22 PM    Final    VAS [place] LOWER EXTREMITY VENOUS (DVT)  Result Date: 11/11/2020  Lower Venous DVT Study Indications: Stroke.  Comparison Study: 08/22/2018- negative bilateral lower extremity venous duplex Performing Technologist: Maudry Mayhew MHA, RDMS, RVT, RDCS  Examination Guidelines: A complete evaluation includes B-mode imaging, spectral Doppler, color Doppler, and power Doppler as needed of all accessible portions of each vessel. Bilateral testing is considered an integral part of a complete examination. Limited examinations for reoccurring indications may be performed as noted. The reflux portion of the exam is performed with the patient in reverse Trendelenburg.  +---------+---------------+---------+-----------+----------+--------------+ RIGHT    CompressibilityPhasicitySpontaneityPropertiesThrombus Aging +---------+---------------+---------+-----------+----------+--------------+ CFV      Full           Yes      Yes                                 +---------+---------------+---------+-----------+----------+--------------+ SFJ      Full                                                        +---------+---------------+---------+-----------+----------+--------------+ FV Prox  Full                                                        +---------+---------------+---------+-----------+----------+--------------+ FV Mid   Full                                                        +---------+---------------+---------+-----------+----------+--------------+ FV DistalFull                                                         +---------+---------------+---------+-----------+----------+--------------+ PFV      Full                                                        +---------+---------------+---------+-----------+----------+--------------+ POP      Full  Yes      Yes                                 +---------+---------------+---------+-----------+----------+--------------+ PTV      Full                                                        +---------+---------------+---------+-----------+----------+--------------+ PERO     Full                                                        +---------+---------------+---------+-----------+----------+--------------+   +---------+---------------+---------+-----------+----------+--------------+ LEFT     CompressibilityPhasicitySpontaneityPropertiesThrombus Aging +---------+---------------+---------+-----------+----------+--------------+ CFV      Full           Yes      Yes                                 +---------+---------------+---------+-----------+----------+--------------+ SFJ      Full                                                        +---------+---------------+---------+-----------+----------+--------------+ FV Prox  Full                                                        +---------+---------------+---------+-----------+----------+--------------+ FV Mid   Full                                                        +---------+---------------+---------+-----------+----------+--------------+ FV DistalFull                                                        +---------+---------------+---------+-----------+----------+--------------+ PFV      Full                                                        +---------+---------------+---------+-----------+----------+--------------+ POP      Full           Yes      Yes                                  +---------+---------------+---------+-----------+----------+--------------+ PTV  Full                                                        +---------+---------------+---------+-----------+----------+--------------+ PERO     Full                                                        +---------+---------------+---------+-----------+----------+--------------+     Summary: RIGHT: - There is no evidence of deep vein thrombosis in the lower extremity.  - No cystic structure found in the popliteal fossa.  LEFT: - There is no evidence of deep vein thrombosis in the lower extremity.  - No cystic structure found in the popliteal fossa.  *See table(s) above for measurements and observations.    Preliminary      TODAY-DAY OF DISCHARGE:  Subjective:   Melford Tullier today has no headache,no chest abdominal pain,no new weakness tingling or numbness, feels much better wants to go home today.   Objective:   Blood pressure (!) 150/87, pulse (!) 52, temperature 98.1 F (36.7 C), temperature source Oral, resp. rate 20, SpO2 99 %.  Intake/Output Summary (Last 24 hours) at 11/11/2020 1357 Last data filed at 11/11/2020 1255 Gross per 24 hour  Intake --  Output 400 ml  Net -400 ml   There were no vitals filed for this visit.  Exam: Awake Alert, Oriented *3, No new F.N deficits, Normal affect [place].AT,PERRAL Supple Neck,No JVD, No cervical lymphadenopathy appriciated.  Symmetrical Chest wall movement, Good air movement bilaterally, CTAB RRR,No Gallops,Rubs or new Murmurs, No Parasternal Heave +ve B.Sounds, Abd Soft, Non tender, No organomegaly appriciated, No rebound -guarding or rigidity. No Cyanosis, Clubbing or edema, No new Rash or bruise   PERTINENT RADIOLOGIC STUDIES: CT ANGIO HEAD W OR WO CONTRAST  Result Date: 11/10/2020 CLINICAL DATA:  Follow-up examination for acute stroke. History of metastatic lung cancer. EXAM: CT ANGIOGRAPHY HEAD AND NECK TECHNIQUE: Multidetector CT imaging of the  head and neck was performed using the standard protocol during bolus administration of intravenous contrast. Multiplanar CT image reconstructions and MIPs were obtained to evaluate the vascular anatomy. Carotid stenosis measurements (when applicable) are obtained utilizing NASCET criteria, using the distal internal carotid diameter as the denominator. CONTRAST:  64mL OMNIPAQUE IOHEXOL 350 MG/ML SOLN COMPARISON:  Prior MRI from earlier the same day. FINDINGS: CT HEAD FINDINGS Brain: Generalized age-related cerebral atrophy. Extensive cerebral white matter disease, most likely reflecting post radiation changes. Chronic appearing lacunar infarct at the left pons. Evolving acute left basal ganglia infarct involving the left caudate an adjacent internal capsule, stable from previous MRI. No evidence for hemorrhagic transformation, mass effect, or other complication. No other acute large vessel territory infarct. No intracranial hemorrhage. Previously noted punctate focus of enhancement involving the right temporal cortex not well seen by CT. No other mass lesion, mass effect, or midline shift. No hydrocephalus or extra-axial fluid collection. Vascular: No hyperdense vessel. Extensive calcified atherosclerosis at the skull base. Skull: Scalp soft tissues within normal limits. Calvarium intact. No discrete osseous lesions. Sinuses: Paranasal sinuses and mastoid air cells are largely clear. Orbits: Globes and orbital soft tissues within normal limits. Review of the MIP images confirms the  above findings CTA NECK FINDINGS Aortic arch: Visualized aortic arch normal in caliber with normal branch pattern. Moderate atheromatous change about the arch itself. No hemodynamically significant stenosis about the origin of the great vessels. Right carotid system: Right CCA patent from its origin to the bifurcation without stenosis. Mild mixed plaque about the right carotid bulb without significant stenosis. Right ICA widely patent  distally without stenosis, dissection or occlusion. Left carotid system: Left CCA patent from its origin to the bifurcation without stenosis. Eccentric calcified plaque at the left carotid bulb/proximal left ICA without significant stenosis. Left ICA patent distally without stenosis, dissection or occlusion. Vertebral arteries: Left vertebral artery arises from the aortic arch, with the right vertebral artery arising from the right subclavian artery. Both vertebral arteries widely patent within the neck without stenosis, dissection or occlusion. Skeleton: No visible acute osseous finding. No discrete or worrisome osseous lesions. Degenerative osteoarthritic changes noted about the C1-2 articulation without high-grade stenosis. Other neck: No other acute soft tissue abnormality within the neck. No mass or adenopathy. Upper chest: Extensive post treatment changes noted within the partially visualized lungs. Visualized upper chest demonstrates no other definite acute abnormality. Left-sided Port-A-Cath in place. Review of the MIP images confirms the above findings CTA HEAD FINDINGS Anterior circulation: Petrous segments widely patent. Scattered atheromatous plaque within the carotid siphons with associated mild multifocal stenosis. A1 segments patent bilaterally. Predominant azygos ACA widely patent to its distal aspect. A small mall hypoplastic left A2 segment noted as well. No M1 stenosis or occlusion. Normal MCA bifurcations. Distal MCA branches well perfused and symmetric. Posterior circulation: Multifocal atheromatous change within the V4 segments bilaterally with associated mild to moderate multifocal stenoses. Both PICA origins patent and normal. Basilar patent to its distal aspect without stenosis. Superior cerebral arteries patent bilaterally. Right PCA supplied via the basilar. Left PCA supplied via the basilar as well as a prominent left posterior communicating artery. Both PCAs well perfused to their distal  aspects without stenosis. Venous sinuses: Not well assessed due to timing the contrast bolus. Anatomic variants: Predominant azygos ACA. Review of the MIP images confirms the above findings IMPRESSION: CT HEAD IMPRESSION: 1. Stable evolving acute ischemic left basal ganglia infarct, better evaluated on prior brain MRI from same day. No associated hemorrhage or other complication. 2. No other new acute intracranial abnormality. Previously noted probable small right temporal metastatic lesion not well visualized by CT. 3. Chronic left pontine infarct. 4. Advanced post treatment changes throughout the cerebral white matter. CTA HEAD AND NECK IMPRESSION: 1. Negative CTA for large vessel occlusion. 2. Mild atheromatous change about the carotid bifurcations and carotid siphons without hemodynamically significant stenosis. 3. Atheromatous change about the V4 segments bilaterally with associated mild to moderate multifocal stenoses. 4. Azygos ACA. 5. Extensive post treatment changes within the partially visualized lungs. Electronically Signed   By: Jeannine Boga M.D.   On: 11/10/2020 22:24   CT ANGIO NECK W OR WO CONTRAST  Result Date: 11/10/2020 CLINICAL DATA:  Follow-up examination for acute stroke. History of metastatic lung cancer. EXAM: CT ANGIOGRAPHY HEAD AND NECK TECHNIQUE: Multidetector CT imaging of the head and neck was performed using the standard protocol during bolus administration of intravenous contrast. Multiplanar CT image reconstructions and MIPs were obtained to evaluate the vascular anatomy. Carotid stenosis measurements (when applicable) are obtained utilizing NASCET criteria, using the distal internal carotid diameter as the denominator. CONTRAST:  8mL OMNIPAQUE IOHEXOL 350 MG/ML SOLN COMPARISON:  Prior MRI from earlier the same day. FINDINGS:  CT HEAD FINDINGS Brain: Generalized age-related cerebral atrophy. Extensive cerebral white matter disease, most likely reflecting post radiation  changes. Chronic appearing lacunar infarct at the left pons. Evolving acute left basal ganglia infarct involving the left caudate an adjacent internal capsule, stable from previous MRI. No evidence for hemorrhagic transformation, mass effect, or other complication. No other acute large vessel territory infarct. No intracranial hemorrhage. Previously noted punctate focus of enhancement involving the right temporal cortex not well seen by CT. No other mass lesion, mass effect, or midline shift. No hydrocephalus or extra-axial fluid collection. Vascular: No hyperdense vessel. Extensive calcified atherosclerosis at the skull base. Skull: Scalp soft tissues within normal limits. Calvarium intact. No discrete osseous lesions. Sinuses: Paranasal sinuses and mastoid air cells are largely clear. Orbits: Globes and orbital soft tissues within normal limits. Review of the MIP images confirms the above findings CTA NECK FINDINGS Aortic arch: Visualized aortic arch normal in caliber with normal branch pattern. Moderate atheromatous change about the arch itself. No hemodynamically significant stenosis about the origin of the great vessels. Right carotid system: Right CCA patent from its origin to the bifurcation without stenosis. Mild mixed plaque about the right carotid bulb without significant stenosis. Right ICA widely patent distally without stenosis, dissection or occlusion. Left carotid system: Left CCA patent from its origin to the bifurcation without stenosis. Eccentric calcified plaque at the left carotid bulb/proximal left ICA without significant stenosis. Left ICA patent distally without stenosis, dissection or occlusion. Vertebral arteries: Left vertebral artery arises from the aortic arch, with the right vertebral artery arising from the right subclavian artery. Both vertebral arteries widely patent within the neck without stenosis, dissection or occlusion. Skeleton: No visible acute osseous finding. No discrete or  worrisome osseous lesions. Degenerative osteoarthritic changes noted about the C1-2 articulation without high-grade stenosis. Other neck: No other acute soft tissue abnormality within the neck. No mass or adenopathy. Upper chest: Extensive post treatment changes noted within the partially visualized lungs. Visualized upper chest demonstrates no other definite acute abnormality. Left-sided Port-A-Cath in place. Review of the MIP images confirms the above findings CTA HEAD FINDINGS Anterior circulation: Petrous segments widely patent. Scattered atheromatous plaque within the carotid siphons with associated mild multifocal stenosis. A1 segments patent bilaterally. Predominant azygos ACA widely patent to its distal aspect. A small mall hypoplastic left A2 segment noted as well. No M1 stenosis or occlusion. Normal MCA bifurcations. Distal MCA branches well perfused and symmetric. Posterior circulation: Multifocal atheromatous change within the V4 segments bilaterally with associated mild to moderate multifocal stenoses. Both PICA origins patent and normal. Basilar patent to its distal aspect without stenosis. Superior cerebral arteries patent bilaterally. Right PCA supplied via the basilar. Left PCA supplied via the basilar as well as a prominent left posterior communicating artery. Both PCAs well perfused to their distal aspects without stenosis. Venous sinuses: Not well assessed due to timing the contrast bolus. Anatomic variants: Predominant azygos ACA. Review of the MIP images confirms the above findings IMPRESSION: CT HEAD IMPRESSION: 1. Stable evolving acute ischemic left basal ganglia infarct, better evaluated on prior brain MRI from same day. No associated hemorrhage or other complication. 2. No other new acute intracranial abnormality. Previously noted probable small right temporal metastatic lesion not well visualized by CT. 3. Chronic left pontine infarct. 4. Advanced post treatment changes throughout the  cerebral white matter. CTA HEAD AND NECK IMPRESSION: 1. Negative CTA for large vessel occlusion. 2. Mild atheromatous change about the carotid bifurcations and carotid siphons without hemodynamically  significant stenosis. 3. Atheromatous change about the V4 segments bilaterally with associated mild to moderate multifocal stenoses. 4. Azygos ACA. 5. Extensive post treatment changes within the partially visualized lungs. Electronically Signed   By: Jeannine Boga M.D.   On: 11/10/2020 22:24   MR BRAIN W WO CONTRAST  Result Date: 11/10/2020 CLINICAL DATA:  Brain/CNS neoplasm, assess treatment response. EXAM: MRI HEAD WITHOUT AND WITH CONTRAST TECHNIQUE: Multiplanar, multiecho pulse sequences of the brain and surrounding structures were obtained without and with intravenous contrast. CONTRAST:  16 mL of MultiHance IV. COMPARISON:  MRI June 16, 2020 and MRI January 15, 2020. FINDINGS: Brain: Acute infarct involving the left caudate and overlying corona radiata. No substantial mass effect. No acute hemorrhage. Previously seen punctate focus of restricted diffusion in the right para hippocampal region has resolved and there is no abnormal enhancement in this region. Left pontine infarct which does not restrict diffusion but is new since January 15, 2020. Similar advanced T2/FLAIR hyperintensity within the white matter, likely due to radiation. No hydrocephalus. Approximately 3 mm focus of cortical enhancement in the posterior right temporal lobe (series 12, image 82 and series 11, image 74), which is not present on the most recent prior contrasted study from January 15, 2020. No hydrocephalus. No midline shift. Basal cisterns are patent. Vascular: Major arterial flow voids are maintained at the skull base. Skull and upper cervical spine: Normal marrow signal. Sinuses/Orbits: Clear sinuses.  Unremarkable orbits. Other: Small left and trace right mastoid effusions. IMPRESSION: 1. Acute infarct involving the left caudate  and overlying corona radiata. No substantial mass effect. 2. Approximately 3 mm focus of cortical enhancement in the posterior right temporal lobe, concerning for a new metastasis. 3. Left pontine infarct which does not appear acute but is new since January 15, 2020. 4. Similar post-radiation changes. Acute stroke findings discussed with Dr. Mickeal Skinner via telephone at 12:25 PM who recommended that the patient go to the ED. I conveyed this to the tech Lonn Georgia) at 12:35 PM. Electronically Signed   By: Margaretha Sheffield MD   On: 11/10/2020 13:03   ECHOCARDIOGRAM COMPLETE  Result Date: 11/11/2020    ECHOCARDIOGRAM REPORT   Patient Name:   [name] Date of Exam: 11/11/2020 Medical Rec #:  [id number]    Height:       71.0 in Accession #:    [id number]   Weight:       159.6 lb Date of Birth:  02-27-1946    BSA:          1.916 m Patient Age:    37 years     BP:           147/85 mmHg Patient Gender: M            HR:           51 bpm. Exam Location:  Inpatient Procedure: 2D Echo, Cardiac Doppler and Color Doppler Indications:    Stroke I63.9  History:        Patient has no prior history of Echocardiogram examinations.                 COPD; Risk Factors:Hypertension, Diabetes and Dyslipidemia.  Sonographer:    Jonelle Sidle Dance Referring Phys: [id number] [place]  1. Left ventricular ejection fraction, by estimation, is 55 to 60%. The left ventricle has normal function. The left ventricle has no regional wall motion abnormalities. There is mild left ventricular hypertrophy. Left ventricular diastolic parameters were normal.  2. Right ventricular systolic function is normal. The right ventricular size is normal.  3. Left atrial size was mildly dilated.  4. The mitral valve is abnormal. Trivial mitral valve regurgitation. No evidence of mitral stenosis.  5. Some fusion of right and non coronary cusps . The aortic valve is abnormal. There is moderate calcification of the aortic valve. There is moderate thickening of the  aortic valve. Aortic valve regurgitation is mild. Mild aortic valve stenosis.  6. Aortic dilatation noted. There is moderate dilatation of the ascending aorta, measuring 45 mm.  7. The inferior vena cava is normal in size with greater than 50% respiratory variability, suggesting right atrial pressure of 3 mmHg. FINDINGS  Left Ventricle: Left ventricular ejection fraction, by estimation, is 55 to 60%. The left ventricle has normal function. The left ventricle has no regional wall motion abnormalities. The left ventricular internal cavity size was normal in size. There is  mild left ventricular hypertrophy. Left ventricular diastolic parameters were normal. Right Ventricle: The right ventricular size is normal. No increase in right ventricular wall thickness. Right ventricular systolic function is normal. Left Atrium: Left atrial size was mildly dilated. Right Atrium: Right atrial size was normal in size. Pericardium: There is no evidence of pericardial effusion. Mitral Valve: The mitral valve is abnormal. There is mild thickening of the mitral valve leaflet(s). There is mild calcification of the mitral valve leaflet(s). Mild mitral annular calcification. Trivial mitral valve regurgitation. No evidence of mitral valve stenosis. Tricuspid Valve: The tricuspid valve is normal in structure. Tricuspid valve regurgitation is not demonstrated. No evidence of tricuspid stenosis. Aortic Valve: Some fusion of right and non coronary cusps. The aortic valve is abnormal. There is moderate calcification of the aortic valve. There is moderate thickening of the aortic valve. Aortic valve regurgitation is mild. Aortic regurgitation PHT measures 1035 msec. Mild aortic stenosis is present. Aortic valve mean gradient measures 9.0 mmHg. Aortic valve peak gradient measures 13.7 mmHg. Aortic valve area, by VTI measures 1.64 cm. Pulmonic Valve: The pulmonic valve was normal in structure. Pulmonic valve regurgitation is not visualized. No  evidence of pulmonic stenosis. Aorta: The aortic root is normal in size and structure and aortic dilatation noted. There is moderate dilatation of the ascending aorta, measuring 45 mm. Venous: The inferior vena cava is normal in size with greater than 50% respiratory variability, suggesting right atrial pressure of 3 mmHg. IAS/Shunts: The interatrial septum was not well visualized.  LEFT VENTRICLE PLAX 2D LVIDd:         4.80 cm  Diastology LVIDs:         3.50 cm  LV e' medial:    3.53 cm/s LV PW:         1.20 cm  LV E/e' medial:  13.7 LV IVS:        1.20 cm  LV e' lateral:   4.88 cm/s LVOT diam:     2.10 cm  LV E/e' lateral: 9.9 LV SV:         75 LV SV Index:   39 LVOT Area:     3.46 cm  RIGHT VENTRICLE            IVC RV Basal diam:  2.50 cm    IVC diam: 1.45 cm RV S prime:     6.15 cm/s TAPSE (M-mode): 2.2 cm LEFT ATRIUM             Index       RIGHT ATRIUM  Index LA diam:        3.90 cm 2.04 cm/m  RA Area:     13.90 cm LA Vol (A2C):   75.8 ml 39.57 ml/m RA Volume:   25.80 ml  13.47 ml/m LA Vol (A4C):   50.3 ml 26.26 ml/m LA Biplane Vol: 66.0 ml 34.45 ml/m  AORTIC VALVE AV Area (Vmax):    1.50 cm AV Area (Vmean):   1.41 cm AV Area (VTI):     1.64 cm AV Vmax:           185.00 cm/s AV Vmean:          141.000 cm/s AV VTI:            0.455 m AV Peak Grad:      13.7 mmHg AV Mean Grad:      9.0 mmHg LVOT Vmax:         80.30 cm/s LVOT Vmean:        57.200 cm/s LVOT VTI:          0.216 m LVOT/AV VTI ratio: 0.47 AI PHT:            1035 msec  AORTA Ao Root diam: 3.90 cm Ao Asc diam:  4.50 cm MITRAL VALVE MV Area (PHT): 1.61 cm    SHUNTS MV Decel Time: 470 msec    Systemic VTI:  0.22 m MV E velocity: 48.40 cm/s  Systemic Diam: 2.10 cm MV A velocity: 55.30 cm/s MV E/A ratio:  0.88 Jenkins Rouge MD Electronically signed by Jenkins Rouge MD Signature Date/Time: 11/11/2020/1:08:22 PM    Final    VAS [place] LOWER EXTREMITY VENOUS (DVT)  Result Date: 11/11/2020  Lower Venous DVT Study Indications: Stroke.  Comparison  Study: 08/22/2018- negative bilateral lower extremity venous duplex Performing Technologist: Maudry Mayhew MHA, RDMS, RVT, RDCS  Examination Guidelines: A complete evaluation includes B-mode imaging, spectral Doppler, color Doppler, and power Doppler as needed of all accessible portions of each vessel. Bilateral testing is considered an integral part of a complete examination. Limited examinations for reoccurring indications may be performed as noted. The reflux portion of the exam is performed with the patient in reverse Trendelenburg.  +---------+---------------+---------+-----------+----------+--------------+ RIGHT    CompressibilityPhasicitySpontaneityPropertiesThrombus Aging +---------+---------------+---------+-----------+----------+--------------+ CFV      Full           Yes      Yes                                 +---------+---------------+---------+-----------+----------+--------------+ SFJ      Full                                                        +---------+---------------+---------+-----------+----------+--------------+ FV Prox  Full                                                        +---------+---------------+---------+-----------+----------+--------------+ FV Mid   Full                                                        +---------+---------------+---------+-----------+----------+--------------+  FV DistalFull                                                        +---------+---------------+---------+-----------+----------+--------------+ PFV      Full                                                        +---------+---------------+---------+-----------+----------+--------------+ POP      Full           Yes      Yes                                 +---------+---------------+---------+-----------+----------+--------------+ PTV      Full                                                         +---------+---------------+---------+-----------+----------+--------------+ PERO     Full                                                        +---------+---------------+---------+-----------+----------+--------------+   +---------+---------------+---------+-----------+----------+--------------+ LEFT     CompressibilityPhasicitySpontaneityPropertiesThrombus Aging +---------+---------------+---------+-----------+----------+--------------+ CFV      Full           Yes      Yes                                 +---------+---------------+---------+-----------+----------+--------------+ SFJ      Full                                                        +---------+---------------+---------+-----------+----------+--------------+ FV Prox  Full                                                        +---------+---------------+---------+-----------+----------+--------------+ FV Mid   Full                                                        +---------+---------------+---------+-----------+----------+--------------+ FV DistalFull                                                        +---------+---------------+---------+-----------+----------+--------------+  PFV      Full                                                        +---------+---------------+---------+-----------+----------+--------------+ POP      Full           Yes      Yes                                 +---------+---------------+---------+-----------+----------+--------------+ PTV      Full                                                        +---------+---------------+---------+-----------+----------+--------------+ PERO     Full                                                        +---------+---------------+---------+-----------+----------+--------------+     Summary: RIGHT: - There is no evidence of deep vein thrombosis in the lower extremity.  - No cystic structure found in  the popliteal fossa.  LEFT: - There is no evidence of deep vein thrombosis in the lower extremity.  - No cystic structure found in the popliteal fossa.  *See table(s) above for measurements and observations.    Preliminary      PERTINENT LAB RESULTS: CBC: Recent Labs    11/10/20 1425 11/10/20 1446 11/10/20 1835 11/11/20 0249  WBC 7.0  --   --  5.5  HGB 13.3   < > 13.3 13.0  HCT 41.3   < > 39.9 38.6*  PLT 235  --   --  216   < > = values in this interval not displayed.   CMET CMP     Component Value Date/Time   NA 138 11/11/2020 0249   K 4.0 11/11/2020 0249   CL 103 11/11/2020 0249   CO2 30 11/11/2020 0249   GLUCOSE 98 11/11/2020 0249   BUN 13 11/11/2020 0249   CREATININE 1.07 11/11/2020 0249   CALCIUM 9.2 11/11/2020 0249   PROT 7.0 11/10/2020 1425   ALBUMIN 4.1 11/10/2020 1425   AST 13 (L) 11/10/2020 1425   ALT 8 11/10/2020 1425   ALKPHOS 61 11/10/2020 1425   BILITOT 0.8 11/10/2020 1425   GFRNONAA >60 11/11/2020 0249   GFRAA >60 03/09/2020 0925    GFR CrCl cannot be calculated (Unknown ideal weight.). No results for input(s): LIPASE, AMYLASE in the last 72 hours. No results for input(s): CKTOTAL, CKMB, CKMBINDEX, TROPONINI in the last 72 hours. Invalid input(s): POCBNP No results for input(s): DDIMER in the last 72 hours. Recent Labs    11/11/20 0249  HGBA1C 5.4   Recent Labs    11/11/20 0249  CHOL 161  HDL 38*  LDLCALC 107*  TRIG 78  CHOLHDL 4.2   Recent Labs    11/10/20 1835  TSH 44.212*   Recent Labs    11/10/20 1835  VITAMINB12 258   Coags:  Recent Labs    11/10/20 1425  INR 1.1   Microbiology: Recent Results (from the past 240 hour(s))  Resp Panel by RT-PCR (Flu A&B, Covid) Nasopharyngeal Swab     Status: None   Collection Time: 11/10/20  2:25 PM   Specimen: Nasopharyngeal Swab; Nasopharyngeal(NP) swabs in vial transport medium  Result Value Ref Range Status   SARS Coronavirus 2 by RT PCR NEGATIVE NEGATIVE Final    Comment:  (NOTE) SARS-CoV-2 target nucleic acids are NOT DETECTED.  The SARS-CoV-2 RNA is generally detectable in upper respiratory specimens during the acute phase of infection. The lowest concentration of SARS-CoV-2 viral copies this assay can detect is 138 copies/mL. A negative result does not preclude SARS-Cov-2 infection and should not be used as the sole basis for treatment or other patient management decisions. A negative result may occur with  improper specimen collection/handling, submission of specimen other than nasopharyngeal swab, presence of viral mutation(s) within the areas targeted by this assay, and inadequate number of viral copies(<138 copies/mL). A negative result must be combined with clinical observations, patient history, and epidemiological information. The expected result is Negative.  Fact Sheet for Patients:  EntrepreneurPulse.com.au  Fact Sheet for Healthcare Providers:  IncredibleEmployment.be  This test is no t yet approved or cleared by the [place] FDA and  has been authorized for detection and/or diagnosis of SARS-CoV-2 by FDA under an Emergency Use Authorization (EUA). This EUA will remain  in effect (meaning this test can be used) for the duration of the COVID-19 declaration under Section 564(b)(1) of the Act, 21 U.S.C.section 360bbb-3(b)(1), unless the authorization is terminated  or revoked sooner.       Influenza A by PCR NEGATIVE NEGATIVE Final   Influenza B by PCR NEGATIVE NEGATIVE Final    Comment: (NOTE) The Xpert Xpress SARS-CoV-2/FLU/RSV plus assay is intended as an aid in the diagnosis of influenza from Nasopharyngeal swab specimens and should not be used as a sole basis for treatment. Nasal washings and aspirates are unacceptable for Xpert Xpress SARS-CoV-2/FLU/RSV testing.  Fact Sheet for Patients: EntrepreneurPulse.com.au  Fact Sheet for Healthcare  Providers: IncredibleEmployment.be  This test is not yet approved or cleared by the [place] FDA and has been authorized for detection and/or diagnosis of SARS-CoV-2 by FDA under an Emergency Use Authorization (EUA). This EUA will remain in effect (meaning this test can be used) for the duration of the COVID-19 declaration under Section 564(b)(1) of the Act, 21 U.S.C. section 360bbb-3(b)(1), unless the authorization is terminated or revoked.  Performed at [facility], [place] [address], [place], [place] [address]     FURTHER DISCHARGE INSTRUCTIONS:  Get Medicines reviewed and adjusted: Please take all your medications with you for your next visit with your Primary MD  Laboratory/radiological data: Please request your Primary MD to go over all hospital tests and procedure/radiological results at the follow up, please ask your Primary MD to get all Hospital records sent to his/her office.  In some cases, they will be blood work, cultures and biopsy results pending at the time of your discharge. Please request that your primary care M.D. goes through all the records of your hospital data and follows up on these results.  Also Note the following: If you experience worsening of your admission symptoms, develop shortness of breath, life threatening emergency, suicidal or homicidal thoughts you must seek medical attention immediately by calling 911 or calling your MD immediately  if symptoms less severe.  You must read complete instructions/literature  along with all the possible adverse reactions/side effects for all the Medicines you take and that have been prescribed to you. Take any new Medicines after you have completely understood and accpet all the possible adverse reactions/side effects.   Do not drive when taking Pain medications or sleeping medications (Benzodaizepines)  Do not take more than prescribed Pain, Sleep and Anxiety Medications. It  is not advisable to combine anxiety,sleep and pain medications without talking with your primary care practitioner  Special Instructions: If you have smoked or chewed Tobacco  in the last 2 yrs please stop smoking, stop any regular Alcohol  and or any Recreational drug use.  Wear Seat belts while driving.  Please note: You were cared for by a hospitalist during your hospital stay. Once you are discharged, your primary care physician will handle any further medical issues. Please note that NO REFILLS for any discharge medications will be authorized once you are discharged, as it is imperative that you return to your primary care physician (or establish a relationship with a primary care physician if you do not have one) for your post hospital discharge needs so that they can reassess your need for medications and monitor your lab values.  Total Time spent coordinating discharge including counseling, education and face to face time equals 35 minutes.  SignedOren Binet 11/11/2020 1:57 PM

## 2020-11-11 NOTE — Progress Notes (Signed)
Bilateral lower extremity venous duplex completed. Refer to "CV Proc" under chart review to view preliminary results.  11/11/2020 11:06 AM Kelby Aline., MHA, RVT, RDCS, RDMS

## 2020-11-16 ENCOUNTER — Ambulatory Visit (HOSPITAL_COMMUNITY): Admission: RE | Admit: 2020-11-16 | Payer: TRICARE For Life (TFL) | Source: Ambulatory Visit

## 2020-11-18 ENCOUNTER — Other Ambulatory Visit: Payer: Self-pay

## 2020-11-18 ENCOUNTER — Inpatient Hospital Stay: Payer: Medicare Other | Attending: Internal Medicine | Admitting: Internal Medicine

## 2020-11-18 VITALS — BP 148/91 | HR 65 | Temp 97.8°F | Resp 17 | Ht 71.0 in | Wt 176.1 lb

## 2020-11-18 DIAGNOSIS — Z9221 Personal history of antineoplastic chemotherapy: Secondary | ICD-10-CM | POA: Diagnosis not present

## 2020-11-18 DIAGNOSIS — E119 Type 2 diabetes mellitus without complications: Secondary | ICD-10-CM | POA: Diagnosis not present

## 2020-11-18 DIAGNOSIS — C3491 Malignant neoplasm of unspecified part of right bronchus or lung: Secondary | ICD-10-CM | POA: Insufficient documentation

## 2020-11-18 DIAGNOSIS — J449 Chronic obstructive pulmonary disease, unspecified: Secondary | ICD-10-CM | POA: Diagnosis not present

## 2020-11-18 DIAGNOSIS — Z7982 Long term (current) use of aspirin: Secondary | ICD-10-CM | POA: Diagnosis not present

## 2020-11-18 DIAGNOSIS — I63312 Cerebral infarction due to thrombosis of left middle cerebral artery: Secondary | ICD-10-CM | POA: Diagnosis not present

## 2020-11-18 DIAGNOSIS — Z8673 Personal history of transient ischemic attack (TIA), and cerebral infarction without residual deficits: Secondary | ICD-10-CM | POA: Diagnosis not present

## 2020-11-18 DIAGNOSIS — Z7902 Long term (current) use of antithrombotics/antiplatelets: Secondary | ICD-10-CM | POA: Diagnosis not present

## 2020-11-18 DIAGNOSIS — C7931 Secondary malignant neoplasm of brain: Secondary | ICD-10-CM | POA: Diagnosis not present

## 2020-11-18 DIAGNOSIS — Z79899 Other long term (current) drug therapy: Secondary | ICD-10-CM | POA: Insufficient documentation

## 2020-11-18 DIAGNOSIS — F17210 Nicotine dependence, cigarettes, uncomplicated: Secondary | ICD-10-CM | POA: Diagnosis not present

## 2020-11-18 DIAGNOSIS — E785 Hyperlipidemia, unspecified: Secondary | ICD-10-CM | POA: Insufficient documentation

## 2020-11-18 DIAGNOSIS — I10 Essential (primary) hypertension: Secondary | ICD-10-CM | POA: Diagnosis not present

## 2020-11-18 NOTE — Progress Notes (Signed)
[facility] [place], [place] [address] [phone]   Interval Evaluation  Date of Service: 11/18/20 Patient Name: [name] Patient MRN: [id number] Patient DOB: [birth year] Provider: Ventura Sellers, MD  Identifying Statement:  [name] is a 75 y.o. male with Cerebrovascular accident (CVA) due to thrombosis of left middle cerebral artery ([place]) [I63.312]   Primary Cancer:  Oncologic History: 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[place])  08/22/2018 Initial Diagnosis   Small cell carcinoma of lung, right ([place])   08/23/2018 - 11/01/2018 Chemotherapy   The patient had palonosetron (ALOXI) injection 0.25 mg, 0.25 mg, Intravenous,  Once, 4 of 4 cycles Administration: 0.25 mg (08/23/2018), 0.25 mg (09/16/2018), 0.25 mg (10/07/2018), 0.25 mg (10/28/2018) pegfilgrastim-cbqv (UDENYCA) injection 6 mg, 6 mg, Subcutaneous, Once, 4 of 4 cycles Administration: 6 mg (08/27/2018), 6 mg (09/20/2018), 6 mg (10/11/2018), 6 mg (11/01/2018) CARBOplatin (PARAPLATIN) 560 mg in sodium chloride 0.9 % 250 mL chemo infusion, 560 mg (100 % of original dose 564 mg), Intravenous,  Once, 4 of 4 cycles Dose modification:   (original dose 564 mg, Cycle 1),   (original dose 523.5 mg, Cycle 3),   (original dose 523.5 mg, Cycle 4) Administration: 560 mg (08/23/2018), 560 mg (09/16/2018), 520 mg (10/07/2018), 510 mg (10/28/2018) etoposide (VEPESID) 220 mg in sodium chloride 0.9 % 1,000 mL chemo infusion, 100 mg/m2 = 220 mg, Intravenous,  Once, 4 of 4 cycles Administration: 220 mg (08/23/2018), 200 mg (09/16/2018), 200 mg (09/17/2018), 200 mg (09/18/2018), 200 mg (10/07/2018), 200 mg (10/08/2018), 200 mg (10/09/2018), 200 mg (10/28/2018), 200 mg (10/29/2018), 200 mg (10/30/2018) fosaprepitant (EMEND) 150 mg, dexamethasone (DECADRON) 12 mg in sodium chloride 0.9 % 145 mL IVPB, , Intravenous,  Once, 4 of 4 cycles Administration:   (08/23/2018),  (09/16/2018),  (10/07/2018),  (10/28/2018) durvalumab (IMFINZI) 1,500 mg in sodium chloride 0.9 % 100 mL chemo infusion, 1,500 mg (100 % of original dose 1,500 mg), Intravenous,  Once, 3 of 3 cycles Dose modification: 1,500 mg (original dose 1,500 mg, Cycle 2, Reason: Provider Judgment) Administration: 1,500 mg (09/16/2018), 1,500 mg (10/07/2018), 1,500 mg (10/28/2018)  for chemotherapy treatment.    Small cell carcinoma of lung, right ([place])  08/23/2018 - 11/01/2018 Chemotherapy   The patient had palonosetron (ALOXI) injection 0.25 mg, 0.25 mg, Intravenous,  Once, 4 of 4 cycles Administration: 0.25 mg (08/23/2018), 0.25 mg (09/16/2018), 0.25 mg (10/07/2018), 0.25 mg (10/28/2018) pegfilgrastim-cbqv (UDENYCA) injection 6 mg, 6 mg, Subcutaneous, Once, 4 of 4 cycles Administration: 6 mg (08/27/2018), 6 mg (09/20/2018), 6 mg (10/11/2018), 6 mg (11/01/2018) CARBOplatin (PARAPLATIN) 560 mg in sodium chloride 0.9 % 250 mL chemo infusion, 560 mg (100 % of original dose 564 mg), Intravenous,  Once, 4 of 4 cycles Dose modification:   (original dose 564 mg, Cycle 1),   (original dose 523.5 mg, Cycle 3),   (original dose 523.5 mg, Cycle 4) Administration: 560 mg (08/23/2018), 560 mg (09/16/2018), 520 mg (10/07/2018), 510 mg (10/28/2018) etoposide (VEPESID) 220 mg in sodium chloride 0.9 % 1,000 mL chemo infusion, 100 mg/m2 = 220 mg, Intravenous,  Once, 4 of 4 cycles Administration: 220 mg (08/23/2018), 200 mg (09/16/2018), 200 mg (09/17/2018), 200 mg (09/18/2018), 200 mg (10/07/2018), 200 mg (10/08/2018), 200 mg (10/09/2018), 200 mg (10/28/2018), 200 mg (10/29/2018), 200 mg (10/30/2018) fosaprepitant (EMEND) 150 mg, dexamethasone (DECADRON) 12 mg in sodium chloride 0.9 % 145 mL IVPB, , Intravenous,  Once, 4 of 4  cycles Administration:  (08/23/2018),  (09/16/2018),  (10/07/2018),  (10/28/2018) durvalumab (IMFINZI) 1,500 mg in sodium chloride 0.9 % 100 mL chemo infusion, 1,500 mg (100 % of original dose 1,500 mg), Intravenous,  Once, 3 of 3  cycles Dose modification: 1,500 mg (original dose 1,500 mg, Cycle 2, Reason: Provider Judgment) Administration: 1,500 mg (09/16/2018), 1,500 mg (10/07/2018), 1,500 mg (10/28/2018)  for chemotherapy treatment.    08/26/2018 Initial Diagnosis   Small cell carcinoma of lung, right ([place])   11/20/2018 -  Chemotherapy   The patient had durvalumab (IMFINZI) 1,500 mg in sodium chloride 0.9 % 100 mL chemo infusion, 1,500 mg (100 % of original dose 1,500 mg), Intravenous,  Once, 4 of 9 cycles Dose modification: 1,500 mg (original dose 1,500 mg, Cycle 1, Reason: Other (see comments)) Administration: 1,500 mg (11/20/2018), 1,500 mg (12/19/2018), 1,500 mg (01/16/2019), 1,500 mg (02/13/2019)  for chemotherapy treatment.     CNS Oncologic History 09/13/18: Completes WBRT   Interval History:  [name] presents today for follow up after recent hospitalization for acute stroke.  He denies any focal stroke-like symptoms, findings was made incidentally after MRI scan last week.  He does complain of short term memory loss, which has intensified in recent weeks/months.  He is still mostly able to function independently; he is able to take care of his needs and even do a little driving.  Issues with balance and gait persist, are not worse from baseline.  Currently on ASA+plavix for 3 weeks per neuro-hospitalist team.  Medications: Current Outpatient Medications on File Prior to Visit  Medication Sig Dispense Refill  . albuterol (PROVENTIL HFA;VENTOLIN HFA) 108 (90 Base) MCG/ACT inhaler Inhale 2 puffs into the lungs every 6 (six) hours as needed for wheezing or shortness of breath.    Marland Kitchen aspirin 81 MG tablet Take 1 tablet (81 mg total) by mouth daily for 21 days. Take for [redacted] weeks along with Plavix-after 3 weeks stop aspirin and continue with Plavix. 30 tablet   . Cholecalciferol 50 MCG (2000 UT) TBDP Take 1 tablet by mouth daily.    . clopidogrel (PLAVIX) 75 MG tablet Take 1 tablet (75 mg total) by mouth daily. 30 tablet  1  . levothyroxine (SYNTHROID) 150 MCG tablet Take 150 mcg by mouth daily.    . Multiple Vitamin (MULTIVITAMIN WITH MINERALS) TABS tablet Take 1 tablet by mouth daily.    . rosuvastatin (CRESTOR) 20 MG tablet Take 1 tablet (20 mg total) by mouth daily. 30 tablet 0  . thiamine 100 MG tablet Take 1 tablet (100 mg total) by mouth daily. 30 tablet 2  . vitamin B-12 1000 MCG tablet Take 1 tablet (1,000 mcg total) by mouth daily. 30 tablet 1   No current facility-administered medications on file prior to visit.    Allergies: No Known Allergies Past Medical History:  Past Medical History:  Diagnosis Date  . Actinic keratosis   . Arthritis   . Colitis MAY 2012    CT ABD/PELVIS HEP FLEXURE  . COPD (chronic obstructive pulmonary disease) ([place])   . Diabetes mellitus without complication ([place])   . History of kidney stones   . Hyperlipemia   . Hypertension   . NSAID long-term use NAPROXEN FOR OA  . SCL Ca dx'd 08/2018   Past Surgical History:  Past Surgical History:  Procedure Laterality Date  . BACK SURGERY     spinal stenosis  . BASAL CELL CARCINOMA EXCISION  FACE, arms feet, leg  . CHOLECYSTECTOMY  JUNE 2011  MJ   STONES, PANCREATITIS  . KNEE ARTHROSCOPY WITH MEDIAL MENISECTOMY Right 03/24/2015   Procedure: KNEE ARTHROSCOPY WITH MEDIAL MENISECTOMY;  Surgeon: Carole Civil, MD;  Location: [facility];  Service: Orthopedics;  Laterality: Right;  . KNEE SURGERY Left LEFT   arthroscopy  . LITHOTRIPSY  80s  . PORTACATH PLACEMENT Left 09/11/2018   Procedure: INSERTION PORT-A-CATH;  Surgeon: Virl Cagey, MD;  Location: [facility];  Service: General;  Laterality: Left;  . SPINE SURGERY     Social History:  Social History   Socioeconomic History  . Marital status: Divorced    Spouse name: Not on file  . Number of children: 0  . Years of education: Not on file  . Highest education level: Not on file  Occupational History  . Occupation: [employer]   . Occupation: Manufacturing engineer    Tobacco Use  . Smoking status: Current Some Day Smoker    Packs/day: 1.00    Years: 50.00    Pack years: 50.00    Types: Cigarettes    Last attempt to quit: 08/07/2018    Years since quitting: 2.2  . Smokeless tobacco: Never Used  Substance and Sexual Activity  . Alcohol use: Yes    Comment: once a month  . Drug use: No  . Sexual activity: Never    Birth control/protection: None  Other Topics Concern  . 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   Social Determinants of Health   Financial Resource Strain: Not on file  Food Insecurity: Not on file  Transportation Needs: Not on file  Physical Activity: Not on file  Stress: Not on file  Social Connections: Not on file  Intimate Partner Violence: Not on file   Family History:  Family History  Problem Relation Age of Onset  . Cancer Mother        lung  . Cancer Father        lung and liver  . Colon polyps Neg Hx   . Colon cancer Neg Hx     Review of Systems: Constitutional: Doesn't report fevers, chills or abnormal weight loss Eyes: Doesn't report blurriness of vision Ears, nose, mouth, throat, and face: Doesn't report sore throat Respiratory: Doesn't report cough, dyspnea or wheezes Cardiovascular: Doesn't report palpitation, chest discomfort  Gastrointestinal:  Doesn't report nausea, constipation, diarrhea GU: Doesn't report incontinence Skin: Doesn't report skin rashes Neurological: Per HPI Musculoskeletal: Doesn't report joint pain Behavioral/Psych: Doesn't report anxiety  Physical Exam: Vitals:   11/18/20 1111  BP: (!) 148/91  Pulse: 65  Resp: 17  Temp: 97.8 F (36.6 C)  SpO2: 95%   KPS: 70. General: Alert, cooperative, pleasant, in no acute distress Head: Normal EENT: No conjunctival injection or scleral icterus.  Lungs: Resp effort normal Cardiac: Regular rate Abdomen: Non-distended abdomen Skin:  No rashes cyanosis or petechiae. Extremities: No clubbing or edema  Neurologic Exam: Mental Status: Awake, alert, attentive to examiner. Oriented to self and environment. Language is fluent with intact comprehension.  Cranial Nerves: Visual acuity is grossly normal. Visual fields are full. Extra-ocular movements intact. No ptosis. Face is symmetric Motor: Tone and bulk are normal. Power is full in both arms and legs. Reflexes are symmetric, no pathologic reflexes present.  Sensory: Intact to light touch Gait: Normal.   Labs: I have reviewed the data as listed    Component Value  Date/Time   NA 138 11/11/2020 0249   K 4.0 11/11/2020 0249   CL 103 11/11/2020 0249   CO2 30 11/11/2020 0249   GLUCOSE 98 11/11/2020 0249   BUN 13 11/11/2020 0249   CREATININE 1.07 11/11/2020 0249   CALCIUM 9.2 11/11/2020 0249   PROT 7.0 11/10/2020 1425   ALBUMIN 4.1 11/10/2020 1425   AST 13 (L) 11/10/2020 1425   ALT 8 11/10/2020 1425   ALKPHOS 61 11/10/2020 1425   BILITOT 0.8 11/10/2020 1425   GFRNONAA >60 11/11/2020 0249   GFRAA >60 03/09/2020 0925   Lab Results  Component Value Date   WBC 5.5 11/11/2020   NEUTROABS 4.8 11/10/2020   HGB 13.0 11/11/2020   HCT 38.6 (L) 11/11/2020   MCV 97.7 11/11/2020   PLT 216 11/11/2020    Imaging:  CT ANGIO HEAD W OR WO CONTRAST  Result Date: 11/10/2020 CLINICAL DATA:  Follow-up examination for acute stroke. History of metastatic lung cancer. EXAM: CT ANGIOGRAPHY HEAD AND NECK TECHNIQUE: Multidetector CT imaging of the head and neck was performed using the standard protocol during bolus administration of intravenous contrast. Multiplanar CT image reconstructions and MIPs were obtained to evaluate the vascular anatomy. Carotid stenosis measurements (when applicable) are obtained utilizing NASCET criteria, using the distal internal carotid diameter as the denominator. CONTRAST:  96mL OMNIPAQUE IOHEXOL 350 MG/ML SOLN COMPARISON:  Prior MRI from earlier the same  day. FINDINGS: CT HEAD FINDINGS Brain: Generalized age-related cerebral atrophy. Extensive cerebral white matter disease, most likely reflecting post radiation changes. Chronic appearing lacunar infarct at the left pons. Evolving acute left basal ganglia infarct involving the left caudate an adjacent internal capsule, stable from previous MRI. No evidence for hemorrhagic transformation, mass effect, or other complication. No other acute large vessel territory infarct. No intracranial hemorrhage. Previously noted punctate focus of enhancement involving the right temporal cortex not well seen by CT. No other mass lesion, mass effect, or midline shift. No hydrocephalus or extra-axial fluid collection. Vascular: No hyperdense vessel. Extensive calcified atherosclerosis at the skull base. Skull: Scalp soft tissues within normal limits. Calvarium intact. No discrete osseous lesions. Sinuses: Paranasal sinuses and mastoid air cells are largely clear. Orbits: Globes and orbital soft tissues within normal limits. Review of the MIP images confirms the above findings CTA NECK FINDINGS Aortic arch: Visualized aortic arch normal in caliber with normal branch pattern. Moderate atheromatous change about the arch itself. No hemodynamically significant stenosis about the origin of the great vessels. Right carotid system: Right CCA patent from its origin to the bifurcation without stenosis. Mild mixed plaque about the right carotid bulb without significant stenosis. Right ICA widely patent distally without stenosis, dissection or occlusion. Left carotid system: Left CCA patent from its origin to the bifurcation without stenosis. Eccentric calcified plaque at the left carotid bulb/proximal left ICA without significant stenosis. Left ICA patent distally without stenosis, dissection or occlusion. Vertebral arteries: Left vertebral artery arises from the aortic arch, with the right vertebral artery arising from the right subclavian  artery. Both vertebral arteries widely patent within the neck without stenosis, dissection or occlusion. Skeleton: No visible acute osseous finding. No discrete or worrisome osseous lesions. Degenerative osteoarthritic changes noted about the C1-2 articulation without high-grade stenosis. Other neck: No other acute soft tissue abnormality within the neck. No mass or adenopathy. Upper chest: Extensive post treatment changes noted within the partially visualized lungs. Visualized upper chest demonstrates no other definite acute abnormality. Left-sided Port-A-Cath in place. Review of the MIP images confirms  the above findings CTA HEAD FINDINGS Anterior circulation: Petrous segments widely patent. Scattered atheromatous plaque within the carotid siphons with associated mild multifocal stenosis. A1 segments patent bilaterally. Predominant azygos ACA widely patent to its distal aspect. A small mall hypoplastic left A2 segment noted as well. No M1 stenosis or occlusion. Normal MCA bifurcations. Distal MCA branches well perfused and symmetric. Posterior circulation: Multifocal atheromatous change within the V4 segments bilaterally with associated mild to moderate multifocal stenoses. Both PICA origins patent and normal. Basilar patent to its distal aspect without stenosis. Superior cerebral arteries patent bilaterally. Right PCA supplied via the basilar. Left PCA supplied via the basilar as well as a prominent left posterior communicating artery. Both PCAs well perfused to their distal aspects without stenosis. Venous sinuses: Not well assessed due to timing the contrast bolus. Anatomic variants: Predominant azygos ACA. Review of the MIP images confirms the above findings IMPRESSION: CT HEAD IMPRESSION: 1. Stable evolving acute ischemic left basal ganglia infarct, better evaluated on prior brain MRI from same day. No associated hemorrhage or other complication. 2. No other new acute intracranial abnormality. Previously  noted probable small right temporal metastatic lesion not well visualized by CT. 3. Chronic left pontine infarct. 4. Advanced post treatment changes throughout the cerebral white matter. CTA HEAD AND NECK IMPRESSION: 1. Negative CTA for large vessel occlusion. 2. Mild atheromatous change about the carotid bifurcations and carotid siphons without hemodynamically significant stenosis. 3. Atheromatous change about the V4 segments bilaterally with associated mild to moderate multifocal stenoses. 4. Azygos ACA. 5. Extensive post treatment changes within the partially visualized lungs. Electronically Signed   By: Jeannine Boga M.D.   On: 11/10/2020 22:24   CT ANGIO NECK W OR WO CONTRAST  Result Date: 11/10/2020 CLINICAL DATA:  Follow-up examination for acute stroke. History of metastatic lung cancer. EXAM: CT ANGIOGRAPHY HEAD AND NECK TECHNIQUE: Multidetector CT imaging of the head and neck was performed using the standard protocol during bolus administration of intravenous contrast. Multiplanar CT image reconstructions and MIPs were obtained to evaluate the vascular anatomy. Carotid stenosis measurements (when applicable) are obtained utilizing NASCET criteria, using the distal internal carotid diameter as the denominator. CONTRAST:  78mL OMNIPAQUE IOHEXOL 350 MG/ML SOLN COMPARISON:  Prior MRI from earlier the same day. FINDINGS: CT HEAD FINDINGS Brain: Generalized age-related cerebral atrophy. Extensive cerebral white matter disease, most likely reflecting post radiation changes. Chronic appearing lacunar infarct at the left pons. Evolving acute left basal ganglia infarct involving the left caudate an adjacent internal capsule, stable from previous MRI. No evidence for hemorrhagic transformation, mass effect, or other complication. No other acute large vessel territory infarct. No intracranial hemorrhage. Previously noted punctate focus of enhancement involving the right temporal cortex not well seen by CT. No  other mass lesion, mass effect, or midline shift. No hydrocephalus or extra-axial fluid collection. Vascular: No hyperdense vessel. Extensive calcified atherosclerosis at the skull base. Skull: Scalp soft tissues within normal limits. Calvarium intact. No discrete osseous lesions. Sinuses: Paranasal sinuses and mastoid air cells are largely clear. Orbits: Globes and orbital soft tissues within normal limits. Review of the MIP images confirms the above findings CTA NECK FINDINGS Aortic arch: Visualized aortic arch normal in caliber with normal branch pattern. Moderate atheromatous change about the arch itself. No hemodynamically significant stenosis about the origin of the great vessels. Right carotid system: Right CCA patent from its origin to the bifurcation without stenosis. Mild mixed plaque about the right carotid bulb without significant stenosis. Right ICA widely  patent distally without stenosis, dissection or occlusion. Left carotid system: Left CCA patent from its origin to the bifurcation without stenosis. Eccentric calcified plaque at the left carotid bulb/proximal left ICA without significant stenosis. Left ICA patent distally without stenosis, dissection or occlusion. Vertebral arteries: Left vertebral artery arises from the aortic arch, with the right vertebral artery arising from the right subclavian artery. Both vertebral arteries widely patent within the neck without stenosis, dissection or occlusion. Skeleton: No visible acute osseous finding. No discrete or worrisome osseous lesions. Degenerative osteoarthritic changes noted about the C1-2 articulation without high-grade stenosis. Other neck: No other acute soft tissue abnormality within the neck. No mass or adenopathy. Upper chest: Extensive post treatment changes noted within the partially visualized lungs. Visualized upper chest demonstrates no other definite acute abnormality. Left-sided Port-A-Cath in place. Review of the MIP images confirms  the above findings CTA HEAD FINDINGS Anterior circulation: Petrous segments widely patent. Scattered atheromatous plaque within the carotid siphons with associated mild multifocal stenosis. A1 segments patent bilaterally. Predominant azygos ACA widely patent to its distal aspect. A small mall hypoplastic left A2 segment noted as well. No M1 stenosis or occlusion. Normal MCA bifurcations. Distal MCA branches well perfused and symmetric. Posterior circulation: Multifocal atheromatous change within the V4 segments bilaterally with associated mild to moderate multifocal stenoses. Both PICA origins patent and normal. Basilar patent to its distal aspect without stenosis. Superior cerebral arteries patent bilaterally. Right PCA supplied via the basilar. Left PCA supplied via the basilar as well as a prominent left posterior communicating artery. Both PCAs well perfused to their distal aspects without stenosis. Venous sinuses: Not well assessed due to timing the contrast bolus. Anatomic variants: Predominant azygos ACA. Review of the MIP images confirms the above findings IMPRESSION: CT HEAD IMPRESSION: 1. Stable evolving acute ischemic left basal ganglia infarct, better evaluated on prior brain MRI from same day. No associated hemorrhage or other complication. 2. No other new acute intracranial abnormality. Previously noted probable small right temporal metastatic lesion not well visualized by CT. 3. Chronic left pontine infarct. 4. Advanced post treatment changes throughout the cerebral white matter. CTA HEAD AND NECK IMPRESSION: 1. Negative CTA for large vessel occlusion. 2. Mild atheromatous change about the carotid bifurcations and carotid siphons without hemodynamically significant stenosis. 3. Atheromatous change about the V4 segments bilaterally with associated mild to moderate multifocal stenoses. 4. Azygos ACA. 5. Extensive post treatment changes within the partially visualized lungs. Electronically Signed   By:  Jeannine Boga M.D.   On: 11/10/2020 22:24   MR BRAIN W WO CONTRAST  Result Date: 11/10/2020 CLINICAL DATA:  Brain/CNS neoplasm, assess treatment response. EXAM: MRI HEAD WITHOUT AND WITH CONTRAST TECHNIQUE: Multiplanar, multiecho pulse sequences of the brain and surrounding structures were obtained without and with intravenous contrast. CONTRAST:  16 mL of MultiHance IV. COMPARISON:  MRI June 16, 2020 and MRI January 15, 2020. FINDINGS: Brain: Acute infarct involving the left caudate and overlying corona radiata. No substantial mass effect. No acute hemorrhage. Previously seen punctate focus of restricted diffusion in the right para hippocampal region has resolved and there is no abnormal enhancement in this region. Left pontine infarct which does not restrict diffusion but is new since January 15, 2020. Similar advanced T2/FLAIR hyperintensity within the white matter, likely due to radiation. No hydrocephalus. Approximately 3 mm focus of cortical enhancement in the posterior right temporal lobe (series 12, image 82 and series 11, image 74), which is not present on the most recent prior contrasted  study from January 15, 2020. No hydrocephalus. No midline shift. Basal cisterns are patent. Vascular: Major arterial flow voids are maintained at the skull base. Skull and upper cervical spine: Normal marrow signal. Sinuses/Orbits: Clear sinuses.  Unremarkable orbits. Other: Small left and trace right mastoid effusions. IMPRESSION: 1. Acute infarct involving the left caudate and overlying corona radiata. No substantial mass effect. 2. Approximately 3 mm focus of cortical enhancement in the posterior right temporal lobe, concerning for a new metastasis. 3. Left pontine infarct which does not appear acute but is new since January 15, 2020. 4. Similar post-radiation changes. Acute stroke findings discussed with Dr. Mickeal Skinner via telephone at 12:25 PM who recommended that the patient go to the ED. I conveyed this to the tech  [name]) at 12:35 PM. Electronically Signed   By: Margaretha Sheffield MD   On: 11/10/2020 13:03   ECHOCARDIOGRAM COMPLETE  Result Date: 11/11/2020    ECHOCARDIOGRAM REPORT   Patient Name:   [name] Date of Exam: 11/11/2020 Medical Rec #:  [id number]    Height:       71.0 in Accession #:    [id number]   Weight:       159.6 lb Date of Birth:  04-09-1946    BSA:          1.916 m Patient Age:    66 years     BP:           147/85 mmHg Patient Gender: M            HR:           51 bpm. Exam Location:  Inpatient Procedure: 2D Echo, Cardiac Doppler and Color Doppler Indications:    Stroke I63.9  History:        Patient has no prior history of Echocardiogram examinations.                 COPD; Risk Factors:Hypertension, Diabetes and Dyslipidemia.  Sonographer:    Jonelle Sidle Dance Referring Phys: [id number] [place]  1. Left ventricular ejection fraction, by estimation, is 55 to 60%. The left ventricle has normal function. The left ventricle has no regional wall motion abnormalities. There is mild left ventricular hypertrophy. Left ventricular diastolic parameters were normal.  2. Right ventricular systolic function is normal. The right ventricular size is normal.  3. Left atrial size was mildly dilated.  4. The mitral valve is abnormal. Trivial mitral valve regurgitation. No evidence of mitral stenosis.  5. Some fusion of right and non coronary cusps . The aortic valve is abnormal. There is moderate calcification of the aortic valve. There is moderate thickening of the aortic valve. Aortic valve regurgitation is mild. Mild aortic valve stenosis.  6. Aortic dilatation noted. There is moderate dilatation of the ascending aorta, measuring 45 mm.  7. The inferior vena cava is normal in size with greater than 50% respiratory variability, suggesting right atrial pressure of 3 mmHg. FINDINGS  Left Ventricle: Left ventricular ejection fraction, by estimation, is 55 to 60%. The left ventricle has normal function. The  left ventricle has no regional wall motion abnormalities. The left ventricular internal cavity size was normal in size. There is  mild left ventricular hypertrophy. Left ventricular diastolic parameters were normal. Right Ventricle: The right ventricular size is normal. No increase in right ventricular wall thickness. Right ventricular systolic function is normal. Left Atrium: Left atrial size was mildly dilated. Right Atrium: Right atrial size was normal in size. Pericardium: There  is no evidence of pericardial effusion. Mitral Valve: The mitral valve is abnormal. There is mild thickening of the mitral valve leaflet(s). There is mild calcification of the mitral valve leaflet(s). Mild mitral annular calcification. Trivial mitral valve regurgitation. No evidence of mitral valve stenosis. Tricuspid Valve: The tricuspid valve is normal in structure. Tricuspid valve regurgitation is not demonstrated. No evidence of tricuspid stenosis. Aortic Valve: Some fusion of right and non coronary cusps. The aortic valve is abnormal. There is moderate calcification of the aortic valve. There is moderate thickening of the aortic valve. Aortic valve regurgitation is mild. Aortic regurgitation PHT measures 1035 msec. Mild aortic stenosis is present. Aortic valve mean gradient measures 9.0 mmHg. Aortic valve peak gradient measures 13.7 mmHg. Aortic valve area, by VTI measures 1.64 cm. Pulmonic Valve: The pulmonic valve was normal in structure. Pulmonic valve regurgitation is not visualized. No evidence of pulmonic stenosis. Aorta: The aortic root is normal in size and structure and aortic dilatation noted. There is moderate dilatation of the ascending aorta, measuring 45 mm. Venous: The inferior vena cava is normal in size with greater than 50% respiratory variability, suggesting right atrial pressure of 3 mmHg. IAS/Shunts: The interatrial septum was not well visualized.  LEFT VENTRICLE PLAX 2D LVIDd:         4.80 cm  Diastology  LVIDs:         3.50 cm  LV e' medial:    3.53 cm/s LV PW:         1.20 cm  LV E/e' medial:  13.7 LV IVS:        1.20 cm  LV e' lateral:   4.88 cm/s LVOT diam:     2.10 cm  LV E/e' lateral: 9.9 LV SV:         75 LV SV Index:   39 LVOT Area:     3.46 cm  RIGHT VENTRICLE            IVC RV Basal diam:  2.50 cm    IVC diam: 1.45 cm RV S prime:     6.15 cm/s TAPSE (M-mode): 2.2 cm LEFT ATRIUM             Index       RIGHT ATRIUM           Index LA diam:        3.90 cm 2.04 cm/m  RA Area:     13.90 cm LA Vol (A2C):   75.8 ml 39.57 ml/m RA Volume:   25.80 ml  13.47 ml/m LA Vol (A4C):   50.3 ml 26.26 ml/m LA Biplane Vol: 66.0 ml 34.45 ml/m  AORTIC VALVE AV Area (Vmax):    1.50 cm AV Area (Vmean):   1.41 cm AV Area (VTI):     1.64 cm AV Vmax:           185.00 cm/s AV Vmean:          141.000 cm/s AV VTI:            0.455 m AV Peak Grad:      13.7 mmHg AV Mean Grad:      9.0 mmHg LVOT Vmax:         80.30 cm/s LVOT Vmean:        57.200 cm/s LVOT VTI:          0.216 m LVOT/AV VTI ratio: 0.47 AI PHT:            1035 msec  AORTA  Ao Root diam: 3.90 cm Ao Asc diam:  4.50 cm MITRAL VALVE MV Area (PHT): 1.61 cm    SHUNTS MV Decel Time: 470 msec    Systemic VTI:  0.22 m MV E velocity: 48.40 cm/s  Systemic Diam: 2.10 cm MV A velocity: 55.30 cm/s MV E/A ratio:  0.88 John Rouge MD Electronically signed by John Rouge MD Signature Date/Time: 11/11/2020/1:08:22 PM    Final    VAS [place] LOWER EXTREMITY VENOUS (DVT)  Result Date: 11/11/2020  Lower Venous DVT Study Indications: Stroke.  Comparison Study: 08/22/2018- negative bilateral lower extremity venous duplex Performing Technologist: Maudry Mayhew MHA, RDMS, RVT, RDCS  Examination Guidelines: A complete evaluation includes B-mode imaging, spectral Doppler, color Doppler, and power Doppler as needed of all accessible portions of each vessel. Bilateral testing is considered an integral part of a complete examination. Limited examinations for reoccurring indications may be  performed as noted. The reflux portion of the exam is performed with the patient in reverse Trendelenburg.  +---------+---------------+---------+-----------+----------+--------------+ RIGHT    CompressibilityPhasicitySpontaneityPropertiesThrombus Aging +---------+---------------+---------+-----------+----------+--------------+ CFV      Full           Yes      Yes                                 +---------+---------------+---------+-----------+----------+--------------+ SFJ      Full                                                        +---------+---------------+---------+-----------+----------+--------------+ FV Prox  Full                                                        +---------+---------------+---------+-----------+----------+--------------+ FV Mid   Full                                                        +---------+---------------+---------+-----------+----------+--------------+ FV DistalFull                                                        +---------+---------------+---------+-----------+----------+--------------+ PFV      Full                                                        +---------+---------------+---------+-----------+----------+--------------+ POP      Full           Yes      Yes                                 +---------+---------------+---------+-----------+----------+--------------+ PTV  Full                                                        +---------+---------------+---------+-----------+----------+--------------+ PERO     Full                                                        +---------+---------------+---------+-----------+----------+--------------+   +---------+---------------+---------+-----------+----------+--------------+ LEFT     CompressibilityPhasicitySpontaneityPropertiesThrombus Aging +---------+---------------+---------+-----------+----------+--------------+ CFV      Full            Yes      Yes                                 +---------+---------------+---------+-----------+----------+--------------+ SFJ      Full                                                        +---------+---------------+---------+-----------+----------+--------------+ FV Prox  Full                                                        +---------+---------------+---------+-----------+----------+--------------+ FV Mid   Full                                                        +---------+---------------+---------+-----------+----------+--------------+ FV DistalFull                                                        +---------+---------------+---------+-----------+----------+--------------+ PFV      Full                                                        +---------+---------------+---------+-----------+----------+--------------+ POP      Full           Yes      Yes                                 +---------+---------------+---------+-----------+----------+--------------+ PTV      Full                                                        +---------+---------------+---------+-----------+----------+--------------+  PERO     Full                                                        +---------+---------------+---------+-----------+----------+--------------+     Summary: RIGHT: - There is no evidence of deep vein thrombosis in the lower extremity.  - No cystic structure found in the popliteal fossa.  LEFT: - There is no evidence of deep vein thrombosis in the lower extremity.  - No cystic structure found in the popliteal fossa.  *See table(s) above for measurements and observations. Electronically signed by Servando Snare MD on 11/11/2020 at 2:56:52 PM.    Final     [facility] Clinician Interpretation: I have personally reviewed the radiological images as listed.  My interpretation, in the context of the patient's clinical presentation, is progressive  disease   Assessment/Plan Cerebrovascular accident (CVA) due to thrombosis of left middle cerebral artery [facility]) [I63.312]  Rmani Kellogg Risby is clinically stable today.  His workup for acute stroke did not demonstrate any clear etiology, but was able to essentially rule out cardiogenic or large atheromatous causes.  We agree with completing aspirin and plavix for 3 weeks, then transitioning to plavix monotherapy.  He will continue crestor 20mg  daily.  Blood pressure is controlled at this time.  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  We will repeat MRI brain in 4-6 to better characterize this lesion.  He is agreeable to additional radiosurgery if that is warranted.  He will also continue with PT for gait issues.  We ask that DARTANIAN KNAGGS return to clinic in 1-2 months following next brain MRI, or sooner as needed.  We spent twenty additional minutes teaching regarding the natural history, biology, and historical experience in the treatment of neurologic complications of cancer.   We appreciate the opportunity to participate in the care of Jacier W Griffee.   All questions were answered. The patient knows to call the clinic with any problems, questions or concerns. No barriers to learning were detected.  The total time spent in the encounter was 40 minutes and more than 50% was on counseling and review of test results   Ventura Sellers, MD Medical Director of Neuro-Oncology [facility] [place] 11/18/20 1:58 PM

## 2020-11-19 LAB — VITAMIN B1: Vitamin B1 (Thiamine): 131.7 nmol/L (ref 66.5–200.0)

## 2020-11-22 ENCOUNTER — Inpatient Hospital Stay: Payer: Medicare Other

## 2020-11-25 ENCOUNTER — Other Ambulatory Visit: Payer: Self-pay | Admitting: Radiation Therapy

## 2020-12-02 ENCOUNTER — Other Ambulatory Visit: Payer: Medicare Other | Admitting: Nurse Practitioner

## 2020-12-02 ENCOUNTER — Encounter: Payer: Self-pay | Admitting: Nurse Practitioner

## 2020-12-02 ENCOUNTER — Other Ambulatory Visit: Payer: Self-pay

## 2020-12-02 DIAGNOSIS — Z515 Encounter for palliative care: Secondary | ICD-10-CM

## 2020-12-02 DIAGNOSIS — R5381 Other malaise: Secondary | ICD-10-CM

## 2020-12-02 NOTE — Progress Notes (Signed)
Therapist, nutritional Palliative Care Consult Note Telephone: [phone]  Fax: [phone]    Date of encounter: 12/02/20 PATIENT NAME: [name] [address] [place] [address]   [phone] (home)  DOB: 30-Apr-1946 MRN: [id number] PRIMARY CARE PROVIDER:    Oval Linsey, MD,  [address] [place] [address] [phone]  RESPONSIBLE PARTY:    Contact Information    Name Relation Home Work Mobile   [place] Sister   [phone]   Bagheri,Richard Brother   [phone]     I met face to face with patient home. Palliative Care was asked to follow this patient by consultation request of  Oval Linsey, MD to address advance care planning and complex medical decision making. This is the initial visit.   ASSESSMENT AND PLAN / RECOMMENDATIONS:   Advance Care Planning/Goals of Care: Goals include to maximize quality of life and symptom management. Our advance care planning conversation included a discussion about:     The value and importance of advance care planning   Experiences with loved ones who have been seriously ill or have died   Exploration of personal, cultural or spiritual beliefs that might influence medical decisions   Exploration of goals of care in the event of a sudden injury or illness   Identification and preparation of a healthcare agent   Review and updating or creation of an  advance directive document .  Decision not to resuscitate or to de-escalate disease focused treatments due to poor prognosis.  CODE STATUS: DNR  Symptom Management/Plan:  1. ACP: Code status DNR; golden rod completed placed in vynca  2. Debility secondary to CVA and SCL, continue to encourage activity; continue physical therapy for strengthening, balance, gait  3. Palliative care encounter; Palliative care encounter; Palliative medicine team will continue to support patient, patient's family, and medical team. Visit consisted  of counseling and education dealing with the complex and emotionally intense issues of symptom management and palliative care in the setting of serious and potentially life-threatening illness  Follow up Palliative Care Visit: Palliative care will continue to follow for complex medical decision making, advance care planning, and clarification of goals. Return 6 weeks or prn.  I spent 75 minutes providing this consultation starting at 12:45pm. More than 50% of the time in this consultation was spent in counseling and care coordination.  PPS: 50%  HOSPICE ELIGIBILITY/DIAGNOSIS: TBD  Chief Complaint: Palliative initial consult for complex medical decision making  HISTORY OF PRESENT ILLNESS:  [name] is a 75 y.o. 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 Hospitalize 11/10/2020 to 11/11/2020 for routine MRI showed CVA with no focal deficits admitted for work up. CTA head and neck without any major stenosis. Echo with preserved ejection fracture. Hemoglobin A1 C 5.4. Saw Dr Barbaraann Cao Oncology 11/18/2020 see Oncology history. The plan is for continue physical therapy for gate issues. MRI did demonstrate possible new metastatic focus with right temporal lobe will repeat MRI brain four to six to better characterize lesion. He is agreeable to additional radiosurgery if warranted.I called Mr. Espino to confirm palliative care visit and confirm COVID negative screening. I arrived at Mr. Petros home. Mr. Rumple answered the door and his brother who he resides with was currently sleeping. Mr. Legan did not want  to wake him. We talked about purpose of palliative care visit. Mr. Charnley in agreement. We talked about how he has been feeling. Mr. Garcilazo endorses he feels like  he's doing well.  We talked about past medical history com the last time he was independent living on his own. We talked about his appetite is good. Mr. Fan endorses he is not losing weight in fact he gained 20 lbs. Mr. Perrier says he used to weigh 210 pounds and dropped down to 155 lbs with last weight being 170 lbs. We talked about symptoms of pain and shortness of breath which he denies. We talked about sleep and sleep patterns. Mr. Amrhein endorses that he's sleeping well. We talked about Mr. Gehret daily routine. Mr. Munyan endorses he gets up in the morning takes a shower, drives himself to a cafe to eat breakfast. Mr. Denault meets friends there every morning. Mr. Demeo then Verl Dicker spends time at his house. We talked about family dynamics. Mr. Corniel does have a sister that takes him to his doctors appointments if they are farther away than what he wants to drive. Mr. Sakuma does continue to reside with his brother. Mr. Pavao endorses that he is able to cook some food such as boil an egg. We talked about his functional ability as he is ambulatory no recent fall. Mr. Brozek is able to give himself a bath, get dressed, feed himself. 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 We talked about the hobbies that he liked. Mr. Okazaki talked a lot about golf for which he does miss playing. 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 Mr. Zywicki endorses he has been married and divorced twice. We talked about medical goals of care including aggressive versus conservative versus comfort care period Mr. Ferrelli endorses he wishes to be a do not resuscitate. Goldenrod form completed and placed in vynca. Mr. Hiemstra does endorse that he wishes to pursue any further treatment for his cancer. We talked at length about his cancer diagnosis, the tests that he is endured as well as a treatments in  side effects. We talked about quality of life. We talked about [facility] palliative care and plan of care period discussed that will follow up in four weeks if needed or sooner should he decline. Mr. Kuehl in agreement, appointment scheduled. Therapeutic listening and emotional support provided. Contact information. Mr. Pindell talked about allowing me to contact his brother or sister and he declined. Mr. Klemann endorses that he wishes to update them about the palliative care visit. Encouraged Mr. Shisler to give his brother and sister pallative phone number for any questions about visit. Mr. Sakata in agreement.  History obtained from review of EMR, discussion with primary team, and interview with  Mr. Vuncannon. I reviewed available labs, medications, imaging, studies and related documents from the EMR.  Records reviewed and summarized above.   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[place])  08/22/2018 Initial Diagnosis   Small cell carcinoma of lung, right ([place])   08/23/2018 - 11/01/2018 Chemotherapy   The patient had palonosetron (ALOXI) injection 0.25 mg, 0.25 mg, Intravenous,  Once, 4 of 4 cycles Administration: 0.25 mg (08/23/2018), 0.25 mg (09/16/2018), 0.25 mg (10/07/2018), 0.25 mg (10/28/2018) pegfilgrastim-cbqv (UDENYCA) injection 6 mg, 6 mg, Subcutaneous,  Once, 4 of 4 cycles Administration: 6 mg (08/27/2018), 6 mg (09/20/2018), 6 mg (10/11/2018), 6 mg (11/01/2018) CARBOplatin (PARAPLATIN) 560 mg in sodium chloride 0.9 % 250 mL chemo infusion, 560 mg (100 % of original dose 564 mg), Intravenous,  Once, 4 of 4 cycles Dose modification:   (original dose 564 mg, Cycle 1),   (original dose 523.5 mg, Cycle 3),   (original dose 523.5 mg, Cycle 4) Administration: 560 mg (08/23/2018), 560 mg (09/16/2018), 520 mg (10/07/2018), 510 mg (10/28/2018) etoposide (VEPESID) 220 mg in sodium chloride 0.9 % 1,000 mL chemo infusion, 100 mg/m2  = 220 mg, Intravenous,  Once, 4 of 4 cycles Administration: 220 mg (08/23/2018), 200 mg (09/16/2018), 200 mg (09/17/2018), 200 mg (09/18/2018), 200 mg (10/07/2018), 200 mg (10/08/2018), 200 mg (10/09/2018), 200 mg (10/28/2018), 200 mg (10/29/2018), 200 mg (10/30/2018) fosaprepitant (EMEND) 150 mg, dexamethasone (DECADRON) 12 mg in sodium chloride 0.9 % 145 mL IVPB, , Intravenous,  Once, 4 of 4 cycles Administration:  (08/23/2018),  (09/16/2018),  (10/07/2018),  (10/28/2018) durvalumab (IMFINZI) 1,500 mg in sodium chloride 0.9 % 100 mL chemo infusion, 1,500 mg (100 % of original dose 1,500 mg), Intravenous,  Once, 3 of 3 cycles Dose modification: 1,500 mg (original dose 1,500 mg, Cycle 2, Reason: Provider Judgment) Administration: 1,500 mg (09/16/2018), 1,500 mg (10/07/2018), 1,500 mg (10/28/2018)  for chemotherapy treatment.    Small cell carcinoma of lung, right ([place])  08/23/2018 - 11/01/2018 Chemotherapy   The patient had palonosetron (ALOXI) injection 0.25 mg, 0.25 mg, Intravenous,  Once, 4 of 4 cycles Administration: 0.25 mg (08/23/2018), 0.25 mg (09/16/2018), 0.25 mg (10/07/2018), 0.25 mg (10/28/2018) pegfilgrastim-cbqv (UDENYCA) injection 6 mg, 6 mg, Subcutaneous, Once, 4 of 4 cycles Administration: 6 mg (08/27/2018), 6 mg (09/20/2018), 6 mg (10/11/2018), 6 mg (11/01/2018) CARBOplatin (PARAPLATIN) 560 mg in sodium chloride 0.9 % 250 mL chemo infusion, 560 mg (100 % of original dose 564 mg), Intravenous,  Once, 4 of 4 cycles Dose modification:   (original dose 564 mg, Cycle 1),   (original dose 523.5 mg, Cycle 3),   (original dose 523.5 mg, Cycle 4) Administration: 560 mg (08/23/2018), 560 mg (09/16/2018), 520 mg (10/07/2018), 510 mg (10/28/2018) etoposide (VEPESID) 220 mg in sodium chloride 0.9 % 1,000 mL chemo infusion, 100 mg/m2 = 220 mg, Intravenous,  Once, 4 of 4 cycles Administration: 220 mg (08/23/2018), 200 mg (09/16/2018), 200 mg (09/17/2018), 200 mg (09/18/2018), 200 mg (10/07/2018), 200 mg (10/08/2018), 200 mg (10/09/2018), 200  mg (10/28/2018), 200 mg (10/29/2018), 200 mg (10/30/2018) fosaprepitant (EMEND) 150 mg, dexamethasone (DECADRON) 12 mg in sodium chloride 0.9 % 145 mL IVPB, , Intravenous,  Once, 4 of 4 cycles Administration:  (08/23/2018),  (09/16/2018),  (10/07/2018),  (10/28/2018) durvalumab (IMFINZI) 1,500 mg in sodium chloride 0.9 % 100 mL chemo infusion, 1,500 mg (100 % of original dose 1,500 mg), Intravenous,  Once, 3 of 3 cycles Dose modification: 1,500 mg (original dose 1,500 mg, Cycle 2, Reason: Provider Judgment) Administration: 1,500 mg (09/16/2018), 1,500 mg (10/07/2018), 1,500 mg (10/28/2018)  for chemotherapy treatment.    08/26/2018 Initial Diagnosis   Small cell carcinoma of lung, right ([place])   11/20/2018 -  Chemotherapy   The patient had durvalumab (IMFINZI) 1,500 mg in sodium chloride 0.9 % 100 mL chemo infusion, 1,500 mg (100 % of original dose 1,500 mg), Intravenous,  Once, 4 of 9 cycles Dose modification: 1,500 mg (original dose 1,500 mg, Cycle 1, Reason: Other (see comments)) Administration: 1,500 mg (11/20/2018), 1,500 mg (12/19/2018), 1,500 mg (01/16/2019), 1,500 mg (  02/13/2019)  for chemotherapy treatment.     CNS Oncologic History 09/13/18: Completes WBRT    ROS Full 14 system review of systems performed and negative with exception of: as per HPI.  Physical Exam: Constitutional: NAD General: frail appearing, pleasant male EYES:  lids intact ENMT: oral mucous membranes moist CV: S1S2, RRR, no LE edema Pulmonary: LCTA, no increased work of breathing, + cough, room air MSK: moves all extremities, ambulatory Skin: warm and dry, no rashes or wounds on visible skin Neuro:  + generalized weakness,  no cognitive impairment Psych: non-anxious affect, A and O x 3  CURRENT PROBLEM LIST:  Patient Active Problem List   Diagnosis Date Noted  . Stroke (cerebrum) ([place]) 11/10/2020  . Weak 09/24/2020  . Ambulatory dysfunction 09/24/2020  . Other pulmonary embolism without acute cor pulmonale  ([place]) 01/19/2020  . Chronic obstructive pulmonary disease ([place]) 01/19/2020  . Type 2 diabetes mellitus without complication ([place]) 16/83/7290  . Goals of care, counseling/discussion 09/02/2018  . Brain metastasis ([place]) 08/29/2018  . Small cell carcinoma of lung, right ([place])   . 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 DNR (do not resuscitate) discussion 08/22/2018  . 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 Thoracic ascending aortic aneurysm ([place]) 08/21/2018  . Lymphadenopathy 08/21/2018  . Medial meniscus tear   . Primary osteoarthritis of knee   . Synovial plica of knee   . Colitis 01/25/2011  . Colon cancer screening 01/25/2011   PAST MEDICAL HISTORY:  Active Ambulatory Problems    Diagnosis Date Noted  . Colitis 01/25/2011  . Colon cancer screening 01/25/2011  . Medial meniscus tear   . Primary osteoarthritis of knee   . Synovial plica of knee   . 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 Thoracic ascending aortic aneurysm ([place]) 08/21/2018  . Lymphadenopathy 08/21/2018  . 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 DNR (do not resuscitate) discussion 08/22/2018  . Small cell carcinoma of lung, right ([place])   . Brain metastasis ([place]) 08/29/2018  . Goals of care, counseling/discussion 09/02/2018  . Other pulmonary embolism without acute cor pulmonale ([place]) 01/19/2020  . Chronic obstructive pulmonary disease ([place]) 01/19/2020  . Type 2 diabetes mellitus without complication ([place]) 21/06/5519  . Weak 09/24/2020  . Ambulatory dysfunction 09/24/2020  . Stroke (cerebrum) ([place]) 11/10/2020   Resolved Ambulatory Problems    Diagnosis Date Noted  . No Resolved Ambulatory Problems   Past Medical History:  Diagnosis Date  . Actinic keratosis   . Arthritis   . COPD (chronic obstructive pulmonary disease) ([place])   . Diabetes mellitus without complication ([place])   . History of kidney stones   . Hyperlipemia    . Hypertension   . NSAID long-term use NAPROXEN FOR OA  . SCL Ca dx'd 08/2018   SOCIAL HX:  Social History   Tobacco Use  . Smoking status: Current Some Day Smoker    Packs/day: 1.00    Years: 50.00    Pack years: 50.00    Types: Cigarettes    Last attempt to quit: 08/07/2018    Years since quitting: 2.3  . Smokeless tobacco: Never Used  Substance Use Topics  . Alcohol use: Yes    Comment: once a month   FAMILY HX:  Family History  Problem Relation Age of Onset  . Cancer Mother        lung  . Cancer Father        lung and liver  .  Colon polyps Neg Hx   . Colon cancer Neg Hx     reviewed  ALLERGIES: No Known Allergies   PERTINENT MEDICATIONS:  Outpatient Encounter Medications as of 12/02/2020  Medication Sig  . albuterol (PROVENTIL HFA;VENTOLIN HFA) 108 (90 Base) MCG/ACT inhaler Inhale 2 puffs into the lungs every 6 (six) hours as needed for wheezing or shortness of breath.  Marland Kitchen aspirin 81 MG tablet Take 1 tablet (81 mg total) by mouth daily for 21 days. Take for [redacted] weeks along with Plavix-after 3 weeks stop aspirin and continue with Plavix.  . Cholecalciferol 50 MCG (2000 UT) TBDP Take 1 tablet by mouth daily.  . clopidogrel (PLAVIX) 75 MG tablet Take 1 tablet (75 mg total) by mouth daily.  Marland Kitchen levothyroxine (SYNTHROID) 150 MCG tablet Take 150 mcg by mouth daily.  . Multiple Vitamin (MULTIVITAMIN WITH MINERALS) TABS tablet Take 1 tablet by mouth daily.  . rosuvastatin (CRESTOR) 20 MG tablet Take 1 tablet (20 mg total) by mouth daily.  Marland Kitchen thiamine 100 MG tablet Take 1 tablet (100 mg total) by mouth daily.  . vitamin B-12 1000 MCG tablet Take 1 tablet (1,000 mcg total) by mouth daily.   No facility-administered encounter medications on file as of 12/02/2020.   Questions and concerns were addressed. The patient was encouraged to call with questions and/or concerns. My business card was provided. Provided general support and encouragement, no other unmet needs identified  Thank  you for the opportunity to participate in the care of Mr. Lamorte.  The palliative care team will continue to follow. Please call our office at [phone] if we can be of additional assistance.   This chart was dictated using voice recognition software. Despite best efforts to proofread, errors can occur which can change the documentation meaning.  Mykael Batz Z Darriel Sinquefield, NP , MSN,  ACHPN  COVID-19 PATIENT SCREENING TOOL Asked and negative response unless otherwise noted:   Have you had symptoms of covid, tested positive or been in contact with someone with symptoms/positive test in the past 5-10 days? NO

## 2020-12-24 ENCOUNTER — Other Ambulatory Visit: Payer: Self-pay

## 2020-12-24 ENCOUNTER — Ambulatory Visit
Admission: RE | Admit: 2020-12-24 | Discharge: 2020-12-24 | Disposition: A | Discharge status: Home / Self Care | Payer: Medicare Other | Source: Ambulatory Visit | Attending: Internal Medicine | Admitting: Internal Medicine

## 2020-12-24 ENCOUNTER — Inpatient Hospital Stay (HOSPITAL_COMMUNITY): Payer: Medicare Other

## 2020-12-24 ENCOUNTER — Ambulatory Visit (HOSPITAL_COMMUNITY)
Admission: RE | Admit: 2020-12-24 | Discharge: 2020-12-24 | Disposition: A | Discharge status: Home / Self Care | Payer: Medicare Other | Source: Ambulatory Visit | Attending: Hematology | Admitting: Hematology

## 2020-12-24 DIAGNOSIS — C3491 Malignant neoplasm of unspecified part of right bronchus or lung: Secondary | ICD-10-CM | POA: Insufficient documentation

## 2020-12-24 DIAGNOSIS — C7931 Secondary malignant neoplasm of brain: Secondary | ICD-10-CM

## 2020-12-24 LAB — CBC WITH DIFFERENTIAL/PLATELET
Abs Immature Granulocytes: 0.06 10*3/uL (ref 0.00–0.07)
Basophils Absolute: 0 10*3/uL (ref 0.0–0.1)
Basophils Relative: 0 %
Eosinophils Absolute: 0.1 10*3/uL (ref 0.0–0.5)
Eosinophils Relative: 1 %
HCT: 41.7 % (ref 39.0–52.0)
Hemoglobin: 13.3 g/dL (ref 13.0–17.0)
Immature Granulocytes: 1 %
Lymphocytes Relative: 17 %
Lymphs Abs: 1.9 10*3/uL (ref 0.7–4.0)
MCH: 31.2 pg (ref 26.0–34.0)
MCHC: 31.9 g/dL (ref 30.0–36.0)
MCV: 97.9 fL (ref 80.0–100.0)
Monocytes Absolute: 0.8 10*3/uL (ref 0.1–1.0)
Monocytes Relative: 7 %
Neutro Abs: 8.2 10*3/uL — ABNORMAL HIGH (ref 1.7–7.7)
Neutrophils Relative %: 74 %
Platelets: 343 10*3/uL (ref 150–400)
RBC: 4.26 MIL/uL (ref 4.22–5.81)
RDW: 14.3 % (ref 11.5–15.5)
WBC: 11 10*3/uL — ABNORMAL HIGH (ref 4.0–10.5)
nRBC: 0 % (ref 0.0–0.2)

## 2020-12-24 LAB — COMPREHENSIVE METABOLIC PANEL
ALT: 12 U/L (ref 0–44)
AST: 18 U/L (ref 15–41)
Albumin: 3.7 g/dL (ref 3.5–5.0)
Alkaline Phosphatase: 75 U/L (ref 38–126)
Anion gap: 9 (ref 5–15)
BUN: 19 mg/dL (ref 8–23)
CO2: 26 mmol/L (ref 22–32)
Calcium: 9.2 mg/dL (ref 8.9–10.3)
Chloride: 102 mmol/L (ref 98–111)
Creatinine, Ser: 0.78 mg/dL (ref 0.61–1.24)
GFR, Estimated: >60 mL/min (ref >60)
Glucose, Bld: 98 mg/dL (ref 70–99)
Potassium: 4 mmol/L (ref 3.5–5.1)
Sodium: 137 mmol/L (ref 135–145)
Total Bilirubin: 0.8 mg/dL (ref 0.3–1.2)
Total Protein: 7.2 g/dL (ref 6.5–8.1)

## 2020-12-24 MED ORDER — GADOBENATE DIMEGLUMINE 529 MG/ML IV SOLN
20.0000 mL | Freq: Once | INTRAVENOUS | Status: AC | PRN
  Administered 2020-12-24: 20 mL via INTRAVENOUS

## 2020-12-24 MED ORDER — IOHEXOL 300 MG/ML  SOLN
100.0000 mL | Freq: Once | INTRAMUSCULAR | Status: AC | PRN
  Administered 2020-12-24: 100 mL via INTRAVENOUS

## 2020-12-27 ENCOUNTER — Inpatient Hospital Stay: Payer: Medicare Other | Attending: Internal Medicine

## 2020-12-27 DIAGNOSIS — Z9221 Personal history of antineoplastic chemotherapy: Secondary | ICD-10-CM | POA: Insufficient documentation

## 2020-12-27 DIAGNOSIS — R413 Other amnesia: Secondary | ICD-10-CM | POA: Insufficient documentation

## 2020-12-27 DIAGNOSIS — Z7982 Long term (current) use of aspirin: Secondary | ICD-10-CM | POA: Insufficient documentation

## 2020-12-27 DIAGNOSIS — F17210 Nicotine dependence, cigarettes, uncomplicated: Secondary | ICD-10-CM | POA: Insufficient documentation

## 2020-12-27 DIAGNOSIS — Z7901 Long term (current) use of anticoagulants: Secondary | ICD-10-CM | POA: Insufficient documentation

## 2020-12-27 DIAGNOSIS — I10 Essential (primary) hypertension: Secondary | ICD-10-CM | POA: Insufficient documentation

## 2020-12-27 DIAGNOSIS — R269 Unspecified abnormalities of gait and mobility: Secondary | ICD-10-CM | POA: Insufficient documentation

## 2020-12-27 DIAGNOSIS — Z79899 Other long term (current) drug therapy: Secondary | ICD-10-CM | POA: Insufficient documentation

## 2020-12-27 DIAGNOSIS — C7931 Secondary malignant neoplasm of brain: Secondary | ICD-10-CM | POA: Insufficient documentation

## 2020-12-27 DIAGNOSIS — C771 Secondary and unspecified malignant neoplasm of intrathoracic lymph nodes: Secondary | ICD-10-CM | POA: Insufficient documentation

## 2020-12-27 DIAGNOSIS — C3491 Malignant neoplasm of unspecified part of right bronchus or lung: Secondary | ICD-10-CM | POA: Insufficient documentation

## 2020-12-27 DIAGNOSIS — J449 Chronic obstructive pulmonary disease, unspecified: Secondary | ICD-10-CM | POA: Insufficient documentation

## 2020-12-27 DIAGNOSIS — E119 Type 2 diabetes mellitus without complications: Secondary | ICD-10-CM | POA: Insufficient documentation

## 2020-12-28 NOTE — Progress Notes (Incomplete)
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 Carboplatin, VP-16 and durvalumab x 4 cycles from 08/23/2018 to 10/28/2018. 2. Durvalumab from 11/20/2018 to 02/13/2019.  NGS Results: not done  CURRENT THERAPY: surveillance  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[place])  08/22/2018 Initial Diagnosis   Small cell carcinoma of lung, right ([place])   08/23/2018 - 11/01/2018 Chemotherapy   The patient had palonosetron (ALOXI) injection 0.25 mg, 0.25 mg, Intravenous,  Once, 4 of 4 cycles Administration: 0.25 mg (08/23/2018), 0.25 mg (09/16/2018), 0.25 mg (10/07/2018), 0.25 mg (10/28/2018) pegfilgrastim-cbqv (UDENYCA) injection 6 mg, 6 mg, Subcutaneous, Once, 4 of 4 cycles Administration: 6 mg (08/27/2018), 6 mg (09/20/2018), 6 mg (10/11/2018), 6 mg (11/01/2018) CARBOplatin (PARAPLATIN) 560 mg in sodium chloride 0.9 % 250 mL chemo infusion, 560 mg (100 % of original dose 564 mg), Intravenous,  Once, 4 of 4 cycles Dose modification:   (original dose 564 mg, Cycle 1),   (original dose 523.5 mg, Cycle 3),   (original dose 523.5 mg, Cycle 4) Administration: 560 mg (08/23/2018), 560 mg (09/16/2018), 520 mg (10/07/2018), 510 mg (10/28/2018) etoposide (VEPESID) 220 mg in sodium chloride 0.9 % 1,000 mL chemo infusion, 100 mg/m2 = 220 mg, Intravenous,  Once, 4 of 4 cycles Administration: 220 mg (08/23/2018), 200 mg (09/16/2018), 200 mg (09/17/2018), 200 mg (09/18/2018), 200 mg (10/07/2018), 200 mg (10/08/2018), 200 mg (10/09/2018), 200 mg (10/28/2018), 200 mg (10/29/2018), 200 mg (10/30/2018) fosaprepitant (EMEND) 150 mg, dexamethasone (DECADRON) 12 mg in sodium chloride 0.9 % 145 mL IVPB, ,  Intravenous,  Once, 4 of 4 cycles Administration:  (08/23/2018),  (09/16/2018),  (10/07/2018),  (10/28/2018) durvalumab (IMFINZI) 1,500 mg in sodium chloride 0.9 % 100 mL chemo infusion, 1,500 mg (100 % of original dose 1,500 mg), Intravenous,  Once, 3 of 3 cycles Dose modification: 1,500 mg (original dose 1,500 mg, Cycle 2, Reason: Provider Judgment) Administration: 1,500 mg (09/16/2018), 1,500 mg (10/07/2018), 1,500 mg (10/28/2018)  for chemotherapy treatment.    Small cell carcinoma of lung, right ([facility] [place])  08/23/2018 - 11/01/2018 Chemotherapy   The patient had palonosetron (ALOXI) injection 0.25 mg, 0.25 mg, Intravenous,  Once, 4 of 4 cycles Administration: 0.25 mg (08/23/2018), 0.25 mg (09/16/2018), 0.25 mg (10/07/2018), 0.25 mg (10/28/2018) pegfilgrastim-cbqv (UDENYCA) injection 6 mg, 6 mg, Subcutaneous, Once, 4 of 4 cycles Administration: 6 mg (08/27/2018), 6 mg (09/20/2018), 6 mg (10/11/2018), 6 mg (11/01/2018) CARBOplatin (PARAPLATIN) 560 mg in sodium chloride 0.9 % 250 mL chemo infusion, 560 mg (100 % of original dose 564 mg), Intravenous,  Once, 4 of 4 cycles Dose modification:   (original dose 564 mg, Cycle 1),   (original dose 523.5 mg, Cycle 3),   (original dose 523.5 mg, Cycle 4) Administration: 560 mg (08/23/2018), 560 mg (09/16/2018), 520 mg (10/07/2018), 510 mg (10/28/2018) etoposide (VEPESID) 220 mg in sodium chloride 0.9 % 1,000 mL chemo infusion, 100 mg/m2 = 220 mg, Intravenous,  Once, 4 of 4 cycles Administration: 220 mg (08/23/2018), 200 mg (09/16/2018), 200 mg (09/17/2018), 200 mg (09/18/2018), 200 mg (10/07/2018), 200 mg (10/08/2018), 200 mg (10/09/2018), 200 mg (10/28/2018), 200 mg (10/29/2018), 200 mg (10/30/2018) fosaprepitant (EMEND) 150 mg, dexamethasone (DECADRON) 12 mg in sodium chloride 0.9 %  145 mL IVPB, , Intravenous,  Once, 4 of 4 cycles Administration:  (08/23/2018),  (09/16/2018),  (10/07/2018),  (10/28/2018) durvalumab (IMFINZI) 1,500 mg in sodium chloride 0.9 % 100 mL chemo infusion, 1,500 mg (100 % of  original dose 1,500 mg), Intravenous,  Once, 3 of 3 cycles Dose modification: 1,500 mg (original dose 1,500 mg, Cycle 2, Reason: Provider Judgment) Administration: 1,500 mg (09/16/2018), 1,500 mg (10/07/2018), 1,500 mg (10/28/2018)  for chemotherapy treatment.    08/26/2018 Initial Diagnosis   Small cell carcinoma of lung, right ([place])   11/20/2018 -  Chemotherapy   The patient had durvalumab (IMFINZI) 1,500 mg in sodium chloride 0.9 % 100 mL chemo infusion, 1,500 mg (100 % of original dose 1,500 mg), Intravenous,  Once, 4 of 9 cycles Dose modification: 1,500 mg (original dose 1,500 mg, Cycle 1, Reason: Other (see comments)) Administration: 1,500 mg (11/20/2018), 1,500 mg (12/19/2018), 1,500 mg (01/16/2019), 1,500 mg (02/13/2019)  for chemotherapy treatment.      CANCER STAGING: Cancer Staging No matching staging information was found for the patient.  INTERVAL HISTORY:  Mr. TEJ MURDAUGH, a 75 y.o. 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 Taurus was last seen on 10/27/2020.    REVIEW OF SYSTEMS:  Review of Systems  All other systems reviewed and are negative.   PAST MEDICAL/SURGICAL HISTORY:  Past Medical History:  Diagnosis Date  . Actinic keratosis   . Arthritis   . Colitis MAY 2012    CT ABD/PELVIS HEP FLEXURE  . COPD (chronic obstructive pulmonary disease) ([facility])   . Diabetes mellitus without complication ([facility])   . History of kidney stones   . Hyperlipemia   . Hypertension   . NSAID long-term use NAPROXEN FOR OA  . SCL Ca dx'd 08/2018   Past Surgical History:  Procedure Laterality Date  . BACK SURGERY     spinal stenosis  . BASAL CELL CARCINOMA EXCISION  FACE, arms feet, leg  . CHOLECYSTECTOMY  JUNE 2011 MJ   STONES, PANCREATITIS  . KNEE ARTHROSCOPY WITH MEDIAL MENISECTOMY Right 03/24/2015   Procedure: KNEE ARTHROSCOPY WITH MEDIAL MENISECTOMY;  Surgeon: Carole Civil, MD;  Location: [facility];  Service:  Orthopedics;  Laterality: Right;  . KNEE SURGERY Left LEFT   arthroscopy  . LITHOTRIPSY  80s  . PORTACATH PLACEMENT Left 09/11/2018   Procedure: INSERTION PORT-A-CATH;  Surgeon: Virl Cagey, MD;  Location: [facility];  Service: General;  Laterality: Left;  . SPINE SURGERY      SOCIAL HISTORY:  Social History   Socioeconomic History  . Marital status: Divorced    Spouse name: Not on file  . Number of children: 0  . Years of education: Not on file  . Highest education level: Not on file  Occupational History  . Occupation: [employer]   . Occupation: Manufacturing engineer   Tobacco Use  . Smoking status: Current Some Day Smoker    Packs/day: 1.00    Years: 50.00    Pack years: 50.00    Types: Cigarettes    Last attempt to quit: 08/07/2018    Years since quitting: 2.3  . Smokeless tobacco: Never Used  Substance and Sexual Activity  . Alcohol use: Yes    Comment: once a month  . Drug use: No  . Sexual activity: Never    Birth control/protection: None  Other Topics Concern  . Not on file  Social History Narrative   Over 500 jumps ([place]),  Combat Diver. Used to work for [employer].    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   Social Determinants of Health   Financial Resource Strain: Not on file  Food Insecurity: Not on file  Transportation Needs: Not on file  Physical Activity: Not on file  Stress: Not on file  Social Connections: Not on file  Intimate Partner Violence: Not on file    FAMILY HISTORY:  Family History  Problem Relation Age of Onset  . Cancer Mother        lung  . Cancer Father        lung and liver  . Colon polyps Neg Hx   . Colon cancer Neg Hx     CURRENT MEDICATIONS:  Current Outpatient Medications  Medication Sig Dispense Refill  . albuterol (PROVENTIL HFA;VENTOLIN HFA) 108 (90 Base) MCG/ACT inhaler Inhale 2 puffs into the lungs every 6 (six) hours as needed for wheezing or shortness of breath.    . Cholecalciferol 50 MCG (2000 UT) TBDP Take 1 tablet  by mouth daily.    . clopidogrel (PLAVIX) 75 MG tablet Take 1 tablet (75 mg total) by mouth daily. 30 tablet 1  . levothyroxine (SYNTHROID) 150 MCG tablet Take 150 mcg by mouth daily.    . Multiple Vitamin (MULTIVITAMIN WITH MINERALS) TABS tablet Take 1 tablet by mouth daily.    . rosuvastatin (CRESTOR) 20 MG tablet Take 1 tablet (20 mg total) by mouth daily. 30 tablet 0  . thiamine 100 MG tablet Take 1 tablet (100 mg total) by mouth daily. 30 tablet 2  . vitamin B-12 1000 MCG tablet Take 1 tablet (1,000 mcg total) by mouth daily. 30 tablet 1   No current facility-administered medications for this visit.    ALLERGIES:  No Known Allergies  PHYSICAL EXAM:  Performance status (ECOG): 1 - Symptomatic but completely ambulatory  There were no vitals filed for this visit. Wt Readings from Last 3 Encounters:  11/18/20 176 lb 1.6 oz (79.9 kg)  10/28/20 159 lb 9.6 oz (72.4 kg)  10/27/20 176 lb 12.8 oz (80.2 kg)   Physical Exam Vitals reviewed.  Constitutional:      Appearance: Normal appearance.  Cardiovascular:     Rate and Rhythm: Normal rate and regular rhythm.     Pulses: Normal pulses.     Heart sounds: Normal heart sounds.  Pulmonary:     Effort: Pulmonary effort is normal.     Breath sounds: Normal breath sounds.  Neurological:     General: No focal deficit present.     Mental Status: He is alert and oriented to person, place, and time.  Psychiatric:        Mood and Affect: Mood normal.        Behavior: Behavior normal.      LABORATORY DATA:  I have reviewed the labs as listed.  CBC Latest Ref Rng & Units 12/24/2020 11/11/2020 11/10/2020  WBC 4.0 - 10.5 K/uL 11.0(H) 5.5 -  Hemoglobin 13.0 - 17.0 g/dL 13.3 13.0 13.3  Hematocrit 39.0 - 52.0 % 41.7 38.6(L) 39.9  Platelets 150 - 400 K/uL 343 216 -   CMP Latest Ref Rng & Units 12/24/2020 11/11/2020 11/10/2020  Glucose 70 - 99 mg/dL 98 98 71  BUN 8 - 23 mg/dL 19 13 11   Creatinine 0.61 - 1.24 mg/dL 0.78 1.07 0.60(L)  Sodium 135 -  145 mmol/L 137 138 144  Potassium 3.5 - 5.1 mmol/L 4.0 4.0 3.0(L)  Chloride 98 - 111 mmol/L 102  103 110  CO2 22 - 32 mmol/L 26 30 -  Calcium 8.9 - 10.3 mg/dL 9.2 9.2 -  Total Protein 6.5 - 8.1 g/dL 7.2 - -  Total Bilirubin 0.3 - 1.2 mg/dL 0.8 - -  Alkaline Phos 38 - 126 U/L 75 - -  AST 15 - 41 U/L 18 - -  ALT 0 - 44 U/L 12 - -    DIAGNOSTIC IMAGING:  I have independently reviewed the scans and discussed with the patient. MR BRAIN W WO CONTRAST  Result Date: 12/26/2020 CLINICAL DATA:  Small cell lung cancer. Brain metastases. Assess treatment results. EXAM: MRI HEAD WITHOUT AND WITH CONTRAST TECHNIQUE: Multiplanar, multiecho pulse sequences of the brain and surrounding structures were obtained without and with intravenous contrast. CONTRAST:  55mL MULTIHANCE GADOBENATE DIMEGLUMINE 529 MG/ML IV SOLN COMPARISON:  11/12/2020 and 06/16/2020 FINDINGS: Brain: Expected evolution of the left lentiform nucleus infarcts noted. Confluent T2 hyperintensities throughout the white matter again noted bilaterally. Stable atrophy. The subtle area of enhancement noted in the right temporal lobe on the prior study is not present on today's study. However, focal area of cortical or more likely intravascular enhancement is present in the right temporal lobe, best seen on image 75-77 of series 12 and image 24 of series 14. No other new foci of enhancement are present to suggest metastatic disease. The ventricles are proportionate to the degree of atrophy. No significant extraaxial fluid collection is present. T2 changes in the left mid brain and pons are consistent with well layering degeneration associated with the recent basal ganglia infarct White matter changes are noted in the cerebellum bilaterally. Vascular: Flow is present in the major intracranial arteries. Skull and upper cervical spine: Craniocervical junction is normal. Flow is present in the vertebral arteries bilaterally. Visualized intracranial contents are  normal. Sinuses/Orbits: The left maxillary sinus is opacified. Fluid level is present. Minimal aeration remains. Circumferential mucosal thickening is present in the inferior right maxillary sinus with mucosal thickening scattered throughout the ethmoid air cells. Sphenoid sinus is clear. Fluid is present in the left greater than right mastoid air cells. No obstructing nasopharyngeal lesion is present. The globes and orbits are within normal limits. IMPRESSION: 1. Expected evolution of left lentiform nucleus infarct. No acute infarcts. 2. Previously seen area of enhancement in the right temporal lobe is not present on today's study. 3. New area of cortical or more likely intravascular enhancement in the right temporal lobe present. This could be related to subacute or early chronic ischemia. Metastasis is considered less likely. Recommend continued surveillance with attention to this area. 4. No other new foci of enhancement to suggest metastatic disease. 5. Stable atrophy and diffuse white matter disease. 6. Acute left maxillary sinus disease. 7. Left greater than right mastoid effusions. No obstructing nasopharyngeal lesion is present. Electronically Signed   By: San Morelle M.D.   On: 12/26/2020 11:42   CT CHEST ABDOMEN PELVIS W CONTRAST  Result Date: 12/26/2020 CLINICAL DATA:  Follow-up metastatic small cell carcinoma of right lung. Status post chemotherapy and immunotherapy. EXAM: CT CHEST, ABDOMEN, AND PELVIS WITH CONTRAST TECHNIQUE: Multidetector CT imaging of the chest, abdomen and pelvis was performed following the standard protocol during bolus administration of intravenous contrast. CONTRAST:  126mL OMNIPAQUE IOHEXOL 300 MG/ML  SOLN COMPARISON:  07/14/2020 FINDINGS: CT CHEST FINDINGS Cardiovascular: No acute findings. Stable 4.3 cm ascending thoracic aortic aneurysm. Aortic and coronary atherosclerotic calcification noted. Tiny epicardial cyst in the right cardiophrenic angle has  significantly decreased in  size since previous study. No evidence of pericardial effusion. Mediastinum/Lymph Nodes: No masses or pathologically enlarged lymph nodes identified. Lungs/Pleura: Stable post radiation changes in the paramediastinal lung zones bilaterally. No suspicious pulmonary nodules or masses identified. No evidence of infiltrate or pleural effusion. Musculoskeletal:  No suspicious bone lesions identified. CT ABDOMEN AND PELVIS FINDINGS Hepatobiliary: No masses identified. Prior cholecystectomy. No evidence of biliary obstruction. Pancreas:  No mass or inflammatory changes. Spleen:  Within normal limits in size and appearance. Adrenals/Urinary tract: Stable 1.3 cm low-attenuation left adrenal nodule, consistent with benign adenoma. Small left renal cysts remains stable. No renal masses are identified. A few punctate renal calculi are seen bilaterally, however there is no evidence of ureteral calculi or hydronephrosis. Stable diffuse bladder wall thickening is seen, likely due to chronic bladder outlet obstruction given enlarged prostate. 7 mm calculus again seen along the left posterior bladder wall. Stomach/Bowel: No evidence of obstruction, inflammatory process, or abnormal fluid collections. Vascular/Lymphatic: No pathologically enlarged lymph nodes identified. No acute vascular findings. Aortic atherosclerotic calcification noted. Reproductive: Stable moderately enlarged prostate gland with mass effect on bladder base. Other:  None. Musculoskeletal:  No suspicious bone lesions identified. IMPRESSION: Stable exam. No evidence of recurrent or metastatic carcinoma within the chest, abdomen, or pelvis. Stable enlarged prostate and findings of chronic bladder outlet obstruction. Bilateral nephrolithiasis and small bladder calculus. No evidence of ureteral calculi or hydronephrosis. Stable 4.3 cm ascending thoracic aortic aneurysm. Recommend continued annual imaging followup by CT. This recommendation  follows 2010 ACCF/AHA/AATS/ACR/ASA/SCA/SCAI/SIR/STS/SVM Guidelines for the Diagnosis and Management of Patients with Thoracic Aortic Disease. Circulation. 2010; 121: D664-Q034. Aortic aneurysm NOS (ICD10-I71.9) Stable small benign adrenal adenoma. Aortic Atherosclerosis (ICD10-I70.0). Electronically Signed   By: Marlaine Hind M.D.   On: 12/26/2020 17:02     ASSESSMENT:  1. Extensive stage small cell lung cancer: -4 cycles of carboplatin, VP-16 and durvalumab from 08/23/2018 through 10/28/2018. -Durvalumab maintenance last dose on 02/13/2019, subsequent doses held due to colitis. -We reviewed results and images of the CT scan dated 12/05/2019. Bilateral pleural effusions have improved. No pathologically enlarged lymphadenopathy. Bilateral perihilar and paramedian fibrosis and consolidation is also stable. -I have reviewed his labs which are grossly within normal limits. I plan to see him back in 3 months with repeat labs and scans. -CT CAP on 03/09/2020 shows stable radiation changes involving the paramediastinal lungs and hilar regions bilaterally. No evidence of recurrence or metastatic disease. 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 Stable small left adrenal gland nodules.  2. Metastatic disease to the brain: -He had 3 small subcentimeter meta stasis in the right insula, right occipital lobe and right cerebellar hemisphere, treated with whole brain RT from 09/02/2018 through 09/13/2018. -MRI of the brain on 09/18/2019 did not show any evidence of intracranial metastasis. -Brain MRI on 01/16/2020 no brain metastasis. Stable to slightly increased postradiation changes in the cerebral white matter.  3. Colitis: -He experienced diarrhea with blood when he was on durvalumab and was treated with prednisone in the first week of August 2020, gradually tapered off around 04/27/2019. -He does not have any GI symptoms at this time.   PLAN:  1. Extensive stage small cell lung  cancer: -He does not have any clinical signs or symptoms of recurrence. -Last imaging in December 2021 did not show any evidence of recurrence or metastatic disease. -He is using walker to ambulate because of balance issues.  He has not been falling since he started using the walker.  He is also doing  physical therapy at home. -Reviewed his labs and records from recent hospitalization. -Recommend follow-up in 2 months with repeat CT CAP and routine labs.  2. Metastatic disease to the brain: -He has an MRI scheduled on 11/04/2020 by Dr. Mickeal Skinner.   Orders placed this encounter:  No orders of the defined types were placed in this encounter.    Derek Jack, MD [facility] [phone]   I, Thana Ates, am acting as a scribe for Dr. Derek Jack.  {Add Barista Statement}

## 2020-12-29 ENCOUNTER — Ambulatory Visit (HOSPITAL_COMMUNITY): Payer: TRICARE For Life (TFL) | Admitting: Hematology

## 2020-12-29 DIAGNOSIS — C3491 Malignant neoplasm of unspecified part of right bronchus or lung: Secondary | ICD-10-CM

## 2020-12-30 ENCOUNTER — Inpatient Hospital Stay (HOSPITAL_BASED_OUTPATIENT_CLINIC_OR_DEPARTMENT_OTHER): Payer: Medicare Other | Admitting: Internal Medicine

## 2020-12-30 ENCOUNTER — Other Ambulatory Visit: Payer: Self-pay

## 2020-12-30 VITALS — BP 141/82 | HR 81 | Temp 98.2°F | Resp 18 | Wt 168.6 lb

## 2020-12-30 DIAGNOSIS — J449 Chronic obstructive pulmonary disease, unspecified: Secondary | ICD-10-CM | POA: Diagnosis not present

## 2020-12-30 DIAGNOSIS — I63312 Cerebral infarction due to thrombosis of left middle cerebral artery: Secondary | ICD-10-CM | POA: Diagnosis not present

## 2020-12-30 DIAGNOSIS — R269 Unspecified abnormalities of gait and mobility: Secondary | ICD-10-CM | POA: Diagnosis not present

## 2020-12-30 DIAGNOSIS — E119 Type 2 diabetes mellitus without complications: Secondary | ICD-10-CM | POA: Diagnosis not present

## 2020-12-30 DIAGNOSIS — C3491 Malignant neoplasm of unspecified part of right bronchus or lung: Secondary | ICD-10-CM | POA: Diagnosis not present

## 2020-12-30 DIAGNOSIS — R413 Other amnesia: Secondary | ICD-10-CM | POA: Diagnosis not present

## 2020-12-30 DIAGNOSIS — Z79899 Other long term (current) drug therapy: Secondary | ICD-10-CM | POA: Diagnosis not present

## 2020-12-30 DIAGNOSIS — Z7901 Long term (current) use of anticoagulants: Secondary | ICD-10-CM | POA: Diagnosis not present

## 2020-12-30 DIAGNOSIS — F17210 Nicotine dependence, cigarettes, uncomplicated: Secondary | ICD-10-CM | POA: Diagnosis not present

## 2020-12-30 DIAGNOSIS — C7931 Secondary malignant neoplasm of brain: Secondary | ICD-10-CM

## 2020-12-30 DIAGNOSIS — C771 Secondary and unspecified malignant neoplasm of intrathoracic lymph nodes: Secondary | ICD-10-CM | POA: Diagnosis present

## 2020-12-30 DIAGNOSIS — Z9221 Personal history of antineoplastic chemotherapy: Secondary | ICD-10-CM | POA: Diagnosis not present

## 2020-12-30 DIAGNOSIS — Z7982 Long term (current) use of aspirin: Secondary | ICD-10-CM | POA: Diagnosis not present

## 2020-12-30 DIAGNOSIS — I10 Essential (primary) hypertension: Secondary | ICD-10-CM | POA: Diagnosis not present

## 2020-12-30 NOTE — Progress Notes (Signed)
[name], [name] [id number] [phone]   Interval Evaluation  Date of Service: 12/30/20 Patient Name: [name] Patient MRN: [id number] Patient DOB: 1945/10/30 Provider: Ventura Sellers, MD  Identifying Statement:  [name] is a 75 y.o. 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[place])  08/22/2018 Initial Diagnosis   Small cell carcinoma of lung, right ([place])   08/23/2018 - 11/01/2018 Chemotherapy   The patient had palonosetron (ALOXI) injection 0.25 mg, 0.25 mg, Intravenous,  Once, 4 of 4 cycles Administration: 0.25 mg (08/23/2018), 0.25 mg (09/16/2018), 0.25 mg (10/07/2018), 0.25 mg (10/28/2018) pegfilgrastim-cbqv (UDENYCA) injection 6 mg, 6 mg, Subcutaneous, Once, 4 of 4 cycles Administration: 6 mg (08/27/2018), 6 mg (09/20/2018), 6 mg (10/11/2018), 6 mg (11/01/2018) CARBOplatin (PARAPLATIN) 560 mg in sodium chloride 0.9 % 250 mL chemo infusion, 560 mg (100 % of original dose 564 mg), Intravenous,  Once, 4 of 4 cycles Dose modification:   (original dose 564 mg, Cycle 1),   (original dose 523.5 mg, Cycle 3),   (original dose 523.5 mg, Cycle 4) Administration: 560 mg (08/23/2018), 560 mg (09/16/2018), 520 mg (10/07/2018), 510 mg (10/28/2018) etoposide (VEPESID) 220 mg in sodium chloride 0.9 % 1,000 mL chemo infusion, 100 mg/m2 = 220 mg, Intravenous,  Once, 4 of 4 cycles Administration: 220 mg (08/23/2018), 200 mg (09/16/2018), 200 mg (09/17/2018), 200 mg (09/18/2018), 200 mg (10/07/2018), 200 mg (10/08/2018), 200 mg (10/09/2018), 200 mg (10/28/2018), 200 mg (10/29/2018), 200 mg (10/30/2018) fosaprepitant (EMEND) 150 mg, dexamethasone (DECADRON) 12 mg in sodium chloride 0.9 % 145 mL IVPB, , Intravenous,  Once, 4 of 4 cycles Administration:  (08/23/2018),  (09/16/2018),  (10/07/2018),  (10/28/2018) durvalumab (IMFINZI)  1,500 mg in sodium chloride 0.9 % 100 mL chemo infusion, 1,500 mg (100 % of original dose 1,500 mg), Intravenous,  Once, 3 of 3 cycles Dose modification: 1,500 mg (original dose 1,500 mg, Cycle 2, Reason: Provider Judgment) Administration: 1,500 mg (09/16/2018), 1,500 mg (10/07/2018), 1,500 mg (10/28/2018)  for chemotherapy treatment.    Small cell carcinoma of lung, right ([name])  08/23/2018 - 11/01/2018 Chemotherapy   The patient had palonosetron (ALOXI) injection 0.25 mg, 0.25 mg, Intravenous,  Once, 4 of 4 cycles Administration: 0.25 mg (08/23/2018), 0.25 mg (09/16/2018), 0.25 mg (10/07/2018), 0.25 mg (10/28/2018) pegfilgrastim-cbqv (UDENYCA) injection 6 mg, 6 mg, Subcutaneous, Once, 4 of 4 cycles Administration: 6 mg (08/27/2018), 6 mg (09/20/2018), 6 mg (10/11/2018), 6 mg (11/01/2018) CARBOplatin (PARAPLATIN) 560 mg in sodium chloride 0.9 % 250 mL chemo infusion, 560 mg (100 % of original dose 564 mg), Intravenous,  Once, 4 of 4 cycles Dose modification:   (original dose 564 mg, Cycle 1),   (original dose 523.5 mg, Cycle 3),   (original dose 523.5 mg, Cycle 4) Administration: 560 mg (08/23/2018), 560 mg (09/16/2018), 520 mg (10/07/2018), 510 mg (10/28/2018) etoposide (VEPESID) 220 mg in sodium chloride 0.9 % 1,000 mL chemo infusion, 100 mg/m2 = 220 mg, Intravenous,  Once, 4 of 4 cycles Administration: 220 mg (08/23/2018), 200 mg (09/16/2018), 200 mg (09/17/2018), 200 mg (09/18/2018), 200 mg (10/07/2018), 200 mg (10/08/2018), 200 mg (10/09/2018), 200 mg (10/28/2018), 200 mg (10/29/2018), 200 mg (10/30/2018) fosaprepitant (EMEND) 150 mg, dexamethasone (DECADRON) 12 mg in sodium chloride 0.9 % 145 mL IVPB, , Intravenous,  Once, 4 of 4 cycles Administration:  (08/23/2018),  (09/16/2018),  (10/07/2018),  (  10/28/2018) durvalumab (IMFINZI) 1,500 mg in sodium chloride 0.9 % 100 mL chemo infusion, 1,500 mg (100 % of original dose 1,500 mg), Intravenous,  Once, 3 of 3 cycles Dose modification: 1,500 mg (original dose 1,500 mg, Cycle 2,  Reason: Provider Judgment) Administration: 1,500 mg (09/16/2018), 1,500 mg (10/07/2018), 1,500 mg (10/28/2018)  for chemotherapy treatment.    08/26/2018 Initial Diagnosis   Small cell carcinoma of lung, right ([name])   11/20/2018 -  Chemotherapy   The patient had durvalumab (IMFINZI) 1,500 mg in sodium chloride 0.9 % 100 mL chemo infusion, 1,500 mg (100 % of original dose 1,500 mg), Intravenous,  Once, 4 of 9 cycles Dose modification: 1,500 mg (original dose 1,500 mg, Cycle 1, Reason: Other (see comments)) Administration: 1,500 mg (11/20/2018), 1,500 mg (12/19/2018), 1,500 mg (01/16/2019), 1,500 mg (02/13/2019)  for chemotherapy treatment.     CNS Oncologic History 09/13/18: Completes WBRT   Interval History:  DAMARRION MIMBS presents for follow up today after MRI brain.  He describes no John or progressive neurologic deficits.  He continues to be limited in physical activity; has been going out for breakfast each morning.  Uses cane or 4-pointed walker currently.  Continues on same medications, no change from prior.  Prior- Patient presents today for follow up after recent hospitalization for acute stroke.  He denies any focal stroke-like symptoms, findings was made incidentally after MRI scan last week.  He does complain of short term memory loss, which has intensified in recent weeks/months.  He is still mostly able to function independently; he is able to take care of his needs and even do a little driving.  Issues with balance and gait persist, are not worse from baseline.  Currently on ASA+plavix for 3 weeks per neuro-hospitalist team.  Medications: Current Outpatient Medications on File Prior to Visit  Medication Sig Dispense Refill  . albuterol (PROVENTIL HFA;VENTOLIN HFA) 108 (90 Base) MCG/ACT inhaler Inhale 2 puffs into the lungs every 6 (six) hours as needed for wheezing or shortness of breath.    . Cholecalciferol 50 MCG (2000 UT) TBDP Take 1 tablet by mouth daily.    . clopidogrel (PLAVIX) 75  MG tablet Take 1 tablet (75 mg total) by mouth daily. 30 tablet 1  . levothyroxine (SYNTHROID) 150 MCG tablet Take 150 mcg by mouth daily.    . Multiple Vitamin (MULTIVITAMIN WITH MINERALS) TABS tablet Take 1 tablet by mouth daily.    . rosuvastatin (CRESTOR) 20 MG tablet Take 1 tablet (20 mg total) by mouth daily. 30 tablet 0  . thiamine 100 MG tablet Take 1 tablet (100 mg total) by mouth daily. 30 tablet 2  . vitamin B-12 1000 MCG tablet Take 1 tablet (1,000 mcg total) by mouth daily. 30 tablet 1   No current facility-administered medications on file prior to visit.    Allergies: No Known Allergies Past Medical History:  Past Medical History:  Diagnosis Date  . Actinic keratosis   . Arthritis   . Colitis MAY 2012    CT ABD/PELVIS HEP FLEXURE  . COPD (chronic obstructive pulmonary disease) ([place])   . Diabetes mellitus without complication ([place])   . History of kidney stones   . Hyperlipemia   . Hypertension   . NSAID long-term use NAPROXEN FOR OA  . SCL Ca dx'd 08/2018   Past Surgical History:  Past Surgical History:  Procedure Laterality Date  . BACK SURGERY     spinal stenosis  . BASAL CELL CARCINOMA EXCISION  FACE, arms  feet, leg  . CHOLECYSTECTOMY  JUNE 2011 MJ   STONES, PANCREATITIS  . KNEE ARTHROSCOPY WITH MEDIAL MENISECTOMY Right 03/24/2015   Procedure: KNEE ARTHROSCOPY WITH MEDIAL MENISECTOMY;  Surgeon: Carole Civil, MD;  Location: [facility];  Service: Orthopedics;  Laterality: Right;  . KNEE SURGERY Left LEFT   arthroscopy  . LITHOTRIPSY  80s  . PORTACATH PLACEMENT Left 09/11/2018   Procedure: INSERTION PORT-A-CATH;  Surgeon: Virl Cagey, MD;  Location: [facility];  Service: General;  Laterality: Left;  . SPINE SURGERY     Social History:  Social History   Socioeconomic History  . Marital status: Divorced    Spouse name: Not on file  . Number of children: 0  . Years of education: Not on file  . Highest education level: Not on file  Occupational History   . Occupation: [place] [employer]   . Occupation: Manufacturing engineer   Tobacco Use  . Smoking status: Current Some Day Smoker    Packs/day: 1.00    Years: 50.00    Pack years: 50.00    Types: Cigarettes    Last attempt to quit: 08/07/2018    Years since quitting: 2.4  . Smokeless tobacco: Never Used  Substance and Sexual Activity  . Alcohol use: Yes    Comment: once a month  . Drug use: No  . Sexual activity: Never    Birth control/protection: None  Other Topics Concern  . 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   Social Determinants of Health   Financial Resource Strain: Not on file  Food Insecurity: Not on file  Transportation Needs: Not on file  Physical Activity: Not on file  Stress: Not on file  Social Connections: Not on file  Intimate Partner Violence: Not on file   Family History:  Family History  Problem Relation Age of Onset  . Cancer Mother        lung  . Cancer Father        lung and liver  . Colon polyps Neg Hx   . Colon cancer Neg Hx     Review of Systems: Constitutional: Doesn't report fevers, chills or abnormal weight loss Eyes: Doesn't report blurriness of vision Ears, nose, mouth, throat, and face: Doesn't report sore throat Respiratory: Doesn't report cough, dyspnea or wheezes Cardiovascular: Doesn't report palpitation, chest discomfort  Gastrointestinal:  Doesn't report nausea, constipation, diarrhea GU: Doesn't report incontinence Skin: Doesn't report skin rashes Neurological: Per HPI Musculoskeletal: Doesn't report joint pain Behavioral/Psych: Doesn't report anxiety  Physical Exam: Vitals:   12/30/20 0958  BP: (!) 141/82  Pulse: 81  Resp: 18  Temp: 98.2 F (36.8 C)  SpO2: 97%   KPS: 70. General: Alert, cooperative, pleasant, in no acute distress Head: Normal EENT: No conjunctival injection or scleral icterus.  Lungs: Resp effort  normal Cardiac: Regular rate Abdomen: Non-distended abdomen Skin: No rashes cyanosis or petechiae. Extremities: No clubbing or edema  Neurologic Exam: Mental Status: Awake, alert, attentive to examiner. Oriented to self and environment. Language is fluent with intact comprehension.  Cranial Nerves: Visual acuity is grossly normal. Visual fields are full. Extra-ocular movements intact. No ptosis. Face is symmetric Motor: Tone and bulk are normal. Power is full in both arms and legs. Reflexes are symmetric, no pathologic reflexes present.  Sensory: Intact to light touch Gait: Normal.   Labs: I have reviewed the  data as listed    Component Value Date/Time   NA 137 12/24/2020 0935   K 4.0 12/24/2020 0935   CL 102 12/24/2020 0935   CO2 26 12/24/2020 0935   GLUCOSE 98 12/24/2020 0935   BUN 19 12/24/2020 0935   CREATININE 0.78 12/24/2020 0935   CALCIUM 9.2 12/24/2020 0935   PROT 7.2 12/24/2020 0935   ALBUMIN 3.7 12/24/2020 0935   AST 18 12/24/2020 0935   ALT 12 12/24/2020 0935   ALKPHOS 75 12/24/2020 0935   BILITOT 0.8 12/24/2020 0935   GFRNONAA >60 12/24/2020 0935   GFRAA >60 03/09/2020 0925   Lab Results  Component Value Date   WBC 11.0 (H) 12/24/2020   NEUTROABS 8.2 (H) 12/24/2020   HGB 13.3 12/24/2020   HCT 41.7 12/24/2020   MCV 97.9 12/24/2020   PLT 343 12/24/2020    Imaging:  MR BRAIN W WO CONTRAST  Result Date: 12/26/2020 CLINICAL DATA:  Small cell lung cancer. Brain metastases. Assess treatment results. EXAM: MRI HEAD WITHOUT AND WITH CONTRAST TECHNIQUE: Multiplanar, multiecho pulse sequences of the brain and surrounding structures were obtained without and with intravenous contrast. CONTRAST:  32mL MULTIHANCE GADOBENATE DIMEGLUMINE 529 MG/ML IV SOLN COMPARISON:  11/12/2020 and 06/16/2020 FINDINGS: Brain: Expected evolution of the left lentiform nucleus infarcts noted. Confluent T2 hyperintensities throughout the white matter again noted bilaterally. Stable atrophy.  The subtle area of enhancement noted in the right temporal lobe on the prior study is not present on today's study. However, focal area of cortical or more likely intravascular enhancement is present in the right temporal lobe, best seen on image 75-77 of series 12 and image 24 of series 14. No other John foci of enhancement are present to suggest metastatic disease. The ventricles are proportionate to the degree of atrophy. No significant extraaxial fluid collection is present. T2 changes in the left mid brain and pons are consistent with well layering degeneration associated with the recent basal ganglia infarct White matter changes are noted in the cerebellum bilaterally. Vascular: Flow is present in the major intracranial arteries. Skull and upper cervical spine: Craniocervical junction is normal. Flow is present in the vertebral arteries bilaterally. Visualized intracranial contents are normal. Sinuses/Orbits: The left maxillary sinus is opacified. Fluid level is present. Minimal aeration remains. Circumferential mucosal thickening is present in the inferior right maxillary sinus with mucosal thickening scattered throughout the ethmoid air cells. Sphenoid sinus is clear. Fluid is present in the left greater than right mastoid air cells. No obstructing nasopharyngeal lesion is present. The globes and orbits are within normal limits. IMPRESSION: 1. Expected evolution of left lentiform nucleus infarct. No acute infarcts. 2. Previously seen area of enhancement in the right temporal lobe is not present on today's study. 3. John area of cortical or more likely intravascular enhancement in the right temporal lobe present. This could be related to subacute or early chronic ischemia. Metastasis is considered less likely. Recommend continued surveillance with attention to this area. 4. No other John foci of enhancement to suggest metastatic disease. 5. Stable atrophy and diffuse white matter disease. 6. Acute left maxillary  sinus disease. 7. Left greater than right mastoid effusions. No obstructing nasopharyngeal lesion is present. Electronically Signed   By: San Morelle M.D.   On: 12/26/2020 11:42   CT CHEST ABDOMEN PELVIS W CONTRAST  Result Date: 12/26/2020 CLINICAL DATA:  Follow-up metastatic small cell carcinoma of right lung. Status post chemotherapy and immunotherapy. EXAM: CT CHEST, ABDOMEN, AND PELVIS WITH CONTRAST TECHNIQUE: Multidetector  CT imaging of the chest, abdomen and pelvis was performed following the standard protocol during bolus administration of intravenous contrast. CONTRAST:  131mL OMNIPAQUE IOHEXOL 300 MG/ML  SOLN COMPARISON:  07/14/2020 FINDINGS: CT CHEST FINDINGS Cardiovascular: No acute findings. Stable 4.3 cm ascending thoracic aortic aneurysm. Aortic and coronary atherosclerotic calcification noted. Tiny epicardial cyst in the right cardiophrenic angle has significantly decreased in size since previous study. No evidence of pericardial effusion. Mediastinum/Lymph Nodes: No masses or pathologically enlarged lymph nodes identified. Lungs/Pleura: Stable post radiation changes in the paramediastinal lung zones bilaterally. No suspicious pulmonary nodules or masses identified. No evidence of infiltrate or pleural effusion. Musculoskeletal:  No suspicious bone lesions identified. CT ABDOMEN AND PELVIS FINDINGS Hepatobiliary: No masses identified. Prior cholecystectomy. No evidence of biliary obstruction. Pancreas:  No mass or inflammatory changes. Spleen:  Within normal limits in size and appearance. Adrenals/Urinary tract: Stable 1.3 cm low-attenuation left adrenal nodule, consistent with benign adenoma. Small left renal cysts remains stable. No renal masses are identified. A few punctate renal calculi are seen bilaterally, however there is no evidence of ureteral calculi or hydronephrosis. Stable diffuse bladder wall thickening is seen, likely due to chronic bladder outlet obstruction given  enlarged prostate. 7 mm calculus again seen along the left posterior bladder wall. Stomach/Bowel: No evidence of obstruction, inflammatory process, or abnormal fluid collections. Vascular/Lymphatic: No pathologically enlarged lymph nodes identified. No acute vascular findings. Aortic atherosclerotic calcification noted. Reproductive: Stable moderately enlarged prostate gland with mass effect on bladder base. Other:  None. Musculoskeletal:  No suspicious bone lesions identified. IMPRESSION: Stable exam. No evidence of recurrent or metastatic carcinoma within the chest, abdomen, or pelvis. Stable enlarged prostate and findings of chronic bladder outlet obstruction. Bilateral nephrolithiasis and small bladder calculus. No evidence of ureteral calculi or hydronephrosis. Stable 4.3 cm ascending thoracic aortic aneurysm. Recommend continued annual imaging followup by CT. This recommendation follows 2010 ACCF/AHA/AATS/ACR/ASA/SCA/SCAI/SIR/STS/SVM Guidelines for the Diagnosis and Management of Patients with Thoracic Aortic Disease. Circulation. 2010; 121: Y563-S937. Aortic aneurysm NOS (ICD10-I71.9) Stable small benign adrenal adenoma. Aortic Atherosclerosis (ICD10-I70.0). Electronically Signed   By: Marlaine Hind M.D.   On: 12/26/2020 17:02    [facility] Clinician Interpretation: I have personally reviewed the radiological images as listed.  My interpretation, in the context of the patient's clinical presentation, is stable disease   Assessment/Plan Brain metastasis ([place]) [C79.31]  Cinque Begley Landess is clinically stable today.  MRI does not re-demonstrate R temporal focus of contrast enhancement seen prior.  This may have been imaging artifact or transient BBB disruption from delayed effect radiation.  He will continue crestor 20mg  daily.  Blood pressure is controlled at this time.  He will also continue with PT for gait issues.  We ask that KANIEL KIANG return to clinic in 6 months following next brain MRI, or sooner  as needed.  We appreciate the opportunity to participate in the care of Gauge W Woollard.   All questions were answered. The patient knows to call the clinic with any problems, questions or concerns. No barriers to learning were detected.  The total time spent in the encounter was 30 minutes and more than 50% was on counseling and review of test results   Ventura Sellers, MD Medical Director of Neuro-Oncology [facility] at [place] 12/30/20 10:15 AM

## 2021-01-20 ENCOUNTER — Other Ambulatory Visit: Payer: Self-pay

## 2021-01-20 ENCOUNTER — Other Ambulatory Visit: Payer: Self-pay | Admitting: Nurse Practitioner

## 2021-01-20 ENCOUNTER — Encounter: Payer: Self-pay | Admitting: Nurse Practitioner

## 2021-01-20 DIAGNOSIS — R5381 Other malaise: Secondary | ICD-10-CM

## 2021-01-20 DIAGNOSIS — Z515 Encounter for palliative care: Secondary | ICD-10-CM

## 2021-01-20 NOTE — Progress Notes (Signed)
Therapist, nutritional Palliative Care Consult Note Telephone: [phone]  Fax: [phone]    Date of encounter: 01/20/21 PATIENT NAME: [name] [address] [place] [address]   [phone] (home)  DOB: [birth year] MRN: [id number] PRIMARY CARE PROVIDER:    Oval Linsey, MD,  [address] [place] [address] [phone]  RESPONSIBLE PARTY:    Contact Information     Name Relation Home Work Mobile   [place] Sister   [phone]   Deguire,Richard Brother   [phone]      I met face to face with patient and brother in home. Palliative Care was asked to follow this patient by consultation request of  Oval Linsey, MD to address advance care planning and complex medical decision making. This is a follow up visit. ASSESSMENT AND PLAN / RECOMMENDATIONS:   Symptom Management/Plan: 1. ACP: Code status DNR in vynca   2. Debility secondary to CVA and SCL, continue to encourage activity; continue physical therapy for strengthening, balance, gait   3. Palliative care encounter; Palliative care encounter; Palliative medicine team will continue to support patient, patient's family, and medical team. Visit consisted of counseling and education dealing with the complex and emotionally intense issues of symptom management and palliative care in the setting of serious and potentially life-threatening illness  Follow up Palliative Care Visit: Palliative care will continue to follow for complex medical decision making, advance care planning, and clarification of goals. Return 8 weeks or prn.  I spent 60 minutes providing this consultation. More than 50% of the time in this consultation was spent in counseling and care coordination.  PPS: 60%  HOSPICE ELIGIBILITY/DIAGNOSIS: TBD  Chief Complaint: Follow up Palliative consult for complex medical decision making  HISTORY OF PRESENT ILLNESS:  [name] is a 75 y.o. 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 Hospitalize 11/10/2020 to 11/11/2020 for routine MRI showed CVA with no focal deficits admitted for work up. CTA head and neck without any major stenosis. Followed by Dr Barbaraann Cao 12/30/2020 Oncology with current weight 168 lbs; clinically appears stable, MRI does not re-demonstrate R temporal focus of contrast enhancement seen prior. This may have been imaging artifact or transient BBB disruption from delayed effect radiation with plan to return to clinical in 6 months for next brain MRI. I called [name] to confirm in person palliative care follow up visit in [place] screening which was negative. I visit [name] and his home with his brother present. John and I was sitting on the couch. We talked about purpose of palliative care visit. We talked about how he has been feeling today. [name] endorses he is doing OK. He has gone to breakfast this morning at his usual gathering. We talked about symptoms of pain which he does experience throughout his body which Tylenol does help to really V8. We talked about his appetite. [name] endorses his appetite has improved but he continues not to gain weight.  John. Short endorses he used to weigh 210 pounds and dropped down to 155 lbs with last weight being 168 lbs. We talked about no noted weight loss just can not gain weight. We talked about nutrition. Offered Meals on Wheels or a organization that delivers boxes of food but  [name] declined. We talked about Oncology visit with the following scans to be done in six months. We talked about medical goals. No recent falls. We talked about strengthening, balance. John Charland endorses he feels like he has gotten a little bit  stronger as he was able to walk the perimeter of the property this morning. We talked about fall risk and caution being outside in the heat as well. John Weissberg does walk early in the day. We talked about roll a pallet of care and plan of care period we talked about any current needs which [name] replies no. We talked about follow up palliative care visit and scheduled. Asked [name] agreeable to allowing me to contact his sister for update on palliative care visit as she does go with him to all of his appointments. [name] in agreement.   I called [name], John. Dacey sister, updated on clinical presentation, visit with John. Rynders. We talked about symptoms, appetite, weight, medical goals of care. We talked about functional and cognitive abilities. We talked about driving. We talked about physical therapy with continue with progression. We talked about John. Patlan out look on life, DNR, reality of his cancer dx, most recent Oncology visit, quality of life. We talked about Hospice at this time [name] would like to wait to further discuss as John. Short may meet criteria due to weight loss. We talked about role PC in poc. We talked about next PC visit, scheduled. Therapeutic listening, emotions support provided.   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[place])  08/22/2018 Initial Diagnosis    Small cell carcinoma of lung, right ([place])    08/23/2018 - 11/01/2018 Chemotherapy    The patient had palonosetron (ALOXI) injection 0.25 mg, 0.25 mg, Intravenous,  Once, 4 of 4 cycles Administration: 0.25 mg (08/23/2018), 0.25 mg (09/16/2018), 0.25 mg (10/07/2018), 0.25 mg (10/28/2018) pegfilgrastim-cbqv (UDENYCA) injection 6 mg, 6 mg, Subcutaneous, Once, 4 of 4 cycles Administration: 6 mg (08/27/2018), 6 mg (09/20/2018), 6 mg (10/11/2018), 6 mg (11/01/2018) CARBOplatin (PARAPLATIN) 560 mg in sodium chloride 0.9 % 250 mL chemo infusion, 560 mg (100 % of original dose 564 mg), Intravenous,   Once, 4 of 4 cycles Dose modification:   (original dose 564 mg, Cycle 1),   (original dose 523.5 mg, Cycle 3),   (original dose 523.5 mg, Cycle 4) Administration: 560 mg (08/23/2018), 560 mg (09/16/2018), 520 mg (10/07/2018), 510 mg (10/28/2018) etoposide (VEPESID) 220 mg in sodium chloride 0.9 % 1,000 mL chemo infusion, 100 mg/m2 = 220 mg, Intravenous,  Once, 4 of 4 cycles Administration: 220 mg (08/23/2018), 200 mg (09/16/2018), 200 mg (09/17/2018), 200 mg (09/18/2018), 200 mg (10/07/2018), 200 mg (10/08/2018), 200 mg (10/09/2018), 200 mg (10/28/2018), 200 mg (10/29/2018), 200 mg (10/30/2018) fosaprepitant (EMEND) 150 mg, dexamethasone (DECADRON) 12 mg in sodium chloride 0.9 % 145 mL IVPB, , Intravenous,  Once, 4 of 4 cycles Administration:  (08/23/2018),  (09/16/2018),  (10/07/2018),  (10/28/2018) durvalumab (IMFINZI) 1,500 mg in sodium chloride 0.9 % 100 mL chemo infusion, 1,500 mg (100 % of original dose 1,500 mg), Intravenous,  Once, 3 of 3 cycles Dose modification: 1,500 mg (original dose 1,500 mg, Cycle 2, Reason: Provider Judgment) Administration: 1,500 mg (09/16/2018), 1,500 mg (10/07/2018), 1,500 mg (10/28/2018)  for chemotherapy treatment.    Small cell carcinoma of lung, right ([place])  08/23/2018 - 11/01/2018 Chemotherapy    The patient had palonosetron (ALOXI) injection 0.25 mg, 0.25 mg, Intravenous,  Once, 4 of  4 cycles Administration: 0.25 mg (08/23/2018), 0.25 mg (09/16/2018), 0.25 mg (10/07/2018), 0.25 mg (10/28/2018) pegfilgrastim-cbqv (UDENYCA) injection 6 mg, 6 mg, Subcutaneous, Once, 4 of 4 cycles Administration: 6 mg (08/27/2018), 6 mg (09/20/2018), 6 mg (10/11/2018), 6 mg (11/01/2018) CARBOplatin (PARAPLATIN) 560 mg in sodium chloride 0.9 % 250 mL chemo infusion, 560 mg (100 % of original dose 564 mg), Intravenous,  Once, 4 of 4 cycles Dose modification:   (original dose 564 mg, Cycle 1),   (original dose 523.5 mg, Cycle 3),   (original dose 523.5 mg, Cycle 4) Administration: 560 mg (08/23/2018), 560 mg  (09/16/2018), 520 mg (10/07/2018), 510 mg (10/28/2018) etoposide (VEPESID) 220 mg in sodium chloride 0.9 % 1,000 mL chemo infusion, 100 mg/m2 = 220 mg, Intravenous,  Once, 4 of 4 cycles Administration: 220 mg (08/23/2018), 200 mg (09/16/2018), 200 mg (09/17/2018), 200 mg (09/18/2018), 200 mg (10/07/2018), 200 mg (10/08/2018), 200 mg (10/09/2018), 200 mg (10/28/2018), 200 mg (10/29/2018), 200 mg (10/30/2018) fosaprepitant (EMEND) 150 mg, dexamethasone (DECADRON) 12 mg in sodium chloride 0.9 % 145 mL IVPB, , Intravenous,  Once, 4 of 4 cycles Administration:  (08/23/2018),  (09/16/2018),  (10/07/2018),  (10/28/2018) durvalumab (IMFINZI) 1,500 mg in sodium chloride 0.9 % 100 mL chemo infusion, 1,500 mg (100 % of original dose 1,500 mg), Intravenous,  Once, 3 of 3 cycles Dose modification: 1,500 mg (original dose 1,500 mg, Cycle 2, Reason: Provider Judgment) Administration: 1,500 mg (09/16/2018), 1,500 mg (10/07/2018), 1,500 mg (10/28/2018)  for chemotherapy treatment.    08/26/2018 Initial Diagnosis    Small cell carcinoma of lung, right ([place])    11/20/2018 - Chemotherapy    The patient had durvalumab (IMFINZI) 1,500 mg in sodium chloride 0.9 % 100 mL chemo infusion, 1,500 mg (100 % of original dose 1,500 mg), Intravenous,  Once, 4 of 9 cycles Dose modification: 1,500 mg (original dose 1,500 mg, Cycle 1, Reason: Other (see comments)) Administration: 1,500 mg (11/20/2018), 1,500 mg (12/19/2018), 1,500 mg (01/16/2019), 1,500 mg (02/13/2019)  for chemotherapy treatment.      CNS Oncologic History 09/13/18: Completes WBRT   History obtained from review of EMR, discussion with  brother and John. Short.  I reviewed available labs, medications, imaging, studies and related documents from the EMR.  Records reviewed and summarized above.   ROS Full 14 system review of systems performed and negative with exception of: as per HPI.   Physical Exam: Constitutional: NAD General: frail appearing, thin pleasant male EYES: lids  intact ENMT: oral mucous membranes moist CV: S1S2, RRR, no LE edema Pulmonary: LCTA, no increased work of breathing, no cough, room air Abdomen: normo-active BS + 4 quadrants, soft and non tender MSK: ambulatory; muscle wasting Skin: warm and dry, no rashes or wounds on visible skin Neuro:  + generalized weakness,  no cognitive impairment Psych: non-anxious affect, A and O x 3  Questions and concerns were addressed. The patient/family was encouraged to call with questions and/or concerns. My contact information was provided. Provided general support and encouragement, no other unmet needs identified   Thank you for the opportunity to participate in the care of [name].  The palliative care team will continue to follow. Please call our office at [phone] if we can be of additional assistance.   This chart was dictated using voice recognition software.  Despite best efforts to proofread,  errors can occur which can change the documentation meaning.   Kweli Grassel Z Jasmarie Coppock, NP , MSN,  ACHPN  COVID-19 PATIENT SCREENING TOOL Asked and negative response  unless otherwise noted:   Have you had symptoms of covid, tested positive or been in contact with someone with symptoms/positive test in the past 5-10 days? NO

## 2021-01-21 ENCOUNTER — Encounter: Payer: Self-pay | Admitting: Internal Medicine

## 2021-02-08 ENCOUNTER — Inpatient Hospital Stay (HOSPITAL_COMMUNITY)
Admission: EM | Admit: 2021-02-08 | Discharge: 2021-02-14 | DRG: 482 | Disposition: A | Discharge status: Skilled Nursing Facility | Payer: Medicare Other | Attending: Internal Medicine | Admitting: Internal Medicine

## 2021-02-08 ENCOUNTER — Emergency Department (HOSPITAL_COMMUNITY): Payer: Medicare Other

## 2021-02-08 ENCOUNTER — Other Ambulatory Visit: Payer: Self-pay

## 2021-02-08 ENCOUNTER — Encounter (HOSPITAL_COMMUNITY): Payer: Self-pay | Admitting: Emergency Medicine

## 2021-02-08 DIAGNOSIS — K219 Gastro-esophageal reflux disease without esophagitis: Secondary | ICD-10-CM

## 2021-02-08 DIAGNOSIS — D72829 Elevated white blood cell count, unspecified: Secondary | ICD-10-CM | POA: Diagnosis present

## 2021-02-08 DIAGNOSIS — R350 Frequency of micturition: Secondary | ICD-10-CM | POA: Diagnosis present

## 2021-02-08 DIAGNOSIS — Z7902 Long term (current) use of antithrombotics/antiplatelets: Secondary | ICD-10-CM | POA: Diagnosis not present

## 2021-02-08 DIAGNOSIS — Z87442 Personal history of urinary calculi: Secondary | ICD-10-CM | POA: Diagnosis not present

## 2021-02-08 DIAGNOSIS — Z85118 Personal history of other malignant neoplasm of bronchus and lung: Secondary | ICD-10-CM

## 2021-02-08 DIAGNOSIS — Z79899 Other long term (current) drug therapy: Secondary | ICD-10-CM

## 2021-02-08 DIAGNOSIS — S72009A Fracture of unspecified part of neck of unspecified femur, initial encounter for closed fracture: Secondary | ICD-10-CM | POA: Diagnosis present

## 2021-02-08 DIAGNOSIS — N401 Enlarged prostate with lower urinary tract symptoms: Secondary | ICD-10-CM

## 2021-02-08 DIAGNOSIS — Z79890 Hormone replacement therapy (postmenopausal): Secondary | ICD-10-CM | POA: Diagnosis not present

## 2021-02-08 DIAGNOSIS — W19XXXA Unspecified fall, initial encounter: Secondary | ICD-10-CM

## 2021-02-08 DIAGNOSIS — I10 Essential (primary) hypertension: Secondary | ICD-10-CM

## 2021-02-08 DIAGNOSIS — E785 Hyperlipidemia, unspecified: Secondary | ICD-10-CM

## 2021-02-08 DIAGNOSIS — Z8673 Personal history of transient ischemic attack (TIA), and cerebral infarction without residual deficits: Secondary | ICD-10-CM

## 2021-02-08 DIAGNOSIS — Z01810 Encounter for preprocedural cardiovascular examination: Secondary | ICD-10-CM

## 2021-02-08 DIAGNOSIS — Z66 Do not resuscitate: Secondary | ICD-10-CM | POA: Diagnosis present

## 2021-02-08 DIAGNOSIS — S72001A Fracture of unspecified part of neck of right femur, initial encounter for closed fracture: Secondary | ICD-10-CM | POA: Diagnosis not present

## 2021-02-08 DIAGNOSIS — M1611 Unilateral primary osteoarthritis, right hip: Secondary | ICD-10-CM | POA: Diagnosis present

## 2021-02-08 DIAGNOSIS — S72031A Displaced midcervical fracture of right femur, initial encounter for closed fracture: Principal | ICD-10-CM | POA: Diagnosis present

## 2021-02-08 DIAGNOSIS — E039 Hypothyroidism, unspecified: Secondary | ICD-10-CM | POA: Diagnosis present

## 2021-02-08 DIAGNOSIS — I639 Cerebral infarction, unspecified: Secondary | ICD-10-CM | POA: Diagnosis not present

## 2021-02-08 DIAGNOSIS — R5381 Other malaise: Secondary | ICD-10-CM

## 2021-02-08 DIAGNOSIS — J449 Chronic obstructive pulmonary disease, unspecified: Secondary | ICD-10-CM | POA: Diagnosis present

## 2021-02-08 DIAGNOSIS — Z801 Family history of malignant neoplasm of trachea, bronchus and lung: Secondary | ICD-10-CM

## 2021-02-08 DIAGNOSIS — F17210 Nicotine dependence, cigarettes, uncomplicated: Secondary | ICD-10-CM | POA: Diagnosis present

## 2021-02-08 DIAGNOSIS — E119 Type 2 diabetes mellitus without complications: Secondary | ICD-10-CM | POA: Diagnosis present

## 2021-02-08 DIAGNOSIS — Z85828 Personal history of other malignant neoplasm of skin: Secondary | ICD-10-CM | POA: Diagnosis not present

## 2021-02-08 DIAGNOSIS — Z20822 Contact with and (suspected) exposure to covid-19: Secondary | ICD-10-CM | POA: Diagnosis present

## 2021-02-08 DIAGNOSIS — Z85841 Personal history of malignant neoplasm of brain: Secondary | ICD-10-CM | POA: Diagnosis not present

## 2021-02-08 DIAGNOSIS — W010XXA Fall on same level from slipping, tripping and stumbling without subsequent striking against object, initial encounter: Secondary | ICD-10-CM | POA: Diagnosis present

## 2021-02-08 DIAGNOSIS — W19XXXD Unspecified fall, subsequent encounter: Secondary | ICD-10-CM | POA: Diagnosis not present

## 2021-02-08 DIAGNOSIS — Y92019 Unspecified place in single-family (private) house as the place of occurrence of the external cause: Secondary | ICD-10-CM

## 2021-02-08 LAB — CBC WITH DIFFERENTIAL/PLATELET
Abs Immature Granulocytes: 0.1 10*3/uL — ABNORMAL HIGH (ref 0.00–0.07)
Basophils Absolute: 0 10*3/uL (ref 0.0–0.1)
Basophils Relative: 0 %
Eosinophils Absolute: 0 10*3/uL (ref 0.0–0.5)
Eosinophils Relative: 0 %
HCT: 44.4 % (ref 39.0–52.0)
Hemoglobin: 14.4 g/dL (ref 13.0–17.0)
Immature Granulocytes: 1 %
Lymphocytes Relative: 8 %
Lymphs Abs: 1.2 10*3/uL (ref 0.7–4.0)
MCH: 31.1 pg (ref 26.0–34.0)
MCHC: 32.4 g/dL (ref 30.0–36.0)
MCV: 95.9 fL (ref 80.0–100.0)
Monocytes Absolute: 0.6 10*3/uL (ref 0.1–1.0)
Monocytes Relative: 4 %
Neutro Abs: 12.3 10*3/uL — ABNORMAL HIGH (ref 1.7–7.7)
Neutrophils Relative %: 87 %
Platelets: 233 10*3/uL (ref 150–400)
RBC: 4.63 MIL/uL (ref 4.22–5.81)
RDW: 15.1 % (ref 11.5–15.5)
WBC: 14.2 10*3/uL — ABNORMAL HIGH (ref 4.0–10.5)
nRBC: 0 % (ref 0.0–0.2)

## 2021-02-08 LAB — PROTIME-INR
INR: 1.1 (ref 0.8–1.2)
Prothrombin Time: 14.1 seconds (ref 11.4–15.2)

## 2021-02-08 LAB — BASIC METABOLIC PANEL WITH GFR
Anion gap: 9 (ref 5–15)
BUN: 21 mg/dL (ref 8–23)
CO2: 28 mmol/L (ref 22–32)
Calcium: 9.6 mg/dL (ref 8.9–10.3)
Chloride: 104 mmol/L (ref 98–111)
Creatinine, Ser: 0.6 mg/dL — ABNORMAL LOW (ref 0.61–1.24)
GFR, Estimated: >60 mL/min
Glucose, Bld: 94 mg/dL (ref 70–99)
Potassium: 4.1 mmol/L (ref 3.5–5.1)
Sodium: 141 mmol/L (ref 135–145)

## 2021-02-08 LAB — TYPE AND SCREEN
ABO/RH(D): O NEG
Antibody Screen: NEGATIVE

## 2021-02-08 LAB — RESP PANEL BY RT-PCR (FLU A&B, COVID) ARPGX2
Influenza A by PCR: NEGATIVE
Influenza B by PCR: NEGATIVE
SARS Coronavirus 2 by RT PCR: NEGATIVE

## 2021-02-08 LAB — GLUCOSE, CAPILLARY
Glucose-Capillary: 103 mg/dL — ABNORMAL HIGH (ref 70–99)
Glucose-Capillary: 97 mg/dL (ref 70–99)

## 2021-02-08 MED ORDER — THIAMINE HCL 100 MG PO TABS
100.0000 mg | ORAL_TABLET | Freq: Every day | ORAL | Status: DC
  Administered 2021-02-09 – 2021-02-14 (×6): 100 mg via ORAL
  Filled 2021-02-08 (×6): qty 1

## 2021-02-08 MED ORDER — SODIUM CHLORIDE 0.9% FLUSH
3.0000 mL | Freq: Two times a day (BID) | INTRAVENOUS | Status: DC
  Administered 2021-02-08 – 2021-02-14 (×13): 3 mL via INTRAVENOUS

## 2021-02-08 MED ORDER — ONDANSETRON HCL 4 MG/2ML IJ SOLN: 4.0000 mg | Freq: Four times a day (QID) | INTRAMUSCULAR | Status: DC | PRN

## 2021-02-08 MED ORDER — KETOROLAC TROMETHAMINE 15 MG/ML IJ SOLN
15.0000 mg | Freq: Four times a day (QID) | INTRAMUSCULAR | Status: AC | PRN
  Administered 2021-02-08 – 2021-02-09 (×2): 15 mg via INTRAVENOUS
  Filled 2021-02-08 (×2): qty 1

## 2021-02-08 MED ORDER — ALBUTEROL SULFATE (2.5 MG/3ML) 0.083% IN NEBU: 3.0000 mL | INHALATION_SOLUTION | Freq: Four times a day (QID) | RESPIRATORY_TRACT | Status: DC | PRN

## 2021-02-08 MED ORDER — SODIUM CHLORIDE 0.9 % IV SOLN: 250.0000 mL | INTRAVENOUS | Status: DC | PRN

## 2021-02-08 MED ORDER — FENTANYL CITRATE (PF) 100 MCG/2ML IJ SOLN
50.0000 ug | Freq: Once | INTRAMUSCULAR | Status: DC
  Filled 2021-02-08: qty 2

## 2021-02-08 MED ORDER — SODIUM CHLORIDE 0.9% FLUSH: 3.0000 mL | INTRAVENOUS | Status: DC | PRN

## 2021-02-08 MED ORDER — VITAMIN D 25 MCG (1000 UNIT) PO TABS
50.0000 ug | ORAL_TABLET | Freq: Every day | ORAL | Status: DC
  Administered 2021-02-09 – 2021-02-14 (×6): 50 ug via ORAL
  Filled 2021-02-08 (×3): qty 1
  Filled 2021-02-08: qty 2
  Filled 2021-02-08 (×4): qty 1
  Filled 2021-02-08: qty 2

## 2021-02-08 MED ORDER — ADULT MULTIVITAMIN W/MINERALS CH
1.0000 | ORAL_TABLET | Freq: Every day | ORAL | Status: DC
  Administered 2021-02-09 – 2021-02-14 (×6): 1 via ORAL
  Filled 2021-02-08 (×6): qty 1

## 2021-02-08 MED ORDER — ACETAMINOPHEN 650 MG RE SUPP: 650.0000 mg | Freq: Four times a day (QID) | RECTAL | Status: DC | PRN

## 2021-02-08 MED ORDER — ACETAMINOPHEN 325 MG PO TABS
650.0000 mg | ORAL_TABLET | Freq: Four times a day (QID) | ORAL | Status: DC | PRN
  Administered 2021-02-08 – 2021-02-12 (×3): 650 mg via ORAL
  Filled 2021-02-08 (×3): qty 2

## 2021-02-08 MED ORDER — INSULIN ASPART 100 UNIT/ML IJ SOLN
0.0000 [IU] | Freq: Three times a day (TID) | INTRAMUSCULAR | Status: DC
  Administered 2021-02-09 – 2021-02-14 (×7): 1 [IU] via SUBCUTANEOUS

## 2021-02-08 MED ORDER — ROSUVASTATIN CALCIUM 20 MG PO TABS
20.0000 mg | ORAL_TABLET | Freq: Every day | ORAL | Status: DC
  Administered 2021-02-09 – 2021-02-14 (×6): 20 mg via ORAL
  Filled 2021-02-08 (×6): qty 1

## 2021-02-08 MED ORDER — LEVOTHYROXINE SODIUM 75 MCG PO TABS
150.0000 ug | ORAL_TABLET | Freq: Every day | ORAL | Status: DC
  Administered 2021-02-09 – 2021-02-14 (×6): 150 ug via ORAL
  Filled 2021-02-08 (×8): qty 2

## 2021-02-08 MED ORDER — ONDANSETRON HCL 4 MG PO TABS: 4.0000 mg | ORAL_TABLET | Freq: Four times a day (QID) | ORAL | Status: DC | PRN

## 2021-02-08 MED ORDER — VITAMIN B-12 1000 MCG PO TABS
1000.0000 ug | ORAL_TABLET | Freq: Every day | ORAL | Status: DC
  Administered 2021-02-09 – 2021-02-14 (×6): 1000 ug via ORAL
  Filled 2021-02-08 (×6): qty 1

## 2021-02-08 MED ORDER — OXYCODONE HCL 5 MG PO TABS
5.0000 mg | ORAL_TABLET | ORAL | Status: DC | PRN
  Administered 2021-02-08 – 2021-02-13 (×10): 5 mg via ORAL
  Filled 2021-02-08 (×10): qty 1

## 2021-02-08 NOTE — ED Provider Notes (Signed)
[facility] EMERGENCY DEPARTMENT Provider Note   CSN: [id number] Arrival date & time: 02/08/21  1055     History Chief Complaint  Patient presents with   [name] is a 75 y.o. male with a history of COPD, diabetes mellitus, hypertension, hyperlipidemia, and prior stroke who presents to the emergency department with complaints of right lower extremity pain status post mechanical fall shortly prior to arrival.  Patient states that he was walking, tripped, and fell onto his right hip.  He might have bumped his head, but denies loss of consciousness.  He is having pain to the right hip/thigh/knee, specifically with movement, has not bared weight since the fall.  No other alleviating or aggravating factors.  He denies headache, neck pain, back pain, chest pain, dyspnea, dizziness, vomiting, or acute numbness/weakness.  He denies anticoagulation use.  Prior to his fall today he was in his usual state of health.  HPI     Past Medical History:  Diagnosis Date   Actinic keratosis    Arthritis    Colitis MAY 2012    CT ABD/PELVIS HEP FLEXURE   COPD (chronic obstructive pulmonary disease) ([place])    Diabetes mellitus without complication ([place])    History of kidney stones    Hyperlipemia    Hypertension    NSAID long-term use NAPROXEN FOR OA   SCL Ca dx'd 08/2018    Patient Active Problem List   Diagnosis Date Noted   Stroke (cerebrum) ([place]) 11/10/2020   Weak 09/24/2020   Ambulatory dysfunction 09/24/2020   Other pulmonary embolism without acute cor pulmonale ([place]) 01/19/2020   Chronic obstructive pulmonary disease ([place]) 01/19/2020   Type 2 diabetes mellitus without complication ([place]) 13/24/4010   Goals of care, counseling/discussion 09/02/2018   Brain metastasis ([place]) 08/29/2018   Small cell carcinoma of lung, right ([place])    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[place]) 08/22/2018   DNR (do not resuscitate) discussion 08/22/2018   SVC  syndrome 08/21/2018   Thoracic ascending aortic aneurysm ([place]) 08/21/2018   Lymphadenopathy 08/21/2018   Medial meniscus tear    Primary osteoarthritis of knee    Synovial plica of knee    Colitis 01/25/2011   Colon cancer screening 01/25/2011    Past Surgical History:  Procedure Laterality Date   BACK SURGERY     spinal stenosis   BASAL CELL CARCINOMA EXCISION  FACE, arms feet, leg   CHOLECYSTECTOMY  JUNE 2011 MJ   STONES, PANCREATITIS   KNEE ARTHROSCOPY WITH MEDIAL MENISECTOMY Right 03/24/2015   Procedure: KNEE ARTHROSCOPY WITH MEDIAL MENISECTOMY;  Surgeon: Carole Civil, MD;  Location: [facility];  Service: Orthopedics;  Laterality: Right;   KNEE SURGERY Left LEFT   arthroscopy   LITHOTRIPSY  80s   PORTACATH PLACEMENT Left 09/11/2018   Procedure: INSERTION PORT-A-CATH;  Surgeon: Virl Cagey, MD;  Location: [facility];  Service: General;  Laterality: Left;   SPINE SURGERY         Family History  Problem Relation Age of Onset   Cancer Mother        lung   Cancer Father        lung and liver   Colon polyps Neg Hx    Colon cancer Neg Hx     Social History   Tobacco Use   Smoking status: Some Days    Packs/day: 1.00    Years: 50.00    Pack years: 50.00    Types:  Cigarettes    Last attempt to quit: 08/07/2018    Years since quitting: 2.5   Smokeless tobacco: Never  Substance Use Topics   Alcohol use: Yes    Comment: once a month   Drug use: No    Home Medications Prior to Admission medications   Medication Sig Start Date End Date Taking? Authorizing Provider  albuterol (PROVENTIL HFA;VENTOLIN HFA) 108 (90 Base) MCG/ACT inhaler Inhale 2 puffs into the lungs every 6 (six) hours as needed for wheezing or shortness of breath. 06/22/18   [provider]  Cholecalciferol 50 MCG (2000 UT) TBDP Take 1 tablet by mouth daily.    [provider]  clopidogrel (PLAVIX) 75 MG tablet Take 1 tablet (75 mg total) by mouth daily. 11/12/20   Ghimire, Henreitta Leber,  MD  levothyroxine (SYNTHROID) 150 MCG tablet Take 150 mcg by mouth daily. 11/10/20   [provider]  Multiple Vitamin (MULTIVITAMIN WITH MINERALS) TABS tablet Take 1 tablet by mouth daily. 09/26/20   Johnson, Clanford L, MD  rosuvastatin (CRESTOR) 20 MG tablet Take 1 tablet (20 mg total) by mouth daily. 11/11/20   Ghimire, Henreitta Leber, MD  thiamine 100 MG tablet Take 1 tablet (100 mg total) by mouth daily. 09/26/20   Johnson, Clanford L, MD  vitamin B-12 1000 MCG tablet Take 1 tablet (1,000 mcg total) by mouth daily. 11/12/20   Ghimire, Henreitta Leber, MD    Allergies    Patient has no known allergies.  Review of Systems   Review of Systems  Constitutional:  Negative for chills and fever.  Respiratory:  Negative for shortness of breath.   Cardiovascular:  Negative for chest pain.  Gastrointestinal:  Negative for abdominal pain, diarrhea, nausea and vomiting.  Genitourinary:  Negative for dysuria.  Musculoskeletal:  Positive for arthralgias and myalgias. Negative for back pain and neck pain.  Skin:  Negative for wound.  Neurological:  Negative for dizziness, syncope, light-headedness, numbness and headaches.  All other systems reviewed and are negative.  Physical Exam Updated Vital Signs BP (!) 162/85 (BP Location: Left Arm)   Pulse 62   Temp 98 F (36.7 C) (Oral)   Resp 17   Ht 5\' 11"  (1.803 m)   Wt 74.8 kg   SpO2 97%   BMI 23.01 kg/m   Physical Exam Vitals and nursing note reviewed.  HENT:     Head: Normocephalic and atraumatic.     Comments: No raccoon eyes or battle sign.    Ears:     Comments: No hemotympanums. Eyes:     Pupils: Pupils are equal, round, and reactive to light.  Cardiovascular:     Rate and Rhythm: Normal rate and regular rhythm.     Comments: 2+ symmetric radial and DP pulses bilaterally. Chest:     Chest wall: No tenderness.  Abdominal:     General: There is no distension.     Palpations: Abdomen is soft.     Tenderness: There is no abdominal  tenderness.  Musculoskeletal:     Cervical back: Neck supple. No tenderness.     Comments: Upper extremities: Patient able to actively range all major joints.  No focal bony tenderness. Back: No midline tenderness or palpable step-off Lower extremities: Patient has intact active range of motion throughout the left lower extremity.  Patient is able to flex his right knee to 90 degrees actively, unable to do straight leg raise off of the stretcher with the RLE. He is tender to palpation to the  right anterior hip, femur as well as the diffuse knee.  Otherwise nontender.  Skin:    General: Skin is warm and dry.  Neurological:     Mental Status: He is alert.     Comments: Sensation grossly intact x 4. Able to plantar/dorsiflex against resistance bilaterally.   Psychiatric:        Mood and Affect: Mood normal.        Behavior: Behavior normal.    ED Results / Procedures / Treatments   Labs (all labs ordered are listed, but only abnormal results are displayed) Labs Reviewed - No data to display  EKG None  Radiology DG Pelvis 1-2 Views  Result Date: 02/08/2021 CLINICAL DATA:  Mechanical fall, right hip pain EXAM: PELVIS - 1-2 VIEW COMPARISON:  None. FINDINGS: There is a mildly displaced transcervical right femoral neck fracture. 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 No additional pelvic ring fractures are identified radiographically on single frontal view. Vascular calcifications. IMPRESSION: Mildly displaced transcervical right femoral neck fracture. Electronically Signed   By: Maurine Simmering   On: 02/08/2021 12:22   CT Head Wo Contrast  Result Date: 02/08/2021 CLINICAL DATA:  Status post fall this morning. 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 EXAM: CT HEAD WITHOUT CONTRAST TECHNIQUE: Contiguous axial images were obtained from the base of the skull through the vertex without intravenous contrast. COMPARISON:  Brain MRI 12/24/2020. FINDINGS: Brain: No evidence  of acute infarction, hemorrhage, hydrocephalus, extra-axial collection or mass lesion/mass effect. Cortical atrophy and extensive chronic microvascular ischemic change are again seen. Also again seen is lacunar infarction in the left basal ganglia. Vascular: No hyperdense vessel or unexpected calcification. Skull: Intact.  No focal lesion. Sinuses/Orbits: Negative. Other: None. IMPRESSION: No acute abnormality. Atrophy and chronic microvascular ischemic change. Electronically Signed   By: Inge Rise M.D.   On: 02/08/2021 12:31   DG Knee Complete 4 Views Right  Result Date: 02/08/2021 CLINICAL DATA:  Fall.  Right hip pain. EXAM: RIGHT KNEE - COMPLETE 4+ VIEW COMPARISON:  None. FINDINGS: No fracture. No subluxation or dislocation. Calcification of the menisci evident. No joint effusion. Mild hypertrophic spurring noted all 3 compartments. IMPRESSION: 1. No acute bony abnormality. 2. 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 Electronically Signed   By: Misty Stanley M.D.   On: 02/08/2021 12:21   DG Femur Min 2 Views Right  Result Date: 02/08/2021 CLINICAL DATA:  Fall, right hip pain EXAM: RIGHT FEMUR 2 VIEWS COMPARISON:  None. FINDINGS: There is a mildly displaced transcervical left femoral neck fracture. There is mild to moderate right hip arthritis. There is no distal femur fracture. Vascular calcifications. IMPRESSION: Mildly displaced transcervical right femoral neck fracture. Electronically Signed   By: Maurine Simmering   On: 02/08/2021 12:21    Procedures Procedures   Medications Ordered in ED Medications  fentaNYL (SUBLIMAZE) injection 50 mcg (has no administration in time range)   ED Course  I have reviewed the triage vital signs and the nursing notes.  Pertinent labs & imaging results that were available during my care of the patient were reviewed by me and considered in my medical decision making (see chart for details).    MDM Rules/Calculators/A&P                           Patient presents to the ED with complaints of right lower extremity pain S/p mechanical fall. Nontoxic, BP elevated. CT head ordered with  potential of head injury, no midline spinal tenderness or chest/abdominal tenderness, plain films of the RLE in areas of tenderness- NVI distally.   Additional history obtained:  Additional history obtained from chart review & nursing note review.   Imaging Studies ordered:  I ordered imaging studies which included CT head & x-rays of the right pelvis/femur/knee, I independently reviewed, formal radiology impression shows:  CT head: No acute abnormality. Atrophy and chronic microvascular ischemic change.  Pelvis & R femur x-ray: Mildly displaced transcervical right femoral neck fracture R knee x-ray: 1. No acute bony abnormality. 2. 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  ED Course:  X-ray with mildly displaced transcervical right femoral neck fx.  12:52: RE-EVAL: Patient updated on results & plan of care, last PO intake was 7AM- ensure, declined pain medication at this time, he is not entirely sure if he is taking plavix currently.   Labs ordered. Consult placed to orthopedic surgery.   12:54: CONSULT: Discussed with orthopedic surgeon Dr. Amedeo Kinsman, admit to medicine, earliest possible OR tomorrow afternoon, might not be until Thursday depending on last dose of Plavix.   13:10: Patient & his wife at bedside updated on results & plan of care.   Lab Tests:  I Ordered, reviewed, and interpreted labs, which included:  CBC: Leukocytosis.  BMP: fairly unremarkable.   13:58: CONSULT: Discussed with hospitalist Dr. Manuella Ghazi- accepts admission.   Findings and plan of care discussed with supervising physician Dr. Kathrynn Humble who is in agreement.   Portions of this note were generated with Lobbyist. Dictation errors may occur despite best attempts at proofreading.  Final Clinical Impression(s) / ED  Diagnoses Final diagnoses:  Fall, initial encounter  Closed fracture of neck of right femur, initial encounter [facility])    Rx / DC Orders ED Discharge Orders     None        Amaryllis Dyke, PA-C 02/08/21 [place], Ankit, MD 02/09/21 [phone]

## 2021-02-08 NOTE — H&P (Signed)
History and Physical    [name] DZH:[id number] DOB: Feb 23, 1946 DOA: 02/08/2021  PCP: Lucia Gaskins, MD   Patient coming from: Home  Chief Complaint: Fall with R hip pain  HPI: [name] is a 75 y.o. male with medical history significant for COPD, type 2 diabetes, hypertension, dyslipidemia, hypothyroidism, and prior CVA who presented to the ED after a mechanical fall at home that resulted in severe right hip pain.  There is no lightheadedness, dizziness, or loss of consciousness and he states that he had bumped his head as well.  No other symptoms have been reported.   ED Course: Stable vital signs noted and patient noted to have some leukocytosis of 14,000 with creatinine 0.6.  Imaging demonstrating right minimally displaced femoral neck fracture.  EKG was sinus rhythm.  Patient refusing pain medications.  Review of Systems: Reviewed as noted above, otherwise negative.  Past Medical History:  Diagnosis Date   Actinic keratosis    Arthritis    Colitis MAY 2012    CT ABD/PELVIS HEP FLEXURE   COPD (chronic obstructive pulmonary disease) (HCC)    Diabetes mellitus without complication ([place])    History of kidney stones    Hyperlipemia    Hypertension    NSAID long-term use NAPROXEN FOR OA   SCL Ca dx'd 08/2018    Past Surgical History:  Procedure Laterality Date   BACK SURGERY     spinal stenosis   BASAL CELL CARCINOMA EXCISION  FACE, arms feet, leg   CHOLECYSTECTOMY  JUNE 2011 MJ   STONES, PANCREATITIS   KNEE ARTHROSCOPY WITH MEDIAL MENISECTOMY Right 03/24/2015   Procedure: KNEE ARTHROSCOPY WITH MEDIAL MENISECTOMY;  Surgeon: Carole Civil, MD;  Location: [facility];  Service: Orthopedics;  Laterality: Right;   KNEE SURGERY Left LEFT   arthroscopy   LITHOTRIPSY  80s   PORTACATH PLACEMENT Left 09/11/2018   Procedure: INSERTION PORT-A-CATH;  Surgeon: Virl Cagey, MD;  Location: [facility];  Service: General;  Laterality: Left;   SPINE SURGERY       reports that he  has been smoking cigarettes. He has a 50.00 pack-year smoking history. He has never used smokeless tobacco. He reports current alcohol use. He reports that he does not use drugs.  No Known Allergies  Family History  Problem Relation Age of Onset   Cancer Mother        lung   Cancer Father        lung and liver   Colon polyps Neg Hx    Colon cancer Neg Hx     Prior to Admission medications   Medication Sig Start Date End Date Taking? Authorizing Provider  albuterol (PROVENTIL HFA;VENTOLIN HFA) 108 (90 Base) MCG/ACT inhaler Inhale 2 puffs into the lungs every 6 (six) hours as needed for wheezing or shortness of breath. 06/22/18   [provider]  Cholecalciferol 50 MCG (2000 UT) TBDP Take 1 tablet by mouth daily.    [provider]  clopidogrel (PLAVIX) 75 MG tablet Take 1 tablet (75 mg total) by mouth daily. 11/12/20   Ghimire, Henreitta Leber, MD  levothyroxine (SYNTHROID) 150 MCG tablet Take 150 mcg by mouth daily. 11/10/20   [provider]  Multiple Vitamin (MULTIVITAMIN WITH MINERALS) TABS tablet Take 1 tablet by mouth daily. 09/26/20   Johnson, Clanford L, MD  rosuvastatin (CRESTOR) 20 MG tablet Take 1 tablet (20 mg total) by mouth daily. 11/11/20   Ghimire, Henreitta Leber, MD  thiamine 100 MG tablet Take  1 tablet (100 mg total) by mouth daily. 09/26/20   Johnson, Clanford L, MD  vitamin B-12 1000 MCG tablet Take 1 tablet (1,000 mcg total) by mouth daily. 11/12/20   Jonetta Osgood, MD    Physical Exam: Vitals:   02/08/21 1104 02/08/21 1106 02/08/21 1130 02/08/21 1340  BP:  (!) 162/85 (!) 165/91 (!) 166/87  Pulse:  62  70  Resp:  17  (!) 28  Temp:  98 F (36.7 C)    TempSrc:  Oral    SpO2:  97%  97%  Weight: 74.8 kg     Height: 5\' 11"  (1.803 m)       Constitutional: Minimal distress, elderly male Vitals:   02/08/21 1104 02/08/21 1106 02/08/21 1130 02/08/21 1340  BP:  (!) 162/85 (!) 165/91 (!) 166/87  Pulse:  62  70  Resp:  17  (!) 28  Temp:  98 F (36.7 C)     TempSrc:  Oral    SpO2:  97%  97%  Weight: 74.8 kg     Height: 5\' 11"  (1.803 m)      Eyes: lids and conjunctivae normal Neck: normal, supple Respiratory: clear to auscultation bilaterally. Normal respiratory effort. No accessory muscle use.  Cardiovascular: Regular rate and rhythm, no murmurs. Abdomen: no tenderness, no distention. Bowel sounds positive.  Musculoskeletal:  No edema. Skin: no rashes, lesions, ulcers.  Psychiatric: Flat affect  Labs on Admission: I have personally reviewed following labs and imaging studies  CBC: Recent Labs  Lab 02/08/21 1306  WBC 14.2*  NEUTROABS 12.3*  HGB 14.4  HCT 44.4  MCV 95.9  PLT 892   Basic Metabolic Panel: Recent Labs  Lab 02/08/21 1306  NA 141  K 4.1  CL 104  CO2 28  GLUCOSE 94  BUN 21  CREATININE 0.60*  CALCIUM 9.6   GFR: Estimated Creatinine Clearance: 84.4 mL/min (A) (by C-G formula based on SCr of 0.6 mg/dL (L)). Liver Function Tests: No results for input(s): AST, ALT, ALKPHOS, BILITOT, PROT, ALBUMIN in the last 168 hours. No results for input(s): LIPASE, AMYLASE in the last 168 hours. No results for input(s): AMMONIA in the last 168 hours. Coagulation Profile: Recent Labs  Lab 02/08/21 1306  INR 1.1   Cardiac Enzymes: No results for input(s): CKTOTAL, CKMB, CKMBINDEX, TROPONINI in the last 168 hours. BNP (last 3 results) No results for input(s): PROBNP in the last 8760 hours. HbA1C: No results for input(s): HGBA1C in the last 72 hours. CBG: No results for input(s): GLUCAP in the last 168 hours. Lipid Profile: No results for input(s): CHOL, HDL, LDLCALC, TRIG, CHOLHDL, LDLDIRECT in the last 72 hours. Thyroid Function Tests: No results for input(s): TSH, T4TOTAL, FREET4, T3FREE, THYROIDAB in the last 72 hours. Anemia Panel: No results for input(s): VITAMINB12, FOLATE, FERRITIN, TIBC, IRON, RETICCTPCT in the last 72 hours. Urine analysis:    Component Value Date/Time   COLORURINE YELLOW 11/10/2020  1425   APPEARANCEUR CLEAR 11/10/2020 1425   LABSPEC 1.013 11/10/2020 1425   PHURINE 5.0 11/10/2020 1425   GLUCOSEU NEGATIVE 11/10/2020 1425   HGBUR SMALL (A) 11/10/2020 1425   BILIRUBINUR NEGATIVE 11/10/2020 1425   KETONESUR NEGATIVE 11/10/2020 1425   PROTEINUR NEGATIVE 11/10/2020 1425   UROBILINOGEN 0.2 12/22/2010 1203   NITRITE NEGATIVE 11/10/2020 1425   LEUKOCYTESUR NEGATIVE 11/10/2020 1425    Radiological Exams on Admission: DG Pelvis 1-2 Views  Result Date: 02/08/2021 CLINICAL DATA:  Mechanical fall, right hip pain EXAM: PELVIS - 1-2 VIEW  COMPARISON:  None. FINDINGS: There is a mildly displaced transcervical right femoral neck fracture. 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 No additional pelvic ring fractures are identified radiographically on single frontal view. Vascular calcifications. IMPRESSION: Mildly displaced transcervical right femoral neck fracture. Electronically Signed   By: Maurine Simmering   On: 02/08/2021 12:22   CT Head Wo Contrast  Result Date: 02/08/2021 CLINICAL DATA:  Status post fall this morning. 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 EXAM: CT HEAD WITHOUT CONTRAST TECHNIQUE: Contiguous axial images were obtained from the base of the skull through the vertex without intravenous contrast. COMPARISON:  Brain MRI 12/24/2020. FINDINGS: Brain: No evidence of acute infarction, hemorrhage, hydrocephalus, extra-axial collection or mass lesion/mass effect. Cortical atrophy and extensive chronic microvascular ischemic change are again seen. Also again seen is lacunar infarction in the left basal ganglia. Vascular: No hyperdense vessel or unexpected calcification. Skull: Intact.  No focal lesion. Sinuses/Orbits: Negative. Other: None. IMPRESSION: No acute abnormality. Atrophy and chronic microvascular ischemic change. Electronically Signed   By: Inge Rise M.D.   On: 02/08/2021 12:31   DG Knee Complete 4 Views Right  Result Date:  02/08/2021 CLINICAL DATA:  Fall.  Right hip pain. EXAM: RIGHT KNEE - COMPLETE 4+ VIEW COMPARISON:  None. FINDINGS: No fracture. No subluxation or dislocation. Calcification of the menisci evident. No joint effusion. Mild hypertrophic spurring noted all 3 compartments. IMPRESSION: 1. No acute bony abnormality. 2. 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 Electronically Signed   By: Misty Stanley M.D.   On: 02/08/2021 12:21   DG Femur Min 2 Views Right  Result Date: 02/08/2021 CLINICAL DATA:  Fall, right hip pain EXAM: RIGHT FEMUR 2 VIEWS COMPARISON:  None. FINDINGS: There is a mildly displaced transcervical left femoral neck fracture. There is mild to moderate right hip arthritis. There is no distal femur fracture. Vascular calcifications. IMPRESSION: Mildly displaced transcervical right femoral neck fracture. Electronically Signed   By: Maurine Simmering   On: 02/08/2021 12:21    EKG: Independently reviewed. SR 67bpm.  Assessment/Plan Active Problems:   Hip fracture (HCC)    Acute right femoral neck fracture status post mechanical fall -Plan to keep n.p.o. after midnight -Pain management -Appreciate orthopedic evaluation -Fall precautions  History of CVA/dyslipidemia -Continue statin -Hold Plavix, uncertain of last dose  History of COPD -Breathing treatments as needed -No acute bronchospasms currently noted  History of diabetes -Blood glucose currently well controlled -Plan to keep on carb modified diet -SSI  Hypothyroidism -Continue home Synthroid   DVT prophylaxis: SCDs Code Status: DNR Family Communication:Sister at bedside  Disposition Plan:Admit for treatment of hip fracture Consults called:Orthopedics Dr. Amedeo Kinsman Admission status: Inpatient, Tele   Ryelynn Guedea D Manuella Ghazi DO [facility]  If 7PM-7AM, please contact night-coverage [email or web address]  02/08/2021, 2:24 PM

## 2021-02-08 NOTE — ED Triage Notes (Signed)
Pt had mechanical fall this morning at home. Pt only c/o right hip pain with movement. Pt unable to stand for EMS.

## 2021-02-09 ENCOUNTER — Inpatient Hospital Stay (HOSPITAL_COMMUNITY): Payer: Medicare Other

## 2021-02-09 DIAGNOSIS — S72001A Fracture of unspecified part of neck of right femur, initial encounter for closed fracture: Secondary | ICD-10-CM

## 2021-02-09 LAB — GLUCOSE, CAPILLARY
Glucose-Capillary: 91 mg/dL (ref 70–99)
Glucose-Capillary: 123 mg/dL — ABNORMAL HIGH (ref 70–99)
Glucose-Capillary: 131 mg/dL — ABNORMAL HIGH (ref 70–99)
Glucose-Capillary: 123 mg/dL — ABNORMAL HIGH (ref 70–99)

## 2021-02-09 LAB — BASIC METABOLIC PANEL
Anion gap: 8 (ref 5–15)
BUN: 22 mg/dL (ref 8–23)
CO2: 27 mmol/L (ref 22–32)
Calcium: 9 mg/dL (ref 8.9–10.3)
Chloride: 103 mmol/L (ref 98–111)
Creatinine, Ser: 0.75 mg/dL (ref 0.61–1.24)
GFR, Estimated: >60 mL/min (ref >60)
Glucose, Bld: 92 mg/dL (ref 70–99)
Potassium: 4 mmol/L (ref 3.5–5.1)
Sodium: 138 mmol/L (ref 135–145)

## 2021-02-09 LAB — HEMOGLOBIN A1C
Hgb A1c MFr Bld: 5.6 % (ref 4.8–5.6)
Mean Plasma Glucose: 114 mg/dL

## 2021-02-09 LAB — CBC
HCT: 39.7 % (ref 39.0–52.0)
Hemoglobin: 13 g/dL (ref 13.0–17.0)
MCH: 31.3 pg (ref 26.0–34.0)
MCHC: 32.7 g/dL (ref 30.0–36.0)
MCV: 95.4 fL (ref 80.0–100.0)
Platelets: 208 10*3/uL (ref 150–400)
RBC: 4.16 MIL/uL — ABNORMAL LOW (ref 4.22–5.81)
RDW: 15.3 % (ref 11.5–15.5)
WBC: 10.4 10*3/uL (ref 4.0–10.5)
nRBC: 0 % (ref 0.0–0.2)

## 2021-02-09 LAB — MAGNESIUM: Magnesium: 2 mg/dL (ref 1.7–2.4)

## 2021-02-09 MED ORDER — LACTATED RINGERS IV SOLN: INTRAVENOUS | Status: AC

## 2021-02-09 MED ORDER — CEFAZOLIN SODIUM-DEXTROSE 2-4 GM/100ML-% IV SOLN
2.0000 g | INTRAVENOUS | Status: AC
  Administered 2021-02-10: 2 g via INTRAVENOUS
  Filled 2021-02-09: qty 100

## 2021-02-09 NOTE — Consult Note (Signed)
ORTHOPAEDIC CONSULTATION  REQUESTING PHYSICIAN: Heath Lark D, DO  ASSESSMENT AND PLAN: 75 y.o. male with the following: Right Hip Valgus impacted femoral neck fracture  This patient requires inpatient admission to the hospitalist, to include preoperative clearance and perioperative medical management  - Weight Bearing Status/Activity: NWB Right lower extremity  - Additional recommended labs/tests: Preop Labs: CBC, BMP, PT/INR, Chest XR, and EKG  -VTE Prophylaxis: Please hold prior to OR; to resume POD#1 at the discretion of the primary team  - Pain control: Recommend PO pain medications PRN; judicious use of narcotics  - Follow-up plan: F/u 10-14 days postop  -Procedures: Plan for OR once patient has been medically optimized  Plan for Right Hip CRPP; 02/10/21 NPO at midnight, preceding surgery  Risks and benefits of surgery, including, but not limited to infection, bleeding, persistent pain, damage to surrounding structures, need for further surgery, malunion, nonunion, avascular necrosis of the femoral head and more severe complications associated with anesthesia were discussed.  All questions have been answered and they have elected to proceed with surgery.       Chief Complaint: Right hip pain  HPI: [name] is a 75 y.o. male who presented to the ED for evaluation after sustaining a mechanical fall.  He states he was standing at home, when he lost his balance and fell.  He does not know why he fell.  He is complaining of right leg pain.  He is comfortable when he is not moving.  He has not been relying on pain medications.  He denies numbness and tingling.  No pain elsewhere.  He did not hit his head.  He has been admitted by the hospitalist in preparation for surgery.   He does not use oxygen at home; no issues with his breathing.   He reports he does not take Plavix, even though it is listed in his chart.   Past Medical History:  Diagnosis Date   Actinic keratosis     Arthritis    Colitis MAY 2012    CT ABD/PELVIS HEP FLEXURE   COPD (chronic obstructive pulmonary disease) (HCC)    Diabetes mellitus without complication ([place])    History of kidney stones    Hyperlipemia    Hypertension    NSAID long-term use NAPROXEN FOR OA   SCL Ca dx'd 08/2018   Past Surgical History:  Procedure Laterality Date   BACK SURGERY     spinal stenosis   BASAL CELL CARCINOMA EXCISION  FACE, arms feet, leg   CHOLECYSTECTOMY  JUNE 2011 MJ   STONES, PANCREATITIS   KNEE ARTHROSCOPY WITH MEDIAL MENISECTOMY Right 03/24/2015   Procedure: KNEE ARTHROSCOPY WITH MEDIAL MENISECTOMY;  Surgeon: Carole Civil, MD;  Location: [facility];  Service: Orthopedics;  Laterality: Right;   KNEE SURGERY Left LEFT   arthroscopy   LITHOTRIPSY  35s   PORTACATH PLACEMENT Left 09/11/2018   Procedure: INSERTION PORT-A-CATH;  Surgeon: Virl Cagey, MD;  Location: [facility];  Service: General;  Laterality: Left;   SPINE SURGERY     Social History   Socioeconomic History   Marital status: Divorced    Spouse name: Not on file   Number of children: 0   Years of education: Not on file   Highest education level: Not on file  Occupational History   Occupation: [place] [employer]    Occupation: Manufacturing engineer   Tobacco Use   Smoking status: Some Days    Packs/day: 1.00    Years: 50.00  Pack years: 50.00    Types: Cigarettes    Last attempt to quit: 08/07/2018    Years since quitting: 2.5   Smokeless tobacco: Never  Substance and Sexual Activity   Alcohol use: Yes    Comment: once a month   Drug use: No   Sexual activity: Never    Birth control/protection: None  Other Topics Concern   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   Social Determinants of Health   Financial Resource Strain: Not on file  Food Insecurity: Not on file  Transportation Needs: Not on file  Physical Activity: Not  on file  Stress: Not on file  Social Connections: Not on file   Family History  Problem Relation Age of Onset   Cancer Mother        lung   Cancer Father        lung and liver   Colon polyps Neg Hx    Colon cancer Neg Hx    No Known Allergies Prior to Admission medications   Medication Sig Start Date End Date Taking? Authorizing Provider  albuterol (PROVENTIL HFA;VENTOLIN HFA) 108 (90 Base) MCG/ACT inhaler Inhale 2 puffs into the lungs every 6 (six) hours as needed for wheezing or shortness of breath. 06/22/18   [provider]  Cholecalciferol 50 MCG (2000 UT) TBDP Take 1 tablet by mouth daily.    [provider]  clopidogrel (PLAVIX) 75 MG tablet Take 1 tablet (75 mg total) by mouth daily. 11/12/20   Ghimire, Henreitta Leber, MD  levothyroxine (SYNTHROID) 150 MCG tablet Take 150 mcg by mouth daily. 11/10/20   [provider]  Multiple Vitamin (MULTIVITAMIN WITH MINERALS) TABS tablet Take 1 tablet by mouth daily. 09/26/20   Johnson, Clanford L, MD  rosuvastatin (CRESTOR) 20 MG tablet Take 1 tablet (20 mg total) by mouth daily. 11/11/20   Ghimire, Henreitta Leber, MD  thiamine 100 MG tablet Take 1 tablet (100 mg total) by mouth daily. 09/26/20   Johnson, Clanford L, MD  vitamin B-12 1000 MCG tablet Take 1 tablet (1,000 mcg total) by mouth daily. 11/12/20   Ghimire, Henreitta Leber, MD   DG Pelvis 1-2 Views  Result Date: 02/08/2021 CLINICAL DATA:  Mechanical fall, right hip pain EXAM: PELVIS - 1-2 VIEW COMPARISON:  None. FINDINGS: There is a mildly displaced transcervical right femoral neck fracture. 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 No additional pelvic ring fractures are identified radiographically on single frontal view. Vascular calcifications. IMPRESSION: Mildly displaced transcervical right femoral neck fracture. Electronically Signed   By: Maurine Simmering   On: 02/08/2021 12:22   CT Head Wo Contrast  Result Date: 02/08/2021 CLINICAL DATA:  Status post fall this  morning. 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 EXAM: CT HEAD WITHOUT CONTRAST TECHNIQUE: Contiguous axial images were obtained from the base of the skull through the vertex without intravenous contrast. COMPARISON:  Brain MRI 12/24/2020. FINDINGS: Brain: No evidence of acute infarction, hemorrhage, hydrocephalus, extra-axial collection or mass lesion/mass effect. Cortical atrophy and extensive chronic microvascular ischemic change are again seen. Also again seen is lacunar infarction in the left basal ganglia. Vascular: No hyperdense vessel or unexpected calcification. Skull: Intact.  No focal lesion. Sinuses/Orbits: Negative. Other: None. IMPRESSION: No acute abnormality. Atrophy and chronic microvascular ischemic change. Electronically Signed   By: Inge Rise M.D.  On: 02/08/2021 12:31   DG Knee Complete 4 Views Right  Result Date: 02/08/2021 CLINICAL DATA:  Fall.  Right hip pain. EXAM: RIGHT KNEE - COMPLETE 4+ VIEW COMPARISON:  None. FINDINGS: No fracture. No subluxation or dislocation. Calcification of the menisci evident. No joint effusion. Mild hypertrophic spurring noted all 3 compartments. IMPRESSION: 1. No acute bony abnormality. 2. 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 Electronically Signed   By: Misty Stanley M.D.   On: 02/08/2021 12:21   DG Femur Min 2 Views Right  Result Date: 02/08/2021 CLINICAL DATA:  Fall, right hip pain EXAM: RIGHT FEMUR 2 VIEWS COMPARISON:  None. FINDINGS: There is a mildly displaced transcervical left femoral neck fracture. There is mild to moderate right hip arthritis. There is no distal femur fracture. Vascular calcifications. IMPRESSION: Mildly displaced transcervical right femoral neck fracture. Electronically Signed   By: Maurine Simmering   On: 02/08/2021 12:21     Family History Reviewed and non-contributory, no pertinent history of problems with bleeding or anesthesia    Review of Systems No fevers or  chills No numbness or tingling No chest pain No shortness of breath No bowel or bladder dysfunction No GI distress No headaches    OBJECTIVE  Vitals:Patient Vitals for the past 8 hrs:  BP Temp Pulse Resp SpO2  02/09/21 0508 122/72 98.4 F (36.9 C) 71 19 93 %    General: Alert, no acute distress Cardiovascular: Extremities are warm Respiratory: No cyanosis, no use of accessory musculature Skin: No lesions in the area of chief complaint  Neurologic: Sensation intact distally  Psychiatric: Patient is competent for consent with normal mood and affect Lymphatic: No swelling obvious and reported other than the area involved in the exam below Extremities  RLE: Extremity held in a fixed position.  ROM deferred due to known fracture.  Sensation is intact distally in the sural, saphenous, DP, SP, and plantar nerve distribution. 2+ DP pulse.  Toes are WWP.  Active motion intact in the TA/EHL/GS. LLE: Sensation is intact distally in the sural, saphenous, DP, SP, and plantar nerve distribution. 2+ DP pulse.  Toes are WWP.  Active motion intact in the TA/EHL/GS. Tolerates gentle ROM of the hip.  No pain with axial loading.     Test Results Imaging XR of the Right hip demonstrates a Valgus impacted femoral neck fracture.  Minimal displacement of the fracture.  Labs cbc Recent Labs    02/08/21 1306 02/09/21 0533  WBC 14.2* 10.4  HGB 14.4 13.0  HCT 44.4 39.7  PLT 233 208    Labs inflam No results for input(s): CRP in the last 72 hours.  Invalid input(s): ESR  Labs coag Recent Labs    02/08/21 1306  INR 1.1    Recent Labs    02/08/21 1306 02/09/21 0533  NA 141 138  K 4.1 4.0  CL 104 103  CO2 28 27  GLUCOSE 94 92  BUN 21 22  CREATININE 0.60* 0.75  CALCIUM 9.6 9.0

## 2021-02-09 NOTE — Progress Notes (Signed)
PROGRESS NOTE    [name]  MAU:[id number] DOB: [birth year] DOA: 02/08/2021 PCP: Lucia Gaskins, MD   Brief Narrative:   [name] is a 75 y.o. male with medical history significant for COPD, type 2 diabetes, hypertension, dyslipidemia, hypothyroidism, and prior CVA who presented to the ED after a mechanical fall at home that resulted in severe right hip pain.  He had been admitted for acute right femoral neck fracture with plans for orthopedic intervention on 7/7.  Assessment & Plan:   Active Problems:   Hip fracture (HCC)   Acute right femoral neck fracture status post mechanical fall -Plan to keep n.p.o. after midnight -Pain management to continue as ordered -Appreciate orthopedic evaluation with plans for operative repair 7/7 -Fall precautions   History of CVA/dyslipidemia -Continue statin -Hold Plavix, apparently does not take this at home   History of COPD -Breathing treatments as needed -No acute bronchospasms currently noted   History of diabetes -Blood glucose currently well controlled -Plan to keep on carb modified diet -SSI   Hypothyroidism -Continue home Synthroid   DVT prophylaxis:SCDs Code Status: DNR Family Communication: Sister at bedside Disposition Plan:  Status is: Inpatient  Remains inpatient appropriate because:Ongoing diagnostic testing needed not appropriate for outpatient work up, IV treatments appropriate due to intensity of illness or inability to take PO, and Inpatient level of care appropriate due to severity of illness  Dispo: The patient is from: Home              Anticipated d/c is to: SNF              Patient currently is not medically stable to d/c.   Difficult to place patient No   Consultants:  Orthopedics  Procedures:  See below  Antimicrobials:  None   Subjective: Patient seen and evaluated today with no new acute complaints or concerns. No acute concerns or events noted overnight. He states his pain is  currently well controlled.  Objective: Vitals:   02/08/21 1535 02/08/21 2048 02/08/21 2340 02/09/21 0508  BP:  (!) 181/99 (!) 170/88 122/72  Pulse:  92 80 71  Resp:  19 19 19   Temp:  98.1 F (36.7 C) 98 F (36.7 C) 98.4 F (36.9 C)  TempSrc:   Oral   SpO2:  93% 96% 93%  Weight: 73.7 kg     Height: 5\' 11"  (1.803 m)       Intake/Output Summary (Last 24 hours) at 02/09/2021 0845 Last data filed at 02/09/2021 0500 Gross per 24 hour  Intake 3 ml  Output 500 ml  Net -497 ml   Filed Weights   02/08/21 1104 02/08/21 1535  Weight: 74.8 kg 73.7 kg    Examination:  General exam: Appears calm and comfortable  Respiratory system: Clear to auscultation. Respiratory effort normal. On [place]. Cardiovascular system: S1 & S2 heard, RRR.  Gastrointestinal system: Abdomen is soft Central nervous system: Alert and awake Extremities: No edema Skin: No significant lesions noted Psychiatry: Flat affect.    Data Reviewed: I have personally reviewed following labs and imaging studies  CBC: Recent Labs  Lab 02/08/21 1306 02/09/21 0533  WBC 14.2* 10.4  NEUTROABS 12.3*  --   HGB 14.4 13.0  HCT 44.4 39.7  MCV 95.9 95.4  PLT 233 563   Basic Metabolic Panel: Recent Labs  Lab 02/08/21 1306 02/09/21 0533  NA 141 138  K 4.1 4.0  CL 104 103  CO2 28 27  GLUCOSE 94 92  BUN  21 22  CREATININE 0.60* 0.75  CALCIUM 9.6 9.0  MG  --  2.0   GFR: Estimated Creatinine Clearance: 83.2 mL/min (by C-G formula based on SCr of 0.75 mg/dL). Liver Function Tests: No results for input(s): AST, ALT, ALKPHOS, BILITOT, PROT, ALBUMIN in the last 168 hours. No results for input(s): LIPASE, AMYLASE in the last 168 hours. No results for input(s): AMMONIA in the last 168 hours. Coagulation Profile: Recent Labs  Lab 02/08/21 1306  INR 1.1   Cardiac Enzymes: No results for input(s): CKTOTAL, CKMB, CKMBINDEX, TROPONINI in the last 168 hours. BNP (last 3 results) No results for input(s): PROBNP in the  last 8760 hours. HbA1C: Recent Labs    02/08/21 1301  HGBA1C 5.6   CBG: Recent Labs  Lab 02/08/21 1625 02/08/21 2048 02/09/21 0750  GLUCAP 103* 97 91   Lipid Profile: No results for input(s): CHOL, HDL, LDLCALC, TRIG, CHOLHDL, LDLDIRECT in the last 72 hours. Thyroid Function Tests: No results for input(s): TSH, T4TOTAL, FREET4, T3FREE, THYROIDAB in the last 72 hours. Anemia Panel: No results for input(s): VITAMINB12, FOLATE, FERRITIN, TIBC, IRON, RETICCTPCT in the last 72 hours. Sepsis Labs: No results for input(s): PROCALCITON, LATICACIDVEN in the last 168 hours.  Recent Results (from the past 240 hour(s))  Resp Panel by RT-PCR (Flu A&B, Covid) Nasopharyngeal Swab     Status: None   Collection Time: 02/08/21  1:53 PM   Specimen: Nasopharyngeal Swab; Nasopharyngeal(NP) swabs in vial transport medium  Result Value Ref Range Status   SARS Coronavirus 2 by RT PCR NEGATIVE NEGATIVE Final    Comment: (NOTE) SARS-CoV-2 target nucleic acids are NOT DETECTED.  The SARS-CoV-2 RNA is generally detectable in upper respiratory specimens during the acute phase of infection. The lowest concentration of SARS-CoV-2 viral copies this assay can detect is 138 copies/mL. A negative result does not preclude SARS-Cov-2 infection and should not be used as the sole basis for treatment or other patient management decisions. A negative result may occur with  improper specimen collection/handling, submission of specimen other than nasopharyngeal swab, presence of viral mutation(s) within the areas targeted by this assay, and inadequate number of viral copies(<138 copies/mL). A negative result must be combined with clinical observations, patient history, and epidemiological information. The expected result is Negative.  Fact Sheet for Patients:  EntrepreneurPulse.com.au  Fact Sheet for Healthcare Providers:  IncredibleEmployment.be  This test is no t yet  approved or cleared by the [place] FDA and  has been authorized for detection and/or diagnosis of SARS-CoV-2 by FDA under an Emergency Use Authorization (EUA). This EUA will remain  in effect (meaning this test can be used) for the duration of the COVID-19 declaration under Section 564(b)(1) of the Act, 21 U.S.C.section 360bbb-3(b)(1), unless the authorization is terminated  or revoked sooner.       Influenza A by PCR NEGATIVE NEGATIVE Final   Influenza B by PCR NEGATIVE NEGATIVE Final    Comment: (NOTE) The Xpert Xpress SARS-CoV-2/FLU/RSV plus assay is intended as an aid in the diagnosis of influenza from Nasopharyngeal swab specimens and should not be used as a sole basis for treatment. Nasal washings and aspirates are unacceptable for Xpert Xpress SARS-CoV-2/FLU/RSV testing.  Fact Sheet for Patients: EntrepreneurPulse.com.au  Fact Sheet for Healthcare Providers: IncredibleEmployment.be  This test is not yet approved or cleared by the [place] FDA and has been authorized for detection and/or diagnosis of SARS-CoV-2 by FDA under an Emergency Use Authorization (EUA). This EUA will remain in effect (meaning  this test can be used) for the duration of the COVID-19 declaration under Section 564(b)(1) of the Act, 21 U.S.C. section 360bbb-3(b)(1), unless the authorization is terminated or revoked.  Performed at [facility], [address] [place], [place] [address]          Radiology Studies: DG Pelvis 1-2 Views  Result Date: 02/08/2021 CLINICAL DATA:  Mechanical fall, right hip pain EXAM: PELVIS - 1-2 VIEW COMPARISON:  None. FINDINGS: There is a mildly displaced transcervical right femoral neck fracture. 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 No additional pelvic ring fractures are identified radiographically on single frontal view. Vascular calcifications. IMPRESSION: Mildly displaced transcervical right femoral neck  fracture. Electronically Signed   By: Maurine Simmering   On: 02/08/2021 12:22   CT Head Wo Contrast  Result Date: 02/08/2021 CLINICAL DATA:  Status post fall this morning. 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 EXAM: CT HEAD WITHOUT CONTRAST TECHNIQUE: Contiguous axial images were obtained from the base of the skull through the vertex without intravenous contrast. COMPARISON:  Brain MRI 12/24/2020. FINDINGS: Brain: No evidence of acute infarction, hemorrhage, hydrocephalus, extra-axial collection or mass lesion/mass effect. Cortical atrophy and extensive chronic microvascular ischemic change are again seen. Also again seen is lacunar infarction in the left basal ganglia. Vascular: No hyperdense vessel or unexpected calcification. Skull: Intact.  No focal lesion. Sinuses/Orbits: Negative. Other: None. IMPRESSION: No acute abnormality. Atrophy and chronic microvascular ischemic change. Electronically Signed   By: Inge Rise M.D.   On: 02/08/2021 12:31   DG Knee Complete 4 Views Right  Result Date: 02/08/2021 CLINICAL DATA:  Fall.  Right hip pain. EXAM: RIGHT KNEE - COMPLETE 4+ VIEW COMPARISON:  None. FINDINGS: No fracture. No subluxation or dislocation. Calcification of the menisci evident. No joint effusion. Mild hypertrophic spurring noted all 3 compartments. IMPRESSION: 1. No acute bony abnormality. 2. 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 Electronically Signed   By: Misty Stanley M.D.   On: 02/08/2021 12:21   DG Femur Min 2 Views Right  Result Date: 02/08/2021 CLINICAL DATA:  Fall, right hip pain EXAM: RIGHT FEMUR 2 VIEWS COMPARISON:  None. FINDINGS: There is a mildly displaced transcervical left femoral neck fracture. There is mild to moderate right hip arthritis. There is no distal femur fracture. Vascular calcifications. IMPRESSION: Mildly displaced transcervical right femoral neck fracture. Electronically Signed   By: Maurine Simmering   On: 02/08/2021  12:21        Scheduled Meds:  cholecalciferol  50 mcg Oral Daily   insulin aspart  0-9 Units Subcutaneous TID WC   levothyroxine  150 mcg Oral Daily   multivitamin with minerals  1 tablet Oral Daily   rosuvastatin  20 mg Oral q1800   sodium chloride flush  3 mL Intravenous Q12H   thiamine  100 mg Oral Daily   cyanocobalamin  1,000 mcg Oral Daily   Continuous Infusions:  sodium chloride     [START ON 02/10/2021]  ceFAZolin (ANCEF) IV     [START ON 02/10/2021] lactated ringers       LOS: 1 day    Time spent: 35 minutes    Thea Holshouser Darleen Crocker, DO [facility]  If 7PM-7AM, please contact night-coverage [email or web address] 02/09/2021, 8:45 AM

## 2021-02-09 NOTE — TOC Initial Note (Signed)
Transition of Care [facility]) - Initial/Assessment Note    Patient Details  Name: [name] MRN: [id number] Date of Birth: 29-Jun-1946  [facility] CM/SW Contact:    Shade Flood, LCSW Phone Number: 02/09/2021, 12:02 PM  Clinical Narrative:                  Pt admitted from home with a hip fracture. Per MD, pt will have surgery tomorrow and likely need SNF rehab after that. TOC will follow up with pt after PT recommendations made to further assist with dc planning.  Expected Discharge Plan: Skilled Nursing Facility Barriers to Discharge: Continued Medical Work up   Patient Goals and CMS Choice Patient states their goals for this hospitalization and ongoing recovery are:: go home CMS Medicare.gov Compare Post Acute Care list provided to:: Patient Choice offered to / list presented to : Patient  Expected Discharge Plan and Services Expected Discharge Plan: [place]       Living arrangements for the past 2 months: Single Family Home                                      Prior Living Arrangements/Services Living arrangements for the past 2 months: Single Family Home Lives with:: Self Patient language and need for interpreter reviewed:: Yes Do you feel safe going back to the place where you live?: Yes      Need for Family Participation in Patient Care: Yes (Comment) Care giver support system in place?: No (comment)   Criminal Activity/Legal Involvement Pertinent to Current Situation/Hospitalization: No - Comment as needed  Activities of Daily Living Home Assistive Devices/Equipment: None ADL Screening (condition at time of admission) Patient's cognitive ability adequate to safely complete daily activities?: Yes Is the patient deaf or have difficulty hearing?: No Does the patient have difficulty seeing, even when wearing glasses/contacts?: No Does the patient have difficulty concentrating, remembering, or making decisions?: No Patient  able to express need for assistance with ADLs?: No Does the patient have difficulty dressing or bathing?: No Independently performs ADLs?: Yes (appropriate for developmental age) Does the patient have difficulty walking or climbing stairs?: No Weakness of Legs: Right Weakness of Arms/Hands: None  Permission Sought/Granted                  Emotional Assessment       Orientation: : Oriented to Self, Oriented to Place, Oriented to  Time, Oriented to Situation Alcohol / Substance Use: Not Applicable Psych Involvement: No (comment)  Admission diagnosis:  Hip fracture ([place]) [S72.009A] Fall [W19.XXXA] Closed fracture of neck of right femur, initial encounter ([place]) [S72.001A] Fall, initial encounter [W19.XXXA] Patient Active Problem List   Diagnosis Date Noted   Hip fracture ([place]) 02/08/2021   Stroke (cerebrum) ([place]) 11/10/2020   Weak 09/24/2020   Ambulatory dysfunction 09/24/2020   Other pulmonary embolism without acute cor pulmonale ([place]) 01/19/2020   Chronic obstructive pulmonary disease ([place]) 01/19/2020   Type 2 diabetes mellitus without complication ([place]) 09/32/6712   Goals of care, counseling/discussion 09/02/2018   Brain metastasis ([place]) 08/29/2018   Small cell carcinoma of lung, right ([place])    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

## 2021-02-09 NOTE — Anesthesia Preprocedure Evaluation (Addendum)
Anesthesia Evaluation  Patient identified by MRN, date of birth, ID band Patient awake and Patient confused    Reviewed: Allergy & Precautions, NPO status , Patient's Chart, lab work & pertinent test results  Airway Mallampati: III  TM Distance: >3 FB Neck ROM: Full    Dental  (+) Dental Advisory Given, Implants   Pulmonary COPD, Current Smoker and Patient abstained from smoking.,    Pulmonary exam normal breath sounds clear to auscultation       Cardiovascular Exercise Tolerance: Poor hypertension, Pt. on medications  Rhythm:Regular Rate:Normal + Systolic murmurs    Neuro/Psych CVA, Residual Symptoms    GI/Hepatic negative GI ROS,   Endo/Other  diabetes (not on meds), Well Controlled, Type 2  Renal/GU      Musculoskeletal  (+) Arthritis , Right hip fx   Abdominal   Peds  Hematology   Anesthesia Other Findings As per patient and family, he never started palvix  Reproductive/Obstetrics                            Anesthesia Physical Anesthesia Plan  ASA: 3  Anesthesia Plan: General/Spinal   Post-op Pain Management:    Induction:   PONV Risk Score and Plan: Ondansetron and Dexamethasone  Airway Management Planned: Nasal Cannula, Natural Airway and Simple Face Mask  Additional Equipment:   Intra-op Plan:   Post-operative Plan:   Informed Consent: I have reviewed the patients History and Physical, chart, labs and discussed the procedure including the risks, benefits and alternatives for the proposed anesthesia with the patient or authorized representative who has indicated his/her understanding and acceptance.    Discussed DNR with patient and Suspend DNR.   Dental advisory given  Plan Discussed with: CRNA and Surgeon  Anesthesia Plan Comments: (Possible GA with airway was discussed)       Anesthesia Quick Evaluation

## 2021-02-10 ENCOUNTER — Inpatient Hospital Stay (HOSPITAL_COMMUNITY): Payer: Medicare Other

## 2021-02-10 ENCOUNTER — Encounter (HOSPITAL_COMMUNITY)
Admission: EM | Disposition: A | Discharge status: Skilled Nursing Facility | Payer: Self-pay | Source: Home / Self Care | Attending: Internal Medicine

## 2021-02-10 ENCOUNTER — Inpatient Hospital Stay (HOSPITAL_COMMUNITY): Payer: Medicare Other | Admitting: Anesthesiology

## 2021-02-10 ENCOUNTER — Encounter (HOSPITAL_COMMUNITY): Payer: Self-pay | Admitting: Internal Medicine

## 2021-02-10 HISTORY — PX: HIP PINNING,CANNULATED: SHX1758

## 2021-02-10 LAB — BASIC METABOLIC PANEL
Anion gap: 9 (ref 5–15)
BUN: 17 mg/dL (ref 8–23)
CO2: 25 mmol/L (ref 22–32)
Calcium: 8.9 mg/dL (ref 8.9–10.3)
Chloride: 103 mmol/L (ref 98–111)
Creatinine, Ser: 0.63 mg/dL (ref 0.61–1.24)
GFR, Estimated: >60 mL/min (ref >60)
Glucose, Bld: 109 mg/dL — ABNORMAL HIGH (ref 70–99)
Potassium: 3.8 mmol/L (ref 3.5–5.1)
Sodium: 137 mmol/L (ref 135–145)

## 2021-02-10 LAB — GLUCOSE, CAPILLARY
Glucose-Capillary: 114 mg/dL — ABNORMAL HIGH (ref 70–99)
Glucose-Capillary: 107 mg/dL — ABNORMAL HIGH (ref 70–99)
Glucose-Capillary: 126 mg/dL — ABNORMAL HIGH (ref 70–99)
Glucose-Capillary: 132 mg/dL — ABNORMAL HIGH (ref 70–99)

## 2021-02-10 LAB — CBC
HCT: 41.8 % (ref 39.0–52.0)
Hemoglobin: 13.7 g/dL (ref 13.0–17.0)
MCH: 31 pg (ref 26.0–34.0)
MCHC: 32.8 g/dL (ref 30.0–36.0)
MCV: 94.6 fL (ref 80.0–100.0)
Platelets: 195 10*3/uL (ref 150–400)
RBC: 4.42 MIL/uL (ref 4.22–5.81)
RDW: 15.1 % (ref 11.5–15.5)
WBC: 10.1 10*3/uL (ref 4.0–10.5)
nRBC: 0 % (ref 0.0–0.2)

## 2021-02-10 LAB — MAGNESIUM: Magnesium: 2.1 mg/dL (ref 1.7–2.4)

## 2021-02-10 LAB — SURGICAL PCR SCREEN
MRSA, PCR: NEGATIVE
Staphylococcus aureus: NEGATIVE

## 2021-02-10 LAB — PROTIME-INR
INR: 1.1 (ref 0.8–1.2)
Prothrombin Time: 14.4 seconds (ref 11.4–15.2)

## 2021-02-10 SURGERY — FIXATION, FEMUR, NECK, PERCUTANEOUS, USING SCREW
Anesthesia: Spinal | Site: Hip | Laterality: Right

## 2021-02-10 MED ORDER — LACTATED RINGERS IV SOLN: INTRAVENOUS | Status: DC

## 2021-02-10 MED ORDER — FENTANYL CITRATE (PF) 100 MCG/2ML IJ SOLN: INTRAMUSCULAR | Status: DC | PRN

## 2021-02-10 MED ORDER — LIDOCAINE HCL (PF) 2 % IJ SOLN
INTRAMUSCULAR | Status: AC
  Filled 2021-02-10: qty 5

## 2021-02-10 MED ORDER — FENTANYL CITRATE (PF) 100 MCG/2ML IJ SOLN
INTRAMUSCULAR | Status: AC
  Filled 2021-02-10: qty 2

## 2021-02-10 MED ORDER — ONDANSETRON HCL 4 MG/2ML IJ SOLN
INTRAMUSCULAR | Status: AC
  Filled 2021-02-10: qty 2

## 2021-02-10 MED ORDER — 0.9 % SODIUM CHLORIDE (POUR BTL) OPTIME
TOPICAL | Status: DC | PRN
  Administered 2021-02-10: 1000 mL

## 2021-02-10 MED ORDER — BUPIVACAINE HCL (PF) 0.5 % IJ SOLN
INTRAMUSCULAR | Status: DC | PRN
  Administered 2021-02-10: 2 mL via INTRATHECAL

## 2021-02-10 MED ORDER — ORAL CARE MOUTH RINSE: 15.0000 mL | Freq: Once | OROMUCOSAL | Status: AC

## 2021-02-10 MED ORDER — CHLORHEXIDINE GLUCONATE 0.12 % MT SOLN
15.0000 mL | Freq: Once | OROMUCOSAL | Status: AC
  Administered 2021-02-10: 15 mL via OROMUCOSAL

## 2021-02-10 MED ORDER — PHENYLEPHRINE HCL-NACL 10-0.9 MG/250ML-% IV SOLN
INTRAVENOUS | Status: AC
  Filled 2021-02-10: qty 250

## 2021-02-10 MED ORDER — CEFAZOLIN SODIUM-DEXTROSE 2-4 GM/100ML-% IV SOLN
2.0000 g | Freq: Three times a day (TID) | INTRAVENOUS | Status: AC
  Administered 2021-02-10 – 2021-02-11 (×3): 2 g via INTRAVENOUS
  Filled 2021-02-10 (×3): qty 100

## 2021-02-10 MED ORDER — BUPIVACAINE HCL (PF) 0.5 % IJ SOLN
INTRAMUSCULAR | Status: DC | PRN
  Administered 2021-02-10: 30 mL

## 2021-02-10 MED ORDER — FENTANYL CITRATE (PF) 100 MCG/2ML IJ SOLN
INTRAMUSCULAR | Status: DC | PRN
  Administered 2021-02-10: 15 ug via INTRATHECAL

## 2021-02-10 MED ORDER — PROPOFOL 500 MG/50ML IV EMUL
INTRAVENOUS | Status: DC | PRN
  Administered 2021-02-10: 50 ug/kg/min via INTRAVENOUS

## 2021-02-10 MED ORDER — DEXAMETHASONE SODIUM PHOSPHATE 10 MG/ML IJ SOLN
INTRAMUSCULAR | Status: AC
  Filled 2021-02-10: qty 1

## 2021-02-10 MED ORDER — LIDOCAINE 2% (20 MG/ML) 5 ML SYRINGE
INTRAMUSCULAR | Status: DC | PRN
  Administered 2021-02-10: 40 mg via INTRAVENOUS

## 2021-02-10 MED ORDER — DEXAMETHASONE SODIUM PHOSPHATE 4 MG/ML IJ SOLN
INTRAMUSCULAR | Status: DC | PRN
  Administered 2021-02-10: 5 mg via INTRAVENOUS

## 2021-02-10 MED ORDER — FENTANYL CITRATE (PF) 100 MCG/2ML IJ SOLN
INTRAMUSCULAR | Status: DC | PRN
  Administered 2021-02-10: 50 ug via INTRAVENOUS

## 2021-02-10 MED ORDER — BUPIVACAINE HCL (PF) 0.5 % IJ SOLN
INTRAMUSCULAR | Status: AC
  Filled 2021-02-10: qty 30

## 2021-02-10 MED ORDER — CHLORHEXIDINE GLUCONATE 0.12 % MT SOLN
OROMUCOSAL | Status: AC
  Filled 2021-02-10: qty 15

## 2021-02-10 MED ORDER — PROPOFOL 10 MG/ML IV BOLUS
INTRAVENOUS | Status: DC | PRN
  Administered 2021-02-10 (×2): 20 mg via INTRAVENOUS

## 2021-02-10 MED ORDER — ONDANSETRON HCL 4 MG/2ML IJ SOLN
INTRAMUSCULAR | Status: DC | PRN
  Administered 2021-02-10: 4 mg via INTRAVENOUS

## 2021-02-10 MED ORDER — CHLORHEXIDINE GLUCONATE CLOTH 2 % EX PADS
6.0000 | MEDICATED_PAD | Freq: Every day | CUTANEOUS | Status: DC
  Administered 2021-02-10 – 2021-02-13 (×4): 6 via TOPICAL

## 2021-02-10 MED ORDER — DEXMEDETOMIDINE (PRECEDEX) IN NS 20 MCG/5ML (4 MCG/ML) IV SYRINGE
PREFILLED_SYRINGE | INTRAVENOUS | Status: AC
  Filled 2021-02-10: qty 5

## 2021-02-10 MED ORDER — ONDANSETRON HCL 4 MG/2ML IJ SOLN: 4.0000 mg | Freq: Once | INTRAMUSCULAR | Status: DC | PRN

## 2021-02-10 MED ORDER — PROPOFOL 10 MG/ML IV BOLUS
INTRAVENOUS | Status: AC
  Filled 2021-02-10: qty 20

## 2021-02-10 MED ORDER — FENTANYL CITRATE (PF) 100 MCG/2ML IJ SOLN
25.0000 ug | Freq: Once | INTRAMUSCULAR | Status: AC
Start: 2021-02-10 — End: 2021-02-10
  Administered 2021-02-10: 25 ug via INTRAVENOUS

## 2021-02-10 MED ORDER — HYDROMORPHONE HCL 1 MG/ML IJ SOLN: 0.2500 mg | INTRAMUSCULAR | Status: DC | PRN

## 2021-02-10 SURGICAL SUPPLY — 49 items
APL PRP STRL LF DISP 70% ISPRP (MISCELLANEOUS) ×1
BAG HAMPER (MISCELLANEOUS) ×2 IMPLANT
BIT DRILL 4.8X300 (BIT) IMPLANT
BLADE SURG SZ10 CARB STEEL (BLADE) ×4 IMPLANT
BNDG GAUZE ELAST 4 BULKY (GAUZE/BANDAGES/DRESSINGS) ×2 IMPLANT
CHLORAPREP W/TINT 26 (MISCELLANEOUS) ×2 IMPLANT
CLOSURE STERI STRIP 1/2 X4 (GAUZE/BANDAGES/DRESSINGS) ×1 IMPLANT
CLOTH BEACON ORANGE TIMEOUT ST (SAFETY) ×2 IMPLANT
COVER LIGHT HANDLE STERIS (MISCELLANEOUS) ×4 IMPLANT
COVER MAYO STAND XLG (MISCELLANEOUS) ×1 IMPLANT
COVER PERINEAL POST (MISCELLANEOUS) ×2 IMPLANT
DECANTER SPIKE VIAL GLASS SM (MISCELLANEOUS) ×3 IMPLANT
DRAPE STERI IOBAN 125X83 (DRAPES) ×2 IMPLANT
DRSG MEPILEX SACRM 8.7X9.8 (GAUZE/BANDAGES/DRESSINGS) ×2 IMPLANT
DRSG TEGADERM 2-3/8X2-3/4 SM (GAUZE/BANDAGES/DRESSINGS) ×2 IMPLANT
DRSG TEGADERM 4X4.75 (GAUZE/BANDAGES/DRESSINGS) ×4 IMPLANT
GAUZE SPONGE 4X4 12PLY STRL (GAUZE/BANDAGES/DRESSINGS) ×1 IMPLANT
GLOVE SKINSENSE NS SZ8.0 LF (GLOVE) ×2
GLOVE SKINSENSE STRL SZ8.0 LF (GLOVE) ×2 IMPLANT
GLOVE SRG 8 PF TXTR STRL LF DI (GLOVE) ×1 IMPLANT
GLOVE SURG UNDER POLY LF SZ7 (GLOVE) ×4 IMPLANT
GLOVE SURG UNDER POLY LF SZ8 (GLOVE) ×2
GOWN STRL REUS W/ TWL XL LVL3 (GOWN DISPOSABLE) ×1 IMPLANT
GOWN STRL REUS W/TWL LRG LVL3 (GOWN DISPOSABLE) ×4 IMPLANT
GOWN STRL REUS W/TWL XL LVL3 (GOWN DISPOSABLE) ×2
INST SET MAJOR BONE (KITS) ×2 IMPLANT
KIT BLADEGUARD II DBL (SET/KITS/TRAYS/PACK) ×2 IMPLANT
KIT TURNOVER CYSTO (KITS) ×2 IMPLANT
MANIFOLD NEPTUNE II (INSTRUMENTS) ×2 IMPLANT
MARKER SKIN DUAL TIP RULER LAB (MISCELLANEOUS) ×2 IMPLANT
NDL HYPO 21X1.5 SAFETY (NEEDLE) ×1 IMPLANT
NEEDLE HYPO 21X1.5 SAFETY (NEEDLE) ×2 IMPLANT
NS IRRIG 1000ML POUR BTL (IV SOLUTION) ×2 IMPLANT
PACK BASIC III (CUSTOM PROCEDURE TRAY) ×2
PACK SRG BSC III STRL LF ECLPS (CUSTOM PROCEDURE TRAY) ×1 IMPLANT
PENCIL SMOKE EVACUATOR COATED (MISCELLANEOUS) ×2 IMPLANT
PIN GUIDE DRILL TIP 2.8X300 (DRILL) IMPLANT
PIN GUIDE THRD AR 3.2X330 (PIN) ×3 IMPLANT
PLATE DISTAL 4 HOLE (Plate) ×3 IMPLANT
SET BASIN LINEN APH (SET/KITS/TRAYS/PACK) ×2 IMPLANT
SPONGE T-LAP 18X18 ~~LOC~~+RFID (SPONGE) ×4 IMPLANT
SUT MNCRL AB 4-0 PS2 18 (SUTURE) ×2 IMPLANT
SUT MON AB 2-0 CT1 36 (SUTURE) ×2 IMPLANT
SUT VIC AB 0 CT1 27 (SUTURE) ×2
SUT VIC AB 0 CT1 27XBRD ANTBC (SUTURE) ×1 IMPLANT
SYR 30ML LL (SYRINGE) ×2 IMPLANT
SYR BULB IRRIG 60ML STRL (SYRINGE) ×4 IMPLANT
WASHER FLAT 7.0 (Washer) ×3 IMPLANT
YANKAUER SUCT BULB TIP 10FT TU (MISCELLANEOUS) ×2 IMPLANT

## 2021-02-10 NOTE — Anesthesia Procedure Notes (Signed)
Spinal  Patient location during procedure: OR Start time: 02/10/2021 12:38 PM End time: 02/10/2021 12:48 PM Reason for block: surgical anesthesia Staffing Performed: anesthesiologist  Anesthesiologist: Denese Killings, MD Preanesthetic Checklist Completed: patient identified, IV checked, site marked, risks and benefits discussed, surgical consent, monitors and equipment checked, pre-op evaluation and timeout performed Spinal Block Patient position: left lateral decubitus Prep: Betadine and ChloraPrep Patient monitoring: heart rate, cardiac monitor, continuous pulse ox and blood pressure Approach: midline Location: L3-4 Injection technique: single-shot Needle Needle type: Sprotte and Spinocan  Needle gauge: 24 G Needle length: 9 cm Needle insertion depth: 5 cm Assessment Events: CSF return

## 2021-02-10 NOTE — Progress Notes (Signed)
PROGRESS NOTE    [name]  ZHY:[id number] DOB: 1946/04/28 DOA: 02/08/2021 PCP: Lucia Gaskins, MD   Brief Narrative:   [name] is a 75 y.o. male with medical history significant for COPD, type 2 diabetes, hypertension, dyslipidemia, hypothyroidism, and prior CVA who presented to the ED after a mechanical fall at home that resulted in severe right hip pain.  He had been admitted for acute right femoral neck fracture with plans for orthopedic intervention on 7/7.  Assessment & Plan:   Active Problems:   Hip fracture (HCC)   Acute right femoral neck fracture status post mechanical fall -Has been n.p.o. after midnight -Pain management to continue as ordered -Appreciate orthopedic evaluation with plans for operative repair 7/7 -Fall precautions -PT evaluation starting 7/8 with likely need for SNF   History of CVA/dyslipidemia -Continue statin -Hold Plavix, apparently does not take this at home   History of COPD -Breathing treatments as needed -No acute bronchospasms currently noted   History of diabetes -Blood glucose currently well controlled -Plan to keep on carb modified diet after surgery -SSI   Hypothyroidism -Continue home Synthroid     DVT prophylaxis:SCDs Code Status: DNR Family Communication: Sister at bedside 7/7 Disposition Plan:  Status is: Inpatient   Remains inpatient appropriate because:Ongoing diagnostic testing needed not appropriate for outpatient work up, IV treatments appropriate due to intensity of illness or inability to take PO, and Inpatient level of care appropriate due to severity of illness   Dispo: The patient is from: Home              Anticipated d/c is to: SNF              Patient currently is not medically stable to d/c.              Difficult to place patient No     Consultants:  Orthopedics   Procedures:  See below   Antimicrobials:  None  Subjective: Patient seen and evaluated today with no new acute complaints  or concerns. No acute concerns or events noted overnight.  He complains of minimal pain.  Objective: Vitals:   02/09/21 0508 02/09/21 1246 02/09/21 2106 02/10/21 0436  BP: 122/72 134/89 (!) 136/97 (!) 139/92  Pulse: 71 72 80 75  Resp: 19 18 19 18   Temp: 98.4 F (36.9 C) 98 F (36.7 C) 98.1 F (36.7 C) 98 F (36.7 C)  TempSrc:  Oral    SpO2: 93% 95% 94% 94%  Weight:      Height:        Intake/Output Summary (Last 24 hours) at 02/10/2021 0946 Last data filed at 02/10/2021 0500 Gross per 24 hour  Intake 553.2 ml  Output 950 ml  Net -396.8 ml   Filed Weights   02/08/21 1104 02/08/21 1535  Weight: 74.8 kg 73.7 kg    Examination:  General exam: Appears calm and comfortable  Respiratory system: Clear to auscultation. Respiratory effort normal. Cardiovascular system: S1 & S2 heard, RRR.  Gastrointestinal system: Abdomen is soft Central nervous system: Alert and awake Extremities: No edema Skin: No significant lesions noted Psychiatry: Flat affect.    Data Reviewed: I have personally reviewed following labs and imaging studies  CBC: Recent Labs  Lab 02/08/21 1306 02/09/21 0533 02/10/21 0608  WBC 14.2* 10.4 10.1  NEUTROABS 12.3*  --   --   HGB 14.4 13.0 13.7  HCT 44.4 39.7 41.8  MCV 95.9 95.4 94.6  PLT 233 208 195  Basic Metabolic Panel: Recent Labs  Lab 02/08/21 1306 02/09/21 0533 02/10/21 0608  NA 141 138 137  K 4.1 4.0 3.8  CL 104 103 103  CO2 28 27 25   GLUCOSE 94 92 109*  BUN 21 22 17   CREATININE 0.60* 0.75 0.63  CALCIUM 9.6 9.0 8.9  MG  --  2.0 2.1   GFR: Estimated Creatinine Clearance: 83.2 mL/min (by C-G formula based on SCr of 0.63 mg/dL). Liver Function Tests: No results for input(s): AST, ALT, ALKPHOS, BILITOT, PROT, ALBUMIN in the last 168 hours. No results for input(s): LIPASE, AMYLASE in the last 168 hours. No results for input(s): AMMONIA in the last 168 hours. Coagulation Profile: Recent Labs  Lab 02/08/21 1306 02/10/21 0608  INR  1.1 1.1   Cardiac Enzymes: No results for input(s): CKTOTAL, CKMB, CKMBINDEX, TROPONINI in the last 168 hours. BNP (last 3 results) No results for input(s): PROBNP in the last 8760 hours. HbA1C: Recent Labs    02/08/21 1301  HGBA1C 5.6   CBG: Recent Labs  Lab 02/09/21 0750 02/09/21 1107 02/09/21 1720 02/09/21 2106 02/10/21 0727  GLUCAP 91 123* 131* 123* 114*   Lipid Profile: No results for input(s): CHOL, HDL, LDLCALC, TRIG, CHOLHDL, LDLDIRECT in the last 72 hours. Thyroid Function Tests: No results for input(s): TSH, T4TOTAL, FREET4, T3FREE, THYROIDAB in the last 72 hours. Anemia Panel: No results for input(s): VITAMINB12, FOLATE, FERRITIN, TIBC, IRON, RETICCTPCT in the last 72 hours. Sepsis Labs: No results for input(s): PROCALCITON, LATICACIDVEN in the last 168 hours.  Recent Results (from the past 240 hour(s))  Resp Panel by RT-PCR (Flu A&B, Covid) Nasopharyngeal Swab     Status: None   Collection Time: 02/08/21  1:53 PM   Specimen: Nasopharyngeal Swab; Nasopharyngeal(NP) swabs in vial transport medium  Result Value Ref Range Status   SARS Coronavirus 2 by RT PCR NEGATIVE NEGATIVE Final    Comment: (NOTE) SARS-CoV-2 target nucleic acids are NOT DETECTED.  The SARS-CoV-2 RNA is generally detectable in upper respiratory specimens during the acute phase of infection. The lowest concentration of SARS-CoV-2 viral copies this assay can detect is 138 copies/mL. A negative result does not preclude SARS-Cov-2 infection and should not be used as the sole basis for treatment or other patient management decisions. A negative result may occur with  improper specimen collection/handling, submission of specimen other than nasopharyngeal swab, presence of viral mutation(s) within the areas targeted by this assay, and inadequate number of viral copies(<138 copies/mL). A negative result must be combined with clinical observations, patient history, and epidemiological information.  The expected result is Negative.  Fact Sheet for Patients:  EntrepreneurPulse.com.au  Fact Sheet for Healthcare Providers:  IncredibleEmployment.be  This test is no t yet approved or cleared by the [place] FDA and  has been authorized for detection and/or diagnosis of SARS-CoV-2 by FDA under an Emergency Use Authorization (EUA). This EUA will remain  in effect (meaning this test can be used) for the duration of the COVID-19 declaration under Section 564(b)(1) of the Act, 21 U.S.C.section 360bbb-3(b)(1), unless the authorization is terminated  or revoked sooner.       Influenza A by PCR NEGATIVE NEGATIVE Final   Influenza B by PCR NEGATIVE NEGATIVE Final    Comment: (NOTE) The Xpert Xpress SARS-CoV-2/FLU/RSV plus assay is intended as an aid in the diagnosis of influenza from Nasopharyngeal swab specimens and should not be used as a sole basis for treatment. Nasal washings and aspirates are unacceptable for Xpert Xpress SARS-CoV-2/FLU/RSV  testing.  Fact Sheet for Patients: EntrepreneurPulse.com.au  Fact Sheet for Healthcare Providers: IncredibleEmployment.be  This test is not yet approved or cleared by the [place] FDA and has been authorized for detection and/or diagnosis of SARS-CoV-2 by FDA under an Emergency Use Authorization (EUA). This EUA will remain in effect (meaning this test can be used) for the duration of the COVID-19 declaration under Section 564(b)(1) of the Act, 21 U.S.C. section 360bbb-3(b)(1), unless the authorization is terminated or revoked.  Performed at [facility], [address] [place], [place] [address]   Surgical pcr screen     Status: None   Collection Time: 02/10/21  4:09 AM   Specimen: Nasal Mucosa; Nasal Swab  Result Value Ref Range Status   MRSA, PCR NEGATIVE NEGATIVE Final   Staphylococcus aureus NEGATIVE NEGATIVE Final    Comment: (NOTE) The Xpert SA  Assay (FDA approved for NASAL specimens in patients 24 years of age and older), is one component of a comprehensive surveillance program. It is not intended to diagnose infection nor to guide or monitor treatment. Performed at [facility], [address], [place], [place] [address]          Radiology Studies: DG Pelvis 1-2 Views  Result Date: 02/08/2021 CLINICAL DATA:  Mechanical fall, right hip pain EXAM: PELVIS - 1-2 VIEW COMPARISON:  None. FINDINGS: There is a mildly displaced transcervical right femoral neck fracture. 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 No additional pelvic ring fractures are identified radiographically on single frontal view. Vascular calcifications. IMPRESSION: Mildly displaced transcervical right femoral neck fracture. Electronically Signed   By: Maurine Simmering   On: 02/08/2021 12:22   CT Head Wo Contrast  Result Date: 02/08/2021 CLINICAL DATA:  Status post fall this morning. 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 EXAM: CT HEAD WITHOUT CONTRAST TECHNIQUE: Contiguous axial images were obtained from the base of the skull through the vertex without intravenous contrast. COMPARISON:  Brain MRI 12/24/2020. FINDINGS: Brain: No evidence of acute infarction, hemorrhage, hydrocephalus, extra-axial collection or mass lesion/mass effect. Cortical atrophy and extensive chronic microvascular ischemic change are again seen. Also again seen is lacunar infarction in the left basal ganglia. Vascular: No hyperdense vessel or unexpected calcification. Skull: Intact.  No focal lesion. Sinuses/Orbits: Negative. Other: None. IMPRESSION: No acute abnormality. Atrophy and chronic microvascular ischemic change. Electronically Signed   By: Inge Rise M.D.   On: 02/08/2021 12:31   DG Chest Port 1 View  Result Date: 02/09/2021 CLINICAL DATA:  Fall. EXAM: PORTABLE CHEST 1 VIEW COMPARISON:  CT 12/24/2020.  Chest x-ray 09/24/2020. FINDINGS: PowerPort catheter in  stable position. Stable cardiomegaly. Stable bilateral pleural-parenchymal thickening most consistent scarring. No acute infiltrate. No pleural effusion or pneumothorax. No displaced rib fracture noted. IMPRESSION: 1.  PowerPort catheter stable position. 2.  Stable cardiomegaly. 3. Stable changes of bilateral pleural-parenchymal scarring. No evidence of displaced rib fracture or pneumothorax. Electronically Signed   By: Marcello Moores  Register   On: 02/09/2021 10:43   DG Knee Complete 4 Views Right  Result Date: 02/08/2021 CLINICAL DATA:  Fall.  Right hip pain. EXAM: RIGHT KNEE - COMPLETE 4+ VIEW COMPARISON:  None. FINDINGS: No fracture. No subluxation or dislocation. Calcification of the menisci evident. No joint effusion. Mild hypertrophic spurring noted all 3 compartments. IMPRESSION: 1. No acute bony abnormality. 2. 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 Electronically Signed   By: Misty Stanley M.D.   On: 02/08/2021 12:21   DG Femur Min 2 Views Right  Result Date: 02/08/2021 CLINICAL DATA:  Fall, right hip pain EXAM: RIGHT FEMUR 2 VIEWS COMPARISON:  None. FINDINGS: There is a mildly displaced transcervical left femoral neck fracture. There is mild to moderate right hip arthritis. There is no distal femur fracture. Vascular calcifications. IMPRESSION: Mildly displaced transcervical right femoral neck fracture. Electronically Signed   By: Maurine Simmering   On: 02/08/2021 12:21        Scheduled Meds:  cholecalciferol  50 mcg Oral Daily   insulin aspart  0-9 Units Subcutaneous TID WC   levothyroxine  150 mcg Oral Daily   multivitamin with minerals  1 tablet Oral Daily   rosuvastatin  20 mg Oral q1800   sodium chloride flush  3 mL Intravenous Q12H   thiamine  100 mg Oral Daily   cyanocobalamin  1,000 mcg Oral Daily   Continuous Infusions:  sodium chloride      ceFAZolin (ANCEF) IV     lactated ringers 75 mL/hr at 02/10/21 0201     LOS: 2 days    Time spent: 35  minutes    Latif Nazareno Darleen Crocker, DO [facility]  If 7PM-7AM, please contact night-coverage [email or web address] 02/10/2021, 9:46 AM

## 2021-02-10 NOTE — Addendum Note (Signed)
Addendum  created 02/10/21 1544 by Denese Killings, MD   Clinical Note Signed

## 2021-02-10 NOTE — Anesthesia Postprocedure Evaluation (Addendum)
Anesthesia Post Note  Patient: [name]  Procedure(s) Performed: CANNULATED HIP PINNING (Right: Hip)  Anesthesia Type: Combined General/Spinal Level of consciousness: awake and alert, oriented and sedated Pain management: pain level controlled Vital Signs Assessment: post-procedure vital signs reviewed and stable Respiratory status: spontaneous breathing and respiratory function stable Cardiovascular status: blood pressure returned to baseline and stable Postop Assessment: no apparent nausea or vomiting Anesthetic complications: no Comments: When PACU staff were about to give report to floor nurse to transfer, Patient started become agitated, confused and wants go out to eat breakfast, , vital signs stable, precedex 20 mg iv in divided doses was given, his mentation improved more cooperative. Transferred to floor.   No notable events documented.   Last Vitals:  Vitals:   02/10/21 1430 02/10/21 1445  BP: (!) 143/83 (!) 150/83  Pulse: 69 68  Resp: 16 20  Temp:    SpO2: 93% 99%    Last Pain:  Vitals:   02/10/21 1415  TempSrc:   PainSc: 0-No pain                 Acheron Sugg C Deavon Podgorski

## 2021-02-10 NOTE — Transfer of Care (Signed)
Immediate Anesthesia Transfer of Care Note  Patient: [name]  Procedure(s) Performed: CANNULATED HIP PINNING (Right: Hip)  Patient Location: PACU  Anesthesia Type:General and Spinal  Level of Consciousness: awake  Airway & Oxygen Therapy: Patient Spontanous Breathing  Post-op Assessment: Report given to RN and Post -op Vital signs reviewed and stable  Post vital signs: Reviewed and stable  Last Vitals:  Vitals Value Taken Time  BP 136/93 02/10/21 1415  Temp    Pulse 80 02/10/21 1417  Resp 20 02/10/21 1417  SpO2 96 % 02/10/21 1417  Vitals shown include unvalidated device data.  Last Pain:  Vitals:   02/10/21 1131  TempSrc: Oral  PainSc: 0-No pain      Patients Stated Pain Goal: 2 (54/36/06 7703)  Complications: No notable events documented.

## 2021-02-10 NOTE — Progress Notes (Signed)
1500 patient wanting to get out of bed and is agitated pulling off monitor leads. Dr. Charna Zahava Quant notified and at bedside. Patient received 64mcg precedex at bedside. Sister at bedside per OK of charge nurse.  Patient oriented x4 and knows sister.

## 2021-02-10 NOTE — Progress Notes (Addendum)
   ORTHOPAEDIC PROGRESS NOTE  Scheduled for: CRPP R femoral neck fracture  DOS: 02/10/21  SUBJECTIVE: No issues over night.  Pain is controlled.  NPO since midnight.  Consent signed.  All questions answered.  OBJECTIVE: PE:  Alert and oriented, no acute distress  Right hip is externally rotated Small laceration over anterior tibia Toes are warm and well perfused Active motion intact in the TA/EHL Sensation intact over the dorsum of his foot.   Vitals:   02/09/21 2106 02/10/21 0436  BP: (!) 136/97 (!) 139/92  Pulse: 80 75  Resp: 19 18  Temp: 98.1 F (36.7 C) 98 F (36.7 C)  SpO2: 94% 94%     ASSESSMENT: [name] is a 75 y.o. male doing well, ready for OR today.  NPO since midnight.  PLAN: Weightbearing: NWB RLE Insicional and dressing care: Reinforce dressings as needed; none currently Orthopedic device(s): None VTE prophylaxis: At discretion of primary team; recommend 81 mg aspirin BID Pain control: PO medications, PRN.  Judicious use of narcotics Follow - up plan: 2 weeks postop   Contact information:     Williette Loewe A. Amedeo Kinsman, MD [place] [address] [place],  [place]  [address] Phone: [phone] Fax: [phone]

## 2021-02-10 NOTE — Op Note (Signed)
Orthopaedic Surgery Operative Note (CSN: [id number])  [name]  1946/04/01 Date of Surgery: 02/10/2021   Diagnoses:  Right femoral neck fracture  Procedure: CRPP of the Right valgus impacted, femoral neck fracture   Operative Finding Successful completion of the planned procedure.  Placement of 3 x 7.3 mm cannulated screws with washers to stabilize the Valgus impacted femoral neck fracture   Post-Op Diagnosis: Same Surgeons:Primary: Mordecai Rasmussen, MD Assistants: None Location: [facility] OR ROOM 4 Anesthesia: Sedation plus regional anesthesia Antibiotics: Ancef 2 g Tourniquet time: N/A Estimated Blood Loss: 50 cc Complications: None Specimens: None Implants: Implant Name Type Inv. Item Serial No. Manufacturer Lot No. LRB No. Used Action  7.0 X 100 Cannulated Screw    [employer] STERILE ON SET Right 3 Implanted  WASHER FLAT 7.0 - [id number] Washer WASHER FLAT 7.0  [employer] STERILE ON SET Right 3 Implanted    Indications for Surgery:   [name] is a 75 y.o. male who sustained a valgus impacted right femoral neck fracture after a mechanical fall.  In order to restore form and function, I have recommended operative fixation. Benefits and risks of operative and nonoperative management were discussed prior to surgery with patient and informed consent form was completed.  Specific risks including infection, need for additional surgery, nonunion, malunion, bleeding, poor healing, persistent pain, avascular necrosis of the femoral head and more severe complications associated with anesthesia were discussed.  He elected to proceed.    Procedure:   The patient was identified properly. Informed consent was obtained and the surgical site was marked. The patient was taken to the OR where general anesthesia was induced.  The patient was positioned supine on a fracture table.  The right leg was prepped and draped in the usual sterile fashion.  Timeout was performed before the beginning of the  case.  He received 2 g Ancef prior to making incision.  We started by making a lateral incision over the hip, in line with the starting point for the screws.  We incised sharply through the IT band to expose the lateral border of the femur.  Under fluoroscopic guidance, we placed the first guide wire along the inferior aspect of the femoral neck.  We ensured that the starting point was superior to the lesser trochanter so as to reduce the risk of a stress riser.  Orthogonal images confirmed the placement of the first guidewire to be inferior and posterior within the femoral neck.  We then placed the targeting device and placed 2 additional guidewires in the superior and anterior and then superior and posterior femoral neck. We used fluoroscopy and confirmed we were satisfied with the positioning of the guidewires.  Using the measuring device, we determined the length of screw needed.  We then proceeded to drill the lateral cortex.  All 3 screws with a washer were then inserted under fluoroscopic guidance.  Orthogonal imaging confirmed the location of the screws of sufficient length and the washers were flush with the lateral cortex.   We irrigated the wound copiously and then closed the incision in a multilayer fashion with absorbable suture.  Sterile dressing was placed.  Patient was awoken taken to PACU in stable condition.   Post-operative plan:  The patient will be returned to the floor.   Weightbearing status:  WBAT on the RLE Dressing to remain in place until POD#2/3; reinforce as needed DVT prophylaxis per primary team, no orthopedic contraindications.   Ok to resume POD#1.  Recommend  81 mg aspirin BID Pain control with PRN pain medication preferring oral medicines.   Follow up plan will be scheduled in approximately 10-14 days days for incision check and XR of the Right hip.

## 2021-02-10 NOTE — Anesthesia Procedure Notes (Signed)
Date/Time: 02/10/2021 1:16 PM Performed by: Orlie Dakin, CRNA Pre-anesthesia Checklist: Patient identified, Emergency Drugs available, Suction available and Patient being monitored Patient Re-evaluated:Patient Re-evaluated prior to induction Oxygen Delivery Method: Non-rebreather mask Induction Type: IV induction Placement Confirmation: positive ETCO2

## 2021-02-11 DIAGNOSIS — E785 Hyperlipidemia, unspecified: Secondary | ICD-10-CM

## 2021-02-11 DIAGNOSIS — W19XXXD Unspecified fall, subsequent encounter: Secondary | ICD-10-CM

## 2021-02-11 DIAGNOSIS — I639 Cerebral infarction, unspecified: Secondary | ICD-10-CM

## 2021-02-11 LAB — CBC
HCT: 39.9 % (ref 39.0–52.0)
Hemoglobin: 13.2 g/dL (ref 13.0–17.0)
MCH: 31.3 pg (ref 26.0–34.0)
MCHC: 33.1 g/dL (ref 30.0–36.0)
MCV: 94.5 fL (ref 80.0–100.0)
Platelets: 188 10*3/uL (ref 150–400)
RBC: 4.22 MIL/uL (ref 4.22–5.81)
RDW: 14.7 % (ref 11.5–15.5)
WBC: 10.8 10*3/uL — ABNORMAL HIGH (ref 4.0–10.5)
nRBC: 0 % (ref 0.0–0.2)

## 2021-02-11 LAB — MAGNESIUM: Magnesium: 2 mg/dL (ref 1.7–2.4)

## 2021-02-11 LAB — BASIC METABOLIC PANEL
Anion gap: 8 (ref 5–15)
BUN: 19 mg/dL (ref 8–23)
CO2: 28 mmol/L (ref 22–32)
Calcium: 8.8 mg/dL — ABNORMAL LOW (ref 8.9–10.3)
Chloride: 100 mmol/L (ref 98–111)
Creatinine, Ser: 0.69 mg/dL (ref 0.61–1.24)
GFR, Estimated: >60 mL/min (ref >60)
Glucose, Bld: 110 mg/dL — ABNORMAL HIGH (ref 70–99)
Potassium: 4.1 mmol/L (ref 3.5–5.1)
Sodium: 136 mmol/L (ref 135–145)

## 2021-02-11 LAB — GLUCOSE, CAPILLARY
Glucose-Capillary: 95 mg/dL (ref 70–99)
Glucose-Capillary: 89 mg/dL (ref 70–99)
Glucose-Capillary: 149 mg/dL — ABNORMAL HIGH (ref 70–99)
Glucose-Capillary: 113 mg/dL — ABNORMAL HIGH (ref 70–99)

## 2021-02-11 MED ORDER — ASPIRIN EC 81 MG PO TBEC
81.0000 mg | DELAYED_RELEASE_TABLET | Freq: Two times a day (BID) | ORAL | Status: DC
  Administered 2021-02-11 – 2021-02-14 (×7): 81 mg via ORAL
  Filled 2021-02-11 (×7): qty 1

## 2021-02-11 MED ORDER — PANTOPRAZOLE SODIUM 40 MG PO TBEC
40.0000 mg | DELAYED_RELEASE_TABLET | Freq: Every day | ORAL | Status: DC
  Administered 2021-02-11 – 2021-02-14 (×4): 40 mg via ORAL
  Filled 2021-02-11 (×4): qty 1

## 2021-02-11 NOTE — Plan of Care (Addendum)
  Problem: Acute Rehab PT Goals(only PT should resolve) Goal: Pt Will Go Supine/Side To Sit Outcome: Progressing Flowsheets (Taken 02/11/2021 1104) Pt will go Supine/Side to Sit: with modified independence Goal: Patient Will Transfer Sit To/From Stand Outcome: Progressing Flowsheets (Taken 02/11/2021 1104) Patient will transfer sit to/from stand: with supervision Note: Using RW Goal: Pt Will Transfer Bed To Chair/Chair To Bed Outcome: Progressing [place] (Taken 02/11/2021 1104) Pt will Transfer Bed to Chair/Chair to Bed: with modified independence Note: Using RW Goal: Pt Will Ambulate Outcome: Progressing Flowsheets (Taken 02/11/2021 1104) Pt will Ambulate:  25 feet  with rolling walker  with min guard assist    11:04 AM, 02/11/21 Sinclair Ship SPT  11:25 AM, 02/11/21 Lonell Grandchild, MPT Physical Therapist with [facility] [phone] office [phone] mobile phone

## 2021-02-11 NOTE — Plan of Care (Signed)
  Problem: Acute Rehab OT Goals (only OT should resolve) Goal: Pt. Will Perform Grooming Flowsheets (Taken 02/11/2021 1000) Pt Will Perform Grooming:  with min assist  with min guard assist  standing  with adaptive equipment Goal: Pt. Will Perform Lower Body Dressing Flowsheets (Taken 02/11/2021 1000) Pt Will Perform Lower Body Dressing:  with min assist  with min guard assist  sitting/lateral leans  sit to/from stand  with adaptive equipment Goal: Pt. Will Transfer To Toilet Flowsheets (Taken 02/11/2021 1000) Pt Will Transfer to Toilet:  with min assist  with min guard assist  stand pivot transfer Goal: Pt/Caregiver Will Perform Home Exercise Program Flowsheets (Taken 02/11/2021 1000) Pt/caregiver will Perform Home Exercise Program:  Increased strength  Both right and left upper extremity  With Supervision  Verdia Bolt OT, MOT

## 2021-02-11 NOTE — Progress Notes (Signed)
PROGRESS NOTE    [name]  YKD:[id number] DOB: 1945-08-23 DOA: 02/08/2021 PCP: Lucia Gaskins, MD   Brief Narrative:   [name] is a 75 y.o. male with medical history significant for COPD, type 2 diabetes, hypertension, dyslipidemia, hypothyroidism, and prior CVA who presented to the ED after a mechanical fall at home that resulted in severe right hip pain.  He had been admitted for acute right femoral neck fracture status post CRPP right femoral neck fracture on 02/10/2021.  Assessment & Plan:   Active Problems:   Hip fracture (HCC)   Acute right femoral neck fracture status post mechanical fall -Continue as needed analgesic management. -Appreciate orthopedic evaluation assistance and recommendation; status post surgical repair 02/10/2021 -Weightbearing as tolerated -Baby aspirin twice a day for DVT prophylaxis. -Physical therapy have seen patient and recommending skilled nursing facility for rehab and conditioning.   History of CVA/dyslipidemia -Continue statin -Using aspirin twice daily as part of DVT prophylaxis following hip surgery. -Will continue baby aspirin for secondary prevention after that.   History of COPD -Breathing treatments as needed -No wheezing, no shortness of breath, no oxygen requirement. -Continue as needed bronchodilators.   History of diabetes -Blood glucose currently well controlled -Plan to keep on carb modified diet after surgery -Continue SSI   Hypothyroidism -Continue home Synthroid     DVT prophylaxis:SCDs Code Status: DNR Family Communication: Sister at bedside 02/11/21 Disposition Plan:  Status is: Inpatient   Remains inpatient appropriate because:Ongoing diagnostic testing needed not appropriate for outpatient work up, IV treatments appropriate due to intensity of illness or inability to take PO, and Inpatient level of care appropriate due to severity of illness   Dispo: The patient is from: Home              Anticipated d/c  is to: SNF              Patient currently is not medically stable to d/c.              Difficult to place patient No     Consultants:  Orthopedics   Procedures:  See below   Antimicrobials:  None  Subjective: Patient reports intermittent pain on his hip; no issues overnight.  No chest pain, no nausea, no vomiting.  Objective: Vitals:   02/10/21 1607 02/10/21 2052 02/11/21 0519 02/11/21 1328  BP: (!) 164/84 (!) 154/80 (!) 141/88 128/75  Pulse: (!) 59 60 70 64  Resp: 18 18  16   Temp:  98.4 F (36.9 C) 97.8 F (36.6 C) 98.4 F (36.9 C)  TempSrc:  Oral Oral Oral  SpO2: 96% 96% 94% 96%  Weight:      Height:        Intake/Output Summary (Last 24 hours) at 02/11/2021 1904 Last data filed at 02/11/2021 1300 Gross per 24 hour  Intake 587.31 ml  Output 800 ml  Net -212.69 ml   Filed Weights   02/08/21 1104 02/08/21 1535  Weight: 74.8 kg 73.7 kg    Examination: General exam: Alert, awake and following commands appropriately. No fever, no CP, no SOB. Respiratory system: Clear to auscultation. Respiratory effort normal. Cardiovascular system:RRR. No rubs or gallops. Gastrointestinal system: Abdomen is nondistended, soft and nontender. No organomegaly or masses felt. Normal bowel sounds heard. Central nervous system: Alert and oriented. No focal neurological deficits. Extremities: No cyanosis or clubbing. Skin: No petechiae; lateral thigh dressing is intact, small amount of dried blood on the dressing appreciated.  No drainage. Psychiatry: Judgement  and insight appear normal. Mood & affect appropriate.     Data Reviewed: I have personally reviewed following labs and imaging studies  CBC: Recent Labs  Lab 02/08/21 1306 02/09/21 0533 02/10/21 0608 02/11/21 0509  WBC 14.2* 10.4 10.1 10.8*  NEUTROABS 12.3*  --   --   --   HGB 14.4 13.0 13.7 13.2  HCT 44.4 39.7 41.8 39.9  MCV 95.9 95.4 94.6 94.5  PLT 233 208 195 784   Basic Metabolic Panel: Recent Labs  Lab  02/08/21 1306 02/09/21 0533 02/10/21 0608 02/11/21 0509  NA 141 138 137 136  K 4.1 4.0 3.8 4.1  CL 104 103 103 100  CO2 28 27 25 28   GLUCOSE 94 92 109* 110*  BUN 21 22 17 19   CREATININE 0.60* 0.75 0.63 0.69  CALCIUM 9.6 9.0 8.9 8.8*  MG  --  2.0 2.1 2.0   GFR: Estimated Creatinine Clearance: 83.2 mL/min (by C-G formula based on SCr of 0.69 mg/dL).  Coagulation Profile: Recent Labs  Lab 02/08/21 1306 02/10/21 0608  INR 1.1 1.1    CBG: Recent Labs  Lab 02/10/21 1636 02/10/21 2056 02/11/21 0755 02/11/21 1149 02/11/21 1728  GLUCAP 126* 132* 89 149* 113*    Recent Results (from the past 240 hour(s))  Resp Panel by RT-PCR (Flu A&B, Covid) Nasopharyngeal Swab     Status: None   Collection Time: 02/08/21  1:53 PM   Specimen: Nasopharyngeal Swab; Nasopharyngeal(NP) swabs in vial transport medium  Result Value Ref Range Status   SARS Coronavirus 2 by RT PCR NEGATIVE NEGATIVE Final    Comment: (NOTE) SARS-CoV-2 target nucleic acids are NOT DETECTED.  The SARS-CoV-2 RNA is generally detectable in upper respiratory specimens during the acute phase of infection. The lowest concentration of SARS-CoV-2 viral copies this assay can detect is 138 copies/mL. A negative result does not preclude SARS-Cov-2 infection and should not be used as the sole basis for treatment or other patient management decisions. A negative result may occur with  improper specimen collection/handling, submission of specimen other than nasopharyngeal swab, presence of viral mutation(s) within the areas targeted by this assay, and inadequate number of viral copies(<138 copies/mL). A negative result must be combined with clinical observations, patient history, and epidemiological information. The expected result is Negative.  Fact Sheet for Patients:  EntrepreneurPulse.com.au  Fact Sheet for Healthcare Providers:  IncredibleEmployment.be  This test is no t yet  approved or cleared by the [place] FDA and  has been authorized for detection and/or diagnosis of SARS-CoV-2 by FDA under an Emergency Use Authorization (EUA). This EUA will remain  in effect (meaning this test can be used) for the duration of the COVID-19 declaration under Section 564(b)(1) of the Act, 21 U.S.C.section 360bbb-3(b)(1), unless the authorization is terminated  or revoked sooner.       Influenza A by PCR NEGATIVE NEGATIVE Final   Influenza B by PCR NEGATIVE NEGATIVE Final    Comment: (NOTE) The Xpert Xpress SARS-CoV-2/FLU/RSV plus assay is intended as an aid in the diagnosis of influenza from Nasopharyngeal swab specimens and should not be used as a sole basis for treatment. Nasal washings and aspirates are unacceptable for Xpert Xpress SARS-CoV-2/FLU/RSV testing.  Fact Sheet for Patients: EntrepreneurPulse.com.au  Fact Sheet for Healthcare Providers: IncredibleEmployment.be  This test is not yet approved or cleared by the [place] FDA and has been authorized for detection and/or diagnosis of SARS-CoV-2 by FDA under an Emergency Use Authorization (EUA). This EUA will remain in  effect (meaning this test can be used) for the duration of the COVID-19 declaration under Section 564(b)(1) of the Act, 21 U.S.C. section 360bbb-3(b)(1), unless the authorization is terminated or revoked.  Performed at [facility], [address] [place], [place] [address]   Surgical pcr screen     Status: None   Collection Time: 02/10/21  4:09 AM   Specimen: Nasal Mucosa; Nasal Swab  Result Value Ref Range Status   MRSA, PCR NEGATIVE NEGATIVE Final   Staphylococcus aureus NEGATIVE NEGATIVE Final    Comment: (NOTE) The Xpert SA Assay (FDA approved for NASAL specimens in patients 20 years of age and older), is one component of a comprehensive surveillance program. It is not intended to diagnose infection nor to guide or monitor  treatment. Performed at [facility], [address], [place], [place] [address]       Radiology Studies: DG HIP OPERATIVE UNILAT WITH PELVIS RIGHT  Result Date: 02/10/2021 CLINICAL DATA:  Operative images right hip EXAM: DG HIP (WITH OR WITHOUT PELVIS) 2-3V RIGHT; OPERATIVE RIGHT HIP WITH PELVIS COMPARISON:  02/08/2021 FINDINGS: Three Knowles pins are placed for treatment of a femoral neck fracture. Components appear well position with restoration of anatomic alignment. IMPRESSION: Good appearance following operative reduction and fixation of a right femoral neck fracture. Electronically Signed   By: Nelson Chimes M.D.   On: 02/10/2021 14:57   DG HIP UNILAT WITH PELVIS 2-3 VIEWS RIGHT  Result Date: 02/10/2021 CLINICAL DATA:  Operative images right hip EXAM: DG HIP (WITH OR WITHOUT PELVIS) 2-3V RIGHT; OPERATIVE RIGHT HIP WITH PELVIS COMPARISON:  02/08/2021 FINDINGS: Three Knowles pins are placed for treatment of a femoral neck fracture. Components appear well position with restoration of anatomic alignment. IMPRESSION: Good appearance following operative reduction and fixation of a right femoral neck fracture. Electronically Signed   By: Nelson Chimes M.D.   On: 02/10/2021 14:57     Scheduled Meds:  aspirin EC  81 mg Oral BID   Chlorhexidine Gluconate Cloth  6 each Topical Daily   cholecalciferol  50 mcg Oral Daily   insulin aspart  0-9 Units Subcutaneous TID WC   levothyroxine  150 mcg Oral Daily   multivitamin with minerals  1 tablet Oral Daily   pantoprazole  40 mg Oral Daily   rosuvastatin  20 mg Oral q1800   sodium chloride flush  3 mL Intravenous Q12H   thiamine  100 mg Oral Daily   cyanocobalamin  1,000 mcg Oral Daily   Continuous Infusions:  sodium chloride       LOS: 3 days    Time spent: 35 minutes    Barton Dubois, MD [facility]  If 7PM-7AM, please contact night-coverage [email or web address] 02/11/2021, 7:04 PM

## 2021-02-11 NOTE — NC FL2 (Signed)
[place] MEDICAID FL2 LEVEL OF CARE SCREENING TOOL     IDENTIFICATION  Patient Name: [name] Birthdate: 1945-12-08 Sex: male Admission Date (Current Location): 02/08/2021  [facility] and [place] Number:  [employer] and Address:  [place] [address], [place]      Provider Number: [id number]  Attending Physician Name and Address:  Barton Dubois, MD  Relative Name and Phone Number:       Current Level of Care: Hospital Recommended Level of Care: [place] Prior Approval Number:    Date Approved/Denied:   PASRR Number: [id number]  Discharge Plan: SNF    Current Diagnoses: Patient Active Problem List   Diagnosis Date Noted   Hip fracture ([place]) 02/08/2021   Stroke (cerebrum) ([place]) 11/10/2020   Weak 09/24/2020   Ambulatory dysfunction 09/24/2020   Other pulmonary embolism without acute cor pulmonale ([place]) 01/19/2020   Chronic obstructive pulmonary disease ([place]) 01/19/2020   Type 2 diabetes mellitus without complication ([place]) 30/16/0109   Goals of care, counseling/discussion 09/02/2018   Brain metastasis ([place]) 08/29/2018   Small cell carcinoma of lung, right ([place])    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   DNR (do not resuscitate) discussion 08/22/2018   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   Thoracic ascending aortic aneurysm ([place]) 08/21/2018   Lymphadenopathy 08/21/2018   Medial meniscus tear    Primary osteoarthritis of knee    Synovial plica of knee    Colitis 01/25/2011   Colon cancer screening 01/25/2011    Orientation RESPIRATION BLADDER Height & Weight     Self, Time, Situation, Place  Normal Continent Weight: 162 lb 7.7 oz (73.7 kg) Height:  5\' 11"  (180.3 cm)  BEHAVIORAL SYMPTOMS/MOOD NEUROLOGICAL BOWEL NUTRITION STATUS      Continent Diet (see dc summary)  AMBULATORY STATUS COMMUNICATION OF NEEDS Skin   Extensive Assist Verbally Surgical wounds                        Personal Care Assistance Level of Assistance  Bathing, Feeding, Dressing Bathing Assistance: Limited assistance Feeding assistance: Independent Dressing Assistance: Limited assistance     Functional Limitations Info  Sight, Hearing, Speech Sight Info: Adequate Hearing Info: Adequate Speech Info: Adequate    SPECIAL CARE FACTORS FREQUENCY  PT (By licensed PT), OT (By licensed OT)     PT Frequency: 5x week OT Frequency: 3x week            Contractures Contractures Info: Not present    Additional Factors Info  Code Status, Allergies Code Status Info: DNR Allergies Info: NKA           Current Medications (02/11/2021):  This is the current hospital active medication list Current Facility-Administered Medications  Medication Dose Route Frequency Provider Last Rate Last Admin   0.9 %  sodium chloride infusion  250 mL Intravenous PRN Mordecai Rasmussen, MD       acetaminophen (TYLENOL) tablet 650 mg  650 mg Oral Q6H PRN Mordecai Rasmussen, MD   650 mg at 02/11/21 3235   Or   acetaminophen (TYLENOL) suppository 650 mg  650 mg Rectal Q6H PRN Mordecai Rasmussen, MD       albuterol (PROVENTIL) (2.5 MG/3ML) 0.083% nebulizer solution 3 mL  3 mL Inhalation Q6H PRN Mordecai Rasmussen, MD       aspirin EC tablet 81 mg  81 mg Oral BID Barton Dubois, MD  81 mg at 02/11/21 1231   ceFAZolin (ANCEF) IVPB 2g/100 mL premix  2 g Intravenous Q8H Mordecai Rasmussen, MD 200 mL/hr at 02/11/21 0537 2 g at 02/11/21 0537   Chlorhexidine Gluconate Cloth 2 % PADS 6 each  6 each Topical Daily Heath Lark D, DO   6 each at 02/11/21 0160   cholecalciferol (VITAMIN D3) tablet 50 mcg  50 mcg Oral Daily Mordecai Rasmussen, MD   50 mcg at 02/11/21 0850   insulin aspart (novoLOG) injection 0-9 Units  0-9 Units Subcutaneous TID WC Mordecai Rasmussen, MD   1 Units at 02/11/21 1231   ketorolac (TORADOL) 15 MG/ML injection 15 mg  15 mg Intravenous Q6H PRN Mordecai Rasmussen, MD   15 mg at 02/09/21 1093   levothyroxine  (SYNTHROID) tablet 150 mcg  150 mcg Oral Daily Mordecai Rasmussen, MD   150 mcg at 02/11/21 0542   multivitamin with minerals tablet 1 tablet  1 tablet Oral Daily Mordecai Rasmussen, MD   1 tablet at 02/11/21 0850   ondansetron (ZOFRAN) tablet 4 mg  4 mg Oral Q6H PRN Mordecai Rasmussen, MD       Or   ondansetron [facility]) injection 4 mg  4 mg Intravenous Q6H PRN Mordecai Rasmussen, MD       oxyCODONE (Oxy IR/ROXICODONE) immediate release tablet 5 mg  5 mg Oral Q4H PRN Mordecai Rasmussen, MD   5 mg at 02/11/21 0055   pantoprazole (PROTONIX) EC tablet 40 mg  40 mg Oral Daily Barton Dubois, MD   40 mg at 02/11/21 1231   rosuvastatin (CRESTOR) tablet 20 mg  20 mg Oral q1800 Larena Glassman A, MD   20 mg at 02/11/21 0850   sodium chloride flush (NS) 0.9 % injection 3 mL  3 mL Intravenous Q12H Mordecai Rasmussen, MD   3 mL at 02/11/21 0854   sodium chloride flush (NS) 0.9 % injection 3 mL  3 mL Intravenous PRN Mordecai Rasmussen, MD       thiamine tablet 100 mg  100 mg Oral Daily Mordecai Rasmussen, MD   100 mg at 02/11/21 2355   vitamin B-12 (CYANOCOBALAMIN) tablet 1,000 mcg  1,000 mcg Oral Daily Mordecai Rasmussen, MD   1,000 mcg at 02/11/21 7322     Discharge Medications: Please see discharge summary for a list of discharge medications.  Relevant Imaging Results:  Relevant Lab Results:   Additional Information SSN: [id number]  Raheen Capili, Clydene Pugh, LCSW

## 2021-02-11 NOTE — Progress Notes (Addendum)
   ORTHOPAEDIC PROGRESS NOTE  Status post:  CRPP R femoral neck fracture  DOS: 02/10/21  SUBJECTIVE: Pain is controlled.  No issues over night.  He has been drinking but does not have an appetite yet.  No numbness or tingling.   OBJECTIVE: PE:  Alert and oriented, no acute distress  Lateral thigh dressing is intact; small amount of dry blood on dressing.  Actively flexing right hip this morning Small laceration over anterior tibia Toes are warm and well perfused Active motion intact in the TA/EHL Sensation intact over the dorsum of his foot.   Vitals:   02/10/21 2052 02/11/21 0519  BP: (!) 154/80 (!) 141/88  Pulse: 60 70  Resp: 18   Temp: 98.4 F (36.9 C) 97.8 F (36.6 C)  SpO2: 96% 94%     ASSESSMENT: [name] is a 75 y.o. male doing well postoperatively.  PLAN: Weightbearing: WBAT RLE Insicional and dressing care: Reinforce dressings as needed Orthopedic device(s): None VTE prophylaxis: At discretion of primary team; recommend 81 mg aspirin BID Pain control: PO medications, PRN.  Judicious use of narcotics Follow - up plan: 2 weeks postop   Contact information:     Dolores Mcgovern A. Amedeo Kinsman, MD [place] [address] [place],  [place]  [address] Phone: [phone] Fax: [phone]

## 2021-02-11 NOTE — Evaluation (Addendum)
Physical Therapy Evaluation Patient Details Name: [name] MRN: [id number] DOB: 1945/12/31 Today's Date: 02/11/2021   History of Present Illness  [name] is a 75 y.o. male with medical history significant for COPD, type 2 diabetes, hypertension, dyslipidemia, hypothyroidism, and prior CVA who presented to the ED after a mechanical fall at home that resulted in severe right hip pain.  There is no lightheadedness, dizziness, or loss of consciousness and he states that he had bumped his head as well.  No other symptoms have been reported.   Clinical Impression  Patient presents sitting up in bed with family present and agreeable to therapy. Patient demonstrated fair bed mobility with min/mod assist for transfer to a sitting position at the EOB. Patient required extra time and had slow labored movements. Patient required min/mod assist for sit to stand and was limited to a few steps at the bed side due to fatigue. During ambulation patient required facilitation of R knee extension facilitate WB during gait. Patient was transferred to the chair with mod assist - nursing notified. Patient will benefit from continued physical therapy in hospital and recommended venue below to increase strength, balance, endurance for safe ADLs and gait.     Follow Up Recommendations SNF    Equipment Recommendations  None recommended by PT    Recommendations for Other Services       Precautions / Restrictions Precautions Precautions: Fall Restrictions Weight Bearing Restrictions: Yes RLE Weight Bearing: Weight bearing as tolerated      Mobility  Bed Mobility Overal bed mobility: Needs Assistance Bed Mobility: Supine to Sit     Supine to sit: Min assist;Mod assist     General bed mobility comments: slow labored movement, increased time Patient Response: Cooperative  Transfers Overall transfer level: Needs assistance Equipment used: Rolling walker (2 wheeled) Transfers: Sit to/from  [facility] Sit to Stand: Mod assist;Min assist Stand pivot transfers: Mod assist;+2 physical assistance       General transfer comment: using RW, patient required assistance to faciliate locking R knee during WB  Ambulation/Gait Ambulation/Gait assistance: Mod assist Gait Distance (Feet): 4 Feet Assistive device: Rolling walker (2 wheeled) Gait Pattern/deviations: Decreased step length - left;Decreased stance time - right;Decreased stride length;Narrow base of support;Decreased weight shift to right;Decreased step length - right Gait velocity: decreased   General Gait Details: slow labored movements, increase time, poor ability to bear weight through the RLE  Stairs            Wheelchair Mobility    Modified Rankin (Stroke Patients Only)       Balance Overall balance assessment: Needs assistance Sitting-balance support: No upper extremity supported;Feet supported Sitting balance-Leahy Scale: Fair Sitting balance - Comments: EOB   Standing balance support: Bilateral upper extremity supported;During functional activity Standing balance-Leahy Scale: Poor Standing balance comment: using RW                             Pertinent Vitals/Pain Pain Assessment: 0-10 Pain Score: 5  Pain Location: R hip Pain Descriptors / Indicators: Guarding Pain Intervention(s): Limited activity within patient's tolerance;Monitored during session;Repositioned    Home Living Family/patient expects to be discharged to:: Private residence Living Arrangements: Other relatives Available Help at Discharge: Family;Available PRN/intermittently Type of Home: House Home Access: Stairs to enter Entrance Stairs-Rails: Can reach both;Right;Left Entrance Stairs-Number of Steps: 2 Home Layout: One level Home Equipment: Walker - 4 wheels;Cane - single point;Shower seat  Prior Function Level of Independence: Needs assistance   Gait / Transfers Assistance Needed: Pt  reports household ambulation without AD; use of cane in community  ADL's / Homemaking Assistance Needed: Pt reported being independent for ADL's until recently when his hip began to bother him.  Comments: uses rollator for mobilty > 50 yards, otherwise using cane; + driving, independent IADLs and medication mgmt     Hand Dominance   Dominant Hand: Right    Extremity/Trunk Assessment   Upper Extremity Assessment Upper Extremity Assessment: Defer to OT evaluation    Lower Extremity Assessment Lower Extremity Assessment: Generalized weakness;RLE deficits/detail RLE Deficits / Details: S/P ORIF R femur WBAT, noted decrease strength and patient unable to lock his R knee during ambulation    Cervical / Trunk Assessment Cervical / Trunk Assessment: Normal  Communication   Communication: No difficulties  Cognition Arousal/Alertness: Awake/alert Behavior During Therapy: WFL for tasks assessed/performed Overall Cognitive Status: Within Functional Limits for tasks assessed                                        General Comments      Exercises     Assessment/Plan    PT Assessment Patient needs continued PT services  PT Problem List Decreased strength;Decreased activity tolerance;Decreased balance;Decreased mobility;Decreased coordination;Decreased safety awareness       PT Treatment Interventions DME instruction;Gait training;Functional mobility training;Therapeutic activities;Therapeutic exercise;Balance training;Patient/family education    PT Goals (Current goals can be found in the Care Plan section)  Acute Rehab PT Goals Patient Stated Goal: Return home. Pt open to rehab. PT Goal Formulation: With patient/family Time For Goal Achievement: 02/18/21 Potential to Achieve Goals: Good    Frequency Min 4X/week   Barriers to discharge        Co-evaluation PT/OT/SLP Co-Evaluation/Treatment: Yes Reason for Co-Treatment: To address functional/ADL  transfers PT goals addressed during session: Mobility/safety with mobility OT goals addressed during session: ADL's and self-care;Strengthening/ROM       AM-PAC PT "6 Clicks" Mobility  Outcome Measure Help needed turning from your back to your side while in a flat bed without using bedrails?: A Little Help needed moving from lying on your back to sitting on the side of a flat bed without using bedrails?: A Little Help needed moving to and from a bed to a chair (including a wheelchair)?: A Lot Help needed standing up from a chair using your arms (e.g., wheelchair or bedside chair)?: A Lot Help needed to walk in hospital room?: A Lot Help needed climbing 3-5 steps with a railing? : Total 6 Click Score: 13    End of Session   Activity Tolerance: Patient tolerated treatment well;Patient limited by fatigue Patient left: in chair;with call bell/phone within reach;with chair alarm set;with family/visitor present Nurse Communication: Mobility status PT Visit Diagnosis: Unsteadiness on feet (R26.81);Difficulty in walking, not elsewhere classified (R26.2);Muscle weakness (generalized) (M62.81)    Time: 9622-2979 PT Time Calculation (min) (ACUTE ONLY): 28 min   Charges:   PT Evaluation $PT Eval Moderate Complexity: 1 Mod PT Treatments $Therapeutic Activity: 23-37 mins      11:27 AM, 02/11/21 Sinclair Ship SPT  11:27 AM, 02/11/21 Lonell Grandchild, MPT Physical Therapist with [facility] 336 [phone] office [phone] mobile phone

## 2021-02-11 NOTE — Plan of Care (Signed)
  Problem: Education: Goal: Knowledge of General Education information will improve Description Including pain rating scale, medication(s)/side effects and non-pharmacologic comfort measures Outcome: Progressing   

## 2021-02-11 NOTE — Care Management Important Message (Signed)
Important Message  Patient Details  Name: [name] MRN: [id number] Date of Birth: November 26, 1945   Medicare Important Message Given:  Yes     [name] 02/11/2021, 3:48 PM

## 2021-02-11 NOTE — TOC Progression Note (Signed)
Transition of Care [facility]) - Progression Note    Patient Details  Name: [name] MRN: [id number] Date of Birth: 03/30/1946  [facility]) CM/SW Contact  Ihor Gully, LCSW Phone Number: 02/11/2021, 1:45 PM  Clinical Narrative:    PT recommends SNF. Patient agreeable. Discussed facility options. Referrals sent to requested facilities.    Expected Discharge Plan: [place] Barriers to Discharge: Continued Medical Work up  Expected Discharge Plan and Services Expected Discharge Plan: [place] arrangements for the past 2 months: Single Family Home                                       Social Determinants of Health (SDOH) Interventions    Readmission Risk Interventions No flowsheet data found.

## 2021-02-11 NOTE — Evaluation (Signed)
Occupational Therapy Evaluation Patient Details Name: [name] MRN: [id number] DOB: 1945/10/29 Today's Date: 02/11/2021    History of Present Illness RAJVEER HANDLER is a 75 y.o. male with medical history significant for COPD, type 2 diabetes, hypertension, dyslipidemia, hypothyroidism, and prior CVA who presented to the ED after a mechanical fall at home that resulted in severe right hip pain.  There is no lightheadedness, dizziness, or loss of consciousness and he states that he had bumped his head as well.  No other symptoms have been reported.   Clinical Impression   Pt agreeable to OT/PT co-evaluation. Pt presents with general weakness with slow labored bed mobility and transfers. Min to Mod A needed for supine to sit bed mobility. Mod A +2 with this OT assisting with blocking R LE. Pt fatigued quickly during stand pivot transfer requiring more assist towards the end of transfer to chair. Pt left in chair with family present, call bell within reach, and chair alarm set. Pt will benefit from continued OT in the hospital and recommended venue below to increase strength, balance, and endurance for safe ADL's.      Follow Up Recommendations  SNF    Equipment Recommendations  none          Precautions / Restrictions Precautions Precautions: Fall Restrictions Weight Bearing Restrictions: Yes RLE Weight Bearing: Weight bearing as tolerated      Mobility Bed Mobility Overal bed mobility: Needs Assistance Bed Mobility: Supine to Sit     Supine to sit: Min assist;Mod assist     General bed mobility comments: slow labored movement    Transfers Overall transfer level: Needs assistance Equipment used: Rolling walker (2 wheeled) Transfers: Sit to/from [facility] Sit to Stand: Mod assist Stand pivot transfers: Mod assist;+2 physical assistance (PT provided primarly support while this OT assited with bocking or R knee. Simulated via EOB to chair transfer.)             Balance Overall balance assessment: Needs assistance Sitting-balance support: No upper extremity supported;Feet supported Sitting balance-Leahy Scale: Fair (fair to good) Sitting balance - Comments: EOB   Standing balance support: Bilateral upper extremity supported;During functional activity Standing balance-Leahy Scale: Poor Standing balance comment: using RW                           ADL either performed or assessed with clinical judgement   ADL Overall ADL's : Needs assistance/impaired       Grooming Details (indicate cue type and reason): Pt able to open shampoo container independently                 Toilet Transfer: Moderate assistance;+2 for physical assistance;RW Toilet Transfer Details (indicate cue type and reason): PT provided primarly support while this OT assited with bocking or R knee. Simulated via EOB to chair transfer.                 Vision Baseline Vision/History: Wears glasses Wears Glasses:  (Pt reported glasses are needed to see near objects.) Patient Visual Report: No change from baseline                  Pertinent Vitals/Pain Pain Assessment: 0-10 Pain Score: 5  Pain Location: R hip Pain Descriptors / Indicators: Guarding Pain Intervention(s): Limited activity within patient's tolerance;Monitored during session;Repositioned     Hand Dominance Right   Extremity/Trunk Assessment Upper Extremity Assessment Upper Extremity Assessment: Generalized weakness  Lower Extremity Assessment Lower Extremity Assessment: Defer to PT evaluation   Cervical / Trunk Assessment Cervical / Trunk Assessment: Normal   Communication Communication Communication: No difficulties   Cognition Arousal/Alertness: Awake/alert Behavior During Therapy: WFL for tasks assessed/performed Overall Cognitive Status: Within Functional Limits for tasks assessed                                                       Home Living Family/patient expects to be discharged to:: Private residence Living Arrangements: Other relatives Available Help at Discharge: Family;Available PRN/intermittently Type of Home: House Home Access: Stairs to enter [employer] of Steps: 2 (2 in front 6 in back) Entrance Stairs-Rails: Can reach both;Right;Left Home Layout: One level     Bathroom Shower/Tub: Teacher, early years/pre: Handicapped height Bathroom Accessibility: No   Home Equipment: Environmental consultant - 4 wheels;Cane - single point;Shower seat          Prior Functioning/Environment Level of Independence: Needs assistance  Gait / Transfers Assistance Needed: Pt reports household ambulation without AD; use of cane in community ADL's / Homemaking Assistance Needed: Pt reported being independent for ADL's until recently when his hip began to bother him.            OT Problem List: Decreased strength;Decreased activity tolerance;Impaired balance (sitting and/or standing)      OT Treatment/Interventions: Self-care/ADL training;Therapeutic exercise;Energy conservation;DME and/or AE instruction;Therapeutic activities;Patient/family education;Balance training    OT Goals(Current goals can be found in the care plan section) Acute Rehab OT Goals Patient Stated Goal: Return home. Pt open to rehab. OT Goal Formulation: With patient Time For Goal Achievement: 02/25/21 Potential to Achieve Goals: Good  OT Frequency: Min 2X/week               Co-evaluation PT/OT/SLP Co-Evaluation/Treatment: Yes Reason for Co-Treatment: To address functional/ADL transfers   OT goals addressed during session: ADL's and self-care;Strengthening/ROM      AM-PAC OT "6 Clicks" Daily Activity     Outcome Measure                 End of Session Equipment Utilized During Treatment: Rolling walker  Activity Tolerance: Patient tolerated treatment well Patient left: in chair;with call bell/phone within  reach;with chair alarm set  OT Visit Diagnosis: Unsteadiness on feet (R26.81);History of falling (Z91.81);Muscle weakness (generalized) (M62.81)                Time: 3710-6269 OT Time Calculation (min): 16 min Charges:  OT General Charges $OT Visit: 1 Visit OT Evaluation $OT Eval Low Complexity: 1 Low  Marquese Burkland OT, MOT   Larey Seat 02/11/2021, 9:57 AM

## 2021-02-12 LAB — GLUCOSE, CAPILLARY
Glucose-Capillary: 93 mg/dL (ref 70–99)
Glucose-Capillary: 97 mg/dL (ref 70–99)
Glucose-Capillary: 105 mg/dL — ABNORMAL HIGH (ref 70–99)
Glucose-Capillary: 111 mg/dL — ABNORMAL HIGH (ref 70–99)

## 2021-02-12 MED ORDER — TAMSULOSIN HCL 0.4 MG PO CAPS
0.4000 mg | ORAL_CAPSULE | Freq: Every day | ORAL | Status: DC
  Administered 2021-02-12 – 2021-02-13 (×2): 0.4 mg via ORAL
  Filled 2021-02-12 (×2): qty 1

## 2021-02-12 MED ORDER — POLYETHYLENE GLYCOL 3350 17 G PO PACK
17.0000 g | PACK | Freq: Every day | ORAL | Status: DC | PRN
  Administered 2021-02-14: 17 g via ORAL
  Filled 2021-02-12: qty 1

## 2021-02-12 MED ORDER — DOCUSATE SODIUM 100 MG PO CAPS
100.0000 mg | ORAL_CAPSULE | Freq: Two times a day (BID) | ORAL | Status: DC | PRN
  Administered 2021-02-12 – 2021-02-14 (×2): 100 mg via ORAL
  Filled 2021-02-12 (×2): qty 1

## 2021-02-12 NOTE — Plan of Care (Signed)
  Problem: Health Behavior/Discharge Planning: Goal: Ability to manage health-related needs will improve Outcome: Progressing   

## 2021-02-12 NOTE — Progress Notes (Signed)
PROGRESS NOTE    [name]  JSE:[id number] DOB: September 03, 1945 DOA: 02/08/2021 PCP: Lucia Gaskins, MD   Brief Narrative:   [name] is a 75 y.o. male with medical history significant for COPD, type 2 diabetes, hypertension, dyslipidemia, hypothyroidism, and prior CVA who presented to the ED after a mechanical fall at home that resulted in severe right hip pain.  He had been admitted for acute right femoral neck fracture status post CRPP right femoral neck fracture on 02/10/2021.  Assessment & Plan:   Active Problems:   Hip fracture (HCC)   Acute right femoral neck fracture status post mechanical fall -Continue as needed analgesic management. -Appreciate orthopedic evaluation assistance and recommendation; status post surgical repair 02/10/2021 -Weightbearing as tolerated -Baby aspirin twice a day for DVT prophylaxis. -Physical therapy have seen patient and recommending skilled nursing facility for rehab and conditioning.   History of CVA/dyslipidemia -Continue statin -Using aspirin twice daily as part of DVT prophylaxis following hip surgery. -Will continue baby aspirin for secondary prevention after that.   History of COPD -Breathing treatments as needed -No wheezing, no shortness of breath, no oxygen requirement. -Continue as needed bronchodilators.   History of diabetes -Blood glucose currently well controlled -Plan to keep on carb modified diet after surgery -Continue SSI   Hypothyroidism -Continue home Synthroid  Presumed BPH -Started low-dose Flomax     DVT prophylaxis:SCDs Code Status: DNR Family Communication: Sister at bedside 02/12/21 Disposition Plan:  Status is: Inpatient   Remains inpatient appropriate because:Ongoing diagnostic testing needed not appropriate for outpatient work up, IV treatments appropriate due to intensity of illness or inability to take PO, and Inpatient level of care appropriate due to severity of illness   Dispo: The patient is  from: Home              Anticipated d/c is to: SNF              Patient currently is not medically stable to d/c.              Difficult to place patient No     Consultants:  Orthopedics   Procedures:  See below   Antimicrobials:  None  Subjective:  Fever, no chest pain, no nausea, no vomiting.  Patient reports increased frequency and feeling that his bladder is not fully empty.  Objective: Vitals:   02/11/21 2007 02/12/21 0530 02/12/21 0840 02/12/21 1340  BP: 134/82 (!) 128/93 131/75 (!) 160/92  Pulse: 62 68 62 69  Resp: 18 18 17 18   Temp: 98.1 F (36.7 C) 97.6 F (36.4 C) 97.6 F (36.4 C) 97.7 F (36.5 C)  TempSrc: Oral Oral Oral Oral  SpO2: 96% 94% 97% 100%  Weight:      Height:        Intake/Output Summary (Last 24 hours) at 02/12/2021 1515 Last data filed at 02/12/2021 1200 Gross per 24 hour  Intake 480 ml  Output 750 ml  Net -270 ml   Filed Weights   02/08/21 1104 02/08/21 1535  Weight: 74.8 kg 73.7 kg    Examination: General exam: Febrile, no chest pain, no nausea, no vomiting, no acute distress. Respiratory system: Clear to auscultation. Respiratory effort normal.  Good saturation on room air. Cardiovascular system:RRR. No rubs or gallops no JVD. Gastrointestinal system: Abdomen is nondistended, soft and nontender. No organomegaly or masses felt. Normal bowel sounds heard. Central nervous system: No focal deficits appreciated on exam. Extremities: No cyanosis or clubbing.  Right lower extremity  with decreased range of motion. Skin: No petechiae. Psychiatry: Judgement and insight appear normal. Mood & affect appropriate.    Data Reviewed: I have personally reviewed following labs and imaging studies  CBC: Recent Labs  Lab 02/08/21 1306 02/09/21 0533 02/10/21 0608 02/11/21 0509  WBC 14.2* 10.4 10.1 10.8*  NEUTROABS 12.3*  --   --   --   HGB 14.4 13.0 13.7 13.2  HCT 44.4 39.7 41.8 39.9  MCV 95.9 95.4 94.6 94.5  PLT 233 208 195 062   Basic  Metabolic Panel: Recent Labs  Lab 02/08/21 1306 02/09/21 0533 02/10/21 0608 02/11/21 0509  NA 141 138 137 136  K 4.1 4.0 3.8 4.1  CL 104 103 103 100  CO2 28 27 25 28   GLUCOSE 94 92 109* 110*  BUN 21 22 17 19   CREATININE 0.60* 0.75 0.63 0.69  CALCIUM 9.6 9.0 8.9 8.8*  MG  --  2.0 2.1 2.0   GFR: Estimated Creatinine Clearance: 83.2 mL/min (by C-G formula based on SCr of 0.69 mg/dL).  Coagulation Profile: Recent Labs  Lab 02/08/21 1306 02/10/21 0608  INR 1.1 1.1    CBG: Recent Labs  Lab 02/11/21 0755 02/11/21 1149 02/11/21 1728 02/12/21 0750 02/12/21 1144  GLUCAP 89 149* 113* 93 97    Recent Results (from the past 240 hour(s))  Resp Panel by RT-PCR (Flu A&B, Covid) Nasopharyngeal Swab     Status: None   Collection Time: 02/08/21  1:53 PM   Specimen: Nasopharyngeal Swab; Nasopharyngeal(NP) swabs in vial transport medium  Result Value Ref Range Status   SARS Coronavirus 2 by RT PCR NEGATIVE NEGATIVE Final    Comment: (NOTE) SARS-CoV-2 target nucleic acids are NOT DETECTED.  The SARS-CoV-2 RNA is generally detectable in upper respiratory specimens during the acute phase of infection. The lowest concentration of SARS-CoV-2 viral copies this assay can detect is 138 copies/mL. A negative result does not preclude SARS-Cov-2 infection and should not be used as the sole basis for treatment or other patient management decisions. A negative result may occur with  improper specimen collection/handling, submission of specimen other than nasopharyngeal swab, presence of viral mutation(s) within the areas targeted by this assay, and inadequate number of viral copies(<138 copies/mL). A negative result must be combined with clinical observations, patient history, and epidemiological information. The expected result is Negative.  Fact Sheet for Patients:  EntrepreneurPulse.com.au  Fact Sheet for Healthcare Providers:   IncredibleEmployment.be  This test is no t yet approved or cleared by the [place] FDA and  has been authorized for detection and/or diagnosis of SARS-CoV-2 by FDA under an Emergency Use Authorization (EUA). This EUA will remain  in effect (meaning this test can be used) for the duration of the COVID-19 declaration under Section 564(b)(1) of the Act, 21 U.S.C.section 360bbb-3(b)(1), unless the authorization is terminated  or revoked sooner.       Influenza A by PCR NEGATIVE NEGATIVE Final   Influenza B by PCR NEGATIVE NEGATIVE Final    Comment: (NOTE) The Xpert Xpress SARS-CoV-2/FLU/RSV plus assay is intended as an aid in the diagnosis of influenza from Nasopharyngeal swab specimens and should not be used as a sole basis for treatment. Nasal washings and aspirates are unacceptable for Xpert Xpress SARS-CoV-2/FLU/RSV testing.  Fact Sheet for Patients: EntrepreneurPulse.com.au  Fact Sheet for Healthcare Providers: IncredibleEmployment.be  This test is not yet approved or cleared by the [place] FDA and has been authorized for detection and/or diagnosis of SARS-CoV-2 by FDA under an  Emergency Use Authorization (EUA). This EUA will remain in effect (meaning this test can be used) for the duration of the COVID-19 declaration under Section 564(b)(1) of the Act, 21 U.S.C. section 360bbb-3(b)(1), unless the authorization is terminated or revoked.  Performed at [facility], [address], [place], [place] [address]   Surgical pcr screen     Status: None   Collection Time: 02/10/21  4:09 AM   Specimen: Nasal Mucosa; Nasal Swab  Result Value Ref Range Status   MRSA, PCR NEGATIVE NEGATIVE Final   Staphylococcus aureus NEGATIVE NEGATIVE Final    Comment: (NOTE) The Xpert SA Assay (FDA approved for NASAL specimens in patients 10 years of age and older), is one component of a comprehensive surveillance program. It is not  intended to diagnose infection nor to guide or monitor treatment. Performed at [facility], [address] [place], [place] [address]       Radiology Studies: No results found.   Scheduled Meds:  aspirin EC  81 mg Oral BID   Chlorhexidine Gluconate Cloth  6 each Topical Daily   cholecalciferol  50 mcg Oral Daily   insulin aspart  0-9 Units Subcutaneous TID WC   levothyroxine  150 mcg Oral Daily   multivitamin with minerals  1 tablet Oral Daily   pantoprazole  40 mg Oral Daily   rosuvastatin  20 mg Oral q1800   sodium chloride flush  3 mL Intravenous Q12H   thiamine  100 mg Oral Daily   cyanocobalamin  1,000 mcg Oral Daily   Continuous Infusions:  sodium chloride       LOS: 4 days    Time spent: 30 minutes   Barton Dubois, MD [facility]  If 7PM-7AM, please contact night-coverage [email or web address] 02/12/2021, 3:15 PM

## 2021-02-12 NOTE — TOC Progression Note (Signed)
Transition of Care [facility]) - Progression Note    Patient Details  Name: [name] MRN: [id number] Date of Birth: [birth year]  Transition [facility]) CM/SW [place], LCSW Phone Number: 02/12/2021, 3:56 PM  Clinical Narrative:    CSW contacted patient's sister who initially reported that they would like [facility]. CSW contacted Jackelyn Poling with [facility] to inquire about abilities to take patient on 02/12/21. CSW was then notified by patient's sister that patient preferred [facility].CSW contacted Eb[facility] Hail with [facility]. Ebony Hail agreeable to take patient on 02/14/21. Patient is fully vaccinated. TOC to follow.   Expected Discharge Plan: [place] Barriers to Discharge: Continued Medical Work up  Expected Discharge Plan and Services Expected Discharge Plan: [place] arrangements for the past 2 months: Single Family Home                                       Social Determinants of Health (SDOH) Interventions    Readmission Risk Interventions No flowsheet data found.

## 2021-02-12 NOTE — Progress Notes (Signed)
   ORTHOPAEDIC PROGRESS NOTE  Status post:  CRPP R femoral neck fracture  DOS: 02/10/21  SUBJECTIVE: Pain is controlled with Oxycodone.  He worked with PT yesterday.  Waiting for placement.  He is urinating frequently.  No issues over night.   OBJECTIVE: PE:  Alert and oriented, no acute distress  Lateral thigh dressing is intact; small amount of dry blood on dressing.  Small laceration over anterior tibia Toes are warm and well perfused Active motion intact in the TA/EHL Sensation intact over the dorsum of his foot.   Vitals:   02/12/21 0530 02/12/21 0840  BP: (!) 128/93 131/75  Pulse: 68 62  Resp: 18 17  Temp: 97.6 F (36.4 C) 97.6 F (36.4 C)  SpO2: 94% 97%     ASSESSMENT: FRAZER RAINVILLE is a 75 y.o. male doing well, POD#2.  Waiting for placement  PLAN: Weightbearing: WBAT RLE Insicional and dressing care: Reinforce dressings as needed Orthopedic device(s): None VTE prophylaxis: At discretion of primary team; recommend 81 mg aspirin BID Pain control: PO medications, PRN.  Judicious use of narcotics Follow - up plan: 2 weeks postop; office will contact patient to schedule follow up appointment.    Contact information:     Viktor Philipp A. Amedeo Kinsman, MD [place] [address] [place],  [place]  [address] Phone: [phone] Fax: [phone]

## 2021-02-13 ENCOUNTER — Inpatient Hospital Stay (HOSPITAL_COMMUNITY): Payer: Medicare Other

## 2021-02-13 LAB — BASIC METABOLIC PANEL
Anion gap: 7 (ref 5–15)
BUN: 14 mg/dL (ref 8–23)
CO2: 30 mmol/L (ref 22–32)
Calcium: 8.5 mg/dL — ABNORMAL LOW (ref 8.9–10.3)
Chloride: 101 mmol/L (ref 98–111)
Creatinine, Ser: 0.67 mg/dL (ref 0.61–1.24)
GFR, Estimated: >60 mL/min (ref >60)
Glucose, Bld: 110 mg/dL — ABNORMAL HIGH (ref 70–99)
Potassium: 3.7 mmol/L (ref 3.5–5.1)
Sodium: 138 mmol/L (ref 135–145)

## 2021-02-13 LAB — CBC
HCT: 39.9 % (ref 39.0–52.0)
Hemoglobin: 13.1 g/dL (ref 13.0–17.0)
MCH: 31 pg (ref 26.0–34.0)
MCHC: 32.8 g/dL (ref 30.0–36.0)
MCV: 94.3 fL (ref 80.0–100.0)
Platelets: 228 10*3/uL (ref 150–400)
RBC: 4.23 MIL/uL (ref 4.22–5.81)
RDW: 14.6 % (ref 11.5–15.5)
WBC: 7.9 10*3/uL (ref 4.0–10.5)
nRBC: 0 % (ref 0.0–0.2)

## 2021-02-13 LAB — GLUCOSE, CAPILLARY
Glucose-Capillary: 122 mg/dL — ABNORMAL HIGH (ref 70–99)
Glucose-Capillary: 97 mg/dL (ref 70–99)
Glucose-Capillary: 113 mg/dL — ABNORMAL HIGH (ref 70–99)

## 2021-02-13 NOTE — Progress Notes (Signed)
PROGRESS NOTE    [name]  KWI:[id number] DOB: 1946/07/12 DOA: 02/08/2021 PCP: Lucia Gaskins, MD   Brief Narrative:   [name] is a 75 y.o. male with medical history significant for COPD, type 2 diabetes, hypertension, dyslipidemia, hypothyroidism, and prior CVA who presented to the ED after a mechanical fall at home that resulted in severe right hip pain.  He had been admitted for acute right femoral neck fracture status post CRPP right femoral neck fracture on 02/10/2021.  Assessment & Plan:   Active Problems:   Hip fracture (HCC)   Acute right femoral neck fracture status post mechanical fall -Continue as needed analgesic management. -Appreciate orthopedic evaluation assistance and recommendation; status post surgical repair 02/10/2021 -Weightbearing as tolerated -Baby aspirin twice a day for DVT prophylaxis. -Physical therapy have seen patient and recommending skilled nursing facility for rehab and conditioning. -Repeat anterior posterior pelvis images after unwitnessed fall overnight demonstrated stable ORIF changes on his right hip and no other acute abnormalities.   History of CVA/dyslipidemia -Continue statin -Using aspirin twice daily as part of DVT prophylaxis following hip surgery. -Will continue baby aspirin for secondary prevention after that.   History of COPD -Breathing treatments as needed -No wheezing, no shortness of breath, no oxygen requirement. -Continue as needed bronchodilators.   History of diabetes -Blood glucose currently well controlled -Plan to keep on carb modified diet after surgery -Continue SSI   Hypothyroidism -Continue home Synthroid  Presumed BPH -Started low-dose Flomax     DVT prophylaxis:SCDs Code Status: DNR Family Communication: Sister at bedside 02/13/21 Disposition Plan:  Status is: Inpatient   Remains inpatient appropriate because: Waiting for bed available at a skilled nursing facility to be discharged for further  care and rehabilitation.  Unsafe for patient to be discharged home at this moment due to poor balance Of ability to perform ADLS on his own.   Dispo: The patient is from: Home              Anticipated d/c is to: SNF              Patient is currently medically stable for discharge to skilled nursing facility when bed available.              Difficult to place patient No     Consultants:  Orthopedics   Procedures:  See below   Antimicrobials:  None  Subjective:  Overnight with unwitnessed fall; no chest pain, no nausea, no vomiting, no fever.  Objective: Vitals:   02/12/21 1340 02/12/21 2051 02/13/21 0525 02/13/21 0708  BP: (!) 160/92 (!) 167/131 131/79 (!) 123/95  Pulse: 69 79 78 100  Resp: 18 18 18 18   Temp: 97.7 F (36.5 C) (!) 97.5 F (36.4 C) (!) 97.5 F (36.4 C) 97.7 F (36.5 C)  TempSrc: Oral Oral Oral Oral  SpO2: 100% 96% 94% 94%  Weight:      Height:        Intake/Output Summary (Last 24 hours) at 02/13/2021 1326 Last data filed at 02/13/2021 0900 Gross per 24 hour  Intake 480 ml  Output 700 ml  Net -220 ml   Filed Weights   02/08/21 1104 02/08/21 1535  Weight: 74.8 kg 73.7 kg    Examination: General exam: No fever, no chest pain, no nausea, no vomiting, no shortness of breath.  Overnight experience fall in his room unwitnessed. Respiratory system: Clear to auscultation. Respiratory effort normal.  No using accessory muscle.  Good saturation on room  air Cardiovascular system:RRR. No rubs or gallops; no JVD. Gastrointestinal system: Abdomen is nondistended, soft and nontender. No organomegaly or masses felt. Normal bowel sounds heard. Central nervous system: Alert and oriented. No focal neurological deficits. Extremities: No cyanosis or clubbing. Skin: No petechiae. Psychiatry: Judgement and insight appear normal. Mood & affect appropriate.    Data Reviewed: I have personally reviewed following labs and imaging studies  CBC: Recent Labs  Lab  02/08/21 1306 02/09/21 0533 02/10/21 0608 02/11/21 0509 02/13/21 0354  WBC 14.2* 10.4 10.1 10.8* 7.9  NEUTROABS 12.3*  --   --   --   --   HGB 14.4 13.0 13.7 13.2 13.1  HCT 44.4 39.7 41.8 39.9 39.9  MCV 95.9 95.4 94.6 94.5 94.3  PLT 233 208 195 188 321   Basic Metabolic Panel: Recent Labs  Lab 02/08/21 1306 02/09/21 0533 02/10/21 0608 02/11/21 0509 02/13/21 0354  NA 141 138 137 136 138  K 4.1 4.0 3.8 4.1 3.7  CL 104 103 103 100 101  CO2 28 27 25 28 30   GLUCOSE 94 92 109* 110* 110*  BUN 21 22 17 19 14   CREATININE 0.60* 0.75 0.63 0.69 0.67  CALCIUM 9.6 9.0 8.9 8.8* 8.5*  MG  --  2.0 2.1 2.0  --    GFR: Estimated Creatinine Clearance: 83.2 mL/min (by C-G formula based on SCr of 0.67 mg/dL).  Coagulation Profile: Recent Labs  Lab 02/08/21 1306 02/10/21 0608  INR 1.1 1.1    CBG: Recent Labs  Lab 02/12/21 1144 02/12/21 1709 02/12/21 2047 02/13/21 0722 02/13/21 1125  GLUCAP 97 105* 111* 122* 97    Recent Results (from the past 240 hour(s))  Resp Panel by RT-PCR (Flu A&B, Covid) Nasopharyngeal Swab     Status: None   Collection Time: 02/08/21  1:53 PM   Specimen: Nasopharyngeal Swab; Nasopharyngeal(NP) swabs in vial transport medium  Result Value Ref Range Status   SARS Coronavirus 2 by RT PCR NEGATIVE NEGATIVE Final    Comment: (NOTE) SARS-CoV-2 target nucleic acids are NOT DETECTED.  The SARS-CoV-2 RNA is generally detectable in upper respiratory specimens during the acute phase of infection. The lowest concentration of SARS-CoV-2 viral copies this assay can detect is 138 copies/mL. A negative result does not preclude SARS-Cov-2 infection and should not be used as the sole basis for treatment or other patient management decisions. A negative result may occur with  improper specimen collection/handling, submission of specimen other than nasopharyngeal swab, presence of viral mutation(s) within the areas targeted by this assay, and inadequate number of  viral copies(<138 copies/mL). A negative result must be combined with clinical observations, patient history, and epidemiological information. The expected result is Negative.  Fact Sheet for Patients:  EntrepreneurPulse.com.au  Fact Sheet for Healthcare Providers:  IncredibleEmployment.be  This test is no t yet approved or cleared by the [place] FDA and  has been authorized for detection and/or diagnosis of SARS-CoV-2 by FDA under an Emergency Use Authorization (EUA). This EUA will remain  in effect (meaning this test can be used) for the duration of the COVID-19 declaration under Section 564(b)(1) of the Act, 21 U.S.C.section 360bbb-3(b)(1), unless the authorization is terminated  or revoked sooner.       Influenza A by PCR NEGATIVE NEGATIVE Final   Influenza B by PCR NEGATIVE NEGATIVE Final    Comment: (NOTE) The Xpert Xpress SARS-CoV-2/FLU/RSV plus assay is intended as an aid in the diagnosis of influenza from Nasopharyngeal swab specimens and should not be  used as a sole basis for treatment. Nasal washings and aspirates are unacceptable for Xpert Xpress SARS-CoV-2/FLU/RSV testing.  Fact Sheet for Patients: EntrepreneurPulse.com.au  Fact Sheet for Healthcare Providers: IncredibleEmployment.be  This test is not yet approved or cleared by the [place] FDA and has been authorized for detection and/or diagnosis of SARS-CoV-2 by FDA under an Emergency Use Authorization (EUA). This EUA will remain in effect (meaning this test can be used) for the duration of the COVID-19 declaration under Section 564(b)(1) of the Act, 21 U.S.C. section 360bbb-3(b)(1), unless the authorization is terminated or revoked.  Performed at [facility], [address], [place], [place] [address]   Surgical pcr screen     Status: None   Collection Time: 02/10/21  4:09 AM   Specimen: Nasal Mucosa; Nasal Swab  Result  Value Ref Range Status   MRSA, PCR NEGATIVE NEGATIVE Final   Staphylococcus aureus NEGATIVE NEGATIVE Final    Comment: (NOTE) The Xpert SA Assay (FDA approved for NASAL specimens in patients 26 years of age and older), is one component of a comprehensive surveillance program. It is not intended to diagnose infection nor to guide or monitor treatment. Performed at [facility], [address], [place], [place] [address]       Radiology Studies: DG HIPS BILAT WITH PELVIS 3-4 VIEWS  Result Date: 02/13/2021 CLINICAL DATA:  Bilateral hip pain status post fall. EXAM: DG HIP (WITH OR WITHOUT PELVIS) 3-4V BILAT COMPARISON:  02/10/2021 FINDINGS: Atherosclerotic changes seen throughout visualized arterial segments. Postsurgical changes of right femoral fixation again seen. The hardware is intact without periprosthetic fracture or lucency. Alignment of the right femoral fracture is unchanged. IMPRESSION: Unchanged appearance of recent right femoral ORIF. No new acute abnormality of the pelvis or hips. Electronically Signed   By: Miachel Roux M.D.   On: 02/13/2021 13:11     Scheduled Meds:  aspirin EC  81 mg Oral BID   Chlorhexidine Gluconate Cloth  6 each Topical Daily   cholecalciferol  50 mcg Oral Daily   insulin aspart  0-9 Units Subcutaneous TID WC   levothyroxine  150 mcg Oral Daily   multivitamin with minerals  1 tablet Oral Daily   pantoprazole  40 mg Oral Daily   rosuvastatin  20 mg Oral q1800   sodium chloride flush  3 mL Intravenous Q12H   tamsulosin  0.4 mg Oral QPC supper   thiamine  100 mg Oral Daily   cyanocobalamin  1,000 mcg Oral Daily   Continuous Infusions:  sodium chloride       LOS: 5 days    Time spent: 30 minutes   Barton Dubois, MD [facility]  If 7PM-7AM, please contact night-coverage [email or web address] 02/13/2021, 1:26 PM

## 2021-02-13 NOTE — TOC Progression Note (Signed)
Transition of Care [facility]) - Progression Note    Patient Details  Name: [name] MRN: [id number] Date of Birth: [birth year]  Transition of Care [facility]) CM/SW Contact  Natasha Bence, LCSW Phone Number: 02/13/2021, 4:19 PM  Clinical Narrative:    Patient no requesting [facility] again. Debbie reported that bed is not available until Monday. TOC to follow.   Expected Discharge Plan: [place] Barriers to Discharge: Continued Medical Work up  Expected Discharge Plan and Services Expected Discharge Plan: [facility] arrangements for the past 2 months: Single Family Home                                       Social Determinants of Health (SDOH) Interventions    Readmission Risk Interventions No flowsheet data found.

## 2021-02-13 NOTE — Progress Notes (Signed)
Staff rounding on pt, staff pt on floor laying on left side. Pt's vitals are stable. Pt states he was just looking under the bed for something. Pt states he did not hit his head. Pt was ambulating unassisted. Pt placed in bed. Bed alarm is on, fall mats are in place and will continue to monitor pt.

## 2021-02-14 ENCOUNTER — Encounter (HOSPITAL_COMMUNITY): Payer: Self-pay | Admitting: Orthopedic Surgery

## 2021-02-14 DIAGNOSIS — S72001A Fracture of unspecified part of neck of right femur, initial encounter for closed fracture: Secondary | ICD-10-CM

## 2021-02-14 DIAGNOSIS — K219 Gastro-esophageal reflux disease without esophagitis: Secondary | ICD-10-CM

## 2021-02-14 DIAGNOSIS — R5381 Other malaise: Secondary | ICD-10-CM

## 2021-02-14 DIAGNOSIS — E785 Hyperlipidemia, unspecified: Secondary | ICD-10-CM

## 2021-02-14 DIAGNOSIS — I10 Essential (primary) hypertension: Secondary | ICD-10-CM

## 2021-02-14 DIAGNOSIS — R350 Frequency of micturition: Secondary | ICD-10-CM

## 2021-02-14 DIAGNOSIS — E039 Hypothyroidism, unspecified: Secondary | ICD-10-CM

## 2021-02-14 DIAGNOSIS — N401 Enlarged prostate with lower urinary tract symptoms: Secondary | ICD-10-CM

## 2021-02-14 LAB — GLUCOSE, CAPILLARY
Glucose-Capillary: 128 mg/dL — ABNORMAL HIGH (ref 70–99)
Glucose-Capillary: 125 mg/dL — ABNORMAL HIGH (ref 70–99)

## 2021-02-14 MED ORDER — OXYCODONE HCL 5 MG PO TABS: 5.0000 mg | ORAL_TABLET | Freq: Four times a day (QID) | ORAL | 0 refills | Status: DC | PRN

## 2021-02-14 MED ORDER — ASPIRIN 81 MG PO TBEC
81.0000 mg | DELAYED_RELEASE_TABLET | Freq: Two times a day (BID) | ORAL | Status: DC
Start: 2021-02-14 — End: 2021-06-24

## 2021-02-14 MED ORDER — DOCUSATE SODIUM 100 MG PO CAPS: 100.0000 mg | ORAL_CAPSULE | Freq: Two times a day (BID) | ORAL | 0 refills | Status: DC | PRN

## 2021-02-14 MED ORDER — PANTOPRAZOLE SODIUM 40 MG PO TBEC: 40.0000 mg | DELAYED_RELEASE_TABLET | Freq: Every day | ORAL | Status: AC

## 2021-02-14 MED ORDER — POLYETHYLENE GLYCOL 3350 17 G PO PACK: 17.0000 g | PACK | Freq: Every day | ORAL | 0 refills | Status: DC | PRN

## 2021-02-14 MED ORDER — TAMSULOSIN HCL 0.4 MG PO CAPS: 0.4000 mg | ORAL_CAPSULE | Freq: Every day | ORAL | Status: DC

## 2021-02-14 MED ORDER — ACETAMINOPHEN 325 MG PO TABS: 650.0000 mg | ORAL_TABLET | Freq: Four times a day (QID) | ORAL | 0 refills | Status: AC | PRN

## 2021-02-14 NOTE — Discharge Summary (Signed)
Physician Discharge Summary  [name] WGY:[id number] DOB: Nov 30, 1945 DOA: 02/08/2021  PCP: Lucia Gaskins, MD  Admit date: 02/08/2021 Discharge date: 02/14/2021  Time spent: 35 minutes  Recommendations for Outpatient Follow-up:  Repeat basic metabolic panel to follow transplant hypertension Repeat CBC to follow hemoglobin trend weightbearing as tolerated. Aspirin twice a day for DVT prophylax Be judicious with narcotic management for pain control  Follow-up with orthopedic service 2 weeks.  Discharge Diagnoses:  Active Problems:   Hip fracture (HCC)   Closed fracture of neck of right femur (HCC)   Primary hypertension   Physical deconditioning   Gastroesophageal reflux disease   Benign prostatic hyperplasia with urinary frequency   Hypothyroidism   Hyperlipidemia Type 2 diabetes (diet-controlled currently).  Discharge Condition: Stable and improved.  Discharge to skilled nursing facility for further care and rehabilitation.  Patient will follow up with orthopedic service in 2 weeks.  CODE STATUS: DNR  Diet recommendation: Heart healthy and modified carbohydrate diet.  Filed Weights   02/08/21 1104 02/08/21 1535  Weight: 74.8 kg 73.7 kg    Brief history of present illness:  [name] is a 75 y.o. male with medical history significant for COPD, type 2 diabetes, hypertension, dyslipidemia, hypothyroidism, and prior CVA who presented to the ED after a mechanical fall at home that resulted in severe right hip pain.  He had been admitted for acute right femoral neck fracture status post CRPP right femoral neck fracture on 02/10/2021.  Hospital Course:  Acute right femoral neck fracture status post mechanical fall -Continue as needed analgesic management. -Appreciate orthopedic evaluation assistance and recommendation; status post surgical repair 02/10/2021 -Weightbearing as tolerated -Baby aspirin twice a day for DVT prophylaxis. -Physical therapy have seen patient and  recommending skilled nursing facility for rehab and conditioning. -Repeat anterior posterior pelvis images after unwitnessed fall demonstrated stable ORIF changes on his right hip and no other acute abnormalities. -Patient is medically ready for discharge to skilled nursing facility [facility]) for further rehabilitation and care.   History of CVA/dyslipidemia -Continue statins -Using aspirin twice daily as part of DVT prophylaxis following hip surgery. -Will continue daily baby aspirin for secondary prevention after that.   History of COPD -No wheezing, no shortness of breath, no oxygen requirement. -Continue as needed bronchodilators.   History of diabetes -Blood glucose currently well controlled -Continue modified carbohydrate diet.   Hypothyroidism -Continue home dose of Synthroid -Outpatient pulm available thyroid panel.   Presumed BPH -Started low-dose Flomax -Will recommend follow-up with urology service as an outpatient.  Procedures: See below for x-reports  CRPP Right Femoral neck fracture. 02/10/21  Consultations: Orthopedic service   Discharge Exam: Vitals:   02/13/21 2101 02/14/21 0534  BP: 124/79 112/81  Pulse: 70 84  Resp: 18 19  Temp: (!) 97.4 F (36.3 C) 97.9 F (36.6 C)  SpO2: 94% 94%   General exam: No fever, no chest pain, no nausea, no vomiting, no shortness of breath.  pelvic x-rays demonstrating stable fracture repair. No acute abnormalities.  Respiratory system: Clear to auscultation. Respiratory effort normal.  No using accessory muscle.  Good saturation on room air Cardiovascular system:RRR. No rubs or gallops; no JVD. Gastrointestinal system: Abdomen is nondistended, soft and nontender. No organomegaly or masses felt. Normal bowel sounds heard. Central nervous system: Alert and oriented. No focal neurological deficits. Extremities: No cyanosis or clubbing. Skin: No petechiae. Psychiatry: Judgement and insight appear normal. Mood & affect  appropriate.   Discharge Instructions   Discharge Instructions  Diet - low sodium heart healthy   Complete by: As directed    Discharge instructions   Complete by: As directed    Take medications as prescribed Physical therapy and rehabilitation as per the skilled nursing facility protocol Follow-up with orthopedic service in 2 weeks (office will contact him with appointment details) Weightbearing as tolerated on his RLE. Aspirin twice a day for DVT prophylaxis for the next 14 to 21 days; once a day after that. Repeat CBC in 5 days to follow hemoglobin trend and stability. Repeat basic metabolic panel in 5 days to follow twice and renal function.   Discharge wound care:   Complete by: As directed    Keep dressing clean and dry, reinforced as needed and keep in place until follow up with orthopedic service.   Increase activity slowly   Complete by: As directed       Allergies as of 02/14/2021   No Known Allergies      Medication List     STOP taking these medications    clopidogrel 75 MG tablet Commonly known as: PLAVIX       TAKE these medications    acetaminophen 325 MG tablet Commonly known as: TYLENOL Take 2 tablets (650 mg total) by mouth every 6 (six) hours as needed for mild pain (or Fever >/= 101).   albuterol 108 (90 Base) MCG/ACT inhaler Commonly known as: VENTOLIN HFA Inhale 2 puffs into the lungs every 6 (six) hours as needed for wheezing or shortness of breath.   aspirin 81 MG EC tablet Take 1 tablet (81 mg total) by mouth 2 (two) times daily. Swallow whole.   Cholecalciferol 50 MCG (2000 UT) Tbdp Take 1 tablet by mouth daily.   cyanocobalamin 1000 MCG tablet Take 1 tablet (1,000 mcg total) by mouth daily.   docusate sodium 100 MG capsule Commonly known as: COLACE Take 1 capsule (100 mg total) by mouth 2 (two) times daily as needed for mild constipation.   levothyroxine 150 MCG tablet Commonly known as: SYNTHROID Take 150 mcg by mouth  daily.   multivitamin with minerals Tabs tablet Take 1 tablet by mouth daily.   oxyCODONE 5 MG immediate release tablet Commonly known as: Oxy IR/ROXICODONE Take 1 tablet (5 mg total) by mouth every 6 (six) hours as needed for severe pain.   pantoprazole 40 MG tablet Commonly known as: PROTONIX Take 1 tablet (40 mg total) by mouth daily. Start taking on: February 15, 2021   polyethylene glycol 17 g packet Commonly known as: MIRALAX / GLYCOLAX Take 17 g by mouth daily as needed for mild constipation.   rosuvastatin 20 MG tablet Commonly known as: CRESTOR Take 1 tablet (20 mg total) by mouth daily.   tamsulosin 0.4 MG Caps capsule Commonly known as: FLOMAX Take 1 capsule (0.4 mg total) by mouth daily after supper.   thiamine 100 MG tablet Take 1 tablet (100 mg total) by mouth daily.               Discharge Care Instructions  (From admission, onward)           Start     Ordered   02/14/21 0000  Discharge wound care:       Comments: Keep dressing clean and dry, reinforced as needed and keep in place until follow up with orthopedic service.   02/14/21 1105           No Known Allergies  Contact information for follow-up providers  Lucia Gaskins, MD. Schedule an appointment as soon as possible for a visit in 10 day(s).   Specialty: Internal Medicine Why: after discharge from SNF. Contact information: [place] [address] [phone]         Mordecai Rasmussen, MD. Schedule an appointment as soon as possible for a visit in 2 week(s).   Specialties: Orthopedic Surgery, Sports Medicine Why: office will contact with appointment details. Contact information: [address] [place] [address] [phone]              Contact information for after-discharge care     [facility] .   Service: Skilled Nursing Contact information: [address] [place] [phone]                     The results of significant diagnostics from this hospitalization (including imaging, microbiology, ancillary and laboratory) are listed below for reference.    Significant Diagnostic Studies: DG Pelvis 1-2 Views  Result Date: 02/08/2021 CLINICAL DATA:  Mechanical fall, right hip pain EXAM: PELVIS - 1-2 VIEW COMPARISON:  None. FINDINGS: There is a mildly displaced transcervical right femoral neck fracture. 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 No additional pelvic ring fractures are identified radiographically on single frontal view. Vascular calcifications. IMPRESSION: Mildly displaced transcervical right femoral neck fracture. Electronically Signed   By: Maurine Simmering   On: 02/08/2021 12:22   CT Head Wo Contrast  Result Date: 02/08/2021 CLINICAL DATA:  Status post fall this morning. 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 EXAM: CT HEAD WITHOUT CONTRAST TECHNIQUE: Contiguous axial images were obtained from the base of the skull through the vertex without intravenous contrast. COMPARISON:  Brain MRI 12/24/2020. FINDINGS: Brain: No evidence of acute infarction, hemorrhage, hydrocephalus, extra-axial collection or mass lesion/mass effect. Cortical atrophy and extensive chronic microvascular ischemic change are again seen. Also again seen is lacunar infarction in the left basal ganglia. Vascular: No hyperdense vessel or unexpected calcification. Skull: Intact.  No focal lesion. Sinuses/Orbits: Negative. Other: None. IMPRESSION: No acute abnormality. Atrophy and chronic microvascular ischemic change. Electronically Signed   By: Inge Rise M.D.   On: 02/08/2021 12:31   DG Chest Port 1 View  Result Date: 02/09/2021 CLINICAL DATA:  Fall. EXAM: PORTABLE CHEST 1 VIEW COMPARISON:  CT 12/24/2020.  Chest x-ray 09/24/2020. FINDINGS: PowerPort catheter in stable position. Stable cardiomegaly. Stable bilateral pleural-parenchymal  thickening most consistent scarring. No acute infiltrate. No pleural effusion or pneumothorax. No displaced rib fracture noted. IMPRESSION: 1.  PowerPort catheter stable position. 2.  Stable cardiomegaly. 3. Stable changes of bilateral pleural-parenchymal scarring. No evidence of displaced rib fracture or pneumothorax. Electronically Signed   By: Marcello Moores  Register   On: 02/09/2021 10:43   DG Knee Complete 4 Views Right  Result Date: 02/08/2021 CLINICAL DATA:  Fall.  Right hip pain. EXAM: RIGHT KNEE - COMPLETE 4+ VIEW COMPARISON:  None. FINDINGS: No fracture. No subluxation or dislocation. Calcification of the menisci evident. No joint effusion. Mild hypertrophic spurring noted all 3 compartments. IMPRESSION: 1. No acute bony abnormality. 2. 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 Electronically Signed   By: Misty Stanley M.D.   On: 02/08/2021 12:21   DG HIP OPERATIVE UNILAT WITH PELVIS RIGHT  Result Date: 02/10/2021 CLINICAL DATA:  Operative images right hip EXAM: DG HIP (WITH OR WITHOUT PELVIS) 2-3V RIGHT; OPERATIVE RIGHT HIP WITH PELVIS COMPARISON:  02/08/2021 FINDINGS: Three Knowles pins are placed for treatment of a femoral neck fracture. Components appear well position with restoration of anatomic alignment. IMPRESSION: Good appearance following operative reduction and fixation of a right femoral neck fracture. Electronically Signed   By: Nelson Chimes M.D.   On: 02/10/2021 14:57   DG HIP UNILAT WITH PELVIS 2-3 VIEWS RIGHT  Result Date: 02/10/2021 CLINICAL DATA:  Operative images right hip EXAM: DG HIP (WITH OR WITHOUT PELVIS) 2-3V RIGHT; OPERATIVE RIGHT HIP WITH PELVIS COMPARISON:  02/08/2021 FINDINGS: Three Knowles pins are placed for treatment of a femoral neck fracture. Components appear well position with restoration of anatomic alignment. IMPRESSION: Good appearance following operative reduction and fixation of a right femoral neck fracture. Electronically Signed   By:  Nelson Chimes M.D.   On: 02/10/2021 14:57   DG Femur Min 2 Views Right  Result Date: 02/08/2021 CLINICAL DATA:  Fall, right hip pain EXAM: RIGHT FEMUR 2 VIEWS COMPARISON:  None. FINDINGS: There is a mildly displaced transcervical left femoral neck fracture. There is mild to moderate right hip arthritis. There is no distal femur fracture. Vascular calcifications. IMPRESSION: Mildly displaced transcervical right femoral neck fracture. Electronically Signed   By: Maurine Simmering   On: 02/08/2021 12:21   DG HIPS BILAT WITH PELVIS 3-4 VIEWS  Result Date: 02/13/2021 CLINICAL DATA:  Bilateral hip pain status post fall. EXAM: DG HIP (WITH OR WITHOUT PELVIS) 3-4V BILAT COMPARISON:  02/10/2021 FINDINGS: Atherosclerotic changes seen throughout visualized arterial segments. Postsurgical changes of right femoral fixation again seen. The hardware is intact without periprosthetic fracture or lucency. Alignment of the right femoral fracture is unchanged. IMPRESSION: Unchanged appearance of recent right femoral ORIF. No new acute abnormality of the pelvis or hips. Electronically Signed   By: Miachel Roux M.D.   On: 02/13/2021 13:11    Microbiology: Recent Results (from the past 240 hour(s))  Resp Panel by RT-PCR (Flu A&B, Covid) Nasopharyngeal Swab     Status: None   Collection Time: 02/08/21  1:53 PM   Specimen: Nasopharyngeal Swab; Nasopharyngeal(NP) swabs in vial transport medium  Result Value Ref Range Status   SARS Coronavirus 2 by RT PCR NEGATIVE NEGATIVE Final    Comment: (NOTE) SARS-CoV-2 target nucleic acids are NOT DETECTED.  The SARS-CoV-2 RNA is generally detectable in upper respiratory specimens during the acute phase of infection. The lowest concentration of SARS-CoV-2 viral copies this assay can detect is 138 copies/mL. A negative result does not preclude SARS-Cov-2 infection and should not be used as the sole basis for treatment or other patient management decisions. A negative result may occur  with  improper specimen collection/handling, submission of specimen other than nasopharyngeal swab, presence of viral mutation(s) within the areas targeted by this assay, and inadequate number of viral copies(<138 copies/mL). A negative result must be combined with clinical observations, patient history, and epidemiological information. The expected result is Negative.  Fact Sheet for Patients:  EntrepreneurPulse.com.au  Fact Sheet for Healthcare Providers:  IncredibleEmployment.be  This test is no t yet approved or cleared by the [place] FDA and  has been authorized for detection and/or diagnosis of SARS-CoV-2 by FDA under an Emergency Use Authorization (EUA). This EUA will remain  in effect (meaning this test can be used) for the duration of the COVID-19 declaration under Section 564(b)(1) of the Act, 21 U.S.C.section 360bbb-3(b)(1), unless the authorization is terminated  or revoked sooner.       Influenza A by PCR NEGATIVE NEGATIVE Final  Influenza B by PCR NEGATIVE NEGATIVE Final    Comment: (NOTE) The Xpert Xpress SARS-CoV-2/FLU/RSV plus assay is intended as an aid in the diagnosis of influenza from Nasopharyngeal swab specimens and should not be used as a sole basis for treatment. Nasal washings and aspirates are unacceptable for Xpert Xpress SARS-CoV-2/FLU/RSV testing.  Fact Sheet for Patients: EntrepreneurPulse.com.au  Fact Sheet for Healthcare Providers: IncredibleEmployment.be  This test is not yet approved or cleared by the [place] FDA and has been authorized for detection and/or diagnosis of SARS-CoV-2 by FDA under an Emergency Use Authorization (EUA). This EUA will remain in effect (meaning this test can be used) for the duration of the COVID-19 declaration under Section 564(b)(1) of the Act, 21 U.S.C. section 360bbb-3(b)(1), unless the authorization is terminated  or revoked.  Performed at [facility], [address] [place], [place] [address]   Surgical pcr screen     Status: None   Collection Time: 02/10/21  4:09 AM   Specimen: Nasal Mucosa; Nasal Swab  Result Value Ref Range Status   MRSA, PCR NEGATIVE NEGATIVE Final   Staphylococcus aureus NEGATIVE NEGATIVE Final    Comment: (NOTE) The Xpert SA Assay (FDA approved for NASAL specimens in patients 76 years of age and older), is one component of a comprehensive surveillance program. It is not intended to diagnose infection nor to guide or monitor treatment. Performed at [facility], [address], [place], [place] [address]      Labs: Basic Metabolic Panel: Recent Labs  Lab 02/08/21 1306 02/09/21 0533 02/10/21 0608 02/11/21 0509 02/13/21 0354  NA 141 138 137 136 138  K 4.1 4.0 3.8 4.1 3.7  CL 104 103 103 100 101  CO2 28 27 25 28 30   GLUCOSE 94 92 109* 110* 110*  BUN 21 22 17 19 14   CREATININE 0.60* 0.75 0.63 0.69 0.67  CALCIUM 9.6 9.0 8.9 8.8* 8.5*  MG  --  2.0 2.1 2.0  --    CBC: Recent Labs  Lab 02/08/21 1306 02/09/21 0533 02/10/21 0608 02/11/21 0509 02/13/21 0354  WBC 14.2* 10.4 10.1 10.8* 7.9  NEUTROABS 12.3*  --   --   --   --   HGB 14.4 13.0 13.7 13.2 13.1  HCT 44.4 39.7 41.8 39.9 39.9  MCV 95.9 95.4 94.6 94.5 94.3  PLT 233 208 195 188 228   Cardiac Enzymes:  BG: Recent Labs  Lab 02/12/21 2047 02/13/21 0722 02/13/21 1125 02/13/21 1620 02/14/21 0916  GLUCAP 111* 122* 97 113* 128*    Signed:  Barton Dubois MD.  [facility] 02/14/2021, 11:11 AM

## 2021-02-14 NOTE — Progress Notes (Addendum)
Physical Therapy Treatment Patient Details Name: [name] MRN: [id number] DOB: 1945/12/23 Today's Date: 02/14/2021    History of Present Illness [name] is a 75 y.o. male with medical history significant for COPD, type 2 diabetes, hypertension, dyslipidemia, hypothyroidism, and prior CVA who presented to the ED after a mechanical fall at home that resulted in severe right hip pain.  There is no lightheadedness, dizziness, or loss of consciousness and he states that he had bumped his head as well.  No other symptoms have been reported.    PT Comments    Patient presents sitting up in bed and agreeable to therapy. Patient required min assisted for bed mobility and transfer to sitting at EOB. Patient demonstrated an improvement in independence and activity tolerance for therapeutic activity and pre gait exercise. During transfers and ambulation patient was noted to have improved control and strength of RLE improving his safety during transfer. Patient still required mod assist for transfer and was limited to a few short steps at the bed side due to fatigue. Patient demonstrated good return to the chair concluding therapy. Patient will benefit from continued physical therapy in hospital and recommended venue below to increase strength, balance, endurance for safe ADLs and gait.     Follow Up Recommendations  SNF     Equipment Recommendations       Recommendations for Other Services       Precautions / Restrictions Precautions Precautions: Fall Restrictions Weight Bearing Restrictions: Yes RLE Weight Bearing: Weight bearing as tolerated    Mobility  Bed Mobility Overal bed mobility: Needs Assistance Bed Mobility: Supine to Sit     Supine to sit: Min assist;Mod assist     General bed mobility comments: slow labored movement, increased time Patient Response: Cooperative  Transfers Overall transfer level: Needs assistance Equipment used: Rolling walker (2  wheeled) Transfers: Sit to/from [facility] Sit to Stand: Min assist Stand pivot transfers: Mod assist       General transfer comment: PT providing assistance anterior; OT min guard to assist with lock of R knee, but pt improved ability to weight bear on knee this date. No significant assist needed to lock R knee during transfer.  Ambulation/Gait Ambulation/Gait assistance: Mod assist Gait Distance (Feet): 4 Feet Assistive device: Rolling walker (2 wheeled) Gait Pattern/deviations: Decreased step length - left;Decreased stance time - right;Decreased stride length;Narrow base of support;Decreased weight shift to right;Decreased step length - right Gait velocity: decreased   General Gait Details: slow labored movements, increase time, low WB tolerance throught RLE   Stairs             Wheelchair Mobility    Modified Rankin (Stroke Patients Only)       Balance Overall balance assessment: Needs assistance Sitting-balance support: No upper extremity supported;Feet supported Sitting balance-Leahy Scale: Fair Sitting balance - Comments: EOB; fair to good   Standing balance support: Bilateral upper extremity supported;During functional activity Standing balance-Leahy Scale: Fair Standing balance comment: using RW                            Cognition Arousal/Alertness: Awake/alert Behavior During Therapy: WFL for tasks assessed/performed Overall Cognitive Status: Within Functional Limits for tasks assessed                                        Exercises  General Exercises - Upper Extremity Shoulder Flexion: AROM;10 reps;Seated Shoulder Horizontal ABduction: AROM;10 reps;Seated Chair Push Up: 10 reps (Cuing to extended elbows; moderate difficulty) General Exercises - Lower Extremity Long Arc Quad: Seated;AROM;Strengthening;Both;10 reps Hip Flexion/Marching: Seated;AROM;Strengthening;Both;10 reps Toe Raises:  Seated;AROM;Strengthening;Both;10 reps Heel Raises: Seated;AROM;Strengthening;Both;10 reps    General Comments        Pertinent Vitals/Pain Pain Assessment: 0-10 Pain Score: 4  Faces Pain Scale: Hurts little more Pain Location: R hip Pain Descriptors / Indicators: Guarding;Grimacing Pain Intervention(s): Limited activity within patient's tolerance;Monitored during session;Repositioned    Home Living                      Prior Function            PT Goals (current goals can now be found in the care plan section) Acute Rehab PT Goals Patient Stated Goal: Return home. Pt open to rehab. PT Goal Formulation: With patient/family Time For Goal Achievement: 02/18/21 Potential to Achieve Goals: Good Progress towards PT goals: Progressing toward goals    Frequency    Min 4X/week      PT Plan Current plan remains appropriate    Co-evaluation PT/OT/SLP Co-Evaluation/Treatment: Yes Reason for Co-Treatment: To address functional/ADL transfers PT goals addressed during session: Mobility/safety with mobility;Strengthening/ROM OT goals addressed during session: ADL's and self-care;Strengthening/ROM      AM-PAC PT "6 Clicks" Mobility   Outcome Measure  Help needed turning from your back to your side while in a flat bed without using bedrails?: A Little Help needed moving from lying on your back to sitting on the side of a flat bed without using bedrails?: A Little Help needed moving to and from a bed to a chair (including a wheelchair)?: A Lot Help needed standing up from a chair using your arms (e.g., wheelchair or bedside chair)?: A Lot Help needed to walk in hospital room?: A Lot Help needed climbing 3-5 steps with a railing? : Total 6 Click Score: 13    End of Session   Activity Tolerance: Patient tolerated treatment well;Patient limited by fatigue Patient left: in chair;with call bell/phone within reach;with chair alarm set;with family/visitor present Nurse  Communication: Mobility status PT Visit Diagnosis: Unsteadiness on feet (R26.81);Difficulty in walking, not elsewhere classified (R26.2);Muscle weakness (generalized) (M62.81)     Time: 6789-3810 PT Time Calculation (min) (ACUTE ONLY): 20 min  Charges:  $Therapeutic Exercise: 8-22 mins $Therapeutic Activity: 8-22 mins                    11:17 AM, 02/14/21 Sinclair Ship SPT  11:17 AM, 02/14/21 Lonell Grandchild, MPT Physical Therapist with [facility] [phone] office [phone] mobile phone

## 2021-02-14 NOTE — Progress Notes (Signed)
Occupational Therapy Treatment Patient Details Name: [name] MRN: [id number] DOB: Dec 07, 1945 Today's Date: 02/14/2021    History of present illness [name] is a 75 y.o. male with medical history significant for COPD, type 2 diabetes, hypertension, dyslipidemia, hypothyroidism, and prior CVA who presented to the ED after a mechanical fall at home that resulted in severe right hip pain.  There is no lightheadedness, dizziness, or loss of consciousness and he states that he had bumped his head as well.  No other symptoms have been reported.   OT comments  Pt agreeable to OT/PT co-evaluation this date. Pt demonstrates improved mobility. Min to mod A needed for supine to sit bed mobility and min A for initial sit to stand from EOB with mod A for stand pivot transfer. Pt able to bear more weight on R LE without buckling of R knee. Pt able to complete UE strengthening in chair with moderate difficulty noted during chair push ups. Pt left in chair with chair alarm set and family present in room. Pt will benefit from continued OT in the acute setting to address deficit areas.   Follow Up Recommendations  SNF    Equipment Recommendations  None recommended by OT          Precautions / Restrictions Precautions Precautions: Fall Restrictions Weight Bearing Restrictions: Yes RLE Weight Bearing: Weight bearing as tolerated       Mobility Bed Mobility Overal bed mobility: Needs Assistance Bed Mobility: Supine to Sit     Supine to sit: Min assist;Mod assist     General bed mobility comments: slow labored movement, increased time    Transfers Overall transfer level: Needs assistance Equipment used: Rolling walker (2 wheeled) Transfers: Sit to/from [facility] Sit to Stand: Min assist Stand pivot transfers: Mod assist       General transfer comment: PT providing assistance andterior; OT min guard to assist with lock of R knee, but pt improved ability to weight  bear on knee this date. No significant assist needed to lock R knee during transfer.    Balance Overall balance assessment: Needs assistance Sitting-balance support: No upper extremity supported;Feet supported Sitting balance-Leahy Scale: Fair (fair to good) Sitting balance - Comments: EOB; fair to good   Standing balance support: Bilateral upper extremity supported;During functional activity Standing balance-Leahy Scale: Poor Standing balance comment: using RW                           ADL either performed or assessed with clinical judgement   ADL Overall ADL's : Needs assistance/impaired                         Toilet Transfer: Moderate assistance;RW;Minimal assistance;Stand-pivot Armed forces technical officer Details (indicate cue type and reason): Simulated via EOB to chair transfer.                                   Cognition Arousal/Alertness: Awake/alert Behavior During Therapy: WFL for tasks assessed/performed Overall Cognitive Status: Within Functional Limits for tasks assessed                                          Exercises Exercises: General Upper Extremity General Exercises - Upper Extremity Shoulder Flexion: AROM;10 reps;Seated Shoulder  Horizontal ABduction: AROM;10 reps;Seated Chair Push Up: 10 reps (Cuing to extended elbows; moderate difficulty)                Pertinent Vitals/ Pain       Pain Assessment: Faces Faces Pain Scale: Hurts little more Pain Location: R hip Pain Descriptors / Indicators: Guarding;Grimacing Pain Intervention(s): Limited activity within patient's tolerance;Monitored during session;Repositioned                                                          Frequency  Min 2X/week        Progress Toward Goals  OT Goals(current goals can now be found in the care plan section)  Progress towards OT goals: Progressing toward goals  Acute Rehab OT Goals Patient  Stated Goal: Return home. Pt open to rehab. OT Goal Formulation: With patient Time For Goal Achievement: 02/25/21 Potential to Achieve Goals: Good ADL Goals Pt Will Perform Grooming: with min assist;with min guard assist;standing;with adaptive equipment Pt Will Perform Lower Body Dressing: with min assist;with min guard assist;sitting/lateral leans;sit to/from stand;with adaptive equipment Pt Will Transfer to Toilet: with min assist;with min guard assist;stand pivot transfer Pt/caregiver will Perform Home Exercise Program: Increased strength;Both right and left upper extremity;With Supervision  Plan Discharge plan remains appropriate    Co-evaluation    PT/OT/SLP Co-Evaluation/Treatment: Yes Reason for Co-Treatment: To address functional/ADL transfers   OT goals addressed during session: ADL's and self-care;Strengthening/ROM                          End of Session Equipment Utilized During Treatment: Rolling walker  OT Visit Diagnosis: Unsteadiness on feet (R26.81);History of falling (Z91.81);Muscle weakness (generalized) (M62.81)   Activity Tolerance Patient tolerated treatment well   Patient Left in chair;with call bell/phone within reach;with chair alarm set;with family/visitor present             Time: 9323-5573 OT Time Calculation (min): 12 min  Charges: OT General Charges $OT Visit: 1 Visit OT Treatments $Self Care/Home Management : 8-22 mins  [facility] OT, MOT    Larey Seat 02/14/2021, 9:47 AM

## 2021-02-14 NOTE — Progress Notes (Signed)
   ORTHOPAEDIC PROGRESS NOTE  Status post:  CRPP R femoral neck fracture  DOS: 02/10/21  SUBJECTIVE: Sustained a fall early yesterday morning.  No new pain.  Continues to have pain in his right hip.  Having difficulty with urinary frequency, foley placed.  No numbness or tingling.   OBJECTIVE: PE:  Alert and oriented, no acute distress  Lateral thigh dressing is intact; small amount of dry blood on dressing.  Small laceration over anterior tibia Toes are warm and well perfused Active motion intact in the TA/EHL Sensation intact over the dorsum of his foot.  Able to maintain a straight leg raise, short of full extension.   Vitals:   02/13/21 2101 02/14/21 0534  BP: 124/79 112/81  Pulse: 70 84  Resp: 18 19  Temp: (!) 97.4 F (36.3 C) 97.9 F (36.6 C)  SpO2: 94% 94%   XR of bilateral hips obtained.  No new injuries.  Hardware in right hip remains in good position.   ASSESSMENT: [name] is a 75 y.o. male doing well, POD#2.  Waiting for placement  PLAN: Weightbearing: WBAT RLE Insicional and dressing care: Reinforce dressings as needed Orthopedic device(s): None VTE prophylaxis: At discretion of primary team; recommend 81 mg aspirin BID Pain control: PO medications, PRN.  Judicious use of narcotics Follow - up plan: 2 weeks postop; office will contact patient to schedule follow up appointment.    Contact information:     Dahl Higinbotham A. Amedeo Kinsman, MD [place] [address] [place],  [place]  [address] Phone: [phone] Fax: [phone]

## 2021-02-14 NOTE — Care Management Important Message (Signed)
Important Message  Patient Details  Name: [name] MRN: [id number] Date of Birth: October 18, 1945   Medicare Important Message Given:  Yes     [name] 02/14/2021, 1:40 PM

## 2021-02-14 NOTE — TOC Transition Note (Signed)
Transition of Care [facility]) - CM/SW Discharge Note   Patient Details  Name: [name] MRN: [id number] Date of Birth: 1946/01/11  Transition of [facility]) CM/SW Contact:  Salome Arnt, LCSW Phone Number: 02/14/2021, 11:49 AM   Clinical Narrative:  Per Fortun[facility] Curling, they are able to accept pt and this is now pt's first choice. Pt stable for d/c today. D/C summary sent to [facility]. Discussed d/c with pt's sister, Quita Skye who requests transport via [facility]. RN given number to call report.     Final next level of care: Skilled Nursing Facility Barriers to Discharge: Barriers Resolved   Patient Goals and CMS Choice Patient states their goals for this hospitalization and ongoing recovery are:: go home CMS Medicare.gov Compare Post Acute Care list provided to:: Patient Choice offered to / list presented to : Patient  Discharge Placement   Existing PASRR number confirmed : 02/14/21          Patient chooses bed at: Other - please specify in the comment section below: [facility] SNF) Patient to be transferred to facility by: [facility] Name of family member notified: Quita Skye, sister Patient and family notified of of transfer: 02/14/21  Discharge Plan and Services                DME Arranged: N/A                    Social Determinants of Health (SDOH) Interventions     Readmission Risk Interventions No flowsheet data found.

## 2021-02-14 NOTE — Progress Notes (Signed)
Pt discharged via EMS to [facility]. Pt belongings with EMS per wife's request.

## 2021-03-02 ENCOUNTER — Encounter (HOSPITAL_COMMUNITY): Payer: Self-pay | Admitting: Hematology

## 2021-03-02 ENCOUNTER — Encounter: Payer: Self-pay | Admitting: Orthopedic Surgery

## 2021-03-02 ENCOUNTER — Ambulatory Visit: Payer: Medicare Other

## 2021-03-02 ENCOUNTER — Other Ambulatory Visit: Payer: Self-pay

## 2021-03-02 ENCOUNTER — Ambulatory Visit (INDEPENDENT_AMBULATORY_CARE_PROVIDER_SITE_OTHER): Payer: Medicare Other | Admitting: Orthopedic Surgery

## 2021-03-02 VITALS — Ht 71.0 in | Wt 162.0 lb

## 2021-03-02 DIAGNOSIS — S72001D Fracture of unspecified part of neck of right femur, subsequent encounter for closed fracture with routine healing: Secondary | ICD-10-CM | POA: Diagnosis not present

## 2021-03-02 DIAGNOSIS — S72001S Fracture of unspecified part of neck of right femur, sequela: Secondary | ICD-10-CM

## 2021-03-03 ENCOUNTER — Encounter: Payer: Self-pay | Admitting: Orthopedic Surgery

## 2021-03-03 NOTE — Progress Notes (Signed)
Orthopaedic Postop Note  Assessment: [name] is a 75 y.o. male s/p CRPP of the right femoral neck fracture  DOS: 02/10/2021  Plan: Sutures were trimmed, Steri-Strips were placed.  Repeat x-rays today demonstrate maintenance of alignment.  The screws have backed out just a little bit.  However, the screws continued to traverse the fracture and are in a good position.  They are not prominent on the lateral hip.  He can continue with weightbearing as tolerated.  Encouraged him to work with therapy.  Medications as needed.  Follow-up in 4 weeks.   Follow-up: Return in about 4 weeks (around 03/30/2021). XR at next visit: Right hip x-rays  Subjective:  Chief Complaint  Patient presents with   Routine Post Op    Rt hip DOS 02/10/21    History of Present Illness: [name] is a 75 y.o. male who presents following the above stated procedure.  He is approximate 3 weeks out from surgery, doing well overall.  He has been discharged to a nursing facility.  His pain has been controlled.  He is ambulating with assistance, although he has remained largely in bed.  No fevers or chills.  No other issues to report.  He is asking when he can be discharged from the facility and go home.  Review of Systems: No fevers or chills No numbness or tingling No Chest Pain No shortness of breath   Objective: Ht 5\' 11"  (1.803 m)   Wt 162 lb (73.5 kg)   BMI 22.59 kg/m   Physical Exam:  Alert and oriented.  No acute distress.  Seated in a wheelchair.  Somber mood.  Evaluation of the right hip demonstrates a surgical incision which is healing well.  No surrounding erythema or drainage.  No tenderness to palpation.  The hardware cannot be palpated on the lateral hip.  He tolerates gentle range of motion.  He is able to maintain a straight leg raise.  Active dorsiflexion of the ankle and the great toe is intact.  Toes are warm and well-perfused.  IMAGING: I personally ordered and reviewed the following  images:  X-rays of the right hip were obtained in clinic today and demonstrates maintenance of alignment.  There is no interval displacement of the femoral neck fracture.  The screws on the lateral cortex have backed out a little bit, but the partially-threaded screws continue to do traverse the fracture site.  No adverse features.  Impression: Right nondisplaced femoral neck fracture in good alignment status post closed reduction percutaneous pinning.   Mordecai Rasmussen, MD 03/03/2021 7:18 AM

## 2021-03-30 ENCOUNTER — Other Ambulatory Visit: Payer: Self-pay

## 2021-03-30 ENCOUNTER — Encounter: Payer: Self-pay | Admitting: Orthopedic Surgery

## 2021-03-30 ENCOUNTER — Ambulatory Visit (INDEPENDENT_AMBULATORY_CARE_PROVIDER_SITE_OTHER): Payer: Medicare Other | Admitting: Orthopedic Surgery

## 2021-03-30 ENCOUNTER — Ambulatory Visit: Payer: Medicare Other

## 2021-03-30 VITALS — Ht 71.0 in | Wt 162.0 lb

## 2021-03-30 DIAGNOSIS — S72001S Fracture of unspecified part of neck of right femur, sequela: Secondary | ICD-10-CM

## 2021-03-30 DIAGNOSIS — S72001D Fracture of unspecified part of neck of right femur, subsequent encounter for closed fracture with routine healing: Secondary | ICD-10-CM | POA: Diagnosis not present

## 2021-03-30 NOTE — Progress Notes (Signed)
Orthopaedic Postop Note  Assessment: [name] is a 75 y.o. male s/p CRPP of the right femoral neck fracture  DOS: 02/10/2021  Plan: He is healing well following surgery.  Reviewed the radiographs in clinic today which demonstrates appropriate alignment, with slight subsidence of the screws.  On physical exam, the screws are not prominent laterally.  There is been minimal change of the screw positioning compared to the most recent x-rays.  We will provide him with a home health physical therapy referral, and instructed him to let us know if there is anything else he needs as he transitions back home following his stay in a skilled nursing facility.  Continue with activities as tolerated.  Walking is excellent exercise, and will strengthen both the bone, as well as improve his overall endurance.  We will see him back in 6 weeks.  They will call for any issues.    Follow-up: Return in about 6 weeks (around 05/11/2021). XR at next visit: Right hip x-rays  Subjective:  Chief Complaint  Patient presents with   Routine Post Op    Rt hip DOS 02/10/21    History of Present Illness: [name] is a 75 y.o. male who presents following the above stated procedure.   Surgery was approximately 6 weeks ago, and he continues to improve.  He was discharged to skilled nursing facility just today.  He will be going home, and staying with family starting tomorrow.  They did place a home health PT referral once he left the facility, but nothing has happened with this yet.  He has all the equipment that he needs at home.  Pain is improving.  He does note some general pain around the right hip.  To the surgical incision.  Review of Systems: No fevers or chills No numbness or tingling No Chest Pain No shortness of breath   Objective: Ht 5\' 11"  (1.803 m)   Wt 162 lb (73.5 kg)   BMI 22.59 kg/m   Physical Exam:  Alert and oriented.  No acute distress.   Somber mood.  He has a stooped gait using a  walker.  Otherwise his gait is slow and steady.  Right lateral hip incision is healing well without surrounding erythema or drainage.  No tenderness to palpation over the lateral hip.  The screws are not prominent.  He is able to maintain a straight leg raise.  Gentle range of motion of the right hip is tolerated without discomfort.  Active motion intact in the TA/EHL.  IMAGING: I personally ordered and reviewed the following images:  X-rays of the right hip were obtained in clinic today, compared to previous x-rays.  Femoral neck fracture remains in good alignment.  There is been no interval displacement of fracture.  The screws remain in good position, with minimal change in their position.  There are slightly prominent over the lateral cortex.  No acute injuries are noted   Impression: Nondisplaced right femoral neck fracture in excellent alignment, without hardware failure or loosening.   Mordecai Rasmussen, MD 03/30/2021 11:00 AM

## 2021-03-31 ENCOUNTER — Encounter: Payer: Self-pay | Admitting: Nurse Practitioner

## 2021-03-31 ENCOUNTER — Encounter (HOSPITAL_COMMUNITY): Payer: Self-pay | Admitting: Hematology

## 2021-03-31 ENCOUNTER — Other Ambulatory Visit: Payer: Medicare Other | Admitting: Nurse Practitioner

## 2021-03-31 DIAGNOSIS — R058 Other specified cough: Secondary | ICD-10-CM

## 2021-03-31 DIAGNOSIS — R5381 Other malaise: Secondary | ICD-10-CM

## 2021-03-31 DIAGNOSIS — Z515 Encounter for palliative care: Secondary | ICD-10-CM
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
## 2021-04-05 DIAGNOSIS — E039 Hypothyroidism, unspecified: Secondary | ICD-10-CM | POA: Insufficient documentation

## 2021-04-05 DIAGNOSIS — Z9181 History of falling: Secondary | ICD-10-CM | POA: Insufficient documentation

## 2021-04-05 DIAGNOSIS — E559 Vitamin D deficiency, unspecified: Secondary | ICD-10-CM | POA: Insufficient documentation

## 2021-04-05 DIAGNOSIS — I10 Essential (primary) hypertension: Secondary | ICD-10-CM | POA: Diagnosis present

## 2021-04-05 DIAGNOSIS — N400 Enlarged prostate without lower urinary tract symptoms: Secondary | ICD-10-CM | POA: Insufficient documentation

## 2021-04-05 DIAGNOSIS — E538 Deficiency of other specified B group vitamins: Secondary | ICD-10-CM | POA: Insufficient documentation

## 2021-04-05 DIAGNOSIS — C7931 Secondary malignant neoplasm of brain: Secondary | ICD-10-CM | POA: Insufficient documentation

## 2021-04-05 DIAGNOSIS — Z8673 Personal history of transient ischemic attack (TIA), and cerebral infarction without residual deficits: Secondary | ICD-10-CM | POA: Insufficient documentation

## 2021-04-05 DIAGNOSIS — C3490 Malignant neoplasm of unspecified part of unspecified bronchus or lung: Secondary | ICD-10-CM | POA: Insufficient documentation

## 2021-04-05 DIAGNOSIS — E785 Hyperlipidemia, unspecified: Secondary | ICD-10-CM | POA: Insufficient documentation

## 2021-04-06 ENCOUNTER — Telehealth: Payer: Self-pay | Admitting: Orthopedic Surgery

## 2021-04-06 NOTE — Telephone Encounter (Signed)
Order given to Tresea Mall at [employer]

## 2021-04-06 NOTE — Telephone Encounter (Signed)
Call received from [facility] health/physical therapist, per Santiago Glad, requesting verbal orders if Dr Amedeo Kinsman will okay continuing home therapy. Her direct phone# is [phone]. States they would like to see patient today, 04/06/21. Also states that patient is currently looking for a new primary care provider since Dr Cindie Laroche has retired. Please advise.

## 2021-04-18 ENCOUNTER — Telehealth: Payer: Self-pay | Admitting: Orthopedic Surgery

## 2021-04-18 NOTE — Telephone Encounter (Signed)
I called [name]' home phone 2 times and left messages.  I then saw a note stating to call his sister since [name] is hard of hearing.  I called and spoke to her.  I told her that I was trying to reach him.  She said she knew that the therapist was going to call us due to [place] falling.  I told her that I wanted to see if he wanted to come in before October 3rd when we have him scheduled.  She said she would get with him and have him call us to let us know if he is in a lot of pain and needs to come in sooner.

## 2021-04-18 NOTE — Telephone Encounter (Signed)
Done

## 2021-04-18 NOTE — Telephone Encounter (Signed)
[name] from [phone]) called today stating that he saw [name] today.  Apparently [name] fell onto his right hip Saturday night.  [name] [employer] is moving around but with an increase in pain, 8 out of 10.  His surgery was 02/10/21.  We have him scheduled to come back into the office on 05/11/21  I have tried to call him to give him an appointment to come in sooner.

## 2021-04-19 ENCOUNTER — Encounter: Payer: Self-pay | Admitting: Orthopedic Surgery

## 2021-04-19 ENCOUNTER — Ambulatory Visit (INDEPENDENT_AMBULATORY_CARE_PROVIDER_SITE_OTHER): Payer: Medicare Other | Admitting: Orthopedic Surgery

## 2021-04-19 ENCOUNTER — Ambulatory Visit: Payer: Medicare Other

## 2021-04-19 ENCOUNTER — Other Ambulatory Visit: Payer: Self-pay

## 2021-04-19 VITALS — Ht 71.0 in | Wt 162.0 lb

## 2021-04-19 DIAGNOSIS — S72001D Fracture of unspecified part of neck of right femur, subsequent encounter for closed fracture with routine healing: Secondary | ICD-10-CM | POA: Diagnosis not present

## 2021-04-19 DIAGNOSIS — S72001S Fracture of unspecified part of neck of right femur, sequela: Secondary | ICD-10-CM

## 2021-04-20 ENCOUNTER — Encounter: Payer: Self-pay | Admitting: Orthopedic Surgery

## 2021-04-20 NOTE — Progress Notes (Signed)
Orthopaedic Postop Note  Assessment: [name] is a 75 y.o. male s/p CRPP of the right femoral neck fracture  DOS: 02/10/2021  Plan: Recent fall, landing directly on his buttocks.  As result, he return to clinic earlier than expected.  Radiographs obtained in clinic today demonstrates no obvious fracture within the pelvis or the right hip.  Hardware remains intact.  There has been no additional backing out of the screws.  It is possible that he has sustained a nondisplaced fracture, which is not easily identified on x-rays obtained today.  He may also have sustained a mild compression fracture.  Nonetheless, he can continue with weightbearing as tolerated.  If he continues to have issues, we could entertain the idea of obtaining a CT scan, but I do not think this would change management.  All questions were answered and he is amenable to this plan.  We will see him back in approximately 1 month for repeat evaluation of the right hip.    Follow-up: Return in about 4 weeks (around 05/17/2021). XR at next visit: Right hip x-rays  Subjective:  Chief Complaint  Patient presents with   Routine Post Op    Pt has had 2 falls since last visit. Pt states majority of his pain came after 2nd fall.    History of Present Illness: [name] is a 75 y.o. male who presents following the above stated procedure.   Surgery was approximately 2 months ago.  He has been progressing following surgery, but reports falling twice, landing directly onto his buttocks.  Since the second fall, he has had worsening pain.  Pain is diffuse in the lower back and hip area.  No numbness or tingling.  No fevers or chills.  He continues to ambulate with the assistance of a walker.  Review of Systems: No fevers or chills No numbness or tingling No Chest Pain No shortness of breath   Objective: Ht 5\' 11"  (1.803 m)   Wt 162 lb (73.5 kg)   BMI 22.59 kg/m   Physical Exam:  Alert and oriented.  No acute distress.    Somber mood.  He has a stooped gait using a walker.  Otherwise his gait is slow and steady.  He has tenderness to palpation over the lateral hip.  Tenderness to palpation within the right buttock.  He is able to maintain a straight leg raise.  He tolerates gentle range of motion, without groin pain.  Sensation is intact distally.  The surgical incision is healing well without surrounding erythema or drainage.  IMAGING: I personally ordered and reviewed the following images:  X-rays of the right hip were obtained in clinic today and demonstrates no acute injuries.  There is no obvious fracture within the superior or inferior pubic rami bilaterally.  The SI joints are intact.  No identifiable injury within the sacrum.  Right femoral neck fracture remains well aligned.  No evidence of hardware failure or loosening.  The screws have not subsided.  They are not backing out more, in comparison to previous x-rays.  Impression: Nondisplaced right femoral neck fracture, in good alignment.  No additional injuries are noted.   John Rasmussen, MD 04/20/2021 8:37 AM

## 2021-04-25 ENCOUNTER — Telehealth: Payer: Self-pay | Admitting: *Deleted

## 2021-04-25 ENCOUNTER — Other Ambulatory Visit: Payer: Self-pay | Admitting: *Deleted

## 2021-04-25 DIAGNOSIS — C7931 Secondary malignant neoplasm of brain: Secondary | ICD-10-CM

## 2021-04-25 NOTE — Telephone Encounter (Signed)
Patients sister called to report increasing concern for brother.  States he has been falling more and more and last night the family felt it was unsafe for him to stay at home by himself because of the increased falls that a sibling had to call out of work.  She reports concern because of his history of CVA.  She states he is cognitively aware of that is happening during the falls and is able to follow commands.  She states that his incontinence of bowel and bladder have gotten worse over the past 2 months.    She wanted to know if an appointment would be warranted.  Other sibling would have to get the patient into the office as sister is unable due to new injury.  Next MRI isn't due until 06/17/2021 which hasn't been scheduled as of yet.  Routed to Dr Mickeal Skinner to please advise.

## 2021-04-25 NOTE — Telephone Encounter (Signed)
Patient got MRI scheduled sooner and will be evaluated same day of MRI.

## 2021-04-26 ENCOUNTER — Telehealth: Payer: Self-pay | Admitting: Nurse Practitioner

## 2021-04-26 NOTE — Telephone Encounter (Signed)
I returned [name]'s, [name] sister call. [name] has had multiple falls, worsening gait, incontinence has progressed to complete. [name] was asking questions about placement. We talked about FL2 from primary. [name] endorses she made [name] an Neurology appointment to take him too. We talked about option of Hospice services to hep with progression of disease to end part of [name] life. [name] and I talked about [name] resistance to extra help from Hospice or facility placement. We talked about seeing if [name] was interested in placement, then can help facilitate. [name] endorses she will see what the Neurologist will say. Questions answered.   Total time 20 minutes Documentation 5 minutes Phone discussion 15 minutes

## 2021-04-27 DIAGNOSIS — I351 Nonrheumatic aortic (valve) insufficiency: Secondary | ICD-10-CM | POA: Diagnosis not present

## 2021-04-29 ENCOUNTER — Telehealth: Payer: Self-pay

## 2021-04-29 NOTE — Telephone Encounter (Signed)
1209 pm.  Phone call made to sister Armstead Peaks to follow up on patient's hospital location.  She reports patient is currently at [facility] due to a recent stroke.  Patient will be going to [facility] for short term rehab but uncertain when discharge will take place.  Christin Gusler, NP and Lorelee Market, LPN notified of above information.

## 2021-05-02 ENCOUNTER — Ambulatory Visit: Payer: TRICARE For Life (TFL) | Admitting: Internal Medicine

## 2021-05-02 ENCOUNTER — Ambulatory Visit (HOSPITAL_COMMUNITY): Payer: Medicare Other

## 2021-05-03 ENCOUNTER — Telehealth: Payer: Self-pay

## 2021-05-03 NOTE — Telephone Encounter (Signed)
924 am.  Phone call made to sister Armstead Peaks to follow up on patient disposition.  No answer but message has been left requesting a call back.

## 2021-05-11 ENCOUNTER — Ambulatory Visit (INDEPENDENT_AMBULATORY_CARE_PROVIDER_SITE_OTHER): Payer: Medicare Other | Admitting: Orthopedic Surgery

## 2021-05-11 ENCOUNTER — Other Ambulatory Visit: Payer: Self-pay

## 2021-05-11 ENCOUNTER — Ambulatory Visit: Payer: Medicare Other

## 2021-05-11 ENCOUNTER — Encounter: Payer: Self-pay | Admitting: Orthopedic Surgery

## 2021-05-11 VITALS — Ht 71.0 in | Wt 162.0 lb

## 2021-05-11 DIAGNOSIS — S72001S Fracture of unspecified part of neck of right femur, sequela: Secondary | ICD-10-CM

## 2021-05-11 DIAGNOSIS — S72001D Fracture of unspecified part of neck of right femur, subsequent encounter for closed fracture with routine healing: Secondary | ICD-10-CM | POA: Diagnosis not present

## 2021-05-11 NOTE — Progress Notes (Signed)
Orthopaedic Postop Note  Assessment: [name] is a 75 y.o. male s/p CRPP of the right femoral neck fracture  DOS: 02/10/2021  Plan: Patient continues to do well from a right hip surgical perspective.  Unfortunately, he has been diagnosed with a stroke recently, as well as a compression fracture, according to family in clinic today.  He has returned to a skilled nursing facility.  In regards to his right hip, he has occasional pain in the buttock area.  No pain in the groin.  No pain over the lateral hip.  I have urged him to continue working with therapy, and ambulate as much as possible.  Given his other medical issues at this time, I will not schedule follow-up.  If he has any issues in the future, I have asked him to contact the clinic.  He stated his understanding.    Follow-up: No follow-ups on file. XR at next visit: Right hip x-rays  Subjective:  Chief Complaint  Patient presents with   Routine Post Op    Rt hip DOS 02/10/21    History of Present Illness: [name] is a 75 y.o. male who presents following the above stated procedure.   Surgery was approximately 3 months ago.  Since he was last seen in the clinic, he has been diagnosed with a stroke, as well as a vertebral compression fracture.  He is back in a skilled nursing facility.  Overall, he is stable.  He has no pain in his right hip.  He does have some pain in the right buttock area.  He is able to ambulate, with assistance, but due to his other medical issues ongoing, he is limited in his ability to ambulate.  No fevers or chills.  No issues with the surgical incisions.   Review of Systems: No fevers or chills No numbness or tingling No Chest Pain No shortness of breath   Objective: Ht 5\' 11"  (1.803 m)   Wt 162 lb (73.5 kg)   BMI 22.59 kg/m   Physical Exam:  Alert and oriented.  No acute distress.   Somber mood.  He has a stooped gait using a walker.    Surgical incision is healed, without  surrounding erythema or drainage.  Screws are not prominent laterally.  Minimal tenderness to palpation over the lateral hip.  He is able to maintain a straight leg raise.  He tolerates gentle range of motion in his right hip.  No pain with axial loading.  Toes are warm and well perfused.  Active motion intact in the EHL/TA.  IMAGING: I personally ordered and reviewed the following images:  X-rays of the right hip were obtained in clinic today and demonstrates unchanged alignment of the femoral neck fracture.  There has been no further displacement or backing out of the screws.  The positioning of the screws across the fracture site, and on the lateral cortex remain unchanged.  Interval consolidation of the fracture site.  Impression: Healing right femoral neck fracture, following operative fixation   Mordecai Rasmussen, MD 05/11/2021 11:10 AM

## 2021-05-16 ENCOUNTER — Telehealth: Payer: Self-pay

## 2021-05-16 NOTE — Telephone Encounter (Signed)
157 pm.  Phone call made to [facility] to see if patient was a resident there.  Receptionist confirms patient currently resides at the facility.  Christin Gusler, NP has been notified of patient's location.

## 2021-06-20 ENCOUNTER — Emergency Department (HOSPITAL_COMMUNITY): Payer: Medicare Other

## 2021-06-20 ENCOUNTER — Encounter (HOSPITAL_COMMUNITY): Payer: Self-pay | Admitting: *Deleted

## 2021-06-20 ENCOUNTER — Other Ambulatory Visit: Payer: Self-pay

## 2021-06-20 ENCOUNTER — Inpatient Hospital Stay (HOSPITAL_COMMUNITY)
Admission: EM | Admit: 2021-06-20 | Discharge: 2021-06-24 | DRG: 177 | Disposition: A | Discharge status: Skilled Nursing Facility | Payer: Medicare Other | Source: Skilled Nursing Facility | Attending: Family Medicine | Admitting: Family Medicine

## 2021-06-20 DIAGNOSIS — E440 Moderate protein-calorie malnutrition: Secondary | ICD-10-CM | POA: Diagnosis present

## 2021-06-20 DIAGNOSIS — C7931 Secondary malignant neoplasm of brain: Secondary | ICD-10-CM | POA: Diagnosis not present

## 2021-06-20 DIAGNOSIS — Z79890 Hormone replacement therapy (postmenopausal): Secondary | ICD-10-CM

## 2021-06-20 DIAGNOSIS — R0602 Shortness of breath: Secondary | ICD-10-CM

## 2021-06-20 DIAGNOSIS — E785 Hyperlipidemia, unspecified: Secondary | ICD-10-CM | POA: Diagnosis present

## 2021-06-20 DIAGNOSIS — E119 Type 2 diabetes mellitus without complications: Secondary | ICD-10-CM

## 2021-06-20 DIAGNOSIS — U071 COVID-19: Secondary | ICD-10-CM | POA: Diagnosis not present

## 2021-06-20 DIAGNOSIS — Z8673 Personal history of transient ischemic attack (TIA), and cerebral infarction without residual deficits: Secondary | ICD-10-CM

## 2021-06-20 DIAGNOSIS — Z7189 Other specified counseling: Secondary | ICD-10-CM

## 2021-06-20 DIAGNOSIS — E039 Hypothyroidism, unspecified: Secondary | ICD-10-CM | POA: Diagnosis present

## 2021-06-20 DIAGNOSIS — M1990 Unspecified osteoarthritis, unspecified site: Secondary | ICD-10-CM | POA: Diagnosis present

## 2021-06-20 DIAGNOSIS — Z9221 Personal history of antineoplastic chemotherapy: Secondary | ICD-10-CM

## 2021-06-20 DIAGNOSIS — Z791 Long term (current) use of non-steroidal anti-inflammatories (NSAID): Secondary | ICD-10-CM

## 2021-06-20 DIAGNOSIS — E1165 Type 2 diabetes mellitus with hyperglycemia: Secondary | ICD-10-CM | POA: Diagnosis present

## 2021-06-20 DIAGNOSIS — Z87442 Personal history of urinary calculi: Secondary | ICD-10-CM

## 2021-06-20 DIAGNOSIS — E860 Dehydration: Secondary | ICD-10-CM | POA: Diagnosis present

## 2021-06-20 DIAGNOSIS — Z79899 Other long term (current) drug therapy: Secondary | ICD-10-CM

## 2021-06-20 DIAGNOSIS — I10 Essential (primary) hypertension: Secondary | ICD-10-CM | POA: Diagnosis present

## 2021-06-20 DIAGNOSIS — Z7982 Long term (current) use of aspirin: Secondary | ICD-10-CM

## 2021-06-20 DIAGNOSIS — F17210 Nicotine dependence, cigarettes, uncomplicated: Secondary | ICD-10-CM | POA: Diagnosis present

## 2021-06-20 DIAGNOSIS — J449 Chronic obstructive pulmonary disease, unspecified: Secondary | ICD-10-CM | POA: Diagnosis present

## 2021-06-20 DIAGNOSIS — I7121 Aneurysm of the ascending aorta, without rupture: Secondary | ICD-10-CM | POA: Diagnosis present

## 2021-06-20 DIAGNOSIS — N179 Acute kidney failure, unspecified: Secondary | ICD-10-CM | POA: Diagnosis not present

## 2021-06-20 DIAGNOSIS — R778 Other specified abnormalities of plasma proteins: Secondary | ICD-10-CM | POA: Diagnosis present

## 2021-06-20 DIAGNOSIS — R54 Age-related physical debility: Secondary | ICD-10-CM | POA: Diagnosis present

## 2021-06-20 DIAGNOSIS — I248 Other forms of acute ischemic heart disease: Secondary | ICD-10-CM | POA: Diagnosis present

## 2021-06-20 DIAGNOSIS — Z86711 Personal history of pulmonary embolism: Secondary | ICD-10-CM

## 2021-06-20 DIAGNOSIS — N401 Enlarged prostate with lower urinary tract symptoms: Secondary | ICD-10-CM | POA: Diagnosis present

## 2021-06-20 DIAGNOSIS — C3491 Malignant neoplasm of unspecified part of right bronchus or lung: Secondary | ICD-10-CM | POA: Diagnosis present

## 2021-06-20 DIAGNOSIS — R7989 Other specified abnormal findings of blood chemistry: Secondary | ICD-10-CM | POA: Diagnosis present

## 2021-06-20 DIAGNOSIS — Z6821 Body mass index (BMI) 21.0-21.9, adult: Secondary | ICD-10-CM

## 2021-06-20 DIAGNOSIS — R319 Hematuria, unspecified: Secondary | ICD-10-CM | POA: Diagnosis present

## 2021-06-20 DIAGNOSIS — Z801 Family history of malignant neoplasm of trachea, bronchus and lung: Secondary | ICD-10-CM

## 2021-06-20 DIAGNOSIS — R350 Frequency of micturition: Secondary | ICD-10-CM | POA: Diagnosis present

## 2021-06-20 DIAGNOSIS — I959 Hypotension, unspecified: Secondary | ICD-10-CM | POA: Diagnosis present

## 2021-06-20 DIAGNOSIS — Z85828 Personal history of other malignant neoplasm of skin: Secondary | ICD-10-CM

## 2021-06-20 DIAGNOSIS — Z85118 Personal history of other malignant neoplasm of bronchus and lung: Secondary | ICD-10-CM

## 2021-06-20 DIAGNOSIS — E872 Acidosis, unspecified: Secondary | ICD-10-CM | POA: Diagnosis present

## 2021-06-20 DIAGNOSIS — Z66 Do not resuscitate: Secondary | ICD-10-CM | POA: Diagnosis present

## 2021-06-20 DIAGNOSIS — J9601 Acute respiratory failure with hypoxia: Secondary | ICD-10-CM | POA: Diagnosis present

## 2021-06-20 HISTORY — DX: Acute kidney failure, unspecified: N17.9

## 2021-06-20 LAB — RESP PANEL BY RT-PCR (FLU A&B, COVID) ARPGX2
Influenza A by PCR: NEGATIVE
Influenza B by PCR: NEGATIVE
SARS Coronavirus 2 by RT PCR: POSITIVE — AB

## 2021-06-20 LAB — CBC WITH DIFFERENTIAL/PLATELET
Abs Immature Granulocytes: 0.19 10*3/uL — ABNORMAL HIGH (ref 0.00–0.07)
Basophils Absolute: 0 10*3/uL (ref 0.0–0.1)
Basophils Relative: 0 %
Eosinophils Absolute: 0 10*3/uL (ref 0.0–0.5)
Eosinophils Relative: 0 %
HCT: 36.3 % — ABNORMAL LOW (ref 39.0–52.0)
Hemoglobin: 11.8 g/dL — ABNORMAL LOW (ref 13.0–17.0)
Immature Granulocytes: 1 %
Lymphocytes Relative: 3 %
Lymphs Abs: 0.7 10*3/uL (ref 0.7–4.0)
MCH: 30 pg (ref 26.0–34.0)
MCHC: 32.5 g/dL (ref 30.0–36.0)
MCV: 92.4 fL (ref 80.0–100.0)
Monocytes Absolute: 0.6 10*3/uL (ref 0.1–1.0)
Monocytes Relative: 3 %
Neutro Abs: 21.4 10*3/uL — ABNORMAL HIGH (ref 1.7–7.7)
Neutrophils Relative %: 93 %
Platelets: 442 10*3/uL — ABNORMAL HIGH (ref 150–400)
RBC: 3.93 MIL/uL — ABNORMAL LOW (ref 4.22–5.81)
RDW: 15.5 % (ref 11.5–15.5)
WBC: 22.9 10*3/uL — ABNORMAL HIGH (ref 4.0–10.5)
nRBC: 0 % (ref 0.0–0.2)

## 2021-06-20 LAB — TRIGLYCERIDES: Triglycerides: 64 mg/dL (ref <150)

## 2021-06-20 LAB — COMPREHENSIVE METABOLIC PANEL
ALT: 27 U/L (ref 0–44)
AST: 20 U/L (ref 15–41)
Albumin: 2.4 g/dL — ABNORMAL LOW (ref 3.5–5.0)
Alkaline Phosphatase: 124 U/L (ref 38–126)
Anion gap: 9 (ref 5–15)
BUN: 23 mg/dL (ref 8–23)
CO2: 30 mmol/L (ref 22–32)
Calcium: 8.2 mg/dL — ABNORMAL LOW (ref 8.9–10.3)
Chloride: 100 mmol/L (ref 98–111)
Creatinine, Ser: 1.16 mg/dL (ref 0.61–1.24)
GFR, Estimated: >60 mL/min (ref >60)
Glucose, Bld: 242 mg/dL — ABNORMAL HIGH (ref 70–99)
Potassium: 3.3 mmol/L — ABNORMAL LOW (ref 3.5–5.1)
Sodium: 139 mmol/L (ref 135–145)
Total Bilirubin: 0.9 mg/dL (ref 0.3–1.2)
Total Protein: 6.2 g/dL — ABNORMAL LOW (ref 6.5–8.1)

## 2021-06-20 LAB — LACTIC ACID, PLASMA
Lactic Acid, Venous: 2.3 mmol/L (ref 0.5–1.9)
Lactic Acid, Venous: 2.1 mmol/L (ref 0.5–1.9)

## 2021-06-20 LAB — FIBRINOGEN: Fibrinogen: 648 mg/dL — ABNORMAL HIGH (ref 210–475)

## 2021-06-20 LAB — TROPONIN I (HIGH SENSITIVITY)
Troponin I (High Sensitivity): 520 ng/L (ref <18)
Troponin I (High Sensitivity): 611 ng/L (ref <18)
Troponin I (High Sensitivity): 630 ng/L (ref <18)

## 2021-06-20 LAB — D-DIMER, QUANTITATIVE: D-Dimer, Quant: 3.74 ug/mL-FEU — ABNORMAL HIGH (ref 0.00–0.50)

## 2021-06-20 LAB — LACTATE DEHYDROGENASE: LDH: 112 U/L (ref 98–192)

## 2021-06-20 LAB — BRAIN NATRIURETIC PEPTIDE: B Natriuretic Peptide: 266 pg/mL — ABNORMAL HIGH (ref 0.0–100.0)

## 2021-06-20 LAB — FERRITIN: Ferritin: 455 ng/mL — ABNORMAL HIGH (ref 24–336)

## 2021-06-20 LAB — PROCALCITONIN: Procalcitonin: <0.1 ng/mL

## 2021-06-20 MED ORDER — IPRATROPIUM-ALBUTEROL 20-100 MCG/ACT IN AERS
2.0000 | INHALATION_SPRAY | Freq: Three times a day (TID) | RESPIRATORY_TRACT | Status: DC
Start: 2021-06-20 — End: 2021-06-21
  Administered 2021-06-20 – 2021-06-21 (×3): 2 via RESPIRATORY_TRACT
  Filled 2021-06-20: qty 4

## 2021-06-20 MED ORDER — INSULIN ASPART 100 UNIT/ML IJ SOLN: 0.0000 [IU] | Freq: Every day | INTRAMUSCULAR | Status: DC

## 2021-06-20 MED ORDER — POTASSIUM CHLORIDE IN NACL 20-0.9 MEQ/L-% IV SOLN: INTRAVENOUS | Status: AC

## 2021-06-20 MED ORDER — SODIUM CHLORIDE 0.9 % IV BOLUS
500.0000 mL | Freq: Once | INTRAVENOUS | Status: AC
  Administered 2021-06-20: 500 mL via INTRAVENOUS

## 2021-06-20 MED ORDER — CLOPIDOGREL BISULFATE 75 MG PO TABS
75.0000 mg | ORAL_TABLET | Freq: Every day | ORAL | Status: DC
  Administered 2021-06-21 – 2021-06-24 (×4): 75 mg via ORAL
  Filled 2021-06-20 (×4): qty 1

## 2021-06-20 MED ORDER — ASCORBIC ACID 500 MG PO TABS
500.0000 mg | ORAL_TABLET | Freq: Every day | ORAL | Status: DC
  Administered 2021-06-21 – 2021-06-24 (×5): 500 mg via ORAL
  Filled 2021-06-20 (×5): qty 1

## 2021-06-20 MED ORDER — SODIUM CHLORIDE 0.9 % IV SOLN
500.0000 mg | INTRAVENOUS | Status: DC
  Administered 2021-06-20: 500 mg via INTRAVENOUS
  Filled 2021-06-20: qty 500

## 2021-06-20 MED ORDER — SODIUM CHLORIDE 0.9 % IV SOLN
2.0000 g | INTRAVENOUS | Status: DC
  Administered 2021-06-20: 2 g via INTRAVENOUS
  Filled 2021-06-20: qty 20

## 2021-06-20 MED ORDER — ONDANSETRON HCL 4 MG/2ML IJ SOLN: 4.0000 mg | Freq: Four times a day (QID) | INTRAMUSCULAR | Status: DC | PRN

## 2021-06-20 MED ORDER — NIRMATRELVIR/RITONAVIR (PAXLOVID) TABLET (RENAL DOSING)
2.0000 | ORAL_TABLET | Freq: Two times a day (BID) | ORAL | Status: DC
  Administered 2021-06-21: 2 via ORAL
  Filled 2021-06-20: qty 20

## 2021-06-20 MED ORDER — INSULIN ASPART 100 UNIT/ML IJ SOLN
0.0000 [IU] | Freq: Every day | INTRAMUSCULAR | Status: DC
Start: 2021-06-20 — End: 2021-06-24

## 2021-06-20 MED ORDER — ZINC SULFATE 220 (50 ZN) MG PO CAPS
220.0000 mg | ORAL_CAPSULE | Freq: Every day | ORAL | Status: DC
  Administered 2021-06-21 – 2021-06-24 (×5): 220 mg via ORAL
  Filled 2021-06-20 (×5): qty 1

## 2021-06-20 MED ORDER — ONDANSETRON HCL 4 MG PO TABS: 4.0000 mg | ORAL_TABLET | Freq: Four times a day (QID) | ORAL | Status: DC | PRN

## 2021-06-20 MED ORDER — ALBUTEROL SULFATE HFA 108 (90 BASE) MCG/ACT IN AERS: 2.0000 | INHALATION_SPRAY | RESPIRATORY_TRACT | Status: DC | PRN

## 2021-06-20 MED ORDER — LEVOTHYROXINE SODIUM 75 MCG PO TABS
175.0000 ug | ORAL_TABLET | Freq: Every day | ORAL | Status: DC
  Administered 2021-06-21 – 2021-06-24 (×4): 175 ug via ORAL
  Filled 2021-06-20 (×4): qty 1

## 2021-06-20 MED ORDER — LACTATED RINGERS IV SOLN: INTRAVENOUS | Status: DC

## 2021-06-20 MED ORDER — ASPIRIN EC 81 MG PO TBEC
81.0000 mg | DELAYED_RELEASE_TABLET | Freq: Two times a day (BID) | ORAL | Status: DC
  Administered 2021-06-21 – 2021-06-23 (×6): 81 mg via ORAL
  Filled 2021-06-20 (×5): qty 1

## 2021-06-20 MED ORDER — IPRATROPIUM-ALBUTEROL 0.5-2.5 (3) MG/3ML IN SOLN: 3.0000 mL | RESPIRATORY_TRACT | Status: DC | PRN

## 2021-06-20 MED ORDER — INSULIN ASPART 100 UNIT/ML IJ SOLN: 0.0000 [IU] | Freq: Three times a day (TID) | INTRAMUSCULAR | Status: DC

## 2021-06-20 MED ORDER — POLYETHYLENE GLYCOL 3350 17 G PO PACK
17.0000 g | PACK | Freq: Every day | ORAL | Status: DC | PRN
  Filled 2021-06-20: qty 1

## 2021-06-20 MED ORDER — ROSUVASTATIN CALCIUM 20 MG PO TABS: 20.0000 mg | ORAL_TABLET | Freq: Every evening | ORAL | Status: DC

## 2021-06-20 MED ORDER — INSULIN ASPART 100 UNIT/ML IJ SOLN
0.0000 [IU] | Freq: Three times a day (TID) | INTRAMUSCULAR | Status: DC
  Administered 2021-06-21 – 2021-06-22 (×2): 1 [IU] via SUBCUTANEOUS
  Administered 2021-06-22: 3 [IU] via SUBCUTANEOUS
  Administered 2021-06-23: 18:00:00 2 [IU] via SUBCUTANEOUS
  Administered 2021-06-23: 12:00:00 1 [IU] via SUBCUTANEOUS

## 2021-06-20 MED ORDER — ENOXAPARIN SODIUM 40 MG/0.4ML IJ SOSY
40.0000 mg | PREFILLED_SYRINGE | INTRAMUSCULAR | Status: DC
  Administered 2021-06-21 – 2021-06-23 (×4): 40 mg via SUBCUTANEOUS
  Filled 2021-06-20 (×4): qty 0.4

## 2021-06-20 NOTE — ED Provider Notes (Signed)
Emergency Department Provider Note   I have reviewed the triage vital signs and the nursing notes.   HISTORY  Chief Complaint No chief complaint on file.   HPI [name] is a 75 y.o. male with past medical history reviewed below including COPD, diabetes, hypertension, not on home oxygen, presents to the emergency department from a nursing facility with low oxygen saturation noticed there.  Patient denies feeling particularly short of breath.  Patient tells me is not exactly sure why they sent him and but unclear if he may be dealing with some underlying confusion versus more chronic mental confusion. Level 5 caveat applies: AMS.    Past Medical History:  Diagnosis Date   Actinic keratosis    AKI (acute kidney injury) ([place]) 06/20/2021   Arthritis    Colitis MAY 2012    CT ABD/PELVIS HEP FLEXURE   COPD (chronic obstructive pulmonary disease) (HCC)    Diabetes mellitus without complication ([place])    History of kidney stones    Hyperlipemia    Hypertension    NSAID Japheth Diekman-term use NAPROXEN FOR OA   SCL Ca dx'd 08/2018    Patient Active Problem List   Diagnosis Date Noted   COVID-19 virus infection 06/20/2021   Elevated troponin 06/20/2021   AKI (acute kidney injury) ([place]) 06/20/2021   Benign prostatic hyperplasia without lower urinary tract symptoms 04/05/2021   Deficiency of other specified B group vitamins 04/05/2021   Vitamin D deficiency 04/05/2021   Malignant neoplasm of unspecified part of unspecified bronchus or lung ([place]) 04/05/2021   History of falling 04/05/2021   Prsnl hx of TIA (TIA), and cereb infrc w/o resid deficits 04/05/2021   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   Closed fracture of neck of right femur ([place])    Primary hypertension    Physical deconditioning    Gastroesophageal reflux disease    Benign prostatic hyperplasia with urinary frequency    Hypothyroidism    Hyperlipidemia    Hip fracture ([place]) 02/08/2021   Stroke  (cerebrum) ([place]) 11/10/2020   Weak 09/24/2020   Ambulatory dysfunction 09/24/2020   Alin Hutchins term (current) use of aspirin 08/07/2020   Other pulmonary embolism without acute cor pulmonale ([place]) 01/19/2020   Chronic obstructive pulmonary disease ([place]) 01/19/2020   Type 2 diabetes mellitus without complication ([place]) 30/86/5784   Goals of care, counseling/discussion 09/02/2018   Brain metastasis ([place]) 08/29/2018   Small cell carcinoma of lung, right ([place])    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   DNR (do not resuscitate) discussion 08/22/2018   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   Thoracic ascending aortic aneurysm 08/21/2018   Lymphadenopathy 08/21/2018   Medial meniscus tear    Primary osteoarthritis of knee    Synovial plica of knee    Colitis 01/25/2011   Colon cancer screening 01/25/2011    Past Surgical History:  Procedure Laterality Date   BACK SURGERY     spinal stenosis   BASAL CELL CARCINOMA EXCISION  FACE, arms feet, leg   CHOLECYSTECTOMY  JUNE 2011 MJ   STONES, PANCREATITIS   HIP PINNING,CANNULATED Right 02/10/2021   Procedure: CANNULATED HIP PINNING;  Surgeon: Mordecai Rasmussen, MD;  Location: [facility];  Service: Orthopedics;  Laterality: Right;  CRPP of right femoral neck fracture   KNEE ARTHROSCOPY WITH MEDIAL MENISECTOMY Right 03/24/2015   Procedure: KNEE ARTHROSCOPY WITH MEDIAL MENISECTOMY;  Surgeon: Carole Civil, MD;  Location: [facility];  Service: Orthopedics;  Laterality: Right;  KNEE SURGERY Left LEFT   arthroscopy   LITHOTRIPSY  80s   PORTACATH PLACEMENT Left 09/11/2018   Procedure: INSERTION PORT-A-CATH;  Surgeon: Virl Cagey, MD;  Location: [facility];  Service: General;  Laterality: Left;   SPINE SURGERY      Allergies Patient has no known allergies.  Family History  Problem Relation Age of Onset   Cancer Mother        lung   Cancer Father        lung and liver   Colon polyps Neg Hx    Colon cancer Neg Hx      Social History Social History   Tobacco Use   Smoking status: Some Days    Packs/day: 1.00    Years: 50.00    Pack years: 50.00    Types: Cigarettes    Last attempt to quit: 08/07/2018    Years since quitting: 2.8   Smokeless tobacco: Never  Vaping Use   Vaping Use: Never used  Substance Use Topics   Alcohol use: Not Currently    Comment: once a month   Drug use: No    Review of Systems  Constitutional: No fever/chills Eyes: No visual changes. ENT: No sore throat. Cardiovascular: Denies chest pain. Respiratory: Denies shortness of breath. Low O2 per facility.  Gastrointestinal: No abdominal pain.  No nausea, no vomiting.  No diarrhea.  No constipation. Genitourinary: Negative for dysuria. Musculoskeletal: Negative for back pain. Skin: Negative for rash. Neurological: Negative for headaches, focal weakness or numbness.  10-point ROS otherwise negative.  ____________________________________________   PHYSICAL EXAM:  VITAL SIGNS: ED Triage Vitals  Enc Vitals Group     BP 06/20/21 1523 110/79     Pulse Rate 06/20/21 1523 91     Resp 06/20/21 1523 (!) 23     Temp 06/20/21 1523 98.3 F (36.8 C)     Temp Source 06/20/21 1523 Oral     SpO2 06/20/21 1523 91 %     Weight 06/20/21 1513 162 lb 0.6 oz (73.5 kg)     Height 06/20/21 1513 5\' 11"  (1.803 m)   Constitutional: Alert and answering questions. Some mental slowness noted. No acute distress.  Eyes: Conjunctivae are normal. PERRL (44mm) Head: Atraumatic. Nose: No congestion/rhinnorhea. Mouth/Throat: Mucous membranes are moist.  Neck: No stridor.   Cardiovascular: Normal rate, regular rhythm. Good peripheral circulation. Grossly normal heart sounds.   Respiratory: Slight increased respiratory effort.  No retractions. Lungs with rhonchi throughout. No wheezing or rales.  Gastrointestinal: Soft and nontender. No distention.  Musculoskeletal: No lower extremity tenderness nor edema. No gross deformities of  extremities. Neurologic:  Normal speech and language. No gross focal neurologic deficits are appreciated.  Skin:  Skin is warm, dry and intact. No rash noted.  ____________________________________________   LABS (all labs ordered are listed, but only abnormal results are displayed)  Labs Reviewed  RESP PANEL BY RT-PCR (FLU A&B, COVID) ARPGX2 - Abnormal; Notable for the following components:      Result Value   SARS Coronavirus 2 by RT PCR POSITIVE (*)    All other components within normal limits  COMPREHENSIVE METABOLIC PANEL - Abnormal; Notable for the following components:   Potassium 3.3 (*)    Glucose, Bld 242 (*)    Calcium 8.2 (*)    Total Protein 6.2 (*)    Albumin 2.4 (*)    All other components within normal limits  BRAIN NATRIURETIC PEPTIDE - Abnormal; Notable for the following components:   B  Natriuretic Peptide 266.0 (*)    All other components within normal limits  CBC WITH DIFFERENTIAL/PLATELET - Abnormal; Notable for the following components:   WBC 22.9 (*)    RBC 3.93 (*)    Hemoglobin 11.8 (*)    HCT 36.3 (*)    Platelets 442 (*)    Neutro Abs 21.4 (*)    Abs Immature Granulocytes 0.19 (*)    All other components within normal limits  LACTIC ACID, PLASMA - Abnormal; Notable for the following components:   Lactic Acid, Venous 2.3 (*)    All other components within normal limits  LACTIC ACID, PLASMA - Abnormal; Notable for the following components:   Lactic Acid, Venous 2.1 (*)    All other components within normal limits  D-DIMER, QUANTITATIVE - Abnormal; Notable for the following components:   D-Dimer, Quant 3.74 (*)    All other components within normal limits  FIBRINOGEN - Abnormal; Notable for the following components:   Fibrinogen 648 (*)    All other components within normal limits  TROPONIN I (HIGH SENSITIVITY) - Abnormal; Notable for the following components:   Troponin I (High Sensitivity) 520 (*)    All other components within normal limits   TROPONIN I (HIGH SENSITIVITY) - Abnormal; Notable for the following components:   Troponin I (High Sensitivity) 611 (*)    All other components within normal limits  TROPONIN I (HIGH SENSITIVITY) - Abnormal; Notable for the following components:   Troponin I (High Sensitivity) 630 (*)    All other components within normal limits  CULTURE, BLOOD (ROUTINE X 2)  CULTURE, BLOOD (ROUTINE X 2)  PROCALCITONIN  LACTATE DEHYDROGENASE  TRIGLYCERIDES  FERRITIN  C-REACTIVE PROTEIN  CBC WITH DIFFERENTIAL/PLATELET  COMPREHENSIVE METABOLIC PANEL  C-REACTIVE PROTEIN  D-DIMER, QUANTITATIVE  FERRITIN  MAGNESIUM  PHOSPHORUS   ____________________________________________  EKG   EKG Interpretation  Date/Time:  Monday June 20 2021 15:30:13 EST Ventricular Rate:  86 PR Interval:  170 QRS Duration: 104 QT Interval:  405 QTC Calculation: 485 R Axis:   -35 Text Interpretation: Sinus rhythm Ventricular premature complex Left axis deviation Borderline prolonged QT interval Confirmed by Nanda Quinton [phone]) on 06/20/2021 3:50:42 PM        ____________________________________________  RADIOLOGY  DG Chest Port 1 View  Result Date: 06/20/2021 CLINICAL DATA:  Hypoxia. EXAM: PORTABLE CHEST 1 VIEW COMPARISON:  04/25/2021 FINDINGS: Patient has LEFT-sided Port-A-Cath, tip overlying the level of superior vena cava. Low lung volumes. Heart size is enlarged but probably stable. Prominent hilar regions bilaterally, stable in appearance. There is streaky opacity in the LATERAL RIGHT mid lung zone and RIGHT perihilar region consistent with atelectasis. No evidence for pulmonary edema. No consolidations. IMPRESSION: 1. Low lung volumes. 2. Atelectasis in the LATERAL RIGHT mid lung zone and RIGHT perihilar region. Electronically Signed   By: Nolon Nations M.D.   On: 06/20/2021 15:49    ____________________________________________   PROCEDURES  Procedure(s) performed:   Procedures  CRITICAL  CARE Performed by: Margette Fast Total critical care time: 35 minutes Critical care time was exclusive of separately billable procedures and treating other patients. Critical care was necessary to treat or prevent imminent or life-threatening deterioration. Critical care was time spent personally by me on the following activities: development of treatment plan with patient and/or surrogate as well as nursing, discussions with consultants, evaluation of patient's response to treatment, examination of patient, obtaining history from patient or surrogate, ordering and performing treatments and interventions, ordering and review of laboratory  studies, ordering and review of radiographic studies, pulse oximetry and re-evaluation of patient's condition.  Nanda Quinton, MD Emergency Medicine  ____________________________________________   INITIAL IMPRESSION / ASSESSMENT AND PLAN / ED COURSE  Pertinent labs & imaging results that were available during my care of the patient were reviewed by me and considered in my medical decision making (see chart for details).   Patient presents to the emergency department with hypoxemia found at his nursing facility.  He arrives on 2 L nasal cannula which is new for him.  He does have a COPD history although does not sound wheezy.  He has diffuse rhonchi on exam.  BP on arrival is 104/83 with respirations just over 20.  No acute distress.  No focal neurologic deficits.  Differential includes pneumonia, COVID, flu, CHF, ACS, and PE.   04:31 PM  Patient's lab work coming back showing leukocytosis of 22.9.  He is afebrile but will start gentle IV fluid bolus along with antibiotics given his rhonchi on exam and hypoxemia.  Lower suspicion for PE clinically although could be a consideration.  His troponin is 520.  No findings on EKG did suggest acute ischemic change.  BNP also slightly elevated at 266 although does not have other findings on exam to strongly suspect acute  CHF. Will trend troponin but suspect demand ischemia.   COVID positive. Have added inflammatory markers. Will continue abx for now with significant leukocytosis and elevated lactate. Ordered CTA initially with symptoms and elevated troponin but with COVID being positive likely explains symptoms.   Discussed patient's case with TR[facility] to request admission. Patient and family (if present) updated with plan. Care transferred to [facility] service. Discussed CTA. Will hold on CTA for now and TRH will follow d-dimer and decide on scanning at that time.   I reviewed all nursing notes, vitals, pertinent old records, EKGs, labs, imaging (as available).  ____________________________________________  FINAL CLINICAL IMPRESSION(S) / ED DIAGNOSES  Final diagnoses:  Acute respiratory failure with hypoxia ([place])  COVID-19     MEDICATIONS GIVEN DURING THIS VISIT:  Medications  aspirin EC tablet 81 mg (has no administration in time range)  enoxaparin (LOVENOX) injection 40 mg (has no administration in time range)  ascorbic acid (VITAMIN C) tablet 500 mg (has no administration in time range)  zinc sulfate capsule 220 mg (has no administration in time range)  ondansetron (ZOFRAN) tablet 4 mg (has no administration in time range)    Or  ondansetron (ZOFRAN) injection 4 mg (has no administration in time range)  polyethylene glycol (MIRALAX / GLYCOLAX) packet 17 g (has no administration in time range)  clopidogrel (PLAVIX) tablet 75 mg (has no administration in time range)  Ipratropium-Albuterol (COMBIVENT) respimat 2 puff (2 puffs Inhalation Given 06/20/21 2215)  levothyroxine (SYNTHROID) tablet 175 mcg (has no administration in time range)  insulin aspart (novoLOG) injection 0-5 Units (has no administration in time range)  insulin aspart (novoLOG) injection 0-9 Units (has no administration in time range)  albuterol (VENTOLIN HFA) 108 (90 Base) MCG/ACT inhaler 2 puff (has no administration in time range)  0.9 %  NaCl with KCl 20 mEq/ L  infusion (has no administration in time range)  nirmatrelvir/ritonavir EUA (renal dosing) (PAXLOVID) 2 tablet (has no administration in time range)  sodium chloride 0.9 % bolus 500 mL (0 mLs Intravenous Stopped 06/20/21 1723)  sodium chloride 0.9 % bolus 500 mL (0 mLs Intravenous Stopped 06/20/21 1930)     Note:  This document was prepared using Dragon voice  recognition software and may include unintentional dictation errors.  Nanda Quinton, MD, [facility] Emergency Medicine    Jullian Previti, [facility], MD 06/20/21 [phone]

## 2021-06-20 NOTE — Progress Notes (Signed)
Took patient Recruitment consultant.  Explained usage for both and the amount to do.  Patient was able to reach 561ml on IS X10 and did flutter X10 both with good patient effort.  Both left at bedside for patient.

## 2021-06-20 NOTE — H&P (Addendum)
History and Physical    [name] BVQ:[id number] DOB: 1946/06/06 DOA: 06/20/2021  PCP: Pcp, No   Patient coming from: [name]  I have personally briefly reviewed patient's old medical records in [place]  Chief Complaint: Low O2 sats  HPI: [name] is a 75 y.o. male with medical history significant for COPD, pulmonary embolism, small cell lung cancer, diabetes mellitus. Sent to the ED from [facility] by EMS for reports of low O2 sats.  Per EMS O2 sats was 88 to 90% on room air when EMS arrived.  Nursing home notes at bedside reports sats of 81% on room air. On my evaluation patient is awake alert oriented x 4 and answering questions appropriately.  He denies difficulty breathing.  He reports nasal congestion and coughing.  He denies chest pain at this time.  Denies abdominal pain.  Denies pain with urination.  He denies nausea or vomiting.  ED Course: Temperature 98.3.  Heart rate 54-91.  Respiratory rate 14-28.  Blood pressure systolic dropped to 80/03, improved with IV fluids currently 101/69.  O2 sats at a time of my evaluation ranging from 90 to 96% on room air. WBC 22.9.  Troponin 520.  BNP 266.  Creatinine 1.16.  Potassium 3.3.  Lactic acidosis of 2.3 > 2.1 after 1 L bolus.  Chest x-ray suggesting atelectasis. With leukocytosis, IV ceftriaxone and azithromycin was started for possible pneumonia.  Prior to return of positive COVID test.  Review of Systems: As per HPI all other systems reviewed and negative.  Past Medical History:  Diagnosis Date   Actinic keratosis    Arthritis    Colitis MAY 2012    CT ABD/PELVIS HEP FLEXURE   COPD (chronic obstructive pulmonary disease) (HCC)    Diabetes mellitus without complication ([place])    History of kidney stones    Hyperlipemia    Hypertension    NSAID long-term use NAPROXEN FOR OA   SCL Ca dx'd 08/2018    Past Surgical History:  Procedure Laterality Date   BACK SURGERY     spinal stenosis   BASAL CELL  CARCINOMA EXCISION  FACE, arms feet, leg   CHOLECYSTECTOMY  JUNE 2011 MJ   STONES, PANCREATITIS   HIP PINNING,CANNULATED Right 02/10/2021   Procedure: CANNULATED HIP PINNING;  Surgeon: Mordecai Rasmussen, MD;  Location: [facility];  Service: Orthopedics;  Laterality: Right;  CRPP of right femoral neck fracture   KNEE ARTHROSCOPY WITH MEDIAL MENISECTOMY Right 03/24/2015   Procedure: KNEE ARTHROSCOPY WITH MEDIAL MENISECTOMY;  Surgeon: Carole Civil, MD;  Location: [facility];  Service: Orthopedics;  Laterality: Right;   KNEE SURGERY Left LEFT   arthroscopy   LITHOTRIPSY  80s   PORTACATH PLACEMENT Left 09/11/2018   Procedure: INSERTION PORT-A-CATH;  Surgeon: Virl Cagey, MD;  Location: [facility];  Service: General;  Laterality: Left;   SPINE SURGERY       reports that he has been smoking cigarettes. He has a 50.00 pack-year smoking history. He has never used smokeless tobacco. He reports that he does not currently use alcohol. He reports that he does not use drugs.  No Known Allergies  Family History  Problem Relation Age of Onset   Cancer Mother        lung   Cancer Father        lung and liver   Colon polyps Neg Hx    Colon cancer Neg Hx     Prior to Admission medications  Medication Sig Start Date End Date Taking? Authorizing Provider  acetaminophen (TYLENOL) 325 MG tablet Take 2 tablets (650 mg total) by mouth every 6 (six) hours as needed for mild pain (or Fever >/= 101). 02/14/21   Barton Dubois, MD  albuterol (PROVENTIL HFA;VENTOLIN HFA) 108 (90 Base) MCG/ACT inhaler Inhale 2 puffs into the lungs every 6 (six) hours as needed for wheezing or shortness of breath. 06/22/18   [provider]  aspirin EC 81 MG EC tablet Take 1 tablet (81 mg total) by mouth 2 (two) times daily. Swallow whole. 02/14/21   Barton Dubois, MD  Cholecalciferol 50 MCG (2000 UT) TBDP Take 1 tablet by mouth daily.    [provider]  docusate sodium (COLACE) 100 MG capsule Take 1 capsule (100 mg  total) by mouth 2 (two) times daily as needed for mild constipation. 02/14/21   Barton Dubois, MD  levothyroxine (SYNTHROID) 150 MCG tablet Take 150 mcg by mouth daily. 11/10/20   [provider]  Multiple Vitamin (MULTIVITAMIN WITH MINERALS) TABS tablet Take 1 tablet by mouth daily. 09/26/20   Johnson, Clanford L, MD  oxyCODONE (OXY IR/ROXICODONE) 5 MG immediate release tablet Take 1 tablet (5 mg total) by mouth every 6 (six) hours as needed for severe pain. Patient not taking: Reported on 04/19/2021 02/14/21   Barton Dubois, MD  pantoprazole (PROTONIX) 40 MG tablet Take 1 tablet (40 mg total) by mouth daily. 02/15/21   Barton Dubois, MD  polyethylene glycol (MIRALAX / GLYCOLAX) 17 g packet Take 17 g by mouth daily as needed for mild constipation. 02/14/21   Barton Dubois, MD  rosuvastatin (CRESTOR) 20 MG tablet Take 1 tablet (20 mg total) by mouth daily. 11/11/20   Ghimire, Henreitta Leber, MD  tamsulosin (FLOMAX) 0.4 MG CAPS capsule Take 1 capsule (0.4 mg total) by mouth daily after supper. 02/14/21   Barton Dubois, MD  thiamine 100 MG tablet Take 1 tablet (100 mg total) by mouth daily. 09/26/20   Johnson, Clanford L, MD  vitamin B-12 1000 MCG tablet Take 1 tablet (1,000 mcg total) by mouth daily. 11/12/20   Jonetta Osgood, MD    Physical Exam: Vitals:   06/20/21 1630 06/20/21 1700 06/20/21 1730 06/20/21 1732  BP: 102/81 (!) 116/95 (!) 76/64 93/74  Pulse: 84 75 76 75  Resp: 20 (!) 26 (!) 24 (!) 21  Temp:      TempSrc:      SpO2: 100% 98% 97% 99%  Weight:      Height:        Constitutional: Chronically ill-appearing, calm, comfortable Vitals:   06/20/21 1630 06/20/21 1700 06/20/21 1730 06/20/21 1732  BP: 102/81 (!) 116/95 (!) 76/64 93/74  Pulse: 84 75 76 75  Resp: 20 (!) 26 (!) 24 (!) 21  Temp:      TempSrc:      SpO2: 100% 98% 97% 99%  Weight:      Height:       Eyes: Pupils equal, lids and conjunctivae normal ENMT: Mucous membranes are mildly dry Neck: normal, supple, no  masses, no thyromegaly Respiratory: Patient sounds very congested, with upper airway transmitted sounds, no rhonchi or wheezing appreciated.  Normal respiratory effort.  No accessory muscle use. Cardiovascular: Regular rate and rhythm, no murmurs / rubs / gallops. No extremity edema. 2+ pedal pulses.  Port - left upper chest, no surrounding erythema or redness. Abdomen: no tenderness, no masses palpated. No hepatosplenomegaly. Bowel sounds positive.  Musculoskeletal: no clubbing / cyanosis. No  joint deformity upper and lower extremities.   Skin: no rashes, lesions, ulcers. No induration Neurologic: No apparent cranial nerve abnormality, moving all extremities spontaneously. Psychiatric: Normal judgment and insight. Alert and oriented x 3. Normal mood.   Labs on Admission: I have personally reviewed following labs and imaging studies  CBC: Recent Labs  Lab 06/20/21 1537  WBC 22.9*  NEUTROABS 21.4*  HGB 11.8*  HCT 36.3*  MCV 92.4  PLT 329*   Basic Metabolic Panel: Recent Labs  Lab 06/20/21 1537  NA 139  K 3.3*  CL 100  CO2 30  GLUCOSE 242*  BUN 23  CREATININE 1.16  CALCIUM 8.2*   Liver Function Tests: Recent Labs  Lab 06/20/21 1537  AST 20  ALT 27  ALKPHOS 124  BILITOT 0.9  PROT 6.2*  ALBUMIN 2.4*    Radiological Exams on Admission: DG Chest Port 1 View  Result Date: 06/20/2021 CLINICAL DATA:  Hypoxia. EXAM: PORTABLE CHEST 1 VIEW COMPARISON:  04/25/2021 FINDINGS: Patient has LEFT-sided Port-A-Cath, tip overlying the level of superior vena cava. Low lung volumes. Heart size is enlarged but probably stable. Prominent hilar regions bilaterally, stable in appearance. There is streaky opacity in the LATERAL RIGHT mid lung zone and RIGHT perihilar region consistent with atelectasis. No evidence for pulmonary edema. No consolidations. IMPRESSION: 1. Low lung volumes. 2. Atelectasis in the LATERAL RIGHT mid lung zone and RIGHT perihilar region. Electronically Signed   By:  Nolon Nations M.D.   On: 06/20/2021 15:49    EKG: Independently reviewed.  Sinus rhythm, rate 86.  QTc 485.  PVCs present.  No significant ST or T wave changes compared to prior.  Assessment/Plan Principal Problem:   COVID-19 virus infection Active Problems:   DNR (do not resuscitate) discussion   Small cell carcinoma of lung, right ([place])   Brain metastasis ([place])   Chronic obstructive pulmonary disease ([place])   Type 2 diabetes mellitus without complication ([place])   Primary hypertension   Hypothyroidism   Elevated troponin   AKI (acute kidney injury) ([place])   COVID-19 infection -congestion and cough.  No dyspnea, no chest pain.  Chest x-ray clear.  Reported hypoxia at nursing home, patient is currently on room air with sats ranging from 96 to 100%.  Leukocytosis of 22.9 lactic acidosis of 2.3 > 2.1 .  No focus of infection identified at this time.  Procalcitonin less than 0.1.  D-dimer is 3.74, but patient is not hypoxic, not tachycardic, and not dyspneic with history of lung cancer. -Paxlovid, pharmacy to dose -Trend inflammatory markers. -Trend WBC -Lactic acidosis likely from dehydration, hypotension. Trend -DuoNebs, mucolytic's -Ceftriaxone and azithromycin given in ED, hold off on further antibiotics for now  Acute kidney injury-creatinine 1.16, baseline 0.6.  Creatinine almost doubled, but GFR appears preserved.  Likely pre-renal from dehydration, blood pressure down to 76 systolic. -1 L bolus given, continue N/s + 20 Kcl 100cc/hr x  20 hrs  Elevated troponin- 520.  Likely demand ischemia from COVID-19 infection, and hypotension.  EKG unchanged.  No known CAD history. - Recent echo 04/2021, EF 55 to 60% no regional wall motion abnormality, with moderate to severe aortic sclerosis and moderate aortic regurgitation. -Trend troponin for now -Resume aspirin, he is also on Plavix , resume - Statins held for now, for paxlovid  Hypertension-hypotensive in ED. -Hold  telmisartan  Diabetes mellitus-random glucose 242.  Not on medication. - SSI-S  Small cell lung cancer with metastatic brain cancer-following with palliative care.  MRI brain 12/2020,  previously seen area of enhancement in right temporal lobe not present.  Per notes 10/2020, patient is status post chemotherapy, no clinical signs or symptoms of recurrence.  COPD-stable. - DuoNebs as needed  CVA hx -Resume aspirin, Plavix  DVT prophylaxis: Lovenox Code Status: DNR-Universal DNR form at bedside. Family Communication: None at bedside. Disposition Plan:  ~ 2days Consults called: None Admission status:  Obs tele  Bethena Roys MD [facility]  06/20/2021, 9:22 PM

## 2021-06-20 NOTE — Progress Notes (Addendum)
Pharmacy:  75 yr old male, COVID+ and to begin Paxlovid.  Creatinine 1.16, up from baseline ~0.6.  Crcrl ~53. On Crestor 20 mg daily PTA, last dose 11/13.  Increased risk of rhabdomyolysis if used concurrently with Paxlovid. Okay to begin Paxlovid if last statin dose > 12 hrs ago.  Plan:  Reduced dose of Nirmatrelvir 150 mg (1 tablet) and Ritonavir 100 mg PO BID x 5 days.  If renal function improves and crcl >60 ml/ml, will need to adjust Nirmatrelvir dose to 300 mg (2 tablets) PO BID.  Hold Crestor while on Paxlovid.  Arty Baumgartner, [place] 06/20/2021 9:43 PM

## 2021-06-20 NOTE — ED Triage Notes (Signed)
Pt brought in by [facility] from [facility] with c/o hypoxia. Pt's O2 sat 88-90% on RA when EMS arrived. Hx of COPD. EMS reports congestion.

## 2021-06-21 DIAGNOSIS — Z86711 Personal history of pulmonary embolism: Secondary | ICD-10-CM | POA: Diagnosis not present

## 2021-06-21 DIAGNOSIS — Z7189 Other specified counseling: Secondary | ICD-10-CM

## 2021-06-21 DIAGNOSIS — Z85828 Personal history of other malignant neoplasm of skin: Secondary | ICD-10-CM | POA: Diagnosis not present

## 2021-06-21 DIAGNOSIS — R0602 Shortness of breath: Secondary | ICD-10-CM | POA: Diagnosis present

## 2021-06-21 DIAGNOSIS — R778 Other specified abnormalities of plasma proteins: Secondary | ICD-10-CM

## 2021-06-21 DIAGNOSIS — Z6821 Body mass index (BMI) 21.0-21.9, adult: Secondary | ICD-10-CM | POA: Diagnosis not present

## 2021-06-21 DIAGNOSIS — N179 Acute kidney failure, unspecified: Secondary | ICD-10-CM | POA: Diagnosis present

## 2021-06-21 DIAGNOSIS — U071 COVID-19: Secondary | ICD-10-CM | POA: Diagnosis present

## 2021-06-21 DIAGNOSIS — I7121 Aneurysm of the ascending aorta, without rupture: Secondary | ICD-10-CM | POA: Diagnosis present

## 2021-06-21 DIAGNOSIS — E039 Hypothyroidism, unspecified: Secondary | ICD-10-CM

## 2021-06-21 DIAGNOSIS — E440 Moderate protein-calorie malnutrition: Secondary | ICD-10-CM | POA: Insufficient documentation

## 2021-06-21 DIAGNOSIS — Z8673 Personal history of transient ischemic attack (TIA), and cerebral infarction without residual deficits: Secondary | ICD-10-CM | POA: Diagnosis not present

## 2021-06-21 DIAGNOSIS — I959 Hypotension, unspecified: Secondary | ICD-10-CM | POA: Diagnosis present

## 2021-06-21 DIAGNOSIS — E119 Type 2 diabetes mellitus without complications: Secondary | ICD-10-CM

## 2021-06-21 DIAGNOSIS — E1165 Type 2 diabetes mellitus with hyperglycemia: Secondary | ICD-10-CM | POA: Diagnosis present

## 2021-06-21 DIAGNOSIS — J449 Chronic obstructive pulmonary disease, unspecified: Secondary | ICD-10-CM | POA: Diagnosis present

## 2021-06-21 DIAGNOSIS — I10 Essential (primary) hypertension: Secondary | ICD-10-CM | POA: Diagnosis present

## 2021-06-21 DIAGNOSIS — R350 Frequency of micturition: Secondary | ICD-10-CM | POA: Diagnosis present

## 2021-06-21 DIAGNOSIS — Z66 Do not resuscitate: Secondary | ICD-10-CM | POA: Diagnosis present

## 2021-06-21 DIAGNOSIS — Z87442 Personal history of urinary calculi: Secondary | ICD-10-CM | POA: Diagnosis not present

## 2021-06-21 DIAGNOSIS — E860 Dehydration: Secondary | ICD-10-CM | POA: Diagnosis present

## 2021-06-21 DIAGNOSIS — N401 Enlarged prostate with lower urinary tract symptoms: Secondary | ICD-10-CM | POA: Diagnosis present

## 2021-06-21 DIAGNOSIS — I248 Other forms of acute ischemic heart disease: Secondary | ICD-10-CM | POA: Diagnosis present

## 2021-06-21 DIAGNOSIS — E785 Hyperlipidemia, unspecified: Secondary | ICD-10-CM | POA: Diagnosis present

## 2021-06-21 DIAGNOSIS — Z85118 Personal history of other malignant neoplasm of bronchus and lung: Secondary | ICD-10-CM | POA: Diagnosis not present

## 2021-06-21 DIAGNOSIS — Z801 Family history of malignant neoplasm of trachea, bronchus and lung: Secondary | ICD-10-CM | POA: Diagnosis not present

## 2021-06-21 DIAGNOSIS — J9601 Acute respiratory failure with hypoxia: Secondary | ICD-10-CM | POA: Diagnosis present

## 2021-06-21 DIAGNOSIS — E872 Acidosis, unspecified: Secondary | ICD-10-CM | POA: Diagnosis present

## 2021-06-21 LAB — GLUCOSE, CAPILLARY
Glucose-Capillary: 113 mg/dL — ABNORMAL HIGH (ref 70–99)
Glucose-Capillary: 132 mg/dL — ABNORMAL HIGH (ref 70–99)
Glucose-Capillary: 158 mg/dL — ABNORMAL HIGH (ref 70–99)
Glucose-Capillary: 91 mg/dL (ref 70–99)
Glucose-Capillary: 97 mg/dL (ref 70–99)

## 2021-06-21 LAB — COMPREHENSIVE METABOLIC PANEL
ALT: 23 U/L (ref 0–44)
AST: 18 U/L (ref 15–41)
Albumin: 2.3 g/dL — ABNORMAL LOW (ref 3.5–5.0)
Alkaline Phosphatase: 107 U/L (ref 38–126)
Anion gap: 8 (ref 5–15)
BUN: 22 mg/dL (ref 8–23)
CO2: 30 mmol/L (ref 22–32)
Calcium: 8 mg/dL — ABNORMAL LOW (ref 8.9–10.3)
Chloride: 102 mmol/L (ref 98–111)
Creatinine, Ser: 0.86 mg/dL (ref 0.61–1.24)
GFR, Estimated: >60 mL/min (ref >60)
Glucose, Bld: 111 mg/dL — ABNORMAL HIGH (ref 70–99)
Potassium: 2.7 mmol/L — CL (ref 3.5–5.1)
Sodium: 140 mmol/L (ref 135–145)
Total Bilirubin: 0.9 mg/dL (ref 0.3–1.2)
Total Protein: 5.7 g/dL — ABNORMAL LOW (ref 6.5–8.1)

## 2021-06-21 LAB — CBC WITH DIFFERENTIAL/PLATELET
Abs Immature Granulocytes: 0.14 10*3/uL — ABNORMAL HIGH (ref 0.00–0.07)
Basophils Absolute: 0 10*3/uL (ref 0.0–0.1)
Basophils Relative: 0 %
Eosinophils Absolute: 0.1 10*3/uL (ref 0.0–0.5)
Eosinophils Relative: 0 %
HCT: 32.2 % — ABNORMAL LOW (ref 39.0–52.0)
Hemoglobin: 10.4 g/dL — ABNORMAL LOW (ref 13.0–17.0)
Immature Granulocytes: 1 %
Lymphocytes Relative: 6 %
Lymphs Abs: 1 10*3/uL (ref 0.7–4.0)
MCH: 29.5 pg (ref 26.0–34.0)
MCHC: 32.3 g/dL (ref 30.0–36.0)
MCV: 91.5 fL (ref 80.0–100.0)
Monocytes Absolute: 0.8 10*3/uL (ref 0.1–1.0)
Monocytes Relative: 5 %
Neutro Abs: 14 10*3/uL — ABNORMAL HIGH (ref 1.7–7.7)
Neutrophils Relative %: 88 %
Platelets: 377 10*3/uL (ref 150–400)
RBC: 3.52 MIL/uL — ABNORMAL LOW (ref 4.22–5.81)
RDW: 15.5 % (ref 11.5–15.5)
WBC: 16.1 10*3/uL — ABNORMAL HIGH (ref 4.0–10.5)
nRBC: 0 % (ref 0.0–0.2)

## 2021-06-21 LAB — C-REACTIVE PROTEIN
CRP: 17.2 mg/dL — ABNORMAL HIGH (ref <1.0)
CRP: 16.7 mg/dL — ABNORMAL HIGH (ref <1.0)

## 2021-06-21 LAB — PHOSPHORUS: Phosphorus: 2.6 mg/dL (ref 2.5–4.6)

## 2021-06-21 LAB — FERRITIN: Ferritin: 436 ng/mL — ABNORMAL HIGH (ref 24–336)

## 2021-06-21 LAB — D-DIMER, QUANTITATIVE: D-Dimer, Quant: 2.98 ug/mL-FEU — ABNORMAL HIGH (ref 0.00–0.50)

## 2021-06-21 LAB — MAGNESIUM: Magnesium: 2 mg/dL (ref 1.7–2.4)

## 2021-06-21 MED ORDER — POTASSIUM CHLORIDE CRYS ER 20 MEQ PO TBCR
40.0000 meq | EXTENDED_RELEASE_TABLET | Freq: Once | ORAL | Status: AC
  Administered 2021-06-21: 40 meq via ORAL
  Filled 2021-06-21: qty 2

## 2021-06-21 MED ORDER — NIRMATRELVIR/RITONAVIR (PAXLOVID)TABLET
3.0000 | ORAL_TABLET | Freq: Two times a day (BID) | ORAL | Status: DC
  Administered 2021-06-21 – 2021-06-24 (×7): 3 via ORAL
  Filled 2021-06-21: qty 30

## 2021-06-21 MED ORDER — IPRATROPIUM-ALBUTEROL 20-100 MCG/ACT IN AERS
2.0000 | INHALATION_SPRAY | Freq: Two times a day (BID) | RESPIRATORY_TRACT | Status: DC
  Administered 2021-06-21 – 2021-06-24 (×6): 2 via RESPIRATORY_TRACT

## 2021-06-21 MED ORDER — ENSURE ENLIVE PO LIQD
237.0000 mL | Freq: Three times a day (TID) | ORAL | Status: DC
  Administered 2021-06-21 – 2021-06-24 (×5): 237 mL via ORAL

## 2021-06-21 NOTE — Progress Notes (Addendum)
Initial Nutrition Assessment  DOCUMENTATION CODES:   Non-severe (moderate) malnutrition in context of chronic illness  INTERVENTION:   Ensure Enlive po TID, each supplement provides 350 kcal and 20 grams of protein  Encourage PO intake and food preferences    NUTRITION DIAGNOSIS:   Moderate Malnutrition related to chronic illness (cancer) as evidenced by moderate fat depletion, moderate muscle depletion, severe muscle depletion.  GOAL:   Patient will meet greater than or equal to 90% of their needs  MONITOR:   PO intake, Supplement acceptance  REASON FOR ASSESSMENT:   Malnutrition Screening Tool    ASSESSMENT:   Pt with PMH of COPD, PE, SCLC s/p port-a-cath 2020 followed by Palliative care, and DM admitted form [facility] for hypoxia. Dx with COVID-19 and AKI.   Pt reports a 50 lb weight loss due to cancer and his treatments. States he had all the side effects but they have resolved.  Per chart review pt has had at least a 15% weight loss in the last 7 months. Unable to confirm additional weight hx at this time.  He reports good appetite but does not eat when he does not like the food. He states he does not like the food at [facility].  Pt does not give an account of what or how much he has been eating just that he does not like the food there. He ate 75% of Breakfast and 50% of his lunch today.  He likes chocolate boost and is agreeable to trying chocolate ensure while hospitalized.  Pt currently on room air.    Medications reviewed and include: 500 mg Vitamin C daily, SSI, zinc 220 mg daily NS with 20 mEq KCl/L @ 75 ml/hr  Labs reviewed: K 2.7, CRP: 16.7, Ferritin 436     NUTRITION - FOCUSED PHYSICAL EXAM:  Flowsheet Row Most Recent Value  Orbital Region Moderate depletion  Upper Arm Region Moderate depletion  Thoracic and Lumbar Region Moderate depletion  Buccal Region Moderate depletion  Temple Region Severe depletion  Clavicle Bone Region Moderate  depletion  Clavicle and Acromion Bone Region Moderate depletion  Scapular Bone Region Moderate depletion  Dorsal Hand Severe depletion  Patellar Region Severe depletion  Anterior Thigh Region Severe depletion  Posterior Calf Region Severe depletion  Edema (RD Assessment) None  Hair Reviewed  Eyes Reviewed  Mouth Reviewed  Skin Reviewed  Nails Reviewed       Diet Order:   Diet Order             Diet regular Room service appropriate? Yes; Fluid consistency: Thin  Diet effective now                   EDUCATION NEEDS:   Education needs have been addressed  Skin:  Skin Assessment: Reviewed RN Assessment  Last BM:  11/11  Height:   Ht Readings from Last 1 Encounters:  06/20/21 5\' 11"  (1.803 m)    Weight:   Wt Readings from Last 1 Encounters:  06/20/21 68.8 kg    Ideal Body Weight:     BMI:  Body mass index is 21.15 kg/m.  Estimated Nutritional Needs:   Kcal:  2100-2300  Protein:  105-120 grams  Fluid:  >2.1 L/day  Lockie Pares., RD, LDN, CNSC See AMiON for contact information

## 2021-06-21 NOTE — Progress Notes (Addendum)
PROGRESS NOTE    [name]  ENI:[id number] DOB: [birth year] DOA: 06/20/2021 PCP: Pcp, No   Chief complaint: COVID-19 infection  Brief Narrative:  As per H&P written by Dr. Denton Brick on 06/20/2021. [name] is a 75 y.o. male with medical history significant for COPD, pulmonary embolism, small cell lung cancer, diabetes mellitus. Sent to the ED from [facility] by EMS for reports of low O2 sats.  Per EMS O2 sats was 88 to 90% on room air when EMS arrived.  Nursing home notes at bedside reports sats of 81% on room air. On my evaluation patient is awake alert oriented x 4 and answering questions appropriately.  He denies difficulty breathing.  He reports nasal congestion and coughing.  He denies chest pain at this time.  Denies abdominal pain.  Denies pain with urination.  He denies nausea or vomiting.   ED Course: Temperature 98.3.  Heart rate 54-91.  Respiratory rate 14-28.  Blood pressure systolic dropped to 61/44, improved with IV fluids currently 101/69.  O2 sats at a time of my evaluation ranging from 90 to 96% on room air. WBC 22.9.  Troponin 520.  BNP 266.  Creatinine 1.16.  Potassium 3.3.  Lactic acidosis of 2.3 > 2.1 after 1 L bolus.  Chest x-ray suggesting atelectasis. With leukocytosis, IV ceftriaxone and azithromycin was started for possible pneumonia.  Prior to return of positive COVID test.  Assessment & Plan: 1-COVID-19 virus infection -Without significant respiratory symptoms appreciated. -Patient reports intermittent nonproductive coughing spells and nasal congestion. -Good saturation on room air; chest x-ray clear of infiltrates and currently afebrile. -Continue to follow inflammatory markers -Continue to follow WBCs trend -Continue the use of mucolytic's, bronchodilators and supportive care. -Continue the use of Paxlovid, vitamin C and zinc. -Procalcitonin is low and antibiotics will be on hold.  2-lactic acidosis -In the setting of dehydration -Continue fluid  resuscitation and follow lactic acid trend.  3-acute kidney injury -Creatinine on presentation 1.16; baseline 0.6 -Continue IV fluids and adequate hydration. -Continue to minimize nephrotoxic agents, avoid hypotension and the use of contrast. -Follow trend.  4-elevated troponin -No chest pain, no telemetry or EKG ischemic changes appreciated -Patient with recent 2D echo in September 2022 demonstrating normal ejection fraction and no wall motion abnormalities. -Continue the use of aspirin and Plavix.  5-hypertension -Blood pressure soft at time of admission -Continue IV fluids -Follow vital signs -Telmisartan will remain on hold.  6-type 2 diabetes mellitus with hyperglycemia (CBG 242) -Not on medication at home, following only diet management. -Will use sliding scale insulin while inpatient -Follow CBGs.  7-history of COPD -No signs of acute exacerbation -Continue as needed bronchodilators.  8-history of lung cancer -Status postchemotherapy; no longer receiving treatment -Continue outpatient follow-up with oncology service who  9-moderate malnutrition in the setting of chronic illness -Advised to maintain adequate hydration and will continue feeding supplements. -Appreciate nutritional service recommendation.  10-hypothyroidism -Continue Synthroid.  11-DNR -CODE STATUS has been confirmed with patient; given underlying extensive medical problems he is at risk for decompensation despite being currently stable. -Continue treatment as mentioned above and follow clinical response.  DVT prophylaxis: Lovenox Code Status: DNR Family Communication: No family at bedside.  Unable to reach patient's sister over the phone. Disposition:   Status is: Inpatient   Consultants:  None  Procedures:  See below for x-ray reports.  Antimicrobials/antiviral:  Paxlovid.  Subjective: Chronically ill in appearance, frail and expressing generalized weakness.  Intermittent coughing  spells and rhinorrhea has  been reported.  No fever.  Objective: Vitals:   06/21/21 0725 06/21/21 0743 06/21/21 0800 06/21/21 1402  BP: 140/82  (!) 157/82 (!) 186/89  Pulse: (!) 57  (!) 57 64  Resp: 18  18 18   Temp: (!) 97.5 F (36.4 C)   97.7 F (36.5 C)  TempSrc: Oral   Oral  SpO2: 95% 96% 96% 97%  Weight:      Height:        Intake/Output Summary (Last 24 hours) at 06/21/2021 1722 Last data filed at 06/21/2021 1300 Gross per 24 hour  Intake 1249.76 ml  Output 600 ml  Net 649.76 ml   Filed Weights   06/20/21 1513 06/20/21 2021  Weight: 73.5 kg 68.8 kg    Examination:  General exam: Afebrile, reporting intermittent coughing spells and general malaise.  No nausea, no vomiting, no chest pain. Respiratory system: Positive rhonchi bilaterally; no wheezing, no crackles. Cardiovascular system: Rate controlled, no rubs, no gallops, no JVD on exam. Gastrointestinal system: Abdomen is nondistended, soft and nontender. No organomegaly or masses felt. Normal bowel sounds heard. Central nervous system: Alert and oriented.  Generally weak; no focal deficits. Extremities: No cyanosis or clubbing. Skin: No petechiae. Psychiatry: Judgement and insight appear normal. Mood & affect appropriate.   Data Reviewed: I have personally reviewed following labs and imaging studies  CBC: Recent Labs  Lab 06/20/21 1537 06/21/21 0518  WBC 22.9* 16.1*  NEUTROABS 21.4* 14.0*  HGB 11.8* 10.4*  HCT 36.3* 32.2*  MCV 92.4 91.5  PLT 442* 315    Basic Metabolic Panel: Recent Labs  Lab 06/20/21 1537 06/21/21 0518  NA 139 140  K 3.3* 2.7*  CL 100 102  CO2 30 30  GLUCOSE 242* 111*  BUN 23 22  CREATININE 1.16 0.86  CALCIUM 8.2* 8.0*  MG  --  2.0  PHOS  --  2.6    GFR: Estimated Creatinine Clearance: 72.2 mL/min (by C-G formula based on SCr of 0.86 mg/dL).  Liver Function Tests: Recent Labs  Lab 06/20/21 1537 06/21/21 0518  AST 20 18  ALT 27 23  ALKPHOS 124 107  BILITOT 0.9  0.9  PROT 6.2* 5.7*  ALBUMIN 2.4* 2.3*    CBG: Recent Labs  Lab 06/21/21 0015 06/21/21 0720 06/21/21 1059 06/21/21 1617  GLUCAP 97 91 132* 113*     Recent Results (from the past 240 hour(s))  Resp Panel by RT-PCR (Flu A&B, Covid) Nasopharyngeal Swab     Status: Abnormal   Collection Time: 06/20/21  3:39 PM   Specimen: Nasopharyngeal Swab; Nasopharyngeal(NP) swabs in vial transport medium  Result Value Ref Range Status   SARS Coronavirus 2 by RT PCR POSITIVE (A) NEGATIVE Final    Comment: CRITICAL RESULT CALLED TO, READ BACK BY AND VERIFIED WITH:  H.SPENCES @ 1650 06/20/21 BY STEPHTR (NOTE) SARS-CoV-2 target nucleic acids are DETECTED.  The SARS-CoV-2 RNA is generally detectable in upper respiratory specimens during the acute phase of infection. Positive results are indicative of the presence of the identified virus, but do not rule out bacterial infection or co-infection with other pathogens not detected by the test. Clinical correlation with patient history and other diagnostic information is necessary to determine patient infection status. The expected result is Negative.  Fact Sheet for Patients: EntrepreneurPulse.com.au  Fact Sheet for Healthcare Providers: IncredibleEmployment.be  This test is not yet approved or cleared by the [place] FDA and  has been authorized for detection and/or diagnosis of SARS-CoV-2 by FDA under an Emergency  Use Authorization (EUA).  This EUA will remain in effect (meaning thi s test can be used) for the duration of  the COVID-19 declaration under Section 564(b)(1) of the Act, 21 U.S.C. section 360bbb-3(b)(1), unless the authorization is terminated or revoked sooner.     Influenza A by PCR NEGATIVE NEGATIVE Final   Influenza B by PCR NEGATIVE NEGATIVE Final    Comment: (NOTE) The Xpert Xpress SARS-CoV-2/FLU/RSV plus assay is intended as an aid in the diagnosis of influenza from Nasopharyngeal  swab specimens and should not be used as a sole basis for treatment. Nasal washings and aspirates are unacceptable for Xpert Xpress SARS-CoV-2/FLU/RSV testing.  Fact Sheet for Patients: EntrepreneurPulse.com.au  Fact Sheet for Healthcare Providers: IncredibleEmployment.be  This test is not yet approved or cleared by the [place] FDA and has been authorized for detection and/or diagnosis of SARS-CoV-2 by FDA under an Emergency Use Authorization (EUA). This EUA will remain in effect (meaning this test can be used) for the duration of the COVID-19 declaration under Section 564(b)(1) of the Act, 21 U.S.C. section 360bbb-3(b)(1), unless the authorization is terminated or revoked.  Performed at [facility], [address], [place], [place] [address]   Culture, blood (routine x 2)     Status: None (Preliminary result)   Collection Time: 06/20/21  4:28 PM   Specimen: Right Antecubital; Blood  Result Value Ref Range Status   Specimen Description   Final    RIGHT ANTECUBITAL BOTTLES DRAWN AEROBIC AND ANAEROBIC   Special Requests Blood Culture adequate volume  Final   Culture   Final    NO GROWTH < 24 HOURS Performed at [facility], [address] [place], [place] [address]    Report Status PENDING  Incomplete  Culture, blood (routine x 2)     Status: None (Preliminary result)   Collection Time: 06/20/21  4:28 PM   Specimen: Left Antecubital; Blood  Result Value Ref Range Status   Specimen Description   Final    LEFT ANTECUBITAL BOTTLES DRAWN AEROBIC AND ANAEROBIC   Special Requests Blood Culture adequate volume  Final   Culture   Final    NO GROWTH < 24 HOURS Performed at [facility], [address], [place], [place] [address]    Report Status PENDING  Incomplete     Radiology Studies: DG Chest Port 1 View  Result Date: 06/20/2021 CLINICAL DATA:  Hypoxia. EXAM: PORTABLE CHEST 1 VIEW COMPARISON:  04/25/2021 FINDINGS: Patient has  LEFT-sided Port-A-Cath, tip overlying the level of superior vena cava. Low lung volumes. Heart size is enlarged but probably stable. Prominent hilar regions bilaterally, stable in appearance. There is streaky opacity in the LATERAL RIGHT mid lung zone and RIGHT perihilar region consistent with atelectasis. No evidence for pulmonary edema. No consolidations. IMPRESSION: 1. Low lung volumes. 2. Atelectasis in the LATERAL RIGHT mid lung zone and RIGHT perihilar region. Electronically Signed   By: Nolon Nations M.D.   On: 06/20/2021 15:49     Scheduled Meds:  vitamin C  500 mg Oral Daily   aspirin EC  81 mg Oral BID   clopidogrel  75 mg Oral Daily   enoxaparin (LOVENOX) injection  40 mg Subcutaneous Q24H   feeding supplement  237 mL Oral TID BM   insulin aspart  0-5 Units Subcutaneous QHS   insulin aspart  0-9 Units Subcutaneous TID WC   Ipratropium-Albuterol  2 puff Inhalation BID   levothyroxine  175 mcg Oral QAC breakfast   nirmatrelvir/ritonavir EUA  3 tablet Oral BID   zinc sulfate  220 mg Oral Daily   Continuous Infusions:  0.9 % NaCl with KCl 20 mEq / L 75 mL/hr at 06/21/21 1005     LOS: 0 days    Time spent: 35 minutes   Barton Dubois, MD [facility]   To contact the attending provider between 7A-7P or the covering provider during after hours 7P-7A, please log into the web site [email or web address] and access using universal [place] password for that web site. If you do not have the password, please call the hospital operator.  06/21/2021, 5:22 PM

## 2021-06-21 NOTE — Progress Notes (Signed)
Pt was ambulated to chair beside the bed. Pt was unsteady and was a two person assist. MD notified for  possible Pt consult.

## 2021-06-21 NOTE — Progress Notes (Signed)
   06/21/21 5391 Provider Notification Provider Name/Title Dr. Clearence Ped Date Provider Notified 06/21/21 Time Provider Notified [phone] Notification Type  (secure chat) Notification Reason Critical result Test performed and critical result potassium 2.7 Date Critical Result Received 06/21/21 Time Critical Result Received 0610

## 2021-06-22 LAB — FERRITIN: Ferritin: 370 ng/mL — ABNORMAL HIGH (ref 24–336)

## 2021-06-22 LAB — CBC WITH DIFFERENTIAL/PLATELET
Abs Immature Granulocytes: 0.1 10*3/uL — ABNORMAL HIGH (ref 0.00–0.07)
Basophils Absolute: 0 10*3/uL (ref 0.0–0.1)
Basophils Relative: 0 %
Eosinophils Absolute: 0.1 10*3/uL (ref 0.0–0.5)
Eosinophils Relative: 1 %
HCT: 34.6 % — ABNORMAL LOW (ref 39.0–52.0)
Hemoglobin: 11.1 g/dL — ABNORMAL LOW (ref 13.0–17.0)
Immature Granulocytes: 1 %
Lymphocytes Relative: 6 %
Lymphs Abs: 1.1 10*3/uL (ref 0.7–4.0)
MCH: 29.8 pg (ref 26.0–34.0)
MCHC: 32.1 g/dL (ref 30.0–36.0)
MCV: 92.8 fL (ref 80.0–100.0)
Monocytes Absolute: 0.8 10*3/uL (ref 0.1–1.0)
Monocytes Relative: 4 %
Neutro Abs: 14.8 10*3/uL — ABNORMAL HIGH (ref 1.7–7.7)
Neutrophils Relative %: 88 %
Platelets: 412 10*3/uL — ABNORMAL HIGH (ref 150–400)
RBC: 3.73 MIL/uL — ABNORMAL LOW (ref 4.22–5.81)
RDW: 15.6 % — ABNORMAL HIGH (ref 11.5–15.5)
WBC: 16.9 10*3/uL — ABNORMAL HIGH (ref 4.0–10.5)
nRBC: 0 % (ref 0.0–0.2)

## 2021-06-22 LAB — COMPREHENSIVE METABOLIC PANEL
ALT: 24 U/L (ref 0–44)
AST: 19 U/L (ref 15–41)
Albumin: 2.4 g/dL — ABNORMAL LOW (ref 3.5–5.0)
Alkaline Phosphatase: 113 U/L (ref 38–126)
Anion gap: 6 (ref 5–15)
BUN: 15 mg/dL (ref 8–23)
CO2: 30 mmol/L (ref 22–32)
Calcium: 8.5 mg/dL — ABNORMAL LOW (ref 8.9–10.3)
Chloride: 106 mmol/L (ref 98–111)
Creatinine, Ser: 0.8 mg/dL (ref 0.61–1.24)
GFR, Estimated: >60 mL/min (ref >60)
Glucose, Bld: 97 mg/dL (ref 70–99)
Potassium: 4.9 mmol/L (ref 3.5–5.1)
Sodium: 142 mmol/L (ref 135–145)
Total Bilirubin: 0.5 mg/dL (ref 0.3–1.2)
Total Protein: 6 g/dL — ABNORMAL LOW (ref 6.5–8.1)

## 2021-06-22 LAB — URINALYSIS, ROUTINE W REFLEX MICROSCOPIC
Bilirubin Urine: NEGATIVE
Glucose, UA: NEGATIVE mg/dL
Ketones, ur: NEGATIVE mg/dL
Nitrite: NEGATIVE
Protein, ur: 30 mg/dL — AB
RBC / HPF: >50 RBC/hpf — ABNORMAL HIGH (ref 0–5)
Specific Gravity, Urine: 1.008 (ref 1.005–1.030)
pH: 7 (ref 5.0–8.0)

## 2021-06-22 LAB — CBC
HCT: 34.4 % — ABNORMAL LOW (ref 39.0–52.0)
Hemoglobin: 11 g/dL — ABNORMAL LOW (ref 13.0–17.0)
MCH: 29.6 pg (ref 26.0–34.0)
MCHC: 32 g/dL (ref 30.0–36.0)
MCV: 92.7 fL (ref 80.0–100.0)
Platelets: 406 10*3/uL — ABNORMAL HIGH (ref 150–400)
RBC: 3.71 MIL/uL — ABNORMAL LOW (ref 4.22–5.81)
RDW: 15.7 % — ABNORMAL HIGH (ref 11.5–15.5)
WBC: 16.2 10*3/uL — ABNORMAL HIGH (ref 4.0–10.5)
nRBC: 0 % (ref 0.0–0.2)

## 2021-06-22 LAB — GLUCOSE, CAPILLARY
Glucose-Capillary: 83 mg/dL (ref 70–99)
Glucose-Capillary: 241 mg/dL — ABNORMAL HIGH (ref 70–99)
Glucose-Capillary: 136 mg/dL — ABNORMAL HIGH (ref 70–99)
Glucose-Capillary: 97 mg/dL (ref 70–99)

## 2021-06-22 LAB — CK: Total CK: 18 U/L — ABNORMAL LOW (ref 49–397)

## 2021-06-22 LAB — D-DIMER, QUANTITATIVE: D-Dimer, Quant: 3.26 ug/mL-FEU — ABNORMAL HIGH (ref 0.00–0.50)

## 2021-06-22 LAB — C-REACTIVE PROTEIN: CRP: 11.1 mg/dL — ABNORMAL HIGH (ref <1.0)

## 2021-06-22 LAB — MAGNESIUM: Magnesium: 2 mg/dL (ref 1.7–2.4)

## 2021-06-22 LAB — PHOSPHORUS: Phosphorus: 2.5 mg/dL (ref 2.5–4.6)

## 2021-06-22 MED ORDER — HYDRALAZINE HCL 20 MG/ML IJ SOLN: 10.0000 mg | Freq: Four times a day (QID) | INTRAMUSCULAR | Status: DC | PRN

## 2021-06-22 MED ORDER — SODIUM CHLORIDE 0.9 % IV SOLN: Freq: Once | INTRAVENOUS | Status: AC

## 2021-06-22 NOTE — Evaluation (Addendum)
Physical Therapy Evaluation Patient Details Name: REDFORD BEHRLE MRN: [id number] DOB: 27-May-1946 Today's Date: 06/22/2021  History of Present Illness  SARATH PRIVOTT is a 75 y.o. male with medical history significant for COPD, pulmonary embolism, small cell lung cancer, diabetes mellitus.  Sent to the ED from [facility] by EMS for reports of low O2 sats.  Per EMS O2 sats was 88 to 90% on room air when EMS arrived.  Nursing home notes at bedside reports sats of 81% on room air.  On my evaluation patient is awake alert oriented x 4 and answering questions appropriately.  He denies difficulty breathing.  He reports nasal congestion and coughing.  He denies chest pain at this time.  Denies abdominal pain.  Denies pain with urination.  He denies nausea or vomiting.   Clinical Impression  Patient presents in chair assisted by nurse, awake, alert, and agreeable for therapy. Patient performs a sit to stand transfer w/ slow movement, requires Mod assist to stand completely upright due to buckling of knees/weakness. Patient started to sit back down after attempting a few steps using the RW, requires tactile cuing at low back to keep an upright posture and Mod/Max assist to complete ambulation from chair to bed d/t generalized weakness. Once seated at EOB, patient requires assist to lift B LE onto bed and scoot up in bed d/t generalized weakness, demonstrating slow labored movement. Patient will benefit from continued skilled physical therapy in hospital and recommended venue below to increase strength, balance, endurance for safe ADLs and gait.      Recommendations for follow up therapy are one component of a multi-disciplinary discharge planning process, led by the attending physician.  Recommendations may be updated based on patient status, additional functional criteria and insurance authorization.  Follow Up Recommendations Skilled nursing-short term rehab (<3 hours/day)    Assistance Recommended at  Discharge Frequent or constant Supervision/Assistance  Functional Status Assessment Patient has had a recent decline in their functional status and demonstrates the ability to make significant improvements in function in a reasonable and predictable amount of time.  Equipment Recommendations  None recommended by PT    Recommendations for Other Services       Precautions / Restrictions Precautions Precautions: Fall Restrictions Weight Bearing Restrictions: No      Mobility  Bed Mobility Overal bed mobility: Needs Assistance Bed Mobility: Sit to Supine       Sit to supine: Mod assist   General bed mobility comments: Slow labored movement, required assist to lift legs and to scoot up in bed.    Transfers Overall transfer level: Needs assistance Equipment used: Rolling walker (2 wheels) Transfers: Sit to/from Stand;Bed to chair/wheelchair/BSC Sit to Stand: Mod assist   Step pivot transfers: Mod assist;Max assist       General transfer comment: Slow labored movement, started to sit down while transfering from chair to bed d/t generalized weakness, required tactile cuing in low back to keep an upright posture    Ambulation/Gait Ambulation/Gait assistance: Mod assist;Max assist Gait Distance (Feet): 5 Feet Assistive device: Rolling walker (2 wheels) Gait Pattern/deviations: Decreased step length - right;Decreased step length - left;Decreased stride length;Knee flexed in stance - right;Knee flexed in stance - left Gait velocity: Decreased     General Gait Details: slow labored movement, required verbal and tactile cuing to keep an upright posture and complete ambulation to the bed.  Stairs            Emergency planning/management officer  Modified Rankin (Stroke Patients Only)       Balance Overall balance assessment: Needs assistance Sitting-balance support: Bilateral upper extremity supported;Feet supported Sitting balance-Leahy Scale: Fair Sitting balance - Comments:  fair/good seated at EOB   Standing balance support: Bilateral upper extremity supported;During functional activity;Reliant on assistive device for balance Standing balance-Leahy Scale: Poor Standing balance comment: using RW                             Pertinent Vitals/Pain      Home Living Family/patient expects to be discharged to:: Private residence Living Arrangements: Other relatives;Other (Comment) (Lives with brother normally. Staying at [facility] for rehab prior to hospital stay per patient.) Available Help at Discharge: Family;Available PRN/intermittently Type of Home: House Home Access: Stairs to enter Entrance Stairs-Rails: Can reach both;Right;Left Entrance Stairs-Number of Steps: 2   Home Layout: One level Home Equipment: Rollator (4 wheels);Cane - single point;Shower seat Additional Comments: Information per patient.    Prior Function Prior Level of Function : Needs assist             Mobility Comments: Modified independent household and community ambulator, uses [facility] for short distances and rollator for longer distances at baseline per patient. ADLs Comments: Assisted by family/SNF staff.     Hand Dominance        Extremity/Trunk Assessment   Upper Extremity Assessment Upper Extremity Assessment: Overall WFL for tasks assessed    Lower Extremity Assessment Lower Extremity Assessment: Generalized weakness       Communication   Communication: No difficulties  Cognition Arousal/Alertness: Awake/alert Behavior During Therapy: WFL for tasks assessed/performed Overall Cognitive Status: Within Functional Limits for tasks assessed                                          General Comments      Exercises     Assessment/Plan    PT Assessment Patient needs continued PT services  PT Problem List Decreased strength;Decreased mobility;Decreased range of motion;Decreased safety awareness;Decreased coordination;Decreased activity  tolerance;Decreased balance;Decreased knowledge of use of DME       PT Treatment Interventions DME instruction;Therapeutic exercise;Gait training;Balance training;Stair training;Therapeutic activities;Functional mobility training;Patient/family education    PT Goals (Current goals can be found in the Care Plan section)  Acute Rehab PT Goals Patient Stated Goal: Return home after rehab at SNF. PT Goal Formulation: With patient Time For Goal Achievement: 07/06/21 Potential to Achieve Goals: Fair    Frequency Min 3X/week   Barriers to discharge        Co-evaluation               AM-PAC PT "6 Clicks" Mobility  Outcome Measure Help needed turning from your back to your side while in a flat bed without using bedrails?: A Little Help needed moving from lying on your back to sitting on the side of a flat bed without using bedrails?: A Little Help needed moving to and from a bed to a chair (including a wheelchair)?: A Lot Help needed standing up from a chair using your arms (e.g., wheelchair or bedside chair)?: A Little Help needed to walk in hospital room?: A Lot Help needed climbing 3-5 steps with a railing? : Total 6 Click Score: 14    End of Session Equipment Utilized During Treatment: Gait belt Activity Tolerance: Patient tolerated treatment well;Patient  limited by fatigue Patient left: in bed;with call bell/phone within reach Nurse Communication: Mobility status PT Visit Diagnosis: Unsteadiness on feet (R26.81);Other abnormalities of gait and mobility (R26.89);Muscle weakness (generalized) (M62.81)    Time: 1440-1511 PT Time Calculation (min) (ACUTE ONLY): 31 min   Charges:   PT Evaluation $PT Eval Moderate Complexity: 1 Mod PT Treatments $Therapeutic Activity: 23-37 mins        Cassie Jones, SPT  During this treatment session, the therapist was present, participating in and directing the treatment.  4:03 PM, 06/22/21 Lonell Grandchild, MPT Physical Therapist  with [facility] 336 [phone] office [phone] mobile phone

## 2021-06-22 NOTE — Plan of Care (Addendum)
  Problem: Acute Rehab PT Goals(only PT should resolve) Goal: Pt Will Go Supine/Side To Sit Outcome: Progressing Flowsheets (Taken 06/22/2021 1601) Pt will go Supine/Side to Sit: with minimal assist Goal: Patient Will Transfer Sit To/From Stand Outcome: Progressing Flowsheets (Taken 06/22/2021 1601) Patient will transfer sit to/from stand: with minimal assist Goal: Pt Will Transfer Bed To Chair/Chair To Bed Outcome: Progressing Flowsheets (Taken 06/22/2021 1601) Pt will Transfer Bed to Chair/Chair to Bed:  with mod assist  with min assist Goal: Pt Will Ambulate Outcome: Progressing Flowsheets (Taken 06/22/2021 1601) Pt will Ambulate:  10 feet  with minimal assist  with moderate assist  with rolling walker    Cassie Jones, SPT  During this treatment session, the therapist was present, participating in and directing the treatment.  4:06 PM, 06/22/21 Lonell Grandchild, MPT Physical Therapist with [facility] 336 [phone] office [phone] mobile phone

## 2021-06-22 NOTE — Progress Notes (Signed)
Patient having bloody urine output. No pain or burning with urination. This is a change from yesterday .Notified MD. UA ordered.

## 2021-06-22 NOTE — TOC Initial Note (Signed)
Transition of Care [facility]) - Initial/Assessment Note    Patient Details  Name: [name] MRN: [id number] Date of Birth: 02-06-1946  Transition of Care [facility]) CM/SW Contact:    Salome Arnt, LCSW Phone Number: 06/22/2021, 3:31 PM  Clinical Narrative:  Pt admitted due to COVID-19. LCSW spoke with pt's sister to complete assessment. Pt has been a resident at [facility] for about 6 weeks. PT evaluated pt and recommend return to SNF. Per Melissa at [facility], okay to return. Facility aware COVID +. TOC will continue to follow.                  Expected Discharge Plan: Skilled Nursing Facility Barriers to Discharge: Continued Medical Work up   Patient Goals and CMS Choice Patient states their goals for this hospitalization and ongoing recovery are:: return to SNF   Choice offered to / list presented to : Sibling  Expected Discharge Plan and Services Expected Discharge Plan: [place] In-house Referral: Clinical Social Work   Post Acute Care Choice: Resumption of Svcs/PTA Provider Living arrangements for the past 2 months: [place]                 DME Arranged: N/A                    Prior Living Arrangements/Services Living arrangements for the past 2 months: [place] Lives with:: Facility Resident Patient language and need for interpreter reviewed:: Yes Do you feel safe going back to the place where you live?: Yes      Need for Family Participation in Patient Care: Yes (Comment)     Criminal Activity/Legal Involvement Pertinent to Current Situation/Hospitalization: No - Comment as needed  Activities of Daily Living Home Assistive Devices/Equipment: Cane (specify quad or straight), Eyeglasses, Oxygen, Walker (specify type) ADL Screening (condition at time of admission) Patient's cognitive ability adequate to safely complete daily activities?: Yes Is the patient deaf or have difficulty hearing?: No Does  the patient have difficulty seeing, even when wearing glasses/contacts?: Yes Does the patient have difficulty concentrating, remembering, or making decisions?: No Patient able to express need for assistance with ADLs?: Yes Does the patient have difficulty dressing or bathing?: No Independently performs ADLs?: No Communication: Independent Dressing (OT): Needs assistance Is this a change from baseline?: Pre-admission baseline Grooming: Needs assistance Is this a change from baseline?: Pre-admission baseline Feeding: Needs assistance Is this a change from baseline?: Pre-admission baseline Bathing: Needs assistance Is this a change from baseline?: Pre-admission baseline Toileting: Needs assistance Is this a change from baseline?: Pre-admission baseline In/Out Bed: Needs assistance Is this a change from baseline?: Pre-admission baseline Walks in Home: Needs assistance Is this a change from baseline?: Pre-admission baseline Does the patient have difficulty walking or climbing stairs?: Yes Weakness of Legs: Both Weakness of Arms/Hands: None  Permission Sought/Granted Permission sought to share information with : Chartered certified accountant granted to share information with : Yes, Verbal Permission Granted     Permission granted to share info w AGENCY: [employer] granted to share info w Relationship: SNF     Emotional Assessment       Orientation: : Oriented to Self, Oriented to Place Alcohol / Substance Use: Not Applicable Psych Involvement: No (comment)  Admission diagnosis:  SOB (shortness of breath) [R06.02] Acute respiratory failure with hypoxia ([place]) [J96.01] COVID-19 [U07.1] COVID-19 virus infection [U07.1] Patient Active Problem List   Diagnosis Date Noted  Malnutrition of moderate degree 06/21/2021   COVID-19 virus infection 06/20/2021   Elevated troponin 06/20/2021   AKI (acute kidney injury) ([place]) 06/20/2021   Benign prostatic  hyperplasia without lower urinary tract symptoms 04/05/2021   Deficiency of other specified B group vitamins 04/05/2021   Vitamin D deficiency 04/05/2021   Malignant neoplasm of unspecified part of unspecified bronchus or lung ([place]) 04/05/2021   History of falling 04/05/2021   Prsnl hx of TIA (TIA), and cereb infrc w/o resid deficits 04/05/2021   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   Closed fracture of neck of right femur ([place])    Primary hypertension    Physical deconditioning    Gastroesophageal reflux disease    Benign prostatic hyperplasia with urinary frequency    Hypothyroidism    Hyperlipidemia    Hip fracture ([place]) 02/08/2021   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   Weak 09/24/2020   Ambulatory dysfunction 09/24/2020   Long term (current) use of aspirin 08/07/2020   Other pulmonary embolism without acute cor pulmonale ([place]) 01/19/2020   Chronic obstructive pulmonary disease ([place]) 01/19/2020   Type 2 diabetes mellitus without complication ([place]) 57/97/2820   Goals of care, counseling/discussion 09/02/2018   Brain metastasis ([place]) 08/29/2018   Small cell carcinoma of lung, right ([place])    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 [place] [address] Phone: [phone] Fax: [phone]  [place], [place] [address] Phone: [phone] Fax: [phone]     Social Determinants of Health (SDOH)  Interventions    Readmission Risk Interventions Readmission Risk Prevention Plan 06/22/2021  Transportation Screening Complete  HRI or Home Care Consult Complete  Social Work Consult for [place] Planning/Counseling Complete  Palliative Care Screening Not Applicable  Medication Review Press photographer) Complete  Some recent data might be hidden

## 2021-06-22 NOTE — Progress Notes (Signed)
PROGRESS NOTE    DARROLD BEZEK  IDP:[id number] DOB: October 08, 1945 DOA: 06/20/2021 PCP: Pcp, No   Chief complaint: COVID-19 infection  Brief Narrative:  As per H&P written by Dr. Denton Brick on 06/20/2021. [name] is a 75 y.o. male with medical history significant for COPD, pulmonary embolism, small cell lung cancer, diabetes mellitus. Sent to the ED from [facility] by EMS for reports of low O2 sats.  Per EMS O2 sats was 88 to 90% on room air when EMS arrived.  Nursing home notes at bedside reports sats of 81% on room air. On my evaluation patient is awake alert oriented x 4 and answering questions appropriately.  He denies difficulty breathing.  He reports nasal congestion and coughing.  He denies chest pain at this time.  Denies abdominal pain.  Denies pain with urination.  He denies nausea or vomiting.   ED Course: Temperature 98.3.  Heart rate 54-91.  Respiratory rate 14-28.  Blood pressure systolic dropped to 44/31, improved with IV fluids currently 101/69.  O2 sats at a time of my evaluation ranging from 90 to 96% on room air. WBC 22.9.  Troponin 520.  BNP 266.  Creatinine 1.16.  Potassium 3.3.  Lactic acidosis of 2.3 > 2.1 after 1 L bolus.  Chest x-ray suggesting atelectasis. With leukocytosis, IV ceftriaxone and azithromycin was started for possible pneumonia.  Prior to return of positive COVID test.  Assessment & Plan: 1-COVID-19 virus infection -Continue the use of mucolytic's, bronchodilators and supportive care. -Continue Paxlovid, vitamin C and zinc. COVID-19 Labs  Recent Labs    06/20/21 1537 06/20/21 1538 06/21/21 0518 06/22/21 0556  DDIMER  --  3.74* 2.98* 3.26*  FERRITIN 455*  --  436* 370*  LDH  --  112  --   --   CRP 17.2*  --  16.7* 11.1*    Lab Results  Component Value Date   SARSCOV2NAA POSITIVE (A) 06/20/2021   [place] NEGATIVE 02/08/2021   [place] NEGATIVE 11/10/2020   [place] NEGATIVE 09/24/2020    2-lactic acidosis -In the COVID  infection dehydration -Continue fluid resuscitation and follow lactic acid trend.  3-acute kidney injury -Creatinine on presentation 1.16; baseline 0.6 -Creatinine improved with hydration -Continue to minimize nephrotoxic agents, avoid hypotension and the use of contrast. -Follow trend.  4-elevated troponin -Suspect demand ischemia in the setting of COVID infection -No chest pain, no telemetry or EKG ischemic changes appreciated -Patient with recent 2D echo in September 2022 demonstrating normal ejection fraction and no wall motion abnormalities. -Continue the use of aspirin and Plavix.  5-hypertension --Telmisartan will remain on hold. - may use IV Hydralazine 10 mg  Every 4 hours Prn for systolic blood pressure over 160 mmhg   6-type 2 diabetes mellitus with hyperglycemia (CBG 242) -Not on medication at home, following only diet management. -Will use sliding scale insulin while inpatient -Follow CBGs.  7-history of COPD Remains very very weak requires 2 to transfer, no fevers,-Continue as needed bronchodilators.  8-history of lung cancer -Status postchemotherapy; no longer receiving treatment -Continue outpatient follow-up with oncology service who  9-moderate malnutrition in the setting of chronic illness -Advised to maintain adequate hydration and will continue feeding supplements. -Appreciate nutritional service recommendation.  10-hypothyroidism -Continue Synthroid.  11-DNR -CODE STATUS has been confirmed with patient; given underlying extensive medical problems he is at risk for decompensation despite being currently stable. -Continue treatment as mentioned above and follow clinical response.  12) generalized weakness and deconditioning-- Remains very weak, requires 2  to transfer from bed to chair, -Anticipate discharge to SNF rehab after isolation period is completed  DVT prophylaxis: Lovenox Code Status: DNR Family Communication: No family at bedside.  Unable  to reach patient's sister over the phone. Disposition: -Anticipate discharge to SNF rehab after isolation period is completed  Status is: Inpatient   Consultants:  None  Procedures:  See below for x-ray reports.  Antimicrobials/antiviral:  Paxlovid.  Subjective:  Remains very weak, requires 2 to transfer from bed to chair, no fevers  Objective: Vitals:   06/22/21 0038 06/22/21 0508 06/22/21 0800 06/22/21 1321  BP: (!) 187/83 (!) 165/88 (!) 153/100 (!) 158/86  Pulse: 63 64 87 81  Resp:  20  16  Temp:  98.1 F (36.7 C)  98 F (36.7 C)  TempSrc:    Oral  SpO2:  95% 97% 93%  Weight:      Height:        Intake/Output Summary (Last 24 hours) at 06/22/2021 1602 Last data filed at 06/22/2021 0830 Gross per 24 hour  Intake 240 ml  Output 3300 ml  Net -3060 ml   Filed Weights   06/20/21 1513 06/20/21 2021  Weight: 73.5 kg 68.8 kg    Examination:  Physical Exam  Gen:- Awake Alert, in no acute distress  HEENT:- [place].AT, No sclera icterus Neck-Supple Neck,No JVD,.  Lungs-fair air movement, no wheezing CV- S1, S2 normal, RRR Abd-  +ve B.Sounds, Abd Soft, No tenderness,    Extremity/Skin:- No  edema,   good pedal pulses  Psych-baseline/underlying cognitive and memory deficits with occasional confusion episodes  Neuro-generalized weakness without new focal deficits, No tremors .   Data Reviewed: I have personally reviewed following labs and imaging studies  CBC: Recent Labs  Lab 06/20/21 1537 06/21/21 0518 06/22/21 0556  WBC 22.9* 16.1* 16.9*  NEUTROABS 21.4* 14.0* 14.8*  HGB 11.8* 10.4* 11.1*  HCT 36.3* 32.2* 34.6*  MCV 92.4 91.5 92.8  PLT 442* 377 412*    Basic Metabolic Panel: Recent Labs  Lab 06/20/21 1537 06/21/21 0518 06/22/21 0556  NA 139 140 142  K 3.3* 2.7* 4.9  CL 100 102 106  CO2 30 30 30   GLUCOSE 242* 111* 97  BUN 23 22 15   CREATININE 1.16 0.86 0.80  CALCIUM 8.2* 8.0* 8.5*  MG  --  2.0 2.0  PHOS  --  2.6 2.5    GFR: Estimated  Creatinine Clearance: 77.6 mL/min (by C-G formula based on SCr of 0.8 mg/dL).  Liver Function Tests: Recent Labs  Lab 06/20/21 1537 06/21/21 0518 06/22/21 0556  AST 20 18 19   ALT 27 23 24   ALKPHOS 124 107 113  BILITOT 0.9 0.9 0.5  PROT 6.2* 5.7* 6.0*  ALBUMIN 2.4* 2.3* 2.4*    CBG: Recent Labs  Lab 06/21/21 1059 06/21/21 1617 06/21/21 2145 06/22/21 0759 06/22/21 1123  GLUCAP 132* 113* 158* 83 241*     Recent Results (from the past 240 hour(s))  Resp Panel by RT-PCR (Flu A&B, Covid) Nasopharyngeal Swab     Status: Abnormal   Collection Time: 06/20/21  3:39 PM   Specimen: Nasopharyngeal Swab; Nasopharyngeal(NP) swabs in vial transport medium  Result Value Ref Range Status   SARS Coronavirus 2 by RT PCR POSITIVE (A) NEGATIVE Final    Comment: CRITICAL RESULT CALLED TO, READ BACK BY AND VERIFIED WITH:  H.SPENCES @ 1062 06/20/21 BY STEPHTR (NOTE) SARS-CoV-2 target nucleic acids are DETECTED.  The SARS-CoV-2 RNA is generally detectable in upper respiratory specimens during the  acute phase of infection. Positive results are indicative of the presence of the identified virus, but do not rule out bacterial infection or co-infection with other pathogens not detected by the test. Clinical correlation with patient history and other diagnostic information is necessary to determine patient infection status. The expected result is Negative.  Fact Sheet for Patients: EntrepreneurPulse.com.au  Fact Sheet for Healthcare Providers: IncredibleEmployment.be  This test is not yet approved or cleared by the [place] FDA and  has been authorized for detection and/or diagnosis of SARS-CoV-2 by FDA under an Emergency Use Authorization (EUA).  This EUA will remain in effect (meaning thi s test can be used) for the duration of  the COVID-19 declaration under Section 564(b)(1) of the Act, 21 U.S.C. section 360bbb-3(b)(1), unless the authorization  is terminated or revoked sooner.     Influenza A by PCR NEGATIVE NEGATIVE Final   Influenza B by PCR NEGATIVE NEGATIVE Final    Comment: (NOTE) The Xpert Xpress SARS-CoV-2/FLU/RSV plus assay is intended as an aid in the diagnosis of influenza from Nasopharyngeal swab specimens and should not be used as a sole basis for treatment. Nasal washings and aspirates are unacceptable for Xpert Xpress SARS-CoV-2/FLU/RSV testing.  Fact Sheet for Patients: EntrepreneurPulse.com.au  Fact Sheet for Healthcare Providers: IncredibleEmployment.be  This test is not yet approved or cleared by the [place] FDA and has been authorized for detection and/or diagnosis of SARS-CoV-2 by FDA under an Emergency Use Authorization (EUA). This EUA will remain in effect (meaning this test can be used) for the duration of the COVID-19 declaration under Section 564(b)(1) of the Act, 21 U.S.C. section 360bbb-3(b)(1), unless the authorization is terminated or revoked.  Performed at [facility], [address] [place], [place] [address]   Culture, blood (routine x 2)     Status: None (Preliminary result)   Collection Time: 06/20/21  4:28 PM   Specimen: Right Antecubital; Blood  Result Value Ref Range Status   Specimen Description   Final    RIGHT ANTECUBITAL BOTTLES DRAWN AEROBIC AND ANAEROBIC   Special Requests Blood Culture adequate volume  Final   Culture   Final    NO GROWTH < 24 HOURS Performed at [facility], [address], [place], [place] [address]    Report Status PENDING  Incomplete  Culture, blood (routine x 2)     Status: None (Preliminary result)   Collection Time: 06/20/21  4:28 PM   Specimen: Left Antecubital; Blood  Result Value Ref Range Status   Specimen Description   Final    LEFT ANTECUBITAL BOTTLES DRAWN AEROBIC AND ANAEROBIC   Special Requests Blood Culture adequate volume  Final   Culture   Final    NO GROWTH < 24 HOURS Performed at [facility], [address], [place], [place] [address]    Report Status PENDING  Incomplete     Radiology Studies: No results found.   Scheduled Meds:  vitamin C  500 mg Oral Daily   aspirin EC  81 mg Oral BID   clopidogrel  75 mg Oral Daily   enoxaparin (LOVENOX) injection  40 mg Subcutaneous Q24H   feeding supplement  237 mL Oral TID BM   insulin aspart  0-5 Units Subcutaneous QHS   insulin aspart  0-9 Units Subcutaneous TID WC   Ipratropium-Albuterol  2 puff Inhalation BID   levothyroxine  175 mcg Oral QAC breakfast   nirmatrelvir/ritonavir EUA  3 tablet Oral BID   zinc sulfate  220 mg  Oral Daily   Continuous Infusions:   LOS: 1 day   Roxan Hockey, MD [facility]  To contact the attending provider between 7A-7P or the covering provider during after hours 7P-7A, please log into the web site [email or web address] and access using universal [place] password for that web site. If you do not have the password, please call the hospital operator.  06/22/2021, 4:02 PM

## 2021-06-22 NOTE — Progress Notes (Signed)
Did flutter with patient x10.  Fairly good patient effort.

## 2021-06-22 NOTE — Progress Notes (Signed)
Patient had two readings of elevated BP. MD notified. Patient asymptomatic, no new orders.

## 2021-06-22 NOTE — NC FL2 (Signed)
[place] MEDICAID FL2 LEVEL OF CARE SCREENING TOOL     IDENTIFICATION  Patient Name: [name] Birthdate: [birth year] Sex: male Admission Date (Current Location): 06/20/2021  [facility] and [place] Number:  [employer] and Address:  [place] [address], [place]      Provider Number: [id number]  Attending Physician Name and Address:  Roxan Hockey, MD  Relative Name and Phone Number:       Current Level of Care: Hospital Recommended Level of Care: [place] Prior Approval Number:    Date Approved/Denied:   PASRR Number:    Discharge Plan: SNF    Current Diagnoses: Patient Active Problem List   Diagnosis Date Noted   Malnutrition of moderate degree 06/21/2021   COVID-19 virus infection 06/20/2021   Elevated troponin 06/20/2021   AKI (acute kidney injury) ([place]) 06/20/2021   Benign prostatic hyperplasia without lower urinary tract symptoms 04/05/2021   Deficiency of other specified B group vitamins 04/05/2021   Vitamin D deficiency 04/05/2021   Malignant neoplasm of unspecified part of unspecified bronchus or lung ([place]) 04/05/2021   History of falling 04/05/2021   Prsnl hx of TIA (TIA), and cereb infrc w/o resid deficits 04/05/2021   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[place]) 04/05/2021   Closed fracture of neck of right femur ([place])    Primary hypertension    Physical deconditioning    Gastroesophageal reflux disease    Benign prostatic hyperplasia with urinary frequency    Hypothyroidism    Hyperlipidemia    Hip fracture ([place]) 02/08/2021   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   Weak 09/24/2020   Ambulatory dysfunction 09/24/2020   Long term (current) use of aspirin 08/07/2020   Other pulmonary embolism without acute cor pulmonale ([place]) 01/19/2020   Chronic obstructive pulmonary disease ([place]) 01/19/2020   Type 2 diabetes mellitus without complication ([place]) 27/51/7001   Goals of care,  counseling/discussion 09/02/2018   Brain metastasis ([place]) 08/29/2018   Small cell carcinoma of lung, right (HCC)    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 See d/c summary for updates.)  AMBULATORY STATUS COMMUNICATION OF NEEDS Skin   Limited Assist Verbally Skin abrasions, Bruising                       Personal Care Assistance Level of Assistance  Bathing, Feeding, Dressing Bathing Assistance: Limited assistance Feeding assistance: Limited assistance Dressing Assistance: Limited assistance     Functional Limitations Info  Sight, Hearing, Speech Sight Info: Adequate Hearing Info: Adequate Speech Info: Adequate    SPECIAL CARE FACTORS FREQUENCY  PT (By licensed PT)     PT Frequency: 5x weekly              Contractures      Additional Factors Info  Code Status, Allergies, Isolation Precautions Code Status Info: DNR Allergies  Info: No known allergies.     Isolation Precautions Info: Airborne/contact (COVID +)     Current Medications (06/22/2021):  This is the current hospital active medication list Current Facility-Administered Medications  Medication Dose Route Frequency Provider Last Rate Last Admin   albuterol (VENTOLIN HFA) 108 (90 Base) MCG/ACT inhaler 2 puff  2 puff Inhalation Q4H PRN Emokpae, Ejiroghene E, MD       ascorbic acid (VITAMIN C) tablet 500 mg  500 mg  Oral Daily Emokpae, Ejiroghene E, MD   500 mg at 06/22/21 0841   aspirin EC tablet 81 mg  81 mg Oral BID Emokpae, Ejiroghene E, MD   81 mg at 06/22/21 0841   clopidogrel (PLAVIX) tablet 75 mg  75 mg Oral Daily Emokpae, Ejiroghene E, MD   75 mg at 06/22/21 0841   enoxaparin (LOVENOX) injection 40 mg  40 mg Subcutaneous Q24H Emokpae, Ejiroghene E, MD   40 mg at 06/21/21 2151   feeding supplement (ENSURE ENLIVE / ENSURE PLUS) liquid 237 mL  237 mL Oral TID BM Barton Dubois, MD   237 mL at 06/21/21 2152   insulin aspart (novoLOG) injection 0-5 Units  0-5 Units Subcutaneous QHS Emokpae, Ejiroghene E, MD       insulin aspart (novoLOG) injection 0-9 Units  0-9 Units Subcutaneous TID WC Emokpae, Ejiroghene E, MD   3 Units at 06/22/21 1326   Ipratropium-Albuterol (COMBIVENT) respimat 2 puff  2 puff Inhalation BID Barton Dubois, MD   2 puff at 06/22/21 8280   levothyroxine (SYNTHROID) tablet 175 mcg  175 mcg Oral QAC breakfast Emokpae, Ejiroghene E, MD   175 mcg at 06/22/21 0557   nirmatrelvir/ritonavir EUA (PAXLOVID) 3 tablet  3 tablet Oral BID Barton Dubois, MD   3 tablet at 06/22/21 0842   ondansetron (ZOFRAN) tablet 4 mg  4 mg Oral Q6H PRN Emokpae, Ejiroghene E, MD       Or   ondansetron (ZOFRAN) injection 4 mg  4 mg Intravenous Q6H PRN Emokpae, Ejiroghene E, MD       polyethylene glycol (MIRALAX / GLYCOLAX) packet 17 g  17 g Oral Daily PRN Emokpae, Ejiroghene E, MD       zinc sulfate capsule 220 mg  220 mg Oral Daily Emokpae, Ejiroghene E, MD   220 mg at 06/22/21 0841     Discharge Medications: Please see discharge summary for a list of discharge medications.  Relevant Imaging Results:  Relevant Lab Results:   Additional Information    Salome Arnt, LCSW

## 2021-06-23 ENCOUNTER — Ambulatory Visit: Payer: TRICARE For Life (TFL) | Admitting: Internal Medicine

## 2021-06-23 LAB — COMPREHENSIVE METABOLIC PANEL
ALT: 18 U/L (ref 0–44)
AST: 17 U/L (ref 15–41)
Albumin: 2.4 g/dL — ABNORMAL LOW (ref 3.5–5.0)
Alkaline Phosphatase: 123 U/L (ref 38–126)
Anion gap: 9 (ref 5–15)
BUN: 13 mg/dL (ref 8–23)
CO2: 27 mmol/L (ref 22–32)
Calcium: 8.4 mg/dL — ABNORMAL LOW (ref 8.9–10.3)
Chloride: 103 mmol/L (ref 98–111)
Creatinine, Ser: 0.68 mg/dL (ref 0.61–1.24)
GFR, Estimated: >60 mL/min (ref >60)
Glucose, Bld: 96 mg/dL (ref 70–99)
Potassium: 4.2 mmol/L (ref 3.5–5.1)
Sodium: 139 mmol/L (ref 135–145)
Total Bilirubin: 1.2 mg/dL (ref 0.3–1.2)
Total Protein: 6.1 g/dL — ABNORMAL LOW (ref 6.5–8.1)

## 2021-06-23 LAB — CBC WITH DIFFERENTIAL/PLATELET
Abs Immature Granulocytes: 0.14 10*3/uL — ABNORMAL HIGH (ref 0.00–0.07)
Basophils Absolute: 0.1 10*3/uL (ref 0.0–0.1)
Basophils Relative: 0 %
Eosinophils Absolute: 0.2 10*3/uL (ref 0.0–0.5)
Eosinophils Relative: 1 %
HCT: 36.2 % — ABNORMAL LOW (ref 39.0–52.0)
Hemoglobin: 11.4 g/dL — ABNORMAL LOW (ref 13.0–17.0)
Immature Granulocytes: 1 %
Lymphocytes Relative: 7 %
Lymphs Abs: 1 10*3/uL (ref 0.7–4.0)
MCH: 28.4 pg (ref 26.0–34.0)
MCHC: 31.5 g/dL (ref 30.0–36.0)
MCV: 90 fL (ref 80.0–100.0)
Monocytes Absolute: 0.6 10*3/uL (ref 0.1–1.0)
Monocytes Relative: 4 %
Neutro Abs: 13.8 10*3/uL — ABNORMAL HIGH (ref 1.7–7.7)
Neutrophils Relative %: 87 %
Platelets: 412 10*3/uL — ABNORMAL HIGH (ref 150–400)
RBC: 4.02 MIL/uL — ABNORMAL LOW (ref 4.22–5.81)
RDW: 15.5 % (ref 11.5–15.5)
WBC: 15.9 10*3/uL — ABNORMAL HIGH (ref 4.0–10.5)
nRBC: 0 % (ref 0.0–0.2)

## 2021-06-23 LAB — FERRITIN: Ferritin: 500 ng/mL — ABNORMAL HIGH (ref 24–336)

## 2021-06-23 LAB — GLUCOSE, CAPILLARY
Glucose-Capillary: 97 mg/dL (ref 70–99)
Glucose-Capillary: 149 mg/dL — ABNORMAL HIGH (ref 70–99)
Glucose-Capillary: 159 mg/dL — ABNORMAL HIGH (ref 70–99)
Glucose-Capillary: 115 mg/dL — ABNORMAL HIGH (ref 70–99)

## 2021-06-23 LAB — MAGNESIUM: Magnesium: 2 mg/dL (ref 1.7–2.4)

## 2021-06-23 LAB — C-REACTIVE PROTEIN: CRP: 9.7 mg/dL — ABNORMAL HIGH (ref <1.0)

## 2021-06-23 LAB — D-DIMER, QUANTITATIVE: D-Dimer, Quant: 3.24 ug/mL-FEU — ABNORMAL HIGH (ref 0.00–0.50)

## 2021-06-23 LAB — PHOSPHORUS: Phosphorus: 3.1 mg/dL (ref 2.5–4.6)

## 2021-06-23 MED ORDER — ASPIRIN EC 81 MG PO TBEC
81.0000 mg | DELAYED_RELEASE_TABLET | Freq: Every day | ORAL | Status: DC
  Administered 2021-06-24: 81 mg via ORAL
  Filled 2021-06-23: qty 1

## 2021-06-23 NOTE — Discharge Instructions (Addendum)
1)Avoid ibuprofen/Advil/Aleve/Motrin/Goody Powders/Naproxen/BC powders/Meloxicam/Diclofenac/Indomethacin and other Nonsteroidal anti-inflammatory medications as these will make you more likely to bleed and can cause stomach ulcers, can also cause Kidney problems.   2)Please maintain adequate hydration, dietitian/nutritional consult advised  3)Out of bed to chair and ambulation daily strongly advised  4)Please complete PaxLOVID as prescribed   5)Please follow-up with Urologist Dr. Alyson Ingles in about --- for recheck and follow-up evaluation of HEMATURIA in his office----[facility], [address], [address], [place] [address] Phone Number----[phone]

## 2021-06-23 NOTE — Progress Notes (Signed)
PROGRESS NOTE    HAMILTON MARINELLO  ZOX:[id number] DOB: 1945-11-25 DOA: 06/20/2021 PCP: Pcp, No   Chief complaint: COVID-19 infection  Brief Narrative:  As per H&P written by Dr. Denton Brick on 06/20/2021. REHAAN VILORIA is a 75 y.o. male with medical history significant for COPD, pulmonary embolism, small cell lung cancer, diabetes mellitus. Sent to the ED from [facility] by EMS for reports of low O2 sats.  Per EMS O2 sats was 88 to 90% on room air when EMS arrived.  Nursing home notes at bedside reports sats of 81% on room air. On my evaluation patient is awake alert oriented x 4 and answering questions appropriately.  He denies difficulty breathing.  He reports nasal congestion and coughing.  He denies chest pain at this time.  Denies abdominal pain.  Denies pain with urination.  He denies nausea or vomiting.   ED Course: Temperature 98.3.  Heart rate 54-91.  Respiratory rate 14-28.  Blood pressure systolic dropped to 81/19, improved with IV fluids currently 101/69.  O2 sats at a time of my evaluation ranging from 90 to 96% on room air. WBC 22.9.  Troponin 520.  BNP 266.  Creatinine 1.16.  Potassium 3.3.  Lactic acidosis of 2.3 > 2.1 after 1 L bolus.  Chest x-ray suggesting atelectasis. With leukocytosis, IV ceftriaxone and azithromycin was started for possible pneumonia.  Prior to return of positive COVID test.  Assessment & Plan: 1-COVID-19 virus infection -Continue the use of mucolytic's, bronchodilators and supportive care. -Continue Paxlovid, vitamin C and zinc. COVID-19 Labs  Recent Labs    06/21/21 0518 06/22/21 0556 06/23/21 0650  DDIMER 2.98* 3.26* 3.24*  FERRITIN 436* 370* 500*  CRP 16.7* 11.1* 9.7*    Lab Results  Component Value Date   SARSCOV2NAA POSITIVE (A) 06/20/2021   [place] NEGATIVE 02/08/2021   [place] NEGATIVE 11/10/2020   [place] NEGATIVE 09/24/2020    2-lactic acidosis -In the COVID infection dehydration -Continue fluid resuscitation and  follow lactic acid trend.  3-acute kidney injury -Creatinine on presentation 1.16; baseline 0.6 -Creatinine improved with hydration -Continue to minimize nephrotoxic agents, avoid hypotension and the use of contrast. -Follow trend.  4-elevated troponin -Suspect demand ischemia in the setting of COVID infection -No chest pain, no telemetry or EKG ischemic changes appreciated -Patient with recent 2D echo in September 2022 demonstrating normal ejection fraction and no wall motion abnormalities. -Continue the use of aspirin and Plavix.  5-hypertension --Telmisartan will remain on hold. - may use IV Hydralazine 10 mg  Every 4 hours Prn for systolic blood pressure over 160 mmhg   6-type 2 diabetes mellitus with hyperglycemia (CBG 242) -Not on medication at home, following only diet management. -Will use sliding scale insulin while inpatient -Follow CBGs.  7-history of COPD Remains very very weak requires 2 to transfer, no fevers,-Continue as needed bronchodilators.  8-history of lung cancer -Status postchemotherapy; no longer receiving treatment -Continue outpatient follow-up with oncology service who  9-moderate malnutrition in the setting of chronic illness -Advised to maintain adequate hydration and will continue feeding supplements. -Appreciate nutritional service recommendation.  10-hypothyroidism -Continue Synthroid.  11-DNR -CODE STATUS has been confirmed with patient; given underlying extensive medical problems he is at risk for decompensation despite being currently stable. -Continue treatment as mentioned above and follow clinical response.  12) generalized weakness and deconditioning-- Remains very weak, requires 2 to transfer from bed to chair, -Anticipate discharge to SNF rehab after isolation period is completed  13) hematuria--- improving, no clots, H&H  stable  DVT prophylaxis: Lovenox Code Status: DNR Family Communication: No family at bedside.  Unable to  reach patient's sister over the phone. Disposition: -Anticipate discharge to SNF rehab after isolation period is completed  Status is: Inpatient   Consultants:  None  Procedures:  See below for x-ray reports.  Antimicrobials/antiviral:  Paxlovid.  Subjective:  Hematuria improving, able to get up to chair from bed with assist of 2  Objective: Vitals:   06/22/21 2152 06/23/21 0528 06/23/21 0748 06/23/21 1256  BP: (!) 170/97 (!) 143/95  (!) 125/103  Pulse: 70 70  (!) 101  Resp: 17 18  18   Temp: 98.6 F (37 C) 97.7 F (36.5 C)  98 F (36.7 C)  TempSrc:    Oral  SpO2: 93% 95% 96% 91%  Weight:      Height:        Intake/Output Summary (Last 24 hours) at 06/23/2021 1845 Last data filed at 06/23/2021 1644 Gross per 24 hour  Intake 840 ml  Output 3600 ml  Net -2760 ml   Filed Weights   06/20/21 1513 06/20/21 2021  Weight: 73.5 kg 68.8 kg    Examination:  Physical Exam  Gen:- Awake Alert, in no acute distress  HEENT:- [place].AT, No sclera icterus Neck-Supple Neck,No JVD,.  Lungs-fair air movement, no wheezing CV- S1, S2 normal, RRR Abd-  +ve B.Sounds, Abd Soft, No tenderness,    Extremity/Skin:- No  edema,   good pedal pulses  Psych-baseline/underlying cognitive and memory deficits with occasional confusion episodes  Neuro-generalized weakness without new focal deficits, No tremors .   Data Reviewed: I have personally reviewed following labs and imaging studies  CBC: Recent Labs  Lab 06/20/21 1537 06/21/21 0518 06/22/21 0556 06/22/21 2306 06/23/21 0650  WBC 22.9* 16.1* 16.9* 16.2* 15.9*  NEUTROABS 21.4* 14.0* 14.8*  --  13.8*  HGB 11.8* 10.4* 11.1* 11.0* 11.4*  HCT 36.3* 32.2* 34.6* 34.4* 36.2*  MCV 92.4 91.5 92.8 92.7 90.0  PLT 442* 377 412* 406* 412*    Basic Metabolic Panel: Recent Labs  Lab 06/20/21 1537 06/21/21 0518 06/22/21 0556 06/23/21 0650  NA 139 140 142 139  K 3.3* 2.7* 4.9 4.2  CL 100 102 106 103  CO2 30 30 30 27   GLUCOSE 242*  111* 97 96  BUN 23 22 15 13   CREATININE 1.16 0.86 0.80 0.68  CALCIUM 8.2* 8.0* 8.5* 8.4*  MG  --  2.0 2.0 2.0  PHOS  --  2.6 2.5 3.1    GFR: Estimated Creatinine Clearance: 77.6 mL/min (by C-G formula based on SCr of 0.68 mg/dL).  Liver Function Tests: Recent Labs  Lab 06/20/21 1537 06/21/21 0518 06/22/21 0556 06/23/21 0650  AST 20 18 19 17   ALT 27 23 24 18   ALKPHOS 124 107 113 123  BILITOT 0.9 0.9 0.5 1.2  PROT 6.2* 5.7* 6.0* 6.1*  ALBUMIN 2.4* 2.3* 2.4* 2.4*    CBG: Recent Labs  Lab 06/22/21 1623 06/22/21 2152 06/23/21 0800 06/23/21 1104 06/23/21 1618  GLUCAP 136* 97 97 149* 159*     Recent Results (from the past 240 hour(s))  Resp Panel by RT-PCR (Flu A&B, Covid) Nasopharyngeal Swab     Status: Abnormal   Collection Time: 06/20/21  3:39 PM   Specimen: Nasopharyngeal Swab; Nasopharyngeal(NP) swabs in vial transport medium  Result Value Ref Range Status   SARS Coronavirus 2 by RT PCR POSITIVE (A) NEGATIVE Final    Comment: CRITICAL RESULT CALLED TO, READ BACK BY AND VERIFIED WITH:  H.SPENCES @ 1650 06/20/21 BY STEPHTR (NOTE) SARS-CoV-2 target nucleic acids are DETECTED.  The SARS-CoV-2 RNA is generally detectable in upper respiratory specimens during the acute phase of infection. Positive results are indicative of the presence of the identified virus, but do not rule out bacterial infection or co-infection with other pathogens not detected by the test. Clinical correlation with patient history and other diagnostic information is necessary to determine patient infection status. The expected result is Negative.  Fact Sheet for Patients: EntrepreneurPulse.com.au  Fact Sheet for Healthcare Providers: IncredibleEmployment.be  This test is not yet approved or cleared by the [place] FDA and  has been authorized for detection and/or diagnosis of SARS-CoV-2 by FDA under an Emergency Use Authorization (EUA).  This EUA  will remain in effect (meaning thi s test can be used) for the duration of  the COVID-19 declaration under Section 564(b)(1) of the Act, 21 U.S.C. section 360bbb-3(b)(1), unless the authorization is terminated or revoked sooner.     Influenza A by PCR NEGATIVE NEGATIVE Final   Influenza B by PCR NEGATIVE NEGATIVE Final    Comment: (NOTE) The Xpert Xpress SARS-CoV-2/FLU/RSV plus assay is intended as an aid in the diagnosis of influenza from Nasopharyngeal swab specimens and should not be used as a sole basis for treatment. Nasal washings and aspirates are unacceptable for Xpert Xpress SARS-CoV-2/FLU/RSV testing.  Fact Sheet for Patients: EntrepreneurPulse.com.au  Fact Sheet for Healthcare Providers: IncredibleEmployment.be  This test is not yet approved or cleared by the [place] FDA and has been authorized for detection and/or diagnosis of SARS-CoV-2 by FDA under an Emergency Use Authorization (EUA). This EUA will remain in effect (meaning this test can be used) for the duration of the COVID-19 declaration under Section 564(b)(1) of the Act, 21 U.S.C. section 360bbb-3(b)(1), unless the authorization is terminated or revoked.  Performed at [facility], [address], [place], [place] [address]   Culture, blood (routine x 2)     Status: None (Preliminary result)   Collection Time: 06/20/21  4:28 PM   Specimen: Right Antecubital; Blood  Result Value Ref Range Status   Specimen Description   Final    RIGHT ANTECUBITAL BOTTLES DRAWN AEROBIC AND ANAEROBIC   Special Requests Blood Culture adequate volume  Final   Culture   Final    NO GROWTH 3 DAYS Performed at [facility], [address], [place], [place] [address]    Report Status PENDING  Incomplete  Culture, blood (routine x 2)     Status: None (Preliminary result)   Collection Time: 06/20/21  4:28 PM   Specimen: Left Antecubital; Blood  Result Value Ref Range Status   Specimen  Description   Final    LEFT ANTECUBITAL BOTTLES DRAWN AEROBIC AND ANAEROBIC   Special Requests Blood Culture adequate volume  Final   Culture   Final    NO GROWTH 3 DAYS Performed at [facility], [address], [place], [place] [address]    Report Status PENDING  Incomplete     Radiology Studies: No results found.   Scheduled Meds:  vitamin C  500 mg Oral Daily   aspirin EC  81 mg Oral BID   clopidogrel  75 mg Oral Daily   enoxaparin (LOVENOX) injection  40 mg Subcutaneous Q24H   feeding supplement  237 mL Oral TID BM   insulin aspart  0-5 Units Subcutaneous QHS   insulin aspart  0-9 Units Subcutaneous TID WC   Ipratropium-Albuterol  2 puff Inhalation BID  levothyroxine  175 mcg Oral QAC breakfast   nirmatrelvir/ritonavir EUA  3 tablet Oral BID   zinc sulfate  220 mg Oral Daily   Continuous Infusions:   LOS: 2 days   Roxan Hockey, MD [facility]  To contact the attending provider between 7A-7P or the covering provider during after hours 7P-7A, please log into the web site [email or web address] and access using universal [place] password for that web site. If you do not have the password, please call the hospital operator.  06/23/2021, 6:45 PM

## 2021-06-23 NOTE — Progress Notes (Signed)
900ML of yellow cloudy urine emptied.

## 2021-06-23 NOTE — TOC Transition Note (Signed)
Transition of Care [facility]) - CM/SW Discharge Note   Patient Details  Name: JOCOB DAMBACH MRN: [id number] Date of Birth: Jun 01, 1946  [facility] CM/SW Contact:  Natasha Bence, LCSW Phone Number: 06/23/2021, 12:47 PM   Clinical Narrative:    CSW notified of patient's readiness to discharge. CSW contacted Melissa with [facility]. Melissa agreeable to accept patient back to facility today. CSW completed med necessity. Nurse to call report. TOC signing off.    Final next level of care: Skilled Nursing Facility Barriers to Discharge: Barriers Resolved   Patient Goals and CMS Choice Patient states their goals for this hospitalization and ongoing recovery are:: Return to SNF CMS Medicare.gov Compare Post Acute Care list provided to:: Patient Choice offered to / list presented to : Patient  Discharge Placement                Patient to be transferred to facility by: [facility] Name of family member notified: Armstead Peaks (Sister)   [phone] Patient and family notified of of transfer: 06/23/21  Discharge Plan and Services In-house Referral: Clinical Social Work   Post Acute Care Choice: Resumption of Svcs/PTA Provider          DME Arranged: N/A                    Social Determinants of Health ([place]) Interventions     Readmission Risk Interventions Readmission Risk Prevention Plan 06/22/2021  Transportation Screening Complete  HRI or [place] Complete  Social Work Consult for [place] Planning/Counseling Complete  Palliative Care Screening Not Applicable  Medication Review Press photographer) Complete  Some recent data might be hidden

## 2021-06-23 NOTE — Progress Notes (Signed)
Pt was assisted to the chair with maximum assist from two people. Encouraged to stay in the chair as long as he could tolerate.

## 2021-06-24 LAB — FERRITIN: Ferritin: 335 ng/mL (ref 24–336)

## 2021-06-24 LAB — COMPREHENSIVE METABOLIC PANEL
ALT: 18 U/L (ref 0–44)
AST: 15 U/L (ref 15–41)
Albumin: 2.4 g/dL — ABNORMAL LOW (ref 3.5–5.0)
Alkaline Phosphatase: 120 U/L (ref 38–126)
Anion gap: 9 (ref 5–15)
BUN: 17 mg/dL (ref 8–23)
CO2: 28 mmol/L (ref 22–32)
Calcium: 8.3 mg/dL — ABNORMAL LOW (ref 8.9–10.3)
Chloride: 100 mmol/L (ref 98–111)
Creatinine, Ser: 0.68 mg/dL (ref 0.61–1.24)
GFR, Estimated: >60 mL/min (ref >60)
Glucose, Bld: 124 mg/dL — ABNORMAL HIGH (ref 70–99)
Potassium: 3.7 mmol/L (ref 3.5–5.1)
Sodium: 137 mmol/L (ref 135–145)
Total Bilirubin: 0.3 mg/dL (ref 0.3–1.2)
Total Protein: 6 g/dL — ABNORMAL LOW (ref 6.5–8.1)

## 2021-06-24 LAB — GLUCOSE, CAPILLARY
Glucose-Capillary: 115 mg/dL — ABNORMAL HIGH (ref 70–99)
Glucose-Capillary: 121 mg/dL — ABNORMAL HIGH (ref 70–99)

## 2021-06-24 LAB — CBC WITH DIFFERENTIAL/PLATELET
Abs Immature Granulocytes: 0.15 10*3/uL — ABNORMAL HIGH (ref 0.00–0.07)
Basophils Absolute: 0.1 10*3/uL (ref 0.0–0.1)
Basophils Relative: 0 %
Eosinophils Absolute: 0.2 10*3/uL (ref 0.0–0.5)
Eosinophils Relative: 1 %
HCT: 34.9 % — ABNORMAL LOW (ref 39.0–52.0)
Hemoglobin: 11 g/dL — ABNORMAL LOW (ref 13.0–17.0)
Immature Granulocytes: 1 %
Lymphocytes Relative: 7 %
Lymphs Abs: 1 10*3/uL (ref 0.7–4.0)
MCH: 28.8 pg (ref 26.0–34.0)
MCHC: 31.5 g/dL (ref 30.0–36.0)
MCV: 91.4 fL (ref 80.0–100.0)
Monocytes Absolute: 0.8 10*3/uL (ref 0.1–1.0)
Monocytes Relative: 5 %
Neutro Abs: 13.2 10*3/uL — ABNORMAL HIGH (ref 1.7–7.7)
Neutrophils Relative %: 86 %
Platelets: 444 10*3/uL — ABNORMAL HIGH (ref 150–400)
RBC: 3.82 MIL/uL — ABNORMAL LOW (ref 4.22–5.81)
RDW: 15.8 % — ABNORMAL HIGH (ref 11.5–15.5)
WBC: 15.3 10*3/uL — ABNORMAL HIGH (ref 4.0–10.5)
nRBC: 0 % (ref 0.0–0.2)

## 2021-06-24 LAB — D-DIMER, QUANTITATIVE: D-Dimer, Quant: 3.09 ug/mL-FEU — ABNORMAL HIGH (ref 0.00–0.50)

## 2021-06-24 LAB — C-REACTIVE PROTEIN: CRP: 9.8 mg/dL — ABNORMAL HIGH (ref <1.0)

## 2021-06-24 LAB — MAGNESIUM: Magnesium: 1.9 mg/dL (ref 1.7–2.4)

## 2021-06-24 LAB — PHOSPHORUS: Phosphorus: 3.5 mg/dL (ref 2.5–4.6)

## 2021-06-24 MED ORDER — TELMISARTAN 40 MG PO TABS
40.0000 mg | ORAL_TABLET | Freq: Every day | ORAL | 3 refills | Status: AC
Start: 2021-06-24 — End: ?

## 2021-06-24 MED ORDER — IPRATROPIUM-ALBUTEROL 20-100 MCG/ACT IN AERS: 2.0000 | INHALATION_SPRAY | Freq: Three times a day (TID) | RESPIRATORY_TRACT | 2 refills | Status: AC

## 2021-06-24 MED ORDER — ASCORBIC ACID 500 MG PO TABS
500.0000 mg | ORAL_TABLET | Freq: Every day | ORAL | 2 refills | Status: AC
Start: 2021-06-25 — End: ?

## 2021-06-24 MED ORDER — NIRMATRELVIR/RITONAVIR (PAXLOVID)TABLET: 3.0000 | ORAL_TABLET | Freq: Two times a day (BID) | ORAL | 0 refills | Status: AC

## 2021-06-24 MED ORDER — ROSUVASTATIN CALCIUM 20 MG PO TABS: 20.0000 mg | ORAL_TABLET | Freq: Every evening | ORAL | 3 refills | Status: AC

## 2021-06-24 MED ORDER — CHOLECALCIFEROL 50 MCG (2000 UT) PO TBDP: 1.0000 | ORAL_TABLET | Freq: Every day | ORAL | 3 refills | Status: AC

## 2021-06-24 MED ORDER — CYANOCOBALAMIN 1000 MCG PO TABS: 1000.0000 ug | ORAL_TABLET | Freq: Every day | ORAL | 1 refills | Status: AC

## 2021-06-24 MED ORDER — THIAMINE HCL 100 MG PO TABS: 100.0000 mg | ORAL_TABLET | Freq: Every day | ORAL | 2 refills | Status: AC

## 2021-06-24 MED ORDER — ZINC SULFATE 220 (50 ZN) MG PO CAPS
220.0000 mg | ORAL_CAPSULE | Freq: Every day | ORAL | 2 refills | Status: AC
Start: 2021-06-25 — End: ?

## 2021-06-24 MED ORDER — ALBUTEROL SULFATE HFA 108 (90 BASE) MCG/ACT IN AERS
2.0000 | INHALATION_SPRAY | Freq: Four times a day (QID) | RESPIRATORY_TRACT | 1 refills | Status: AC | PRN
Start: 2021-06-24 — End: ?

## 2021-06-24 MED ORDER — LEVOTHYROXINE SODIUM 150 MCG PO TABS: 150.0000 ug | ORAL_TABLET | Freq: Every day | ORAL | 3 refills | Status: AC

## 2021-06-24 MED ORDER — ADULT MULTIVITAMIN W/MINERALS CH: 1.0000 | ORAL_TABLET | Freq: Every day | ORAL | 3 refills | Status: AC

## 2021-06-24 MED ORDER — GUAIFENESIN ER 600 MG PO TB12: 600.0000 mg | ORAL_TABLET | Freq: Two times a day (BID) | ORAL | 0 refills | Status: AC

## 2021-06-24 MED ORDER — ASPIRIN 81 MG PO TBEC: 81.0000 mg | DELAYED_RELEASE_TABLET | Freq: Every day | ORAL | 11 refills | Status: AC

## 2021-06-24 MED ORDER — POLYETHYLENE GLYCOL 3350 17 G PO PACK: 17.0000 g | PACK | Freq: Every day | ORAL | 2 refills | Status: AC

## 2021-06-24 MED ORDER — TAMSULOSIN HCL 0.4 MG PO CAPS: 0.4000 mg | ORAL_CAPSULE | Freq: Every day | ORAL | 3 refills | Status: AC

## 2021-06-24 MED ORDER — PREDNISONE 20 MG PO TABS: 40.0000 mg | ORAL_TABLET | Freq: Every day | ORAL | 0 refills | Status: AC

## 2021-06-24 MED ORDER — CLOPIDOGREL BISULFATE 75 MG PO TABS: 75.0000 mg | ORAL_TABLET | Freq: Every day | ORAL | 3 refills | Status: AC

## 2021-06-24 NOTE — Discharge Summary (Signed)
[name], is a 75 y.o. male  DOB 1946/03/18  MRN [id number].  Admission date:  06/20/2021  Admitting Physician  Barton Dubois, MD  Discharge Date:  06/24/2021   Primary MD  Pcp, No  Recommendations for primary care physician for things to follow:   1)Avoid ibuprofen/Advil/Aleve/Motrin/Goody Powders/Naproxen/BC powders/Meloxicam/Diclofenac/Indomethacin and other Nonsteroidal anti-inflammatory medications as these will make you more likely to bleed and can cause stomach ulcers, can also cause Kidney problems.   2) please maintain adequate hydration, dietitian/nutritional consult advised  3) out of bed to chair and ambulation daily strongly advised  4) please complete PaxLOVID as prescribed   5)Please follow-up with Urologist Dr. Alyson Ingles in about --- for recheck and follow-up evaluation of HEMATURIA in his office----[facility], [address], [place] [address] Phone Number----[phone]   Admission Diagnosis  SOB (shortness of breath) [R06.02] Acute respiratory failure with hypoxia ([place]) [J96.01] COVID-19 [U07.1] COVID-19 virus infection [U07.1]   Discharge Diagnosis  SOB (shortness of breath) [R06.02] Acute respiratory failure with hypoxia ([place]) [J96.01] COVID-19 [U07.1] COVID-19 virus infection [U07.1]    Principal Problem:   COVID-19 virus infection Active Problems:   Chronic obstructive pulmonary disease ([place])   Type 2 diabetes mellitus without complication ([place])   Primary hypertension   DNR (do not resuscitate) discussion   Small cell carcinoma of lung, right ([place])   Brain metastasis ([place])   Hypothyroidism   Elevated troponin   AKI (acute kidney injury) ([place])   Malnutrition of moderate degree      Past Medical History:  Diagnosis Date   Actinic keratosis    AKI (acute kidney injury) ([place]) 06/20/2021   Arthritis    Colitis MAY 2012    CT  ABD/PELVIS HEP FLEXURE   COPD (chronic obstructive pulmonary disease) ([place])    Diabetes mellitus without complication ([place])    History of kidney stones    Hyperlipemia    Hypertension    NSAID long-term use NAPROXEN FOR OA   SCL Ca dx'd 08/2018    Past Surgical History:  Procedure Laterality Date   BACK SURGERY     spinal stenosis   BASAL CELL CARCINOMA EXCISION  FACE, arms feet, leg   CHOLECYSTECTOMY  JUNE 2011 MJ   STONES, PANCREATITIS   HIP PINNING,CANNULATED Right 02/10/2021   Procedure: CANNULATED HIP PINNING;  Surgeon: Mordecai Rasmussen, MD;  Location: [facility];  Service: Orthopedics;  Laterality: Right;  CRPP of right femoral neck fracture   KNEE ARTHROSCOPY WITH MEDIAL MENISECTOMY Right 03/24/2015   Procedure: KNEE ARTHROSCOPY WITH MEDIAL MENISECTOMY;  Surgeon: Carole Civil, MD;  Location: [facility];  Service: Orthopedics;  Laterality: Right;   KNEE SURGERY Left LEFT   arthroscopy   LITHOTRIPSY  80s   PORTACATH PLACEMENT Left 09/11/2018   Procedure: INSERTION PORT-A-CATH;  Surgeon: Virl Cagey, MD;  Location: [facility];  Service: General;  Laterality: Left;   SPINE SURGERY       HPI  from the history and physical done on the  day of admission:     Patient coming from: [place]   I have personally briefly reviewed patient's old medical records in [place]   Chief Complaint: Low O2 sats   HPI: [name] is a 75 y.o. male with medical history significant for COPD, pulmonary embolism, small cell lung cancer, diabetes mellitus. Sent to the ED from [facility] by EMS for reports of low O2 sats.  Per EMS O2 sats was 88 to 90% on room air when EMS arrived.  Nursing home notes at bedside reports sats of 81% on room air. On my evaluation patient is awake alert oriented x 4 and answering questions appropriately.  He denies difficulty breathing.  He reports nasal congestion and coughing.  He denies chest pain at this time.  Denies abdominal pain.  Denies pain with  urination.  He denies nausea or vomiting.   ED Course: Temperature 98.3.  Heart rate 54-91.  Respiratory rate 14-28.  Blood pressure systolic dropped to 38/75, improved with IV fluids currently 101/69.  O2 sats at a time of my evaluation ranging from 90 to 96% on room air. WBC 22.9.  Troponin 520.  BNP 266.  Creatinine 1.16.  Potassium 3.3.  Lactic acidosis of 2.3 > 2.1 after 1 L bolus.  Chest x-ray suggesting atelectasis. With leukocytosis, IV ceftriaxone and azithromycin was started for possible pneumonia.  Prior to return of positive COVID test.   Review of Systems: As per HPI all other systems reviewed and negative.      Hospital Course:   Assessment & Plan: 1-COVID-19 virus infection -Continue the use of mucolytic's, bronchodilators and supportive care. 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 -Durvalumab maintenance last dose on 02/13/2019, subsequent doses held due to colitis. --CT CAP on 03/09/2020 shows stable radiation changes involving the paramediastinal lungs and hilar regions bilaterally.  No evidence of recurrence or metastatic disease.  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  Stable small left adrenal gland nodules. -He had 3 small subcentimeter meta stasis in the right insula, right occipital lobe and right cerebellar hemisphere, treated with whole brain RT from 09/02/2018 through 09/13/2018. -MRI of the brain on 09/18/2019 did not show any evidence of intracranial metastasis. -Brain MRI on 01/16/2020 no brain metastasis.  Stable to slightly increased postradiation changes in the cerebral white matter. -Clinically and radiologically no evidence of recurrence of his metastatic small cell cancer    3-acute kidney injury -Creatinine on  presentation 1.16; baseline 0.6 -Creatinine improved with hydration -Continue to minimize nephrotoxic agents, avoid hypotension and the use of contrast.   4-elevated troponin -Suspect demand ischemia in the setting of COVID infection -No chest pain, no telemetry or EKG ischemic changes appreciated -Patient with recent 2D echo in September 2022 demonstrating normal ejection fraction and no wall motion abnormalities. -Continue the use of aspirin, statin and Plavix.   5-hypertension -- Okay to restart telmisartan     6-type 2 diabetes mellitus -recent A1c 5.6 reflecting excellent diabetic control PTA -anticipate hyperglycemia with steroids, -Continue dietary modifications   7-history of COPD -Prednisone and bronchodilators as ordered -No hypoxia  8-history of lung cancer -Status postchemotherapy; no longer receiving treatment -Continue outpatient follow-up with oncology service who   9-moderate malnutrition in the setting of chronic illness -Advised to maintain adequate  hydration and will continue feeding supplements. -Appreciate nutritional service recommendation.   10-hypothyroidism -Continue Synthroid.   11-Social/Ethics--- discussed with patient and his brother Delfino Lovett, patient is a DNR/DNI   12)Generalized weakness and deconditioning-- Remains very weak, requires 2 to transfer from bed to chair -Patient needs physical therapy rehab at SNF at [facility]   13)BPH with LUTs and Hematuria--- improving, no clots, H&H stable -Outpatient follow-up with Dr. Alyson Ingles for work-up of hematuria -Continue Flomax   Code Status: DNR Family Communication: Discussed with Brother Richard at bedside Disposition: - discharge to SNF rehab  Consultants:  None   Procedures:  See below for x-ray reports.   Antimicrobials/antiviral:  Paxlovid.  Discharge Condition: stable  Follow UP   Contact information for follow-up providers     McKenzie, Candee Furbish, MD. Schedule an  appointment as soon as possible for a visit in 1 week(s).   Specialty: Urology Why: Hematuria Contact information: [address] [place] [address] [phone]              Contact information for after-discharge care     Destination     [facility] .   Service: Skilled Nursing Contact information: [place] [phone]                     Diet and Activity recommendation:  As advised  Discharge Instructions    Discharge Instructions     Call MD for:  difficulty breathing, headache or visual disturbances   Complete by: As directed    Call MD for:  persistant dizziness or light-headedness   Complete by: As directed    Call MD for:  persistant nausea and vomiting   Complete by: As directed    Call MD for:  severe uncontrolled pain   Complete by: As directed    Call MD for:  temperature >100.4   Complete by: As directed    Diet - low sodium heart healthy   Complete by: As directed    Discharge instructions   Complete by: As directed    1)Avoid ibuprofen/Advil/Aleve/Motrin/Goody Powders/Naproxen/BC powders/Meloxicam/Diclofenac/Indomethacin and other Nonsteroidal anti-inflammatory medications as these will make you more likely to bleed and can cause stomach ulcers, can also cause Kidney problems.   2) please maintain adequate hydration, dietitian/nutritional consult advised  3) out of bed to chair and ambulation daily strongly advised  4) please complete PaxLOVID as prescribed  5)Please follow-up with Urologist Dr. Alyson Ingles in about --- for recheck and follow-up evaluation of HEMATURIA in his office----[facility], [address], [address], [place] [address] Phone Number----[phone]   Increase activity slowly   Complete by: As directed          Discharge Medications     Allergies as of 06/24/2021   No Known Allergies      Medication List     STOP taking  these medications    docusate sodium 100 MG capsule Commonly known as: COLACE   oxyCODONE 5 MG immediate release tablet Commonly known as: Oxy IR/ROXICODONE       TAKE these medications    acetaminophen 325 MG tablet Commonly known as: TYLENOL Take 2 tablets (650 mg total) by mouth every 6 (six) hours as needed for mild pain (or Fever >/= 101).   albuterol 108 (90 Base) MCG/ACT inhaler Commonly known as: VENTOLIN HFA Inhale 2 puffs into the lungs every 6 (six) hours as needed for wheezing  or shortness of breath.   ascorbic acid 500 MG tablet Commonly known as: VITAMIN C Take 1 tablet (500 mg total) by mouth daily. Start taking on: June 25, 2021   aspirin 81 MG EC tablet Take 1 tablet (81 mg total) by mouth daily. Swallow whole. Start taking on: June 25, 2021 What changed: when to take this   Cholecalciferol 50 MCG (2000 UT) Tbdp Take 1 tablet by mouth daily.   clopidogrel 75 MG tablet Commonly known as: PLAVIX Take 1 tablet (75 mg total) by mouth daily.   cyanocobalamin 1000 MCG tablet Take 1 tablet (1,000 mcg total) by mouth daily.   guaiFENesin 600 MG 12 hr tablet Commonly known as: MUCINEX Take 1 tablet (600 mg total) by mouth 2 (two) times daily.   Ipratropium-Albuterol 20-100 MCG/ACT Aers respimat Commonly known as: COMBIVENT Inhale 2 puffs into the lungs every 8 (eight) hours.   levothyroxine 150 MCG tablet Commonly known as: SYNTHROID Take 1 tablet (150 mcg total) by mouth daily before breakfast. What changed:  when to take this Another medication with the same name was removed. Continue taking this medication, and follow the directions you see here.   multivitamin with minerals Tabs tablet Take 1 tablet by mouth daily.   nirmatrelvir/ritonavir EUA 20 x 150 MG & 10 x 100MG  Tabs Commonly known as: PAXLOVID Take 3 tablets by mouth 2 (two) times daily for 5 days. Take nirmatrelvir (150 mg) two tablets twice daily for 5 days and ritonavir (100  mg) one tablet twice daily for 5 days.   Nutritional Supplement Liqd Take 120 mLs by mouth 2 (two) times daily.   pantoprazole 40 MG tablet Commonly known as: PROTONIX Take 1 tablet (40 mg total) by mouth daily.   polyethylene glycol 17 g packet Commonly known as: MIRALAX / GLYCOLAX Take 17 g by mouth daily. What changed:  when to take this reasons to take this   predniSONE 20 MG tablet Commonly known as: DELTASONE Take 2 tablets (40 mg total) by mouth daily with breakfast for 5 days.   rosuvastatin 20 MG tablet Commonly known as: CRESTOR Take 1 tablet (20 mg total) by mouth every evening.   tamsulosin 0.4 MG Caps capsule Commonly known as: FLOMAX Take 1 capsule (0.4 mg total) by mouth daily after supper.   telmisartan 40 MG tablet Commonly known as: MICARDIS Take 1 tablet (40 mg total) by mouth daily.   thiamine 100 MG tablet Take 1 tablet (100 mg total) by mouth daily.   zinc sulfate 220 (50 Zn) MG capsule Take 1 capsule (220 mg total) by mouth daily. Start taking on: June 25, 2021        Major procedures and Radiology Reports - PLEASE review detailed and final reports for all details, in brief -   DG Chest Port 1 View  Result Date: 06/20/2021 CLINICAL DATA:  Hypoxia. EXAM: PORTABLE CHEST 1 VIEW COMPARISON:  04/25/2021 FINDINGS: Patient has LEFT-sided Port-A-Cath, tip overlying the level of superior vena cava. Low lung volumes. Heart size is enlarged but probably stable. Prominent hilar regions bilaterally, stable in appearance. There is streaky opacity in the LATERAL RIGHT mid lung zone and RIGHT perihilar region consistent with atelectasis. No evidence for pulmonary edema. No consolidations. IMPRESSION: 1. Low lung volumes. 2. Atelectasis in the LATERAL RIGHT mid lung zone and RIGHT perihilar region. Electronically Signed   By: Nolon Nations M.D.   On: 06/20/2021 15:49    Micro Results    Recent Results (from the  past 240 hour(s))  Resp Panel by RT-PCR  (Flu A&B, Covid) Nasopharyngeal Swab     Status: Abnormal   Collection Time: 06/20/21  3:39 PM   Specimen: Nasopharyngeal Swab; Nasopharyngeal(NP) swabs in vial transport medium  Result Value Ref Range Status   SARS Coronavirus 2 by RT PCR POSITIVE (A) NEGATIVE Final    Comment: CRITICAL RESULT CALLED TO, READ BACK BY AND VERIFIED WITH:  H.SPENCES @ 0998 06/20/21 BY STEPHTR (NOTE) SARS-CoV-2 target nucleic acids are DETECTED.  The SARS-CoV-2 RNA is generally detectable in upper respiratory specimens during the acute phase of infection. Positive results are indicative of the presence of the identified virus, but do not rule out bacterial infection or co-infection with other pathogens not detected by the test. Clinical correlation with patient history and other diagnostic information is necessary to determine patient infection status. The expected result is Negative.  Fact Sheet for Patients: EntrepreneurPulse.com.au  Fact Sheet for Healthcare Providers: IncredibleEmployment.be  This test is not yet approved or cleared by the [place] FDA and  has been authorized for detection and/or diagnosis of SARS-CoV-2 by FDA under an Emergency Use Authorization (EUA).  This EUA will remain in effect (meaning thi s test can be used) for the duration of  the COVID-19 declaration under Section 564(b)(1) of the Act, 21 U.S.C. section 360bbb-3(b)(1), unless the authorization is terminated or revoked sooner.     Influenza A by PCR NEGATIVE NEGATIVE Final   Influenza B by PCR NEGATIVE NEGATIVE Final    Comment: (NOTE) The Xpert Xpress SARS-CoV-2/FLU/RSV plus assay is intended as an aid in the diagnosis of influenza from Nasopharyngeal swab specimens and should not be used as a sole basis for treatment. Nasal washings and aspirates are unacceptable for Xpert Xpress SARS-CoV-2/FLU/RSV testing.  Fact Sheet for  Patients: EntrepreneurPulse.com.au  Fact Sheet for Healthcare Providers: IncredibleEmployment.be  This test is not yet approved or cleared by the [place] FDA and has been authorized for detection and/or diagnosis of SARS-CoV-2 by FDA under an Emergency Use Authorization (EUA). This EUA will remain in effect (meaning this test can be used) for the duration of the COVID-19 declaration under Section 564(b)(1) of the Act, 21 U.S.C. section 360bbb-3(b)(1), unless the authorization is terminated or revoked.  Performed at [facility], [address] [place], [place] [address]   Culture, blood (routine x 2)     Status: None (Preliminary result)   Collection Time: 06/20/21  4:28 PM   Specimen: Right Antecubital; Blood  Result Value Ref Range Status   Specimen Description   Final    RIGHT ANTECUBITAL BOTTLES DRAWN AEROBIC AND ANAEROBIC   Special Requests Blood Culture adequate volume  Final   Culture   Final    NO GROWTH 3 DAYS Performed at [facility], [address], [place], [place] [address]    Report Status PENDING  Incomplete  Culture, blood (routine x 2)     Status: None (Preliminary result)   Collection Time: 06/20/21  4:28 PM   Specimen: Left Antecubital; Blood  Result Value Ref Range Status   Specimen Description   Final    LEFT ANTECUBITAL BOTTLES DRAWN AEROBIC AND ANAEROBIC   Special Requests Blood Culture adequate volume  Final   Culture   Final    NO GROWTH 3 DAYS Performed at [facility], [address] [place], [place] [address]    Report Status PENDING  Incomplete       Today   Subjective    Keturah Barre  today has no new complaints No fever  Or chills   No Nausea, Vomiting or Diarrhea         Patient has been seen and examined prior to discharge   Objective   Blood pressure (!) 143/92, pulse 88, temperature 98.6 F (37 C), resp. rate 19, height 5\' 11"  (1.803 m), weight 68.8 kg, SpO2 93 %.   Intake/Output  Summary (Last 24 hours) at 06/24/2021 1106 Last data filed at 06/24/2021 0900 Gross per 24 hour  Intake 840 ml  Output 2550 ml  Net -1710 ml   Exam Gen:- Awake Alert, no acute distress  HEENT:- [place].AT, No sclera icterus Neck-Supple Neck,No JVD,.  Lungs-  CTAB , good air movement bilaterally  CV- S1, S2 normal, regular Abd-  +ve B.Sounds, Abd Soft, No tenderness,    Extremity/Skin:- No  edema,   good pulses Psych-affect is appropriate, oriented x3, occasional confusional episodes, forgetful at times Neuro-generalized weakness, no new focal deficits, no tremors    Data Review   CBC w Diff:  Lab Results  Component Value Date   WBC 15.3 (H) 06/24/2021   HGB 11.0 (L) 06/24/2021   HCT 34.9 (L) 06/24/2021   PLT 444 (H) 06/24/2021   LYMPHOPCT 7 06/24/2021   MONOPCT 5 06/24/2021   EOSPCT 1 06/24/2021   BASOPCT 0 06/24/2021    CMP:  Lab Results  Component Value Date   NA 137 06/24/2021   K 3.7 06/24/2021   CL 100 06/24/2021   CO2 28 06/24/2021   BUN 17 06/24/2021   CREATININE 0.68 06/24/2021   PROT 6.0 (L) 06/24/2021   ALBUMIN 2.4 (L) 06/24/2021   BILITOT 0.3 06/24/2021   ALKPHOS 120 06/24/2021   AST 15 06/24/2021   ALT 18 06/24/2021  .   Total Discharge time is about 33 minutes  Roxan Hockey M.D on 06/24/2021 at 11:06 AM  Go to [email or web address] -  for contact info  [facility] - Office  [phone]

## 2021-06-24 NOTE — Plan of Care (Signed)

## 2021-06-24 NOTE — TOC Transition Note (Signed)
Transition of Care [facility]) - CM/SW Discharge Note   Patient Details  Name: [name] MRN: [id number] Date of Birth: [birth year]  [facility] CM/SW Contact:  Salome Arnt, LCSW Phone Number: 06/24/2021, 11:30 AM   Clinical Narrative:  Pt d/c today and will return to [facility]. Melissa at facility aware pt is COVID positive and agreeable to return. Pt will transport via [facility]. RN to print medical necessity and call report to SNF. D/C summary sent to facility. Pt's sister, [name] notified of d/c.      Final next level of care: Skilled Nursing Facility Barriers to Discharge: Barriers Resolved   Patient Goals and CMS Choice Patient states their goals for this hospitalization and ongoing recovery are:: Return to SNF CMS Medicare.gov Compare Post Acute Care list provided to:: Patient Choice offered to / list presented to : Patient  Discharge Placement                Patient to be transferred to facility by: [facility] Name of family member notified: [name], sister Patient and family notified of of transfer: 06/24/21  Discharge Plan and Services In-house Referral: Clinical Social Work   Post Acute Care Choice: Resumption of Svcs/PTA Provider          DME Arranged: N/A                    Social Determinants of Health ([place]) Interventions     Readmission Risk Interventions Readmission Risk Prevention Plan 06/22/2021  Transportation Screening Complete  HRI or Home Care Consult Complete  Social Work Consult for Parole Planning/Counseling Complete  Palliative Care Screening Not Applicable  Medication Review Press photographer) Complete  Some recent data might be hidden

## 2021-06-24 NOTE — Care Management Important Message (Addendum)
Important Message  Patient Details  Name: [name] MRN: [id number] Date of Birth: [birth year]   Medicare Important Message Given:  Yes  Spoke with sister Armstead Peaks telephonically, no additional copy needed.   [name] 06/24/2021, 1:06 PM

## 2021-06-25 LAB — CULTURE, BLOOD (ROUTINE X 2)
Culture: NO GROWTH
Culture: NO GROWTH
Special Requests: ADEQUATE
Special Requests: ADEQUATE

## 2021-07-18 ENCOUNTER — Other Ambulatory Visit: Payer: Self-pay | Admitting: *Deleted

## 2021-08-18 ENCOUNTER — Other Ambulatory Visit: Payer: Self-pay | Admitting: Neurology

## 2021-08-18 ENCOUNTER — Encounter (HOSPITAL_COMMUNITY): Payer: Self-pay | Admitting: Hematology

## 2021-08-18 ENCOUNTER — Other Ambulatory Visit (HOSPITAL_COMMUNITY): Payer: Self-pay | Admitting: Neurology

## 2021-08-18 DIAGNOSIS — G959 Disease of spinal cord, unspecified: Secondary | ICD-10-CM

## 2021-08-18 DIAGNOSIS — I639 Cerebral infarction, unspecified: Secondary | ICD-10-CM

## 2021-08-19 ENCOUNTER — Other Ambulatory Visit: Payer: Self-pay

## 2021-08-19 ENCOUNTER — Ambulatory Visit (HOSPITAL_COMMUNITY)
Admission: RE | Admit: 2021-08-19 | Discharge: 2021-08-19 | Disposition: A | Discharge status: Home / Self Care | Payer: Medicare Other | Source: Ambulatory Visit | Attending: Neurology | Admitting: Neurology

## 2021-08-19 ENCOUNTER — Inpatient Hospital Stay (HOSPITAL_COMMUNITY): Payer: Medicare Other

## 2021-08-19 ENCOUNTER — Inpatient Hospital Stay (HOSPITAL_COMMUNITY)
Admission: EM | Admit: 2021-08-19 | Discharge: 2021-09-07 | DRG: 065 | Disposition: E | Payer: Medicare Other | Source: Skilled Nursing Facility | Attending: Internal Medicine | Admitting: Internal Medicine

## 2021-08-19 ENCOUNTER — Encounter (HOSPITAL_COMMUNITY): Payer: Self-pay | Admitting: *Deleted

## 2021-08-19 DIAGNOSIS — I6319 Cerebral infarction due to embolism of other precerebral artery: Principal | ICD-10-CM | POA: Diagnosis present

## 2021-08-19 DIAGNOSIS — Z8616 Personal history of COVID-19: Secondary | ICD-10-CM

## 2021-08-19 DIAGNOSIS — R5381 Other malaise: Secondary | ICD-10-CM | POA: Diagnosis present

## 2021-08-19 DIAGNOSIS — G959 Disease of spinal cord, unspecified: Secondary | ICD-10-CM | POA: Insufficient documentation

## 2021-08-19 DIAGNOSIS — D72829 Elevated white blood cell count, unspecified: Secondary | ICD-10-CM | POA: Diagnosis present

## 2021-08-19 DIAGNOSIS — F17210 Nicotine dependence, cigarettes, uncomplicated: Secondary | ICD-10-CM | POA: Diagnosis present

## 2021-08-19 DIAGNOSIS — I10 Essential (primary) hypertension: Secondary | ICD-10-CM | POA: Diagnosis present

## 2021-08-19 DIAGNOSIS — I6310 Cerebral infarction due to embolism of unspecified precerebral artery: Secondary | ICD-10-CM

## 2021-08-19 DIAGNOSIS — Z7982 Long term (current) use of aspirin: Secondary | ICD-10-CM

## 2021-08-19 DIAGNOSIS — I959 Hypotension, unspecified: Secondary | ICD-10-CM | POA: Diagnosis present

## 2021-08-19 DIAGNOSIS — C3490 Malignant neoplasm of unspecified part of unspecified bronchus or lung: Secondary | ICD-10-CM | POA: Diagnosis not present

## 2021-08-19 DIAGNOSIS — Z79890 Hormone replacement therapy (postmenopausal): Secondary | ICD-10-CM | POA: Diagnosis not present

## 2021-08-19 DIAGNOSIS — R0602 Shortness of breath: Secondary | ICD-10-CM | POA: Diagnosis present

## 2021-08-19 DIAGNOSIS — Z79899 Other long term (current) drug therapy: Secondary | ICD-10-CM | POA: Diagnosis not present

## 2021-08-19 DIAGNOSIS — Z85828 Personal history of other malignant neoplasm of skin: Secondary | ICD-10-CM

## 2021-08-19 DIAGNOSIS — E785 Hyperlipidemia, unspecified: Secondary | ICD-10-CM | POA: Diagnosis present

## 2021-08-19 DIAGNOSIS — C7931 Secondary malignant neoplasm of brain: Secondary | ICD-10-CM | POA: Diagnosis present

## 2021-08-19 DIAGNOSIS — Z515 Encounter for palliative care: Secondary | ICD-10-CM | POA: Diagnosis not present

## 2021-08-19 DIAGNOSIS — I4891 Unspecified atrial fibrillation: Secondary | ICD-10-CM | POA: Diagnosis present

## 2021-08-19 DIAGNOSIS — Z7189 Other specified counseling: Secondary | ICD-10-CM | POA: Diagnosis not present

## 2021-08-19 DIAGNOSIS — N401 Enlarged prostate with lower urinary tract symptoms: Secondary | ICD-10-CM | POA: Diagnosis present

## 2021-08-19 DIAGNOSIS — Z7401 Bed confinement status: Secondary | ICD-10-CM

## 2021-08-19 DIAGNOSIS — I639 Cerebral infarction, unspecified: Secondary | ICD-10-CM | POA: Insufficient documentation

## 2021-08-19 DIAGNOSIS — E119 Type 2 diabetes mellitus without complications: Secondary | ICD-10-CM | POA: Diagnosis present

## 2021-08-19 DIAGNOSIS — Z801 Family history of malignant neoplasm of trachea, bronchus and lung: Secondary | ICD-10-CM | POA: Diagnosis not present

## 2021-08-19 DIAGNOSIS — E039 Hypothyroidism, unspecified: Secondary | ICD-10-CM | POA: Diagnosis present

## 2021-08-19 DIAGNOSIS — Z66 Do not resuscitate: Secondary | ICD-10-CM | POA: Diagnosis present

## 2021-08-19 DIAGNOSIS — I7121 Aneurysm of the ascending aorta, without rupture: Secondary | ICD-10-CM | POA: Diagnosis present

## 2021-08-19 DIAGNOSIS — R350 Frequency of micturition: Secondary | ICD-10-CM | POA: Diagnosis present

## 2021-08-19 DIAGNOSIS — N179 Acute kidney failure, unspecified: Secondary | ICD-10-CM | POA: Diagnosis present

## 2021-08-19 DIAGNOSIS — J9611 Chronic respiratory failure with hypoxia: Secondary | ICD-10-CM | POA: Diagnosis present

## 2021-08-19 DIAGNOSIS — E869 Volume depletion, unspecified: Secondary | ICD-10-CM | POA: Diagnosis present

## 2021-08-19 DIAGNOSIS — I248 Other forms of acute ischemic heart disease: Secondary | ICD-10-CM | POA: Diagnosis present

## 2021-08-19 DIAGNOSIS — Z9221 Personal history of antineoplastic chemotherapy: Secondary | ICD-10-CM

## 2021-08-19 DIAGNOSIS — Z923 Personal history of irradiation: Secondary | ICD-10-CM

## 2021-08-19 DIAGNOSIS — Z85118 Personal history of other malignant neoplasm of bronchus and lung: Secondary | ICD-10-CM

## 2021-08-19 DIAGNOSIS — K219 Gastro-esophageal reflux disease without esophagitis: Secondary | ICD-10-CM | POA: Diagnosis present

## 2021-08-19 DIAGNOSIS — J449 Chronic obstructive pulmonary disease, unspecified: Secondary | ICD-10-CM | POA: Diagnosis present

## 2021-08-19 DIAGNOSIS — Z7951 Long term (current) use of inhaled steroids: Secondary | ICD-10-CM

## 2021-08-19 DIAGNOSIS — N400 Enlarged prostate without lower urinary tract symptoms: Secondary | ICD-10-CM | POA: Diagnosis present

## 2021-08-19 DIAGNOSIS — Z8673 Personal history of transient ischemic attack (TIA), and cerebral infarction without residual deficits: Secondary | ICD-10-CM

## 2021-08-19 LAB — PROTIME-INR
INR: 1.2 (ref 0.8–1.2)
Prothrombin Time: 15.1 seconds (ref 11.4–15.2)

## 2021-08-19 LAB — CBC
HCT: 38.3 % — ABNORMAL LOW (ref 39.0–52.0)
Hemoglobin: 12 g/dL — ABNORMAL LOW (ref 13.0–17.0)
MCH: 28.6 pg (ref 26.0–34.0)
MCHC: 31.3 g/dL (ref 30.0–36.0)
MCV: 91.4 fL (ref 80.0–100.0)
Platelets: 447 10*3/uL — ABNORMAL HIGH (ref 150–400)
RBC: 4.19 MIL/uL — ABNORMAL LOW (ref 4.22–5.81)
RDW: 17.6 % — ABNORMAL HIGH (ref 11.5–15.5)
WBC: 16.3 10*3/uL — ABNORMAL HIGH (ref 4.0–10.5)
nRBC: 0 % (ref 0.0–0.2)

## 2021-08-19 LAB — DIFFERENTIAL
Abs Immature Granulocytes: 0.12 10*3/uL — ABNORMAL HIGH (ref 0.00–0.07)
Basophils Absolute: 0 10*3/uL (ref 0.0–0.1)
Basophils Relative: 0 %
Eosinophils Absolute: 0 10*3/uL (ref 0.0–0.5)
Eosinophils Relative: 0 %
Immature Granulocytes: 1 %
Lymphocytes Relative: 2 %
Lymphs Abs: 0.4 10*3/uL — ABNORMAL LOW (ref 0.7–4.0)
Monocytes Absolute: 0.5 10*3/uL (ref 0.1–1.0)
Monocytes Relative: 3 %
Neutro Abs: 15.3 10*3/uL — ABNORMAL HIGH (ref 1.7–7.7)
Neutrophils Relative %: 94 %

## 2021-08-19 LAB — HEMOGLOBIN A1C
Hgb A1c MFr Bld: 5.7 % — ABNORMAL HIGH (ref 4.8–5.6)
Mean Plasma Glucose: 116.89 mg/dL

## 2021-08-19 LAB — COMPREHENSIVE METABOLIC PANEL
ALT: 13 U/L (ref 0–44)
AST: 17 U/L (ref 15–41)
Albumin: 2.4 g/dL — ABNORMAL LOW (ref 3.5–5.0)
Alkaline Phosphatase: 136 U/L — ABNORMAL HIGH (ref 38–126)
Anion gap: 12 (ref 5–15)
BUN: 23 mg/dL (ref 8–23)
CO2: 26 mmol/L (ref 22–32)
Calcium: 8.6 mg/dL — ABNORMAL LOW (ref 8.9–10.3)
Chloride: 100 mmol/L (ref 98–111)
Creatinine, Ser: 1.26 mg/dL — ABNORMAL HIGH (ref 0.61–1.24)
GFR, Estimated: 59 mL/min — ABNORMAL LOW (ref >60)
Glucose, Bld: 165 mg/dL — ABNORMAL HIGH (ref 70–99)
Potassium: 3.9 mmol/L (ref 3.5–5.1)
Sodium: 138 mmol/L (ref 135–145)
Total Bilirubin: 0.7 mg/dL (ref 0.3–1.2)
Total Protein: 6.5 g/dL (ref 6.5–8.1)

## 2021-08-19 LAB — RESP PANEL BY RT-PCR (FLU A&B, COVID) ARPGX2
Influenza A by PCR: NEGATIVE
Influenza B by PCR: NEGATIVE
SARS Coronavirus 2 by RT PCR: NEGATIVE

## 2021-08-19 LAB — TROPONIN I (HIGH SENSITIVITY)
Troponin I (High Sensitivity): 29 ng/L — ABNORMAL HIGH (ref <18)
Troponin I (High Sensitivity): 31 ng/L — ABNORMAL HIGH (ref <18)

## 2021-08-19 LAB — I-STAT CHEM 8, ED
BUN: 22 mg/dL (ref 8–23)
Calcium, Ion: 1.11 mmol/L — ABNORMAL LOW (ref 1.15–1.40)
Chloride: 100 mmol/L (ref 98–111)
Creatinine, Ser: 1.3 mg/dL — ABNORMAL HIGH (ref 0.61–1.24)
Glucose, Bld: 151 mg/dL — ABNORMAL HIGH (ref 70–99)
HCT: 38 % — ABNORMAL LOW (ref 39.0–52.0)
Hemoglobin: 12.9 g/dL — ABNORMAL LOW (ref 13.0–17.0)
Potassium: 4 mmol/L (ref 3.5–5.1)
Sodium: 138 mmol/L (ref 135–145)
TCO2: 26 mmol/L (ref 22–32)

## 2021-08-19 LAB — APTT: aPTT: 30 seconds (ref 24–36)

## 2021-08-19 LAB — ETHANOL: Alcohol, Ethyl (B): <10 mg/dL (ref <10)

## 2021-08-19 LAB — CBG MONITORING, ED: Glucose-Capillary: 126 mg/dL — ABNORMAL HIGH (ref 70–99)

## 2021-08-19 LAB — GLUCOSE, CAPILLARY: Glucose-Capillary: 91 mg/dL (ref 70–99)

## 2021-08-19 MED ORDER — LACTATED RINGERS IV SOLN: INTRAVENOUS | Status: DC

## 2021-08-19 MED ORDER — ASPIRIN 300 MG RE SUPP: 300.0000 mg | Freq: Every day | RECTAL | Status: DC

## 2021-08-19 MED ORDER — STROKE: EARLY STAGES OF RECOVERY BOOK
Freq: Once | Status: DC
  Filled 2021-08-19: qty 1

## 2021-08-19 MED ORDER — ALBUTEROL SULFATE (2.5 MG/3ML) 0.083% IN NEBU: 2.5000 mg | INHALATION_SOLUTION | Freq: Four times a day (QID) | RESPIRATORY_TRACT | Status: DC | PRN

## 2021-08-19 MED ORDER — ASPIRIN 325 MG PO TABS
325.0000 mg | ORAL_TABLET | Freq: Every day | ORAL | Status: DC
  Administered 2021-08-19: 325 mg via ORAL
  Filled 2021-08-19: qty 1

## 2021-08-19 MED ORDER — ORAL CARE MOUTH RINSE
15.0000 mL | Freq: Two times a day (BID) | OROMUCOSAL | Status: DC
  Administered 2021-08-19: 15 mL via OROMUCOSAL

## 2021-08-19 MED ORDER — ALBUTEROL SULFATE HFA 108 (90 BASE) MCG/ACT IN AERS: 2.0000 | INHALATION_SPRAY | Freq: Four times a day (QID) | RESPIRATORY_TRACT | Status: DC | PRN

## 2021-08-19 MED ORDER — ENOXAPARIN SODIUM 40 MG/0.4ML IJ SOSY
40.0000 mg | PREFILLED_SYRINGE | INTRAMUSCULAR | Status: DC
  Administered 2021-08-19: 40 mg via SUBCUTANEOUS
  Filled 2021-08-19: qty 0.4

## 2021-08-19 MED ORDER — IPRATROPIUM-ALBUTEROL 0.5-2.5 (3) MG/3ML IN SOLN
3.0000 mL | Freq: Three times a day (TID) | RESPIRATORY_TRACT | Status: DC
  Administered 2021-08-19 – 2021-08-20 (×2): 3 mL via RESPIRATORY_TRACT
  Filled 2021-08-19: qty 3

## 2021-08-19 MED ORDER — INSULIN ASPART 100 UNIT/ML IJ SOLN
0.0000 [IU] | Freq: Three times a day (TID) | INTRAMUSCULAR | Status: DC
  Administered 2021-08-19: 2 [IU] via SUBCUTANEOUS
  Filled 2021-08-19: qty 1

## 2021-08-19 MED ORDER — LACTATED RINGERS IV BOLUS
500.0000 mL | Freq: Once | INTRAVENOUS | Status: AC
  Administered 2021-08-19: 500 mL via INTRAVENOUS

## 2021-08-19 MED ORDER — IPRATROPIUM-ALBUTEROL 0.5-2.5 (3) MG/3ML IN SOLN: 3.0000 mL | Freq: Three times a day (TID) | RESPIRATORY_TRACT | Status: DC

## 2021-08-19 MED ORDER — ACETAMINOPHEN 325 MG PO TABS: 650.0000 mg | ORAL_TABLET | ORAL | Status: DC | PRN

## 2021-08-19 MED ORDER — SODIUM CHLORIDE 0.9 % IV BOLUS
500.0000 mL | Freq: Once | INTRAVENOUS | Status: AC
  Administered 2021-08-19: 500 mL via INTRAVENOUS

## 2021-08-19 MED ORDER — AMIODARONE HCL IN DEXTROSE 360-4.14 MG/200ML-% IV SOLN
60.0000 mg/h | INTRAVENOUS | Status: AC
  Administered 2021-08-19 – 2021-08-20 (×2): 60 mg/h via INTRAVENOUS
  Filled 2021-08-19 (×2): qty 200

## 2021-08-19 MED ORDER — POLYETHYLENE GLYCOL 3350 17 G PO PACK
17.0000 g | PACK | Freq: Every day | ORAL | Status: DC
  Administered 2021-08-19: 17 g via ORAL
  Filled 2021-08-19: qty 1

## 2021-08-19 MED ORDER — LEVOTHYROXINE SODIUM 75 MCG PO TABS: 150.0000 ug | ORAL_TABLET | Freq: Every day | ORAL | Status: DC

## 2021-08-19 MED ORDER — PANTOPRAZOLE SODIUM 40 MG PO TBEC
40.0000 mg | DELAYED_RELEASE_TABLET | Freq: Every day | ORAL | Status: DC
  Administered 2021-08-19: 40 mg via ORAL
  Filled 2021-08-19: qty 1

## 2021-08-19 MED ORDER — ACETAMINOPHEN 650 MG RE SUPP: 650.0000 mg | RECTAL | Status: DC | PRN

## 2021-08-19 MED ORDER — TAMSULOSIN HCL 0.4 MG PO CAPS
0.4000 mg | ORAL_CAPSULE | Freq: Every day | ORAL | Status: DC
  Filled 2021-08-19: qty 1

## 2021-08-19 MED ORDER — INSULIN ASPART 100 UNIT/ML IJ SOLN: 0.0000 [IU] | Freq: Every day | INTRAMUSCULAR | Status: DC

## 2021-08-19 MED ORDER — IPRATROPIUM-ALBUTEROL 20-100 MCG/ACT IN AERS: 2.0000 | INHALATION_SPRAY | Freq: Three times a day (TID) | RESPIRATORY_TRACT | Status: DC

## 2021-08-19 MED ORDER — ACETAMINOPHEN 160 MG/5ML PO SOLN: 650.0000 mg | ORAL | Status: DC | PRN

## 2021-08-19 MED ORDER — ROSUVASTATIN CALCIUM 20 MG PO TABS
20.0000 mg | ORAL_TABLET | Freq: Every evening | ORAL | Status: DC
  Administered 2021-08-19: 20 mg via ORAL
  Filled 2021-08-19: qty 1

## 2021-08-19 MED ORDER — CHLORHEXIDINE GLUCONATE CLOTH 2 % EX PADS: 6.0000 | MEDICATED_PAD | Freq: Every day | CUTANEOUS | Status: DC

## 2021-08-19 MED ORDER — CLOPIDOGREL BISULFATE 75 MG PO TABS
75.0000 mg | ORAL_TABLET | Freq: Every day | ORAL | Status: DC
  Administered 2021-08-19: 75 mg via ORAL
  Filled 2021-08-19: qty 1

## 2021-08-19 MED ORDER — AMIODARONE HCL IN DEXTROSE 360-4.14 MG/200ML-% IV SOLN: 30.0000 mg/h | INTRAVENOUS | Status: DC

## 2021-08-19 NOTE — ED Notes (Addendum)
Pt sister at bedside

## 2021-08-19 NOTE — Progress Notes (Signed)
RN called due to patient being in A. fib with RVR with peaks of HR in the 190's and sustained rate between 140-160's.  EKG was done and showed A. fib with RVR.  BP was intermittently soft, patient was started on IV amiodarone drip without the bolus, IV NS 500 mm bolus was given.  Total time:  8 minutes This includes time reviewing the chart including progress notes, labs, EKGs, taking medical decisions, ordering labs and documenting findings.

## 2021-08-19 NOTE — ED Notes (Signed)
Pulse ox not picking up pulse rate correctly, noted with pulse rate of 63 radial.

## 2021-08-19 NOTE — H&P (Signed)
History and Physical  BRYEN HINDERMAN QIO:[id number] DOB: 1945-08-21 DOA: 08/09/2021  Referring physician: Francee Piccolo, EDP PCP: System, Provider Not In  Outpatient Specialists:   Patient Coming From: SNF  Chief Complaint: abnormal MRI  HPI: Jhovani Griswold Futch is a 76 y.o. 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  Patient was getting MRI earlier today due to headache and neck pain.  The MRI showed multiple acute strokes in the left apical, temporal, frontal lobes.  He was brought over for evaluation.  The patient is quite debilitated and is pretty much bedbound.  He and his family members have been asking for hospice/palliative care consult, but this has not occurred yet.  He has had no recurrence of his lung cancer and his latest testing do not show any active brain metastasis.  Emergency Department Course: Laboratory data show creatinine of 1.26 with a baseline of 0.68.  Initial troponin is 29 with repeat pending.  He does have a white count of 16.3.  It appears that he has a baseline leukocytosis and this appears to be around his baseline.  Review of Systems:   Patient complains of mild chest pain  Pt denies any fevers, chills, nausea, vomiting, diarrhea, constipation, abdominal pain, shortness of breath, dyspnea on exertion, orthopnea, cough, wheezing, palpitations, headache, vision changes, lightheadedness, dizziness, melena, rectal bleeding.  Review of systems are otherwise negative  Past Medical History:  Diagnosis Date   Actinic keratosis    AKI (acute kidney injury) ([place]) 06/20/2021   Arthritis    Colitis MAY 2012    CT ABD/PELVIS HEP FLEXURE   COPD (chronic obstructive pulmonary disease) (HCC)    Diabetes mellitus without complication (HCC)    History of kidney stones    Hyperlipemia    Hypertension    NSAID long-term use NAPROXEN FOR OA    SCL Ca dx'd 08/2018   Past Surgical History:  Procedure Laterality Date   BACK SURGERY     spinal stenosis   BASAL CELL CARCINOMA EXCISION  FACE, arms feet, leg   CHOLECYSTECTOMY  JUNE 2011 MJ   STONES, PANCREATITIS   HIP PINNING,CANNULATED Right 02/10/2021   Procedure: CANNULATED HIP PINNING;  Surgeon: Mordecai Rasmussen, MD;  Location: [facility];  Service: Orthopedics;  Laterality: Right;  CRPP of right femoral neck fracture   KNEE ARTHROSCOPY WITH MEDIAL MENISECTOMY Right 03/24/2015   Procedure: KNEE ARTHROSCOPY WITH MEDIAL MENISECTOMY;  Surgeon: Carole Civil, MD;  Location: [facility];  Service: Orthopedics;  Laterality: Right;   KNEE SURGERY Left LEFT   arthroscopy   LITHOTRIPSY  80s   PORTACATH PLACEMENT Left 09/11/2018   Procedure: INSERTION PORT-A-CATH;  Surgeon: Virl Cagey, MD;  Location: [facility];  Service: General;  Laterality: Left;   SPINE SURGERY     Social History:  reports that he has been smoking cigarettes. He has a 50.00 pack-year smoking history. He has never used smokeless tobacco. He reports that he does not currently use alcohol. He reports that he does not use drugs. Patient lives at skilled nursing facility  No Known Allergies  Family History  Problem Relation Age of Onset   Cancer Mother        lung   Cancer Father        lung and liver   Colon polyps Neg Hx    Colon cancer Neg Hx       Prior to Admission medications  Medication Sig Start Date End Date Taking? Authorizing Provider  acetaminophen (TYLENOL) 325 MG tablet Take 2 tablets (650 mg total) by mouth every 6 (six) hours as needed for mild pain (or Fever >/= 101). 02/14/21  Yes Barton Dubois, MD  albuterol (VENTOLIN HFA) 108 (90 Base) MCG/ACT inhaler Inhale 2 puffs into the lungs every 6 (six) hours as needed for wheezing or shortness of breath. 06/24/21  Yes Emokpae, Courage, MD  Amino Acids-Protein Hydrolys (PRO-STAT) LIQD Take 30 mLs by mouth 2 (two) times daily.   Yes [provider]   ascorbic acid (VITAMIN C) 500 MG tablet Take 1 tablet (500 mg total) by mouth daily. 06/25/21  Yes Emokpae, Courage, MD  clopidogrel (PLAVIX) 75 MG tablet Take 1 tablet (75 mg total) by mouth daily. 06/24/21  Yes Emokpae, Courage, MD  guaiFENesin (MUCINEX) 600 MG 12 hr tablet Take 1 tablet (600 mg total) by mouth 2 (two) times daily. 06/24/21  Yes Emokpae, Courage, MD  Ipratropium-Albuterol (COMBIVENT) 20-100 MCG/ACT AERS respimat Inhale 2 puffs into the lungs every 8 (eight) hours. 06/24/21  Yes Roxan Hockey, MD  levothyroxine (SYNTHROID) 150 MCG tablet Take 1 tablet (150 mcg total) by mouth daily before breakfast. 06/24/21  Yes Emokpae, Courage, MD  Nutritional Supplements (RESOURCE 2.0) LIQD Take 120 mLs by mouth 3 (three) times daily.   Yes [provider]  pantoprazole (PROTONIX) 40 MG tablet Take 1 tablet (40 mg total) by mouth daily. 02/15/21  Yes Barton Dubois, MD  polyethylene glycol (MIRALAX / GLYCOLAX) 17 g packet Take 17 g by mouth daily. 06/24/21  Yes Emokpae, Courage, MD  rosuvastatin (CRESTOR) 20 MG tablet Take 1 tablet (20 mg total) by mouth every evening. 06/24/21  Yes Emokpae, Courage, MD  tamsulosin (FLOMAX) 0.4 MG CAPS capsule Take 1 capsule (0.4 mg total) by mouth daily after supper. Patient taking differently: Take 0.4 mg by mouth at bedtime. 06/24/21  Yes Emokpae, Courage, MD  telmisartan (MICARDIS) 40 MG tablet Take 1 tablet (40 mg total) by mouth daily. 06/24/21  Yes Roxan Hockey, MD  thiamine 100 MG tablet Take 1 tablet (100 mg total) by mouth daily. 06/24/21  Yes Emokpae, Courage, MD  zinc sulfate 220 (50 Zn) MG capsule Take 1 capsule (220 mg total) by mouth daily. 06/25/21  Yes Roxan Hockey, MD  aspirin EC 81 MG EC tablet Take 1 tablet (81 mg total) by mouth daily. Swallow whole. Patient not taking: Reported on 08/26/2021 06/25/21   Roxan Hockey, MD  Cholecalciferol 50 MCG (2000 UT) TBDP Take 1 tablet by mouth daily. Patient not taking: Reported  on 08/15/2021 06/24/21   Roxan Hockey, MD  cyanocobalamin 1000 MCG tablet Take 1 tablet (1,000 mcg total) by mouth daily. Patient not taking: Reported on 08/08/2021 06/24/21   Roxan Hockey, MD  Multiple Vitamin (MULTIVITAMIN WITH MINERALS) TABS tablet Take 1 tablet by mouth daily. Patient not taking: Reported on 08/25/2021 06/24/21   Roxan Hockey, MD    Physical Exam: BP 92/64    Pulse (!) 46    Temp (!) 97.5 F (36.4 C) (Oral)    Resp (!) 26    SpO2 100%   General: Elderly male. Awake and alert, but appears quite weak and debilitated. No acute cardiopulmonary distress.  HEENT: Normocephalic atraumatic.  Right and left ears normal in appearance.  Pupils equal, round, reactive to light. Extraocular muscles are intact. Sclerae anicteric and noninjected.  Moist mucosal membranes. No mucosal lesions.  Neck: Neck supple without lymphadenopathy. No carotid bruits. No  masses palpated.  Cardiovascular: Regular rate with normal S1-S2 sounds. No murmurs, rubs, gallops auscultated. No JVD.  Respiratory: Good respiratory effort with no wheezes, rales, rhonchi. Lungs clear to auscultation bilaterally.  No accessory muscle use. Abdomen: Soft, nontender, nondistended. Active bowel sounds. No masses or hepatosplenomegaly  Skin: No rashes, lesions, or ulcerations.  Dry, warm to touch. 2+ dorsalis pedis and radial pulses. Musculoskeletal: No calf or leg pain. All major joints not erythematous nontender.  No upper or lower joint deformation.  Good ROM.  No contractures  Psychiatric: Intact judgment and insight. Pleasant and cooperative. Neurologic: Patient globally weak.           Labs on Admission: I have personally reviewed following labs and imaging studies  CBC: Recent Labs  Lab 08/25/2021 1426 08/27/2021 1519  WBC 16.3*  --   NEUTROABS 15.3*  --   HGB 12.0* 12.9*  HCT 38.3* 38.0*  MCV 91.4  --   PLT 447*  --    Basic Metabolic Panel: Recent Labs  Lab 08/08/2021 1426 08/18/2021 1519  NA  138 138  K 3.9 4.0  CL 100 100  CO2 26  --   GLUCOSE 165* 151*  BUN 23 22  CREATININE 1.26* 1.30*  CALCIUM 8.6*  --    GFR: CrCl cannot be calculated (Unknown ideal weight.). Liver Function Tests: Recent Labs  Lab 08/28/2021 1426  AST 17  ALT 13  ALKPHOS 136*  BILITOT 0.7  PROT 6.5  ALBUMIN 2.4*   No results for input(s): LIPASE, AMYLASE in the last 168 hours. No results for input(s): AMMONIA in the last 168 hours. Coagulation Profile: Recent Labs  Lab 08/28/2021 1426  INR 1.2   Cardiac Enzymes: No results for input(s): CKTOTAL, CKMB, CKMBINDEX, TROPONINI in the last 168 hours. BNP (last 3 results) No results for input(s): PROBNP in the last 8760 hours. HbA1C: No results for input(s): HGBA1C in the last 72 hours. CBG: No results for input(s): GLUCAP in the last 168 hours. Lipid Profile: No results for input(s): CHOL, HDL, LDLCALC, TRIG, CHOLHDL, LDLDIRECT in the last 72 hours. Thyroid Function Tests: No results for input(s): TSH, T4TOTAL, FREET4, T3FREE, THYROIDAB in the last 72 hours. Anemia Panel: No results for input(s): VITAMINB12, FOLATE, FERRITIN, TIBC, IRON, RETICCTPCT in the last 72 hours. Urine analysis:    Component Value Date/Time   COLORURINE AMBER (A) 06/22/2021 2034   APPEARANCEUR CLOUDY (A) 06/22/2021 2034   LABSPEC 1.008 06/22/2021 2034   PHURINE 7.0 06/22/2021 2034   GLUCOSEU NEGATIVE 06/22/2021 2034   HGBUR LARGE (A) 06/22/2021 2034   BILIRUBINUR NEGATIVE 06/22/2021 2034   [place] NEGATIVE 06/22/2021 2034   PROTEINUR 30 (A) 06/22/2021 2034   UROBILINOGEN 0.2 12/22/2010 1203   NITRITE NEGATIVE 06/22/2021 2034   LEUKOCYTESUR LARGE (A) 06/22/2021 2034   Sepsis Labs: @LABRCNTIP (procalcitonin:4,lacticidven:4) )No results found for this or any previous visit (from the past 240 hour(s)).   Radiological Exams on Admission: MR BRAIN WO CONTRAST  Result Date: 08/11/2021 CLINICAL DATA:  Bilateral weakness for 2 months EXAM: MRI HEAD WITHOUT  CONTRAST TECHNIQUE: Multiplanar, multiecho pulse sequences of the brain and surrounding structures were obtained without intravenous contrast. COMPARISON:  04/26/2021 FINDINGS: Brain: There is a small area of cortical and subcortical reduced diffusion in the left occipital lobe. Additional small area of reduced diffusion present within the left temporal lobe adjacent to the temporal horn superiorly and laterally. Third small focus involving the cortex of the medial left precentral gyrus. Confluent areas of T2 hyperintensity in  the supratentorial greater than pontine white matter are nonspecific but probably reflect combination of chronic microvascular and therapy related changes. Chronic infarct of the left corona radiata. Chronic right temporal cortical/subcortical infarct. Small chronic left frontal cortical infarct. Small chronic cerebellar infarcts. Prominence of the ventricles and sulci reflects parenchymal volume loss. There is no intracranial mass or mass effect.  No hydrocephalus. Vascular: Major vessel flow voids at the skull base are preserved. Skull and upper cervical spine: Normal marrow signal is preserved. Sinuses/Orbits: Paranasal sinuses are aerated. Orbits are unremarkable. Other: Sella is unremarkable.  Mastoid air cells are clear. IMPRESSION: Small acute infarcts of the left occipital lobe, left temporal lobe, and left frontal lobe. Chronic infarcts and white matter changes similar to prior. Electronically Signed   By: Macy Mis M.D.   On: 08/24/2021 12:17   MR CERVICAL SPINE WO CONTRAST  Result Date: 09/03/2021 CLINICAL DATA:  Bilateral weakness EXAM: MRI CERVICAL SPINE WITHOUT CONTRAST TECHNIQUE: Multiplanar, multisequence MR imaging of the cervical spine was performed. No intravenous contrast was administered. COMPARISON:  Most recent prior is from 2005 FINDINGS: Degraded by motion artifact. Alignment: Mild degenerative listhesis. For example, retrolisthesis at C3-C4. Vertebrae: Mild  degenerative endplate irregularity. Vertebral body heights are maintained. No substantial marrow edema. No suspicious osseous lesion. Cord: No abnormal signal. Posterior Fossa, vertebral arteries, paraspinal tissues: Unremarkable. Disc levels: C2-C3: Disc bulge. Facet hypertrophy. No significant canal or right foraminal stenosis. Mild left foraminal stenosis. C3-C4: Disc bulge with endplate osteophytes. Uncovertebral and facet hypertrophy. No significant canal stenosis. Moderate to marked right and marked left foraminal stenosis. C4-C5: Disc bulge with endplate osteophytes. Facet greater than uncovertebral hypertrophy. No significant canal stenosis. Marked foraminal stenosis. C5-C6: Disc bulge with endplate osteophytes. Uncovertebral and facet hypertrophy. No significant canal stenosis. Moderate foraminal stenosis. C6-C7: Disc bulge with endplate osteophytes. Uncovertebral and facet hypertrophy. No significant canal stenosis. Marked foraminal stenosis. C7-T1:  No canal or foraminal stenosis. IMPRESSION: Degraded by motion artifact. Multilevel degenerative changes as detailed above with suboptimal stenosis evaluation due to artifact. There is no significant canal stenosis. Multilevel significant foraminal narrowing. Electronically Signed   By: Macy Mis M.D.   On: 08/14/2021 12:22    EKG: Independently reviewed. Sinus tach, with PAC. Possible LVH. No acute ST changes  Assessment/Plan: Principal Problem:   Acute stroke due to ischemia [facility]) Active Problems:   Brain metastasis (HCC)   Type 2 diabetes mellitus without complication (HCC)   Gastroesophageal reflux disease   Benign prostatic hyperplasia with urinary frequency   Hypothyroidism   Benign prostatic hyperplasia without lower urinary tract symptoms   Malignant neoplasm of unspecified part of unspecified bronchus or lung ([place])   Essential hypertension   Leukocytosis    This patient was discussed with the ED physician, including pertinent  vitals, physical exam findings, labs, and imaging.  We also discussed care given by the ED provider.  Acute stroke Discussed w/u with patient and his sister They desire some work up, but also would like to talk with palliative care about hospice. Telemetry MRI/MRA head Carotid Dopplers  Echocardiogram tomorrow Hemoglobin A1c, lipid panel in the morning PT/OT/speech therapy consult Full aspirin, plavix Permissive hypertension SOB Not initially evaluated by ED - check CXR. HTN Hold antihypertensives BP low - will check lactic acid. If elevated, start emperic antibiotics, check blood cultures Fluid bolus now DM2 SSI with CBGs AC and qhs. Hypothyroid Continue levothyroxine BPH Continue flomax H/o lung cancer with brain mets In remission  DVT prophylaxis: Lovenox Consultants: neuro  Code Status: DNR Family Communication: patient's sister  Disposition Plan: pending   Truett Mainland, DO

## 2021-08-19 NOTE — ED Notes (Signed)
Pt noted with congested cough at times. Dr. Nehemiah Settle made aware, new orders for chest xray. Pt sats also noted dropping into high 80% on 2 L via [place], which he chronically wears. Increased O2 to 3lpm. Sats 95% on 3L

## 2021-08-19 NOTE — ED Provider Notes (Signed)
[facility] EMERGENCY DEPARTMENT Provider Note   CSN: [id number] Arrival date & time: 08/16/2021  1242     History  Chief Complaint  Patient presents with   Back Pain   Shortness of Breath    [name] is a 76 y.o. 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  Patient is currently a resident in skilled nursing facility after admission back in mid November 2022 for hypoxic respiratory failure and COVID-19 infection.  The tech who escorted him does not know what these findings show.  I reviewed EMR which shows that the patient brain MRI shows multiple acute infarcts.  Patient is complaining of central chest pain.  The tech from MRI notes that he had bradycardia several times.  Patient sister is at bedside states that he was sent to MRI because of chronic headaches and neck pain.  He has no new neurologic deficits.  He is DNR/DNI.  His sister reports that she has asked for hospice/palliative care consult however this has not occurred.  Patient is not on any current treatment for his known metastatic small cell lung cancer.   Back Pain Shortness of Breath     Home Medications Prior to Admission medications   Medication Sig Start Date End Date Taking? Authorizing Provider  acetaminophen (TYLENOL) 325 MG tablet Take 2 tablets (650 mg total) by mouth every 6 (six) hours as needed for mild pain (or Fever >/= 101). 02/14/21  Yes Barton Dubois, MD  albuterol (VENTOLIN HFA) 108 (90 Base) MCG/ACT inhaler Inhale 2 puffs into the lungs every 6 (six) hours as needed for wheezing or shortness of breath. 06/24/21  Yes Emokpae, Courage, MD  Amino Acids-Protein Hydrolys (PRO-STAT) LIQD Take 30 mLs by mouth 2 (two) times daily.   Yes [provider]  ascorbic acid (VITAMIN C) 500 MG tablet Take 1 tablet (500 mg total) by mouth daily. 06/25/21  Yes Emokpae,  Courage, MD  clopidogrel (PLAVIX) 75 MG tablet Take 1 tablet (75 mg total) by mouth daily. 06/24/21  Yes Emokpae, Courage, MD  guaiFENesin (MUCINEX) 600 MG 12 hr tablet Take 1 tablet (600 mg total) by mouth 2 (two) times daily. 06/24/21  Yes Emokpae, Courage, MD  Ipratropium-Albuterol (COMBIVENT) 20-100 MCG/ACT AERS respimat Inhale 2 puffs into the lungs every 8 (eight) hours. 06/24/21  Yes Roxan Hockey, MD  levothyroxine (SYNTHROID) 150 MCG tablet Take 1 tablet (150 mcg total) by mouth daily before breakfast. 06/24/21  Yes Emokpae, Courage, MD  Nutritional Supplements (RESOURCE 2.0) LIQD Take 120 mLs by mouth 3 (three) times daily.   Yes [provider]  pantoprazole (PROTONIX) 40 MG tablet Take 1 tablet (40 mg total) by mouth daily. 02/15/21  Yes Barton Dubois, MD  polyethylene glycol (MIRALAX / GLYCOLAX) 17 g packet Take 17 g by mouth daily. 06/24/21  Yes Emokpae, Courage, MD  rosuvastatin (CRESTOR) 20 MG tablet Take 1 tablet (20 mg total) by mouth every evening. 06/24/21  Yes Emokpae, Courage, MD  tamsulosin (FLOMAX) 0.4 MG CAPS capsule Take 1 capsule (0.4 mg total) by mouth daily after supper. Patient taking differently: Take 0.4 mg by mouth at bedtime. 06/24/21  Yes Emokpae, Courage, MD  telmisartan (MICARDIS) 40 MG tablet Take 1 tablet (40 mg total) by mouth daily. 06/24/21  Yes Roxan Hockey, MD  thiamine 100 MG tablet Take 1 tablet (100 mg total) by mouth daily.  06/24/21  Yes Emokpae, Courage, MD  zinc sulfate 220 (50 Zn) MG capsule Take 1 capsule (220 mg total) by mouth daily. 06/25/21  Yes Roxan Hockey, MD  aspirin EC 81 MG EC tablet Take 1 tablet (81 mg total) by mouth daily. Swallow whole. Patient not taking: Reported on 08/14/2021 06/25/21   Roxan Hockey, MD  Cholecalciferol 50 MCG (2000 UT) TBDP Take 1 tablet by mouth daily. Patient not taking: Reported on 08/28/2021 06/24/21   Roxan Hockey, MD  cyanocobalamin 1000 MCG tablet Take 1 tablet (1,000 mcg total)  by mouth daily. Patient not taking: Reported on 08/07/2021 06/24/21   Roxan Hockey, MD  Multiple Vitamin (MULTIVITAMIN WITH MINERALS) TABS tablet Take 1 tablet by mouth daily. Patient not taking: Reported on 08/30/2021 06/24/21   Roxan Hockey, MD      Allergies    Patient has no known allergies.    Review of Systems   Review of Systems  Respiratory:  Positive for shortness of breath.   Musculoskeletal:  Positive for back pain.   Physical Exam Updated Vital Signs BP 103/65    Pulse 97    Temp 98.4 F (36.9 C) (Oral)    Resp (!) 27    SpO2 92%  Physical Exam Vitals and nursing note reviewed.  Constitutional:      General: He is not in acute distress.    Appearance: He is well-developed. He is not diaphoretic.  HENT:     Head: Normocephalic and atraumatic.  Eyes:     General: No scleral icterus.    Conjunctiva/sclera: Conjunctivae normal.  Cardiovascular:     Rate and Rhythm: Normal rate and regular rhythm.     Heart sounds: Normal heart sounds.  Pulmonary:     Effort: Pulmonary effort is normal. No respiratory distress.     Breath sounds: Normal breath sounds.  Abdominal:     Palpations: Abdomen is soft.     Tenderness: There is no abdominal tenderness.  Musculoskeletal:     Cervical back: Normal range of motion and neck supple.  Skin:    General: Skin is warm and dry.  Neurological:     Mental Status: He is alert.  Psychiatric:        Behavior: Behavior normal.    ED Results / Procedures / Treatments   Labs (all labs ordered are listed, but only abnormal results are displayed) Labs Reviewed  CBC - Abnormal; Notable for the following components:      Result Value   WBC 16.3 (*)    RBC 4.19 (*)    Hemoglobin 12.0 (*)    HCT 38.3 (*)    RDW 17.6 (*)    Platelets 447 (*)    All other components within normal limits  DIFFERENTIAL - Abnormal; Notable for the following components:   Neutro Abs 15.3 (*)    Lymphs Abs 0.4 (*)    Abs Immature Granulocytes  0.12 (*)    All other components within normal limits  COMPREHENSIVE METABOLIC PANEL - Abnormal; Notable for the following components:   Glucose, Bld 165 (*)    Creatinine, Ser 1.26 (*)    Calcium 8.6 (*)    Albumin 2.4 (*)    Alkaline Phosphatase 136 (*)    GFR, Estimated 59 (*)    All other components within normal limits  I-STAT CHEM 8, ED - Abnormal; Notable for the following components:   Creatinine, Ser 1.30 (*)    Glucose, Bld 151 (*)    Calcium, Ion  1.11 (*)    Hemoglobin 12.9 (*)    HCT 38.0 (*)    All other components within normal limits  CBG MONITORING, ED - Abnormal; Notable for the following components:   Glucose-Capillary 126 (*)    All other components within normal limits  TROPONIN I (HIGH SENSITIVITY) - Abnormal; Notable for the following components:   Troponin I (High Sensitivity) 29 (*)    All other components within normal limits  TROPONIN I (HIGH SENSITIVITY) - Abnormal; Notable for the following components:   Troponin I (High Sensitivity) 31 (*)    All other components within normal limits  RESP PANEL BY RT-PCR (FLU A&B, COVID) ARPGX2  ETHANOL  PROTIME-INR  APTT  RAPID URINE DRUG SCREEN, HOSP PERFORMED  URINALYSIS, ROUTINE W REFLEX MICROSCOPIC  HEMOGLOBIN A1C  HEMOGLOBIN A1C  LIPID PANEL  CBC    EKG None  Radiology MR BRAIN WO CONTRAST  Result Date: 08/26/2021 CLINICAL DATA:  Bilateral weakness for 2 months EXAM: MRI HEAD WITHOUT CONTRAST TECHNIQUE: Multiplanar, multiecho pulse sequences of the brain and surrounding structures were obtained without intravenous contrast. COMPARISON:  04/26/2021 FINDINGS: Brain: There is a small area of cortical and subcortical reduced diffusion in the left occipital lobe. Additional small area of reduced diffusion present within the left temporal lobe adjacent to the temporal horn superiorly and laterally. Third small focus involving the cortex of the medial left precentral gyrus. Confluent areas of T2 hyperintensity  in the supratentorial greater than pontine white matter are nonspecific but probably reflect combination of chronic microvascular and therapy related changes. Chronic infarct of the left corona radiata. Chronic right temporal cortical/subcortical infarct. Small chronic left frontal cortical infarct. Small chronic cerebellar infarcts. Prominence of the ventricles and sulci reflects parenchymal volume loss. There is no intracranial mass or mass effect.  No hydrocephalus. Vascular: Major vessel flow voids at the skull base are preserved. Skull and upper cervical spine: Normal marrow signal is preserved. Sinuses/Orbits: Paranasal sinuses are aerated. Orbits are unremarkable. Other: Sella is unremarkable.  Mastoid air cells are clear. IMPRESSION: Small acute infarcts of the left occipital lobe, left temporal lobe, and left frontal lobe. Chronic infarcts and white matter changes similar to prior. Electronically Signed   By: Macy Mis M.D.   On: 09/02/2021 12:17   MR CERVICAL SPINE WO CONTRAST  Result Date: 08/24/2021 CLINICAL DATA:  Bilateral weakness EXAM: MRI CERVICAL SPINE WITHOUT CONTRAST TECHNIQUE: Multiplanar, multisequence MR imaging of the cervical spine was performed. No intravenous contrast was administered. COMPARISON:  Most recent prior is from 2005 FINDINGS: Degraded by motion artifact. Alignment: Mild degenerative listhesis. For example, retrolisthesis at C3-C4. Vertebrae: Mild degenerative endplate irregularity. Vertebral body heights are maintained. No substantial marrow edema. No suspicious osseous lesion. Cord: No abnormal signal. Posterior Fossa, vertebral arteries, paraspinal tissues: Unremarkable. Disc levels: C2-C3: Disc bulge. Facet hypertrophy. No significant canal or right foraminal stenosis. Mild left foraminal stenosis. C3-C4: Disc bulge with endplate osteophytes. Uncovertebral and facet hypertrophy. No significant canal stenosis. Moderate to marked right and marked left foraminal  stenosis. C4-C5: Disc bulge with endplate osteophytes. Facet greater than uncovertebral hypertrophy. No significant canal stenosis. Marked foraminal stenosis. C5-C6: Disc bulge with endplate osteophytes. Uncovertebral and facet hypertrophy. No significant canal stenosis. Moderate foraminal stenosis. C6-C7: Disc bulge with endplate osteophytes. Uncovertebral and facet hypertrophy. No significant canal stenosis. Marked foraminal stenosis. C7-T1:  No canal or foraminal stenosis. IMPRESSION: Degraded by motion artifact. Multilevel degenerative changes as detailed above with suboptimal stenosis evaluation due to artifact. There is  no significant canal stenosis. Multilevel significant foraminal narrowing. Electronically Signed   By: Macy Mis M.D.   On: 08/23/2021 12:22   US Carotid Bilateral (at [facility] and [facility] only)  Result Date: 09/06/2021 CLINICAL DATA:  Stroke EXAM: BILATERAL CAROTID DUPLEX ULTRASOUND TECHNIQUE: Pearline Cables scale imaging, color Doppler and duplex ultrasound were performed of bilateral carotid and vertebral arteries in the neck. COMPARISON:  CTA 11/10/2020 FINDINGS: Criteria: Quantification of carotid stenosis is based on velocity parameters that correlate the residual internal carotid diameter with NASCET-based stenosis levels, using the diameter of the distal internal carotid lumen as the denominator for stenosis measurement. The following velocity measurements were obtained: RIGHT ICA: 59/13 cm/sec CCA: 60/63 cm/sec SYSTOLIC ICA/CCA RATIO:  1.0 ECA: 109 cm/sec LEFT ICA: 58/25 cm/sec CCA: 01/60 cm/sec SYSTOLIC ICA/CCA RATIO:  0.8 ECA: 90 cm/sec RIGHT CAROTID ARTERY: Mild mixed atherosclerotic plaque is seen within the right carotid bulb with scattered foci calcified plaque seen within the proximal right internal carotid artery. RIGHT VERTEBRAL ARTERY:  Antegrade LEFT CAROTID ARTERY: Mild to moderate largely calcified atherosclerotic plaque is seen within the left carotid bulb and proximal left  internal carotid artery. LEFT VERTEBRAL ARTERY:  Antegrade IMPRESSION: No evidence of hemodynamically significant stenosis involving the carotid bifurcations bilaterally. Electronically Signed   By: Fidela Salisbury M.D.   On: 08/29/2021 17:43   DG CHEST PORT 1 VIEW  Result Date: 09/01/2021 CLINICAL DATA:  Shortness of breath EXAM: PORTABLE CHEST 1 VIEW COMPARISON:  06/20/2021 FINDINGS: Left-sided Port-A-Cath with the tip projecting over the SVC. Bilateral paramediastinal fibrosis. Right perihilar fibrosis. No new areas of focal consolidation. No pleural effusion or pneumothorax. Heart and mediastinal contours are unremarkable. Elevation of the left diaphragm. No acute osseous abnormality. IMPRESSION: No acute cardiopulmonary disease. Electronically Signed   By: Kathreen Devoid M.D.   On: 08/25/2021 16:38    Procedures Procedures  \ Medications Ordered in ED Medications  lactated ringers bolus 500 mL (500 mLs Intravenous Bolus 09/02/2021 1633)    Followed by  lactated ringers infusion ( Intravenous New Bag/Given 08/10/2021 1636)  rosuvastatin (CRESTOR) tablet 20 mg (20 mg Oral Given 08/15/2021 1802)  levothyroxine (SYNTHROID) tablet 150 mcg (has no administration in time range)  pantoprazole (PROTONIX) EC tablet 40 mg (40 mg Oral Given 08/10/2021 1802)  polyethylene glycol (MIRALAX / GLYCOLAX) packet 17 g (17 g Oral Given 08/30/2021 1804)  tamsulosin (FLOMAX) capsule 0.4 mg (has no administration in time range)  clopidogrel (PLAVIX) tablet 75 mg (75 mg Oral Given 08/17/2021 1802)   stroke: mapping our early stages of recovery book (0 each Does not apply Hold 08/18/2021 1735)  acetaminophen (TYLENOL) tablet 650 mg (has no administration in time range)    Or  acetaminophen (TYLENOL) 160 MG/5ML solution 650 mg (has no administration in time range)    Or  acetaminophen (TYLENOL) suppository 650 mg (has no administration in time range)  enoxaparin (LOVENOX) injection 40 mg (40 mg Subcutaneous Given 09/06/2021 1802)   aspirin suppository 300 mg ( Rectal See Alternative 08/27/2021 1802)    Or  aspirin tablet 325 mg (325 mg Oral Given 09/05/2021 1802)  insulin aspart (novoLOG) injection 0-5 Units (has no administration in time range)  insulin aspart (novoLOG) injection 0-15 Units (2 Units Subcutaneous Given 08/22/2021 1807)  ipratropium-albuterol (DUONEB) 0.5-2.5 (3) MG/3ML nebulizer solution 3 mL (has no administration in time range)  albuterol (PROVENTIL) (2.5 MG/3ML) 0.083% nebulizer solution 2.5 mg (has no administration in time range)    ED Course/ Medical Decision Making/ A&P  Clinical Course as of 08/17/2021 1846  Fri Aug 19, 2021  1506 Ekg sinus tachycardia rate of 114- I interpreted [AH]  1507 Case discussed with Dr. Theda Sers- recommends admission and hospice/palliative care. Recommends ASA at this time [AH]    Clinical Course User Index [AH] Margarita Mail, PA-C                           Medical Decision Making   This patient presents to the ED after findings of acute stroke on MRI imaging, this involves an extensive number of treatment options, and is a complaint that carries with it a high risk of complications and morbidity.  The differential diagnosis includes Afib, neoplastic hypercoagulable state.   Co morbidities that complicate the patient evaluation  End-stage small cell lung carcinoma, metastatic disease, debilitation   Additional history obtained:  Additional history obtained from MRI tech, patient's sister   Lab Tests:  I Ordered, and personally interpreted labs.  The pertinent results include: See BC with elevated white blood cell count of 16,000, mild anemia at baseline, CMP with elevated blood glucose of 165, creatinine shows AKI.  Troponin elevated and this appears to be chronic for the patient.   Imaging Studies ordered:    Cardiac Monitoring:  The patient was maintained on a cardiac monitor.  I personally viewed and interpreted the cardiac monitored which showed an  underlying rhythm of: Sinus tachycardia at a rate of 114   Medicines ordered and prescription drug management:    Test Considered:     Critical Interventions:     Consultations Obtained:  I requested consultation with the neurologist Dr. Theda Sers,  and discussed lab and imaging findings as well as pertinent plan - they recommend: Inpatient admission   Problem List / ED Course:     Reevaluation:  After the interventions noted above, I reevaluated the patient and found that they have :stayed the same     Dispostion:  After consideration of the diagnostic results and the patients response to treatment, I feel that the patent would benefit from admission.   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

## 2021-08-19 NOTE — ED Triage Notes (Signed)
Patient sent over from MRI

## 2021-08-20 ENCOUNTER — Inpatient Hospital Stay (HOSPITAL_COMMUNITY): Payer: Medicare Other

## 2021-08-20 DIAGNOSIS — C7931 Secondary malignant neoplasm of brain: Secondary | ICD-10-CM

## 2021-08-20 DIAGNOSIS — J9611 Chronic respiratory failure with hypoxia: Secondary | ICD-10-CM

## 2021-08-20 DIAGNOSIS — N179 Acute kidney failure, unspecified: Secondary | ICD-10-CM

## 2021-08-20 DIAGNOSIS — I10 Essential (primary) hypertension: Secondary | ICD-10-CM

## 2021-08-20 DIAGNOSIS — I639 Cerebral infarction, unspecified: Secondary | ICD-10-CM

## 2021-08-20 DIAGNOSIS — Z7189 Other specified counseling: Secondary | ICD-10-CM

## 2021-08-20 DIAGNOSIS — C3490 Malignant neoplasm of unspecified part of unspecified bronchus or lung: Secondary | ICD-10-CM

## 2021-08-20 LAB — CBC
HCT: 33.9 % — ABNORMAL LOW (ref 39.0–52.0)
Hemoglobin: 10.3 g/dL — ABNORMAL LOW (ref 13.0–17.0)
MCH: 27.6 pg (ref 26.0–34.0)
MCHC: 30.4 g/dL (ref 30.0–36.0)
MCV: 90.9 fL (ref 80.0–100.0)
Platelets: 344 10*3/uL (ref 150–400)
RBC: 3.73 MIL/uL — ABNORMAL LOW (ref 4.22–5.81)
RDW: 17.5 % — ABNORMAL HIGH (ref 11.5–15.5)
WBC: 15.8 10*3/uL — ABNORMAL HIGH (ref 4.0–10.5)
nRBC: 0 % (ref 0.0–0.2)

## 2021-08-20 LAB — LIPID PANEL
Cholesterol: 64 mg/dL (ref 0–200)
HDL: 19 mg/dL — ABNORMAL LOW (ref >40)
LDL Cholesterol: 35 mg/dL (ref 0–99)
Total CHOL/HDL Ratio: 3.4 RATIO
Triglycerides: 48 mg/dL (ref <150)
VLDL: 10 mg/dL (ref 0–40)

## 2021-08-20 LAB — HEMOGLOBIN A1C
Hgb A1c MFr Bld: 5.8 % — ABNORMAL HIGH (ref 4.8–5.6)
Mean Plasma Glucose: 119.76 mg/dL

## 2021-08-20 MED ORDER — GLYCOPYRROLATE 0.2 MG/ML IJ SOLN
0.1000 mg | Freq: Four times a day (QID) | INTRAMUSCULAR | Status: DC | PRN
  Administered 2021-08-20 – 2021-08-21 (×3): 0.1 mg via INTRAVENOUS
  Filled 2021-08-20 (×3): qty 1

## 2021-08-20 MED ORDER — LORAZEPAM 2 MG/ML IJ SOLN: 1.0000 mg | INTRAMUSCULAR | Status: DC | PRN

## 2021-08-20 MED ORDER — ONDANSETRON HCL 4 MG/2ML IJ SOLN: 4.0000 mg | Freq: Four times a day (QID) | INTRAMUSCULAR | Status: DC | PRN

## 2021-08-20 MED ORDER — HYDROMORPHONE HCL 1 MG/ML IJ SOLN
1.0000 mg | INTRAMUSCULAR | Status: DC | PRN
Start: 2021-08-20 — End: 2021-08-21
  Administered 2021-08-20 – 2021-08-21 (×3): 1 mg via INTRAVENOUS
  Filled 2021-08-20 (×4): qty 1

## 2021-08-20 NOTE — Progress Notes (Signed)
Family concerned with pts breathing. No PRN treatments are ordered at this time. Patient is not wheezing and just resting. Breath sounds are coarse and rhonchos. Discussed with family that what may benefit pt at this time would be NTS suctioning, but family declined that at this time. Family going to call RN for pain medicine.

## 2021-08-20 NOTE — Progress Notes (Addendum)
ON CALL PHONE CONSULT  Call from Dr Tat  @ [facility]  Discussion Patient with lung cancer with mets to the brain, prior stroke, presenting for evaluation of headache and neck pain for which an MRI was done that showed acute strokes in the left parietal temporal and frontal lobes of embolic looking pattern.  Overnight also had A. fib with RVR.  Strokes likely secondary to atrial fibrillation as well as a component of hypercoagulability from his cancer cannot be excluded-but are completely incidental and he did not come in with sudden onset of focal neurological deficits.  No mass was identified on the brain MRI although the MRI was without contrast.  In this patient's case, I recommended that he can be started on Eliquis in about a day or 2 and Plavix can be discontinued at that time.  Given his known cancer history and now with A. fib, a truncated work-up at [facility] with starting Eliquis should be fine.  He had a complete stroke work-up done last year, at that time he did not have atrial fibrillation which has since then now been diagnosed.  Carotid Dopplers unremarkable.  Even if we find a LV thrombus on echo, treatment will be anticoagulation which we would need anyway given his atrial fibrillation.  If the family still wants full scope of care, anticoagulation with Eliquis should be started for stroke prevention.  Outpatient follow-up in the next 2 to 4 weeks.  PT OT speech therapy. -- Amie Portland, MD Neurologist [facility] Pager: [phone]

## 2021-08-20 NOTE — Progress Notes (Signed)
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  The patient currently resides at [facility].  He had recently had a hospitalization from 06/20/2021 to 06/24/2021 for COVID-19 pneumonia and AKI.  He was discharged back to [facility] at that time.  The patient has been complaining of headaches and neck pain.  This was the reasoning for the MRI of the brain.  When MRI results returned and showed a small acute infarct in the left occipital, left temporal, and left frontal lobes the patient was transferred to the emergency department for further work-up and admission.  At baseline, the patient has a poor functional status.  He is essentially bedbound and requires assistance with all his ADLs.  However, he is able to feed himself.  His sister states that he is conversant, but only speaks a few words.  He is able to recognize family, but she notes that he has had a cognitive and functional decline in the last several months. The patient himself does not have any acute complaints.  Specifically, he denies any fevers, chills, chest pain, shortness breath, cough, hemoptysis, nausea, vomiting, diarrhea, abdominal pain.  He is unable to provide any significant history secondary to his cognitive impairment.  Notably, the patient was also recently admitted to the hospital in July 2022 for right femoral neck fracture for which ORIF was performed on 02/10/2021.  He also suffered a stroke in April 2022 affecting the left caudate nucleus.  This was felt to be secondary to small vessel disease.  He was placed on aspirin and Plavix for 3 weeks and then converted to Plavix  monotherapy.  Over the evening of 08/18/2021-08/20/21, patient developed atrial fibrillation with RVR requiring an amiodarone drip.  In addition, the patient has been hypotensive with systolic blood pressures in the 80-90 range.  Chest x-ray showed chronic exertional markings and right perihilar fibrosis.  WBC 16.3, hemoglobin 12.0, platelets 447,000.  BMP shows sodium 138, potassium 3.9, bicarbonate 26, serum creatinine 1.26.  EKG showed atrial fibrillation with nonspecific ST changes. I discussed the patient's acute ischemic stroke with neurology, Dr. Rory Percy.  In light of the patient's poor functional status and previous full stroke work-up, he advocated for truncated work-up at [facility].  He stated that goals of care discussion should be undertaken with the patient's family regarding further aggressive therapy.  Should the family want to continue with full scope of care, he would recommend starting apixaban.  On the morning of 08/20/2021, the patient is awake and able to answer simple questions and follows one-step commands.  However he is confused.  I had a goals of care discussion with the patient's sisters.  I discussed the patient's present medical condition and summarized his current medical condition and discussed plans going forward.  I discussed the patient's overall poor prognosis given his poor baseline functional status, numerous comorbidities, and concerns for new infectious process in the setting of acute ischemic stroke.  The patient's sisters who have been his main advocates and decision makers expressed at that the patient's quality of life has remained poor.  They understand that his medical condition and functional status will continue  to decline and they foresee recurrent frequent hospitalizations.  As such, they validate that the patient has no quality of life and continues to suffer.  They did not want to pursue any further heroic measures.  They expressed that they did not want any  further escalation care.  I have discussed comfort measure focused care going forward.  They agree with discontinuing any further lab work, diagnostic studies, and any therapy with curative intent.  Going forward, I will discontinue any further blood work, radiographic studies, and therapies with curative intent.  The patient's life expectancy at this point is hours to a few days.  Assessment/Plan: Acute ischemic stroke -08/18/2021 MRI brain--acute infarct left occipital, left temporal, left frontal rolled -Carotid ultrasound--no hemodynamically significant stenosis -After discussion of goals of care with the patient's family, they wanted to focus on comfort measure care. -Hemoglobin A1c 5.7 -As such, no further work-up will be initiated  Chronic respiratory failure with hypoxia/COPD -Currently stable on 3 L  Acute kidney injury -Baseline creatinine 0.6-troponin -Patient presented with serum creatinine 1.26 -Secondary to volume depletion -Focus of care has been changed to comfort measures only  Atrial fibrillation with RVR, new onset, type unspecified -Patient was started on amiodarone drip on the evening of 08/24/2021 -After goals of care discussion with the patient's family they wish to transition the patient's focus of care to full comfort -As such, no further work-up will be initiated  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 -Durvalumab maintenance last dose on 02/13/2019, subsequent doses held due to colitis. --CT CAP on 03/09/2020 shows stable radiation changes involving the paramediastinal lungs and hilar regions bilaterally.  No evidence of recurrence or metastatic disease.  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  Stable small left adrenal gland nodules. -He had 3 small subcentimeter meta stasis in the right insula, right occipital  lobe and right cerebellar hemisphere, treated with whole brain RT from 09/02/2018 through 09/13/2018. -MRI of the brain on 09/18/2019 did not show any evidence of intracranial metastasis. -Brain MRI on 01/16/2020 no brain metastasis.  Stable to slightly increased postradiation changes in the cerebral white matter. -Clinically and radiologically no evidence of recurrence of his metastatic small cell cancer  Diabetes mellitus type 2 -08/19/2020 hemoglobin A1c 5.7 -Focus of care has been transitioned to comfort care focus -Discontinue CBG checks  Essential hypertension -Patient is hypotensive -Discontinue antihypertensive medications  Elevated troponin -Secondary to demand ischemia -No chest pain -No further work-up as the patient is now comfort care focused  Hypothyroidism -Previously on Synthroid -Currently comfort care focused  Hyperlipidemia -Previously on statin -Currently for comfort care focused  BPH -Previously on tamsulosin -Currently comfort care comfort focused            Family Communication:  sisters updated 1/14  Consultants:  none  Code Status:  FULL COMFORT  DVT Prophylaxis:  FULL COMFORT   Procedures: As Listed in Progress Note Above  Antibiotics: None      Subjective: Patient denies fevers, chills, headache, chest pain, dyspnea, nausea, vomiting, diarrhea, abdominal pain, dysuria,   Objective: Vitals:   08/20/21 0300 08/20/21 0400 08/20/21 0500 08/20/21 0600  BP: (!) 102/57 (!) 78/60 (!) 75/49 (!) 83/56  Pulse: 79 82 76 75  Resp: (!) 21 19 19 19   Temp:      TempSrc:      SpO2: 97% 99% 96% 97%  Weight:  Height:        Intake/Output Summary (Last 24 hours) at 08/20/2021 0733 Last data filed at 08/20/2021 0643 Gross per 24 hour  Intake 1226.72 ml  Output 225 ml  Net 1001.72 ml   Weight change:  Exam:  General:  Pt is alert, follows commands appropriately, not in acute distress HEENT: No icterus, No thrush, No neck mass,  [place]/AT Cardiovascular: RRR, S1/S2, no rubs, no gallops Respiratory: bilateral rhonchi Abdomen: Soft/+BS, non tender, non distended, no guarding Extremities: No edema, No lymphangitis, No petechiae, No rashes, no synovitis   Data Reviewed: I have personally reviewed following labs and imaging studies Basic Metabolic Panel: Recent Labs  Lab 08/15/2021 1426 08/17/2021 1519  NA 138 138  K 3.9 4.0  CL 100 100  CO2 26  --   GLUCOSE 165* 151*  BUN 23 22  CREATININE 1.26* 1.30*  CALCIUM 8.6*  --    Liver Function Tests: Recent Labs  Lab 08/16/2021 1426  AST 17  ALT 13  ALKPHOS 136*  BILITOT 0.7  PROT 6.5  ALBUMIN 2.4*   No results for input(s): LIPASE, AMYLASE in the last 168 hours. No results for input(s): AMMONIA in the last 168 hours. Coagulation Profile: Recent Labs  Lab 09/06/2021 1426  INR 1.2   CBC: Recent Labs  Lab 08/31/2021 1426 08/08/2021 1519 08/20/21 0544  WBC 16.3*  --  15.8*  NEUTROABS 15.3*  --   --   HGB 12.0* 12.9* 10.3*  HCT 38.3* 38.0* 33.9*  MCV 91.4  --  90.9  PLT 447*  --  344   Cardiac Enzymes: No results for input(s): CKTOTAL, CKMB, CKMBINDEX, TROPONINI in the last 168 hours. BNP: Invalid input(s): POCBNP CBG: Recent Labs  Lab 08/20/2021 1740 09/03/2021 2145  GLUCAP 126* 91   HbA1C: Recent Labs    08/30/2021 1426  HGBA1C 5.7*   Urine analysis:    Component Value Date/Time   COLORURINE AMBER (A) 06/22/2021 2034   APPEARANCEUR CLOUDY (A) 06/22/2021 2034   LABSPEC 1.008 06/22/2021 2034   PHURINE 7.0 06/22/2021 2034   GLUCOSEU NEGATIVE 06/22/2021 2034   HGBUR LARGE (A) 06/22/2021 2034   BILIRUBINUR NEGATIVE 06/22/2021 2034   [place] NEGATIVE 06/22/2021 2034   PROTEINUR 30 (A) 06/22/2021 2034   UROBILINOGEN 0.2 12/22/2010 1203   NITRITE NEGATIVE 06/22/2021 2034   LEUKOCYTESUR LARGE (A) 06/22/2021 2034   Sepsis Labs: @LABRCNTIP (procalcitonin:4,lacticidven:4) ) Recent Results (from the past 240 hour(s))  Resp Panel by RT-PCR (Flu  A&B, Covid) Nasopharyngeal Swab     Status: None   Collection Time: 08/11/2021  2:19 PM   Specimen: Nasopharyngeal Swab; Nasopharyngeal(NP) swabs in vial transport medium  Result Value Ref Range Status   SARS Coronavirus 2 by RT PCR NEGATIVE NEGATIVE Final    Comment: (NOTE) SARS-CoV-2 target nucleic acids are NOT DETECTED.  The SARS-CoV-2 RNA is generally detectable in upper respiratory specimens during the acute phase of infection. The lowest concentration of SARS-CoV-2 viral copies this assay can detect is 138 copies/mL. A negative result does not preclude SARS-Cov-2 infection and should not be used as the sole basis for treatment or other patient management decisions. A negative result may occur with  improper specimen collection/handling, submission of specimen other than nasopharyngeal swab, presence of viral mutation(s) within the areas targeted by this assay, and inadequate number of viral copies(<138 copies/mL). A negative result must be combined with clinical observations, patient history, and epidemiological information. The expected result is Negative.  Fact Sheet for Patients:  EntrepreneurPulse.com.au  Fact Sheet for Healthcare Providers:  IncredibleEmployment.be  This test is no t yet approved or cleared by the [place] FDA and  has been authorized for detection and/or diagnosis of SARS-CoV-2 by FDA under an Emergency Use Authorization (EUA). This EUA will remain  in effect (meaning this test can be used) for the duration of the COVID-19 declaration under Section 564(b)(1) of the Act, 21 U.S.C.section 360bbb-3(b)(1), unless the authorization is terminated  or revoked sooner.       Influenza A by PCR NEGATIVE NEGATIVE Final   Influenza B by PCR NEGATIVE NEGATIVE Final    Comment: (NOTE) The Xpert Xpress SARS-CoV-2/FLU/RSV plus assay is intended as an aid in the diagnosis of influenza from Nasopharyngeal swab specimens  and should not be used as a sole basis for treatment. Nasal washings and aspirates are unacceptable for Xpert Xpress SARS-CoV-2/FLU/RSV testing.  Fact Sheet for Patients: EntrepreneurPulse.com.au  Fact Sheet for Healthcare Providers: IncredibleEmployment.be  This test is not yet approved or cleared by the [place] FDA and has been authorized for detection and/or diagnosis of SARS-CoV-2 by FDA under an Emergency Use Authorization (EUA). This EUA will remain in effect (meaning this test can be used) for the duration of the COVID-19 declaration under Section 564(b)(1) of the Act, 21 U.S.C. section 360bbb-3(b)(1), unless the authorization is terminated or revoked.  Performed at [facility], [address] [place], [place] [address]      Scheduled Meds:   stroke: mapping our early stages of recovery book   Does not apply Once   aspirin  300 mg Rectal Daily   Or   aspirin  325 mg Oral Daily   Chlorhexidine Gluconate Cloth  6 each Topical Q0600   clopidogrel  75 mg Oral Daily   enoxaparin (LOVENOX) injection  40 mg Subcutaneous Q24H   insulin aspart  0-15 Units Subcutaneous TID WC   insulin aspart  0-5 Units Subcutaneous QHS   ipratropium-albuterol  3 mL Nebulization TID   levothyroxine  150 mcg Oral QAC breakfast   mouth rinse  15 mL Mouth Rinse BID   pantoprazole  40 mg Oral Daily   polyethylene glycol  17 g Oral Daily   rosuvastatin  20 mg Oral QPM   tamsulosin  0.4 mg Oral QHS   Continuous Infusions:  amiodarone 30 mg/hr (08/20/21 0532)   lactated ringers 100 mL/hr at 08/20/21 0334    Procedures/Studies: MR BRAIN WO CONTRAST  Result Date: 09/03/2021 CLINICAL DATA:  Bilateral weakness for 2 months EXAM: MRI HEAD WITHOUT CONTRAST TECHNIQUE: Multiplanar, multiecho pulse sequences of the brain and surrounding structures were obtained without intravenous contrast. COMPARISON:  04/26/2021 FINDINGS: Brain: There is a small area of  cortical and subcortical reduced diffusion in the left occipital lobe. Additional small area of reduced diffusion present within the left temporal lobe adjacent to the temporal horn superiorly and laterally. Third small focus involving the cortex of the medial left precentral gyrus. Confluent areas of T2 hyperintensity in the supratentorial greater than pontine white matter are nonspecific but probably reflect combination of chronic microvascular and therapy related changes. Chronic infarct of the left corona radiata. Chronic right temporal cortical/subcortical infarct. Small chronic left frontal cortical infarct. Small chronic cerebellar infarcts. Prominence of the ventricles and sulci reflects parenchymal volume loss. There is no intracranial mass or mass effect.  No hydrocephalus. Vascular: Major vessel flow voids at the skull base are preserved. Skull and upper cervical spine: Normal marrow signal is preserved. Sinuses/Orbits: Paranasal sinuses are aerated.  Orbits are unremarkable. Other: Sella is unremarkable.  Mastoid air cells are clear. IMPRESSION: Small acute infarcts of the left occipital lobe, left temporal lobe, and left frontal lobe. Chronic infarcts and white matter changes similar to prior. Electronically Signed   By: Macy Mis M.D.   On: 08/11/2021 12:17   MR CERVICAL SPINE WO CONTRAST  Result Date: 08/29/2021 CLINICAL DATA:  Bilateral weakness EXAM: MRI CERVICAL SPINE WITHOUT CONTRAST TECHNIQUE: Multiplanar, multisequence MR imaging of the cervical spine was performed. No intravenous contrast was administered. COMPARISON:  Most recent prior is from 2005 FINDINGS: Degraded by motion artifact. Alignment: Mild degenerative listhesis. For example, retrolisthesis at C3-C4. Vertebrae: Mild degenerative endplate irregularity. Vertebral body heights are maintained. No substantial marrow edema. No suspicious osseous lesion. Cord: No abnormal signal. Posterior Fossa, vertebral arteries, paraspinal  tissues: Unremarkable. Disc levels: C2-C3: Disc bulge. Facet hypertrophy. No significant canal or right foraminal stenosis. Mild left foraminal stenosis. C3-C4: Disc bulge with endplate osteophytes. Uncovertebral and facet hypertrophy. No significant canal stenosis. Moderate to marked right and marked left foraminal stenosis. C4-C5: Disc bulge with endplate osteophytes. Facet greater than uncovertebral hypertrophy. No significant canal stenosis. Marked foraminal stenosis. C5-C6: Disc bulge with endplate osteophytes. Uncovertebral and facet hypertrophy. No significant canal stenosis. Moderate foraminal stenosis. C6-C7: Disc bulge with endplate osteophytes. Uncovertebral and facet hypertrophy. No significant canal stenosis. Marked foraminal stenosis. C7-T1:  No canal or foraminal stenosis. IMPRESSION: Degraded by motion artifact. Multilevel degenerative changes as detailed above with suboptimal stenosis evaluation due to artifact. There is no significant canal stenosis. Multilevel significant foraminal narrowing. Electronically Signed   By: Macy Mis M.D.   On: 08/24/2021 12:22   US Carotid Bilateral (at [facility] and [facility] only)  Result Date: 08/18/2021 CLINICAL DATA:  Stroke EXAM: BILATERAL CAROTID DUPLEX ULTRASOUND TECHNIQUE: Pearline Cables scale imaging, color Doppler and duplex ultrasound were performed of bilateral carotid and vertebral arteries in the neck. COMPARISON:  CTA 11/10/2020 FINDINGS: Criteria: Quantification of carotid stenosis is based on velocity parameters that correlate the residual internal carotid diameter with NASCET-based stenosis levels, using the diameter of the distal internal carotid lumen as the denominator for stenosis measurement. The following velocity measurements were obtained: RIGHT ICA: 59/13 cm/sec CCA: 25/42 cm/sec SYSTOLIC ICA/CCA RATIO:  1.0 ECA: 109 cm/sec LEFT ICA: 58/25 cm/sec CCA: 70/62 cm/sec SYSTOLIC ICA/CCA RATIO:  0.8 ECA: 90 cm/sec RIGHT CAROTID ARTERY: Mild mixed  atherosclerotic plaque is seen within the right carotid bulb with scattered foci calcified plaque seen within the proximal right internal carotid artery. RIGHT VERTEBRAL ARTERY:  Antegrade LEFT CAROTID ARTERY: Mild to moderate largely calcified atherosclerotic plaque is seen within the left carotid bulb and proximal left internal carotid artery. LEFT VERTEBRAL ARTERY:  Antegrade IMPRESSION: No evidence of hemodynamically significant stenosis involving the carotid bifurcations bilaterally. Electronically Signed   By: Fidela Salisbury M.D.   On: 08/31/2021 17:43   DG CHEST PORT 1 VIEW  Result Date: 08/30/2021 CLINICAL DATA:  Shortness of breath EXAM: PORTABLE CHEST 1 VIEW COMPARISON:  06/20/2021 FINDINGS: Left-sided Port-A-Cath with the tip projecting over the SVC. Bilateral paramediastinal fibrosis. Right perihilar fibrosis. No new areas of focal consolidation. No pleural effusion or pneumothorax. Heart and mediastinal contours are unremarkable. Elevation of the left diaphragm. No acute osseous abnormality. IMPRESSION: No acute cardiopulmonary disease. Electronically Signed   By: Kathreen Devoid M.D.   On: 08/10/2021 16:38    Orson Eva, DO  [facility]  If 7PM-7AM, please contact night-coverage [email or web address] Password [facility] 08/20/2021, 7:33 AM  LOS: 1 day

## 2021-08-20 NOTE — TOC Progression Note (Signed)
°  Transition of Care [facility]) Screening Note   Patient Details  Name: [name] Date of Birth: 10-Feb-1946   Transition of Care [facility]) CM/SW Contact:    Shade Flood, LCSW Phone Number: 08/20/2021, 10:38 AM   Received [facility] consult for SNF/DME needs. Per MD, pt is on comfort measures and an in hospital death is anticipated. Consult cleared.  Transition of Care Department [facility]) has reviewed patient and no TOC needs have been identified at this time. We will continue to monitor patient advancement through interdisciplinary progression rounds. If new patient transition needs arise, please place a TOC consult.

## 2021-08-20 NOTE — Progress Notes (Signed)
Met with family at bedside (3 sisters, 1 brother) -updated them on patient's condition -discussed GOC again -discussed patient's poor prognosis and overall decline -they agree and expressed they did not want patient to suffer -family re-affirms transition to comfort focused care  Shanon Brow Danee Soller

## 2021-08-21 DIAGNOSIS — J9611 Chronic respiratory failure with hypoxia: Secondary | ICD-10-CM | POA: Diagnosis not present

## 2021-08-21 DIAGNOSIS — I639 Cerebral infarction, unspecified: Secondary | ICD-10-CM | POA: Diagnosis not present

## 2021-08-21 DIAGNOSIS — C7931 Secondary malignant neoplasm of brain: Secondary | ICD-10-CM | POA: Diagnosis not present

## 2021-08-21 DIAGNOSIS — N179 Acute kidney failure, unspecified: Secondary | ICD-10-CM | POA: Diagnosis not present

## 2021-08-21 MED ORDER — GLYCOPYRROLATE 0.2 MG/ML IJ SOLN: 0.1000 mg | INTRAMUSCULAR | Status: DC

## 2021-08-21 MED ORDER — SODIUM CHLORIDE 0.9 % IV SOLN
1.0000 mg/h | INTRAVENOUS | Status: DC
  Filled 2021-08-21: qty 2.5

## 2021-08-22 LAB — GLUCOSE, CAPILLARY: Glucose-Capillary: 112 mg/dL — ABNORMAL HIGH (ref 70–99)

## 2021-08-29 ENCOUNTER — Ambulatory Visit: Payer: Medicare Other | Admitting: Urology

## 2021-09-07 NOTE — Progress Notes (Signed)
Nutrition Brief Note  Chart reviewed. Pt now transitioning to comfort care.  No further nutrition interventions planned at this time.  Please re-consult as needed.   Erling Arrazola W, RD, LDN, CDCES Registered Dietitian II Certified Diabetes Care and Education Specialist Please refer to AMION for RD and/or RD on-call/weekend/after hours pager   

## 2021-09-07 NOTE — Progress Notes (Signed)
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  The patient currently resides at [facility].  He had recently had a hospitalization from 06/20/2021 to 06/24/2021 for COVID-19 pneumonia and AKI.  He was discharged back to [facility] at that time.  The patient has been complaining of headaches and neck pain.  This was the reasoning for the MRI of the brain.  When MRI results returned and showed a small acute infarct in the left occipital, left temporal, and left frontal lobes the patient was transferred to the emergency department for further work-up and admission.  At baseline, the patient has a poor functional status.  He is essentially bedbound and requires assistance with all his ADLs.  However, he is able to feed himself.  His sister states that he is conversant, but only speaks a few words.  He is able to recognize family, but she notes that he has had a cognitive and functional decline in the last several months. The patient himself does not have any acute complaints.  Specifically, he denies any fevers, chills, chest pain, shortness breath, cough, hemoptysis, nausea, vomiting, diarrhea, abdominal pain.  He is unable to provide any significant history secondary to his cognitive impairment.   Notably, the patient was also recently admitted to the hospital in July 2022 for right femoral neck fracture for which ORIF was performed on 02/10/2021.  He also suffered a stroke in April 2022 affecting the left caudate nucleus.  This was felt to be secondary to small vessel disease.  He was placed on aspirin and Plavix for 3 weeks and then converted to Plavix  monotherapy.   Over the evening of 08/16/2021-08/20/21, patient developed atrial fibrillation with RVR requiring an amiodarone drip.  In addition, the patient has been hypotensive with systolic blood pressures in the 80-90 range.  Chest x-ray showed chronic exertional markings and right perihilar fibrosis.  WBC 16.3, hemoglobin 12.0, platelets 447,000.  BMP shows sodium 138, potassium 3.9, bicarbonate 26, serum creatinine 1.26.  EKG showed atrial fibrillation with nonspecific ST changes. I discussed the patient's acute ischemic stroke with neurology, Dr. Rory Percy.  In light of the patient's poor functional status and previous full stroke work-up, he advocated for truncated work-up at [facility].  He stated that goals of care discussion should be undertaken with the patient's family regarding further aggressive therapy.  Should the family want to continue with full scope of care, he would recommend starting apixaban.   On the morning of 08/20/2021, the patient is awake and able to answer simple questions and follows one-step commands.  However he is confused.  I had a goals of care discussion with the patient's sisters.  I discussed the patient's present medical condition and summarized his current medical condition and discussed plans going forward.  I discussed the patient's overall poor prognosis given his poor baseline functional status, numerous comorbidities, and concerns for new infectious process in the setting of acute ischemic stroke.  The patient's sisters who have been his main advocates and decision makers expressed at that the patient's quality of life has remained poor.  They understand that his medical condition and functional  status will continue to decline and they foresee recurrent frequent hospitalizations.  As such, they validate that the patient has no quality of life and continues to suffer.  They did not want to pursue any further heroic measures.  They expressed that they did not want any  further escalation care.  I have discussed comfort measure focused care going forward.  They agree with discontinuing any further lab work, diagnostic studies, and any therapy with curative intent.  Going forward, I will discontinue any further blood work, radiographic studies, and therapies with curative intent.  The patient's life expectancy at this point is hours to a few days.   Assessment/Plan: Acute ischemic stroke -08/31/2021 MRI brain--acute infarct left occipital, left temporal, left frontal rolled -Carotid ultrasound--no hemodynamically significant stenosis -After discussion of goals of care with the patient's family, they wanted to focus on comfort measure care. -Hemoglobin A1c 5.7 -As such, no further work-up will be initiated -Focus of care has been changed to comfort measures only   Chronic respiratory failure with hypoxia/COPD -initially stable on 3 L -now focused on full comfort measures   Acute kidney injury -Baseline creatinine 0.6-troponin -Patient presented with serum creatinine 1.26 -Secondary to volume depletion -Focus of care has been changed to comfort measures only   Atrial fibrillation with RVR, new onset, type unspecified -Patient was started on amiodarone drip on the evening of 08/23/2021 -After goals of care discussion with the patient's family they wish to transition the patient's focus of care to full comfort -As such, no further work-up will be initiated   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 -Durvalumab maintenance last dose on 02/13/2019, subsequent doses held due to colitis. --CT CAP on 03/09/2020 shows stable radiation changes involving the paramediastinal lungs and hilar regions bilaterally.  No evidence of recurrence or metastatic disease.  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  Stable small left  adrenal gland nodules. -He had 3 small subcentimeter meta stasis in the right insula, right occipital lobe and right cerebellar hemisphere, treated with whole brain RT from 09/02/2018 through 09/13/2018. -MRI of the brain on 09/18/2019 did not show any evidence of intracranial metastasis. -Brain MRI on 01/16/2020 no brain metastasis.  Stable to slightly increased postradiation changes in the cerebral white matter. -Clinically and radiologically no evidence of recurrence of his metastatic small cell cancer   Diabetes mellitus type 2 -08/19/2020 hemoglobin A1c 5.7 -Focus of care has been transitioned to comfort care focus -Discontinue CBG checks   Essential hypertension -Patient is hypotensive -Discontinue antihypertensive medications   Elevated troponin -Secondary to demand ischemia -No chest pain -No further work-up as the patient is now comfort care focused   Hypothyroidism -Previously on Synthroid -Currently comfort care focused   Hyperlipidemia -Previously on statin -Currently for comfort care focused   BPH -Previously on tamsulosin -Currently comfort care comfort focused   Goals of Care -Advance care planning, including the explanation and discussion of advance directives was carried out with the patient and family.  Code status including explanations of "Full Code" and "DNR" and alternatives were discussed in detail.  Discussion of end-of-life issues including but not limited palliative care, hospice care and the concept of hospice, other end-of-life care options, power of attorney for health care decisions, living wills, and physician orders for life-sustaining treatment were also discussed with the patient and family.  Total face to face time 16 minutes. -  updated sisters at bedside -initiate dilaudid drip -change robinul to scheduled dosing                   Family Communication:  sisters updated 1/15   Consultants:  none   Code Status:  FULL COMFORT   DVT  Prophylaxis:  FULL COMFORT     Procedures: As Listed in Progress Note Above   Antibiotics: None  Subjective: Patient resting.  No resp distress.  No vomiting.  Objective: Vitals:   08/20/21 1100 08/20/21 1200 08/20/21 1330 08/20/21 1854  BP: (!) 83/58 98/62  104/77  Pulse: 78 68 66 97  Resp: (!) 21 19 (!) 22 13  Temp:    98.5 F (36.9 C)  TempSrc:    Oral  SpO2: 92% 97% 96% (!) 78%  Weight:      Height:        Intake/Output Summary (Last 24 hours) at 09-06-2021 1157 Last data filed at 09/06/2021 0900 Gross per 24 hour  Intake 0 ml  Output 500 ml  Net -500 ml   Weight change:  Exam:  General:  Pt is somnolent.  No distress HEENT: No icterus, [place]/AT Cardiovascular: RRR, S1/S2, no rubs, no gallops Respiratory: scattered rales.  No wheeze Abdomen: Soft/+BS,  non distended Extremities: No edema, No lymphangitis   Data Reviewed: I have personally reviewed following labs and imaging studies Basic Metabolic Panel: Recent Labs  Lab 09/05/2021 1426 08/31/2021 1519  NA 138 138  K 3.9 4.0  CL 100 100  CO2 26  --   GLUCOSE 165* 151*  BUN 23 22  CREATININE 1.26* 1.30*  CALCIUM 8.6*  --    Liver Function Tests: Recent Labs  Lab 08/31/2021 1426  AST 17  ALT 13  ALKPHOS 136*  BILITOT 0.7  PROT 6.5  ALBUMIN 2.4*   No results for input(s): LIPASE, AMYLASE in the last 168 hours. No results for input(s): AMMONIA in the last 168 hours. Coagulation Profile: Recent Labs  Lab 08/29/2021 1426  INR 1.2   CBC: Recent Labs  Lab 08/30/2021 1426 08/17/2021 1519 08/20/21 0544  WBC 16.3*  --  15.8*  NEUTROABS 15.3*  --   --   HGB 12.0* 12.9* 10.3*  HCT 38.3* 38.0* 33.9*  MCV 91.4  --  90.9  PLT 447*  --  344   Cardiac Enzymes: No results for input(s): CKTOTAL, CKMB, CKMBINDEX, TROPONINI in the last 168 hours. BNP: Invalid input(s): POCBNP CBG: Recent Labs  Lab 08/24/2021 1740 08/25/2021 2145  GLUCAP 126* 91   HbA1C: Recent Labs    08/18/2021 1426 08/20/21 0544   HGBA1C 5.7* 5.8*   Urine analysis:    Component Value Date/Time   COLORURINE AMBER (A) 06/22/2021 2034   APPEARANCEUR CLOUDY (A) 06/22/2021 2034   LABSPEC 1.008 06/22/2021 2034   PHURINE 7.0 06/22/2021 2034   GLUCOSEU NEGATIVE 06/22/2021 2034   HGBUR LARGE (A) 06/22/2021 2034   BILIRUBINUR NEGATIVE 06/22/2021 2034   [place] NEGATIVE 06/22/2021 2034   PROTEINUR 30 (A) 06/22/2021 2034   UROBILINOGEN 0.2 12/22/2010 1203   NITRITE NEGATIVE 06/22/2021 2034   LEUKOCYTESUR LARGE (A) 06/22/2021 2034   Sepsis Labs: @LABRCNTIP (procalcitonin:4,lacticidven:4) ) Recent Results (from the past 240 hour(s))  Resp Panel by RT-PCR (Flu A&B, Covid) Nasopharyngeal Swab     Status: None   Collection Time: 08/08/2021  2:19 PM   Specimen: Nasopharyngeal Swab; Nasopharyngeal(NP) swabs in vial transport medium  Result Value Ref Range Status   SARS Coronavirus 2 by RT  PCR NEGATIVE NEGATIVE Final    Comment: (NOTE) SARS-CoV-2 target nucleic acids are NOT DETECTED.  The SARS-CoV-2 RNA is generally detectable in upper respiratory specimens during the acute phase of infection. The lowest concentration of SARS-CoV-2 viral copies this assay can detect is 138 copies/mL. A negative result does not preclude SARS-Cov-2 infection and should not be used as the sole basis for treatment or other patient management decisions. A negative result may occur with  improper specimen collection/handling, submission of specimen other than nasopharyngeal swab, presence of viral mutation(s) within the areas targeted by this assay, and inadequate number of viral copies(<138 copies/mL). A negative result must be combined with clinical observations, patient history, and epidemiological information. The expected result is Negative.  Fact Sheet for Patients:  EntrepreneurPulse.com.au  Fact Sheet for Healthcare Providers:  IncredibleEmployment.be  This test is no t yet approved or  cleared by the [place] FDA and  has been authorized for detection and/or diagnosis of SARS-CoV-2 by FDA under an Emergency Use Authorization (EUA). This EUA will remain  in effect (meaning this test can be used) for the duration of the COVID-19 declaration under Section 564(b)(1) of the Act, 21 U.S.C.section 360bbb-3(b)(1), unless the authorization is terminated  or revoked sooner.       Influenza A by PCR NEGATIVE NEGATIVE Final   Influenza B by PCR NEGATIVE NEGATIVE Final    Comment: (NOTE) The Xpert Xpress SARS-CoV-2/FLU/RSV plus assay is intended as an aid in the diagnosis of influenza from Nasopharyngeal swab specimens and should not be used as a sole basis for treatment. Nasal washings and aspirates are unacceptable for Xpert Xpress SARS-CoV-2/FLU/RSV testing.  Fact Sheet for Patients: EntrepreneurPulse.com.au  Fact Sheet for Healthcare Providers: IncredibleEmployment.be  This test is not yet approved or cleared by the [place] FDA and has been authorized for detection and/or diagnosis of SARS-CoV-2 by FDA under an Emergency Use Authorization (EUA). This EUA will remain in effect (meaning this test can be used) for the duration of the COVID-19 declaration under Section 564(b)(1) of the Act, 21 U.S.C. section 360bbb-3(b)(1), unless the authorization is terminated or revoked.  Performed at [facility], [address], [place], [place] [address]      Scheduled Meds:   stroke: mapping our early stages of recovery book   Does not apply Once   glycopyrrolate  0.1 mg Intravenous Q4H   Continuous Infusions:  HYDROmorphone      Procedures/Studies: MR BRAIN WO CONTRAST  Result Date: 08/18/2021 CLINICAL DATA:  Bilateral weakness for 2 months EXAM: MRI HEAD WITHOUT CONTRAST TECHNIQUE: Multiplanar, multiecho pulse sequences of the brain and surrounding structures were obtained without intravenous contrast. COMPARISON:  04/26/2021  FINDINGS: Brain: There is a small area of cortical and subcortical reduced diffusion in the left occipital lobe. Additional small area of reduced diffusion present within the left temporal lobe adjacent to the temporal horn superiorly and laterally. Third small focus involving the cortex of the medial left precentral gyrus. Confluent areas of T2 hyperintensity in the supratentorial greater than pontine white matter are nonspecific but probably reflect combination of chronic microvascular and therapy related changes. Chronic infarct of the left corona radiata. Chronic right temporal cortical/subcortical infarct. Small chronic left frontal cortical infarct. Small chronic cerebellar infarcts. Prominence of the ventricles and sulci reflects parenchymal volume loss. There is no intracranial mass or mass effect.  No hydrocephalus. Vascular: Major vessel flow voids at the skull base are preserved. Skull and upper cervical spine: Normal marrow signal is preserved. Sinuses/Orbits:  Paranasal sinuses are aerated. Orbits are unremarkable. Other: Sella is unremarkable.  Mastoid air cells are clear. IMPRESSION: Small acute infarcts of the left occipital lobe, left temporal lobe, and left frontal lobe. Chronic infarcts and white matter changes similar to prior. Electronically Signed   By: Macy Mis M.D.   On: 08/16/2021 12:17   MR CERVICAL SPINE WO CONTRAST  Result Date: 09/01/2021 CLINICAL DATA:  Bilateral weakness EXAM: MRI CERVICAL SPINE WITHOUT CONTRAST TECHNIQUE: Multiplanar, multisequence MR imaging of the cervical spine was performed. No intravenous contrast was administered. COMPARISON:  Most recent prior is from 2005 FINDINGS: Degraded by motion artifact. Alignment: Mild degenerative listhesis. For example, retrolisthesis at C3-C4. Vertebrae: Mild degenerative endplate irregularity. Vertebral body heights are maintained. No substantial marrow edema. No suspicious osseous lesion. Cord: No abnormal signal.  Posterior Fossa, vertebral arteries, paraspinal tissues: Unremarkable. Disc levels: C2-C3: Disc bulge. Facet hypertrophy. No significant canal or right foraminal stenosis. Mild left foraminal stenosis. C3-C4: Disc bulge with endplate osteophytes. Uncovertebral and facet hypertrophy. No significant canal stenosis. Moderate to marked right and marked left foraminal stenosis. C4-C5: Disc bulge with endplate osteophytes. Facet greater than uncovertebral hypertrophy. No significant canal stenosis. Marked foraminal stenosis. C5-C6: Disc bulge with endplate osteophytes. Uncovertebral and facet hypertrophy. No significant canal stenosis. Moderate foraminal stenosis. C6-C7: Disc bulge with endplate osteophytes. Uncovertebral and facet hypertrophy. No significant canal stenosis. Marked foraminal stenosis. C7-T1:  No canal or foraminal stenosis. IMPRESSION: Degraded by motion artifact. Multilevel degenerative changes as detailed above with suboptimal stenosis evaluation due to artifact. There is no significant canal stenosis. Multilevel significant foraminal narrowing. Electronically Signed   By: Macy Mis M.D.   On: 08/12/2021 12:22   US Carotid Bilateral (at [facility] and [facility] only)  Result Date: 08/20/2021 CLINICAL DATA:  Stroke EXAM: BILATERAL CAROTID DUPLEX ULTRASOUND TECHNIQUE: Pearline Cables scale imaging, color Doppler and duplex ultrasound were performed of bilateral carotid and vertebral arteries in the neck. COMPARISON:  CTA 11/10/2020 FINDINGS: Criteria: Quantification of carotid stenosis is based on velocity parameters that correlate the residual internal carotid diameter with NASCET-based stenosis levels, using the diameter of the distal internal carotid lumen as the denominator for stenosis measurement. The following velocity measurements were obtained: RIGHT ICA: 59/13 cm/sec CCA: 16/10 cm/sec SYSTOLIC ICA/CCA RATIO:  1.0 ECA: 109 cm/sec LEFT ICA: 58/25 cm/sec CCA: 96/04 cm/sec SYSTOLIC ICA/CCA RATIO:  0.8 ECA: 90  cm/sec RIGHT CAROTID ARTERY: Mild mixed atherosclerotic plaque is seen within the right carotid bulb with scattered foci calcified plaque seen within the proximal right internal carotid artery. RIGHT VERTEBRAL ARTERY:  Antegrade LEFT CAROTID ARTERY: Mild to moderate largely calcified atherosclerotic plaque is seen within the left carotid bulb and proximal left internal carotid artery. LEFT VERTEBRAL ARTERY:  Antegrade IMPRESSION: No evidence of hemodynamically significant stenosis involving the carotid bifurcations bilaterally. Electronically Signed   By: Fidela Salisbury M.D.   On: 08/31/2021 17:43   DG CHEST PORT 1 VIEW  Result Date: 08/28/2021 CLINICAL DATA:  Shortness of breath EXAM: PORTABLE CHEST 1 VIEW COMPARISON:  06/20/2021 FINDINGS: Left-sided Port-A-Cath with the tip projecting over the SVC. Bilateral paramediastinal fibrosis. Right perihilar fibrosis. No new areas of focal consolidation. No pleural effusion or pneumothorax. Heart and mediastinal contours are unremarkable. Elevation of the left diaphragm. No acute osseous abnormality. IMPRESSION: No acute cardiopulmonary disease. Electronically Signed   By: Kathreen Devoid M.D.   On: 09/06/2021 16:38    Orson Eva, DO  [facility]  If 7PM-7AM, please contact night-coverage [email or web address] Password [facility] 08/26/2021,  11:57 AM   LOS: 2 days

## 2021-09-07 NOTE — Death Summary Note (Signed)
DEATH SUMMARY   Patient Details  Name: [name] MRN: [id number] DOB: 06-14-1946  Admission/Discharge Information   Admit Date:  09/07/2021  Date of Death: Date of Death: September 09, 2021  Time of Death: Time of Death: 11-02-20  Length of Stay: 2  Referring Physician: System, Provider Not In   Reason(s) for Hospitalization  Acute ischemic stroke  Diagnoses  Preliminary cause of death: extensive stage small cell lung cancer Secondary Diagnoses (including complications and co-morbidities):  Acute ischemic stroke -07-Sep-2021 MRI brain--acute infarct left occipital, left temporal, left frontal rolled -Carotid ultrasound--no hemodynamically significant stenosis -After discussion of goals of care with the patient's family, they wanted to focus on comfort measure care. -Hemoglobin A1c 5.7 -As such, no further work-up will be initiated -Focus of care has been changed to comfort measures only   Chronic respiratory failure with hypoxia/COPD -initially stable on 3 L -now focused on full comfort measures   Acute kidney injury -Baseline creatinine 0.6-troponin -Patient presented with serum creatinine 1.26 -Secondary to volume depletion -Focus of care has been changed to comfort measures only   Atrial fibrillation with RVR, new onset, type unspecified -Patient was started on amiodarone drip on the evening of 2021-09-07 -After goals of care discussion with the patient's family they wish to transition the patient's focus of care to full comfort -As such, no further work-up will be initiated   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 -Durvalumab maintenance last dose on 02/13/2019, subsequent doses held due to colitis. --CT CAP on 03/09/2020 shows stable radiation changes involving the paramediastinal lungs and hilar regions bilaterally.  No evidence of recurrence or  metastatic disease.  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  Stable small left adrenal gland nodules. -He had 3 small subcentimeter meta stasis in the right insula, right occipital lobe and right cerebellar hemisphere, treated with whole brain RT from 09/02/2018 through 09/13/2018. -MRI of the brain on 09/18/2019 did not show any evidence of intracranial metastasis. -Brain MRI on 01/16/2020 no brain metastasis.  Stable to slightly increased postradiation changes in the cerebral white matter. -Clinically and radiologically no evidence of recurrence of his metastatic small cell cancer   Diabetes mellitus type 2 -2020-09-07 hemoglobin A1c 5.7 -Focus of care has been transitioned to comfort care focus -Discontinue CBG checks   Essential hypertension -Patient is hypotensive -Discontinue antihypertensive medications   Elevated troponin -Secondary to demand ischemia -No chest pain -No further work-up as the patient is now comfort care focused   Hypothyroidism -Previously on Synthroid -Currently comfort care focused   Hyperlipidemia -Previously on statin -Currently for comfort care focused   BPH -Previously on tamsulosin -Currently comfort care comfort focused   Goals of Care -Advance care planning, including the explanation and discussion of advance directives was carried out with the patient and family.  Code status including explanations of "Full Code" and "DNR" and alternatives were discussed in detail.  Discussion of end-of-life issues including but not limited palliative care, hospice care and the concept of hospice, other end-of-life care options, power of attorney for health care decisions, living wills, and physician orders for life-sustaining treatment were also discussed with the patient and family.  Total face to face time 16 minutes. -updated sisters at bedside -initiate dilaudid drip -change robinul to scheduled dosing    Brief Hospital Course (including  significant findings, care, treatment, and services provided and events leading to death)  [name] is a 76 year old male with a history of Extensive stage small cell lung cancer with metastasis to the brain, COPD, chronic respiratory failure on 2 L, diabetes mellitus type 2, hypertension, hyperlipidemia, stroke, tobacco abuse presenting to the hospital with an acute stroke.  The patient currently resides at [facility].  He had recently had a hospitalization from 06/20/2021 to 06/24/2021 for COVID-19 pneumonia and AKI.  He was discharged back to [facility] at that time.  The patient has been complaining of headaches and neck pain.  This was the reasoning for the MRI of the brain.  When MRI results returned and showed a small acute infarct in the left occipital, left temporal, and left frontal lobes the patient was transferred to the emergency department for further work-up and admission.  At baseline, the patient has a poor functional status.  He is essentially bedbound and requires assistance with all his ADLs.  However, he is able to feed himself.  His sister states that he is conversant, but only speaks a few words.  He is able to recognize family, but she notes that he has had a cognitive and functional decline in the last several months. The patient himself does not have any acute complaints.  Specifically, he denies any fevers, chills, chest pain, shortness breath, cough, hemoptysis, nausea, vomiting, diarrhea, abdominal pain.  He is unable to provide any significant history secondary to his cognitive impairment.   Notably, the patient was also recently admitted to the hospital in July 2022 for right femoral neck fracture for which ORIF was performed on 02/10/2021.  He also suffered a stroke in April 2022 affecting the left caudate nucleus.  This was felt to be secondary to small vessel disease.  He was placed on aspirin and Plavix for 3 weeks and then converted to Plavix monotherapy.   Over the  evening of 08/12/2021-08/20/21, patient developed atrial fibrillation with RVR requiring an amiodarone drip.  In addition, the patient has been hypotensive with systolic blood pressures in the 80-90 range.  Chest x-ray showed chronic exertional markings and right perihilar fibrosis.  WBC 16.3, hemoglobin 12.0, platelets 447,000.  BMP shows sodium 138, potassium 3.9, bicarbonate 26, serum creatinine 1.26.  EKG showed atrial fibrillation with nonspecific ST changes. I discussed the patient's acute ischemic stroke with neurology, Dr. Rory Percy.  In light of the patient's poor functional status and previous full stroke work-up, he advocated for truncated work-up at [facility].  He stated that goals of care discussion should be undertaken with the patient's family regarding further aggressive therapy.  Should the family want to continue with full scope of care, he would recommend starting apixaban.   On the morning of 08/20/2021, the patient is awake and able to answer simple questions and follows one-step commands.  However he is confused.  I had a goals of care discussion with the patient's sisters.  I discussed the patient's present medical condition and summarized his current medical condition and discussed plans going forward.  I discussed the patient's overall poor prognosis given his poor baseline functional status, numerous comorbidities, and concerns for new infectious process in the setting of acute ischemic stroke.  The patient's sisters who have been his main advocates and decision makers expressed at that the patient's quality of life has remained poor.  They understand that his medical condition and functional status will continue to decline and they foresee recurrent frequent hospitalizations.  As such, they validate that the patient has no quality of life  and continues to suffer.  They did not want to pursue any further heroic measures.  They expressed that they did not want any further escalation care.  I  have discussed comfort measure focused care going forward.  They agree with discontinuing any further lab work, diagnostic studies, and any therapy with curative intent.  Going forward, I will discontinue any further blood work, radiographic studies, and therapies with curative intent.  The patient's life expectancy at this point is hours to a few days.    Pertinent Labs and Studies  Significant Diagnostic Studies MR BRAIN WO CONTRAST  Result Date: 09/05/2021 CLINICAL DATA:  Bilateral weakness for 2 months EXAM: MRI HEAD WITHOUT CONTRAST TECHNIQUE: Multiplanar, multiecho pulse sequences of the brain and surrounding structures were obtained without intravenous contrast. COMPARISON:  04/26/2021 FINDINGS: Brain: There is a small area of cortical and subcortical reduced diffusion in the left occipital lobe. Additional small area of reduced diffusion present within the left temporal lobe adjacent to the temporal horn superiorly and laterally. Third small focus involving the cortex of the medial left precentral gyrus. Confluent areas of T2 hyperintensity in the supratentorial greater than pontine white matter are nonspecific but probably reflect combination of chronic microvascular and therapy related changes. Chronic infarct of the left corona radiata. Chronic right temporal cortical/subcortical infarct. Small chronic left frontal cortical infarct. Small chronic cerebellar infarcts. Prominence of the ventricles and sulci reflects parenchymal volume loss. There is no intracranial mass or mass effect.  No hydrocephalus. Vascular: Major vessel flow voids at the skull base are preserved. Skull and upper cervical spine: Normal marrow signal is preserved. Sinuses/Orbits: Paranasal sinuses are aerated. Orbits are unremarkable. Other: Sella is unremarkable.  Mastoid air cells are clear. IMPRESSION: Small acute infarcts of the left occipital lobe, left temporal lobe, and left frontal lobe. Chronic infarcts and white  matter changes similar to prior. Electronically Signed   By: Macy Mis M.D.   On: 08/14/2021 12:17   MR CERVICAL SPINE WO CONTRAST  Result Date: 09/05/2021 CLINICAL DATA:  Bilateral weakness EXAM: MRI CERVICAL SPINE WITHOUT CONTRAST TECHNIQUE: Multiplanar, multisequence MR imaging of the cervical spine was performed. No intravenous contrast was administered. COMPARISON:  Most recent prior is from 2005 FINDINGS: Degraded by motion artifact. Alignment: Mild degenerative listhesis. For example, retrolisthesis at C3-C4. Vertebrae: Mild degenerative endplate irregularity. Vertebral body heights are maintained. No substantial marrow edema. No suspicious osseous lesion. Cord: No abnormal signal. Posterior Fossa, vertebral arteries, paraspinal tissues: Unremarkable. Disc levels: C2-C3: Disc bulge. Facet hypertrophy. No significant canal or right foraminal stenosis. Mild left foraminal stenosis. C3-C4: Disc bulge with endplate osteophytes. Uncovertebral and facet hypertrophy. No significant canal stenosis. Moderate to marked right and marked left foraminal stenosis. C4-C5: Disc bulge with endplate osteophytes. Facet greater than uncovertebral hypertrophy. No significant canal stenosis. Marked foraminal stenosis. C5-C6: Disc bulge with endplate osteophytes. Uncovertebral and facet hypertrophy. No significant canal stenosis. Moderate foraminal stenosis. C6-C7: Disc bulge with endplate osteophytes. Uncovertebral and facet hypertrophy. No significant canal stenosis. Marked foraminal stenosis. C7-T1:  No canal or foraminal stenosis. IMPRESSION: Degraded by motion artifact. Multilevel degenerative changes as detailed above with suboptimal stenosis evaluation due to artifact. There is no significant canal stenosis. Multilevel significant foraminal narrowing. Electronically Signed   By: Macy Mis M.D.   On: 08/07/2021 12:22   US Carotid Bilateral (at [facility] and [facility] only)  Result Date: 08/31/2021 CLINICAL DATA:  Stroke  EXAM: BILATERAL CAROTID DUPLEX ULTRASOUND TECHNIQUE: Pearline Cables scale imaging, color Doppler and duplex ultrasound were  performed of bilateral carotid and vertebral arteries in the neck. COMPARISON:  CTA 11/10/2020 FINDINGS: Criteria: Quantification of carotid stenosis is based on velocity parameters that correlate the residual internal carotid diameter with NASCET-based stenosis levels, using the diameter of the distal internal carotid lumen as the denominator for stenosis measurement. The following velocity measurements were obtained: RIGHT ICA: 59/13 cm/sec CCA: 03/47 cm/sec SYSTOLIC ICA/CCA RATIO:  1.0 ECA: 109 cm/sec LEFT ICA: 58/25 cm/sec CCA: 42/59 cm/sec SYSTOLIC ICA/CCA RATIO:  0.8 ECA: 90 cm/sec RIGHT CAROTID ARTERY: Mild mixed atherosclerotic plaque is seen within the right carotid bulb with scattered foci calcified plaque seen within the proximal right internal carotid artery. RIGHT VERTEBRAL ARTERY:  Antegrade LEFT CAROTID ARTERY: Mild to moderate largely calcified atherosclerotic plaque is seen within the left carotid bulb and proximal left internal carotid artery. LEFT VERTEBRAL ARTERY:  Antegrade IMPRESSION: No evidence of hemodynamically significant stenosis involving the carotid bifurcations bilaterally. Electronically Signed   By: Fidela Salisbury M.D.   On: 08/15/2021 17:43   DG CHEST PORT 1 VIEW  Result Date: 08/17/2021 CLINICAL DATA:  Shortness of breath EXAM: PORTABLE CHEST 1 VIEW COMPARISON:  06/20/2021 FINDINGS: Left-sided Port-A-Cath with the tip projecting over the SVC. Bilateral paramediastinal fibrosis. Right perihilar fibrosis. No new areas of focal consolidation. No pleural effusion or pneumothorax. Heart and mediastinal contours are unremarkable. Elevation of the left diaphragm. No acute osseous abnormality. IMPRESSION: No acute cardiopulmonary disease. Electronically Signed   By: Kathreen Devoid M.D.   On: 08/20/2021 16:38    Microbiology Recent Results (from the past 240 hour(s))  Resp  Panel by RT-PCR (Flu A&B, Covid) Nasopharyngeal Swab     Status: None   Collection Time: 09/05/2021  2:19 PM   Specimen: Nasopharyngeal Swab; Nasopharyngeal(NP) swabs in vial transport medium  Result Value Ref Range Status   SARS Coronavirus 2 by RT PCR NEGATIVE NEGATIVE Final    Comment: (NOTE) SARS-CoV-2 target nucleic acids are NOT DETECTED.  The SARS-CoV-2 RNA is generally detectable in upper respiratory specimens during the acute phase of infection. The lowest concentration of SARS-CoV-2 viral copies this assay can detect is 138 copies/mL. A negative result does not preclude SARS-Cov-2 infection and should not be used as the sole basis for treatment or other patient management decisions. A negative result may occur with  improper specimen collection/handling, submission of specimen other than nasopharyngeal swab, presence of viral mutation(s) within the areas targeted by this assay, and inadequate number of viral copies(<138 copies/mL). A negative result must be combined with clinical observations, patient history, and epidemiological information. The expected result is Negative.  Fact Sheet for Patients:  EntrepreneurPulse.com.au  Fact Sheet for Healthcare Providers:  IncredibleEmployment.be  This test is no t yet approved or cleared by the [place] FDA and  has been authorized for detection and/or diagnosis of SARS-CoV-2 by FDA under an Emergency Use Authorization (EUA). This EUA will remain  in effect (meaning this test can be used) for the duration of the COVID-19 declaration under Section 564(b)(1) of the Act, 21 U.S.C.section 360bbb-3(b)(1), unless the authorization is terminated  or revoked sooner.       Influenza A by PCR NEGATIVE NEGATIVE Final   Influenza B by PCR NEGATIVE NEGATIVE Final    Comment: (NOTE) The Xpert Xpress SARS-CoV-2/FLU/RSV plus assay is intended as an aid in the diagnosis of influenza from Nasopharyngeal  swab specimens and should not be used as a sole basis for treatment. Nasal washings and aspirates are unacceptable for Xpert Xpress SARS-CoV-2/FLU/RSV  testing.  Fact Sheet for Patients: EntrepreneurPulse.com.au  Fact Sheet for Healthcare Providers: IncredibleEmployment.be  This test is not yet approved or cleared by the [place] FDA and has been authorized for detection and/or diagnosis of SARS-CoV-2 by FDA under an Emergency Use Authorization (EUA). This EUA will remain in effect (meaning this test can be used) for the duration of the COVID-19 declaration under Section 564(b)(1) of the Act, 21 U.S.C. section 360bbb-3(b)(1), unless the authorization is terminated or revoked.  Performed at [facility], [address], [place], [place] [address]     Lab Basic Metabolic Panel: Recent Labs  Lab 08/08/2021 1426 08/29/2021 1519  NA 138 138  K 3.9 4.0  CL 100 100  CO2 26  --   GLUCOSE 165* 151*  BUN 23 22  CREATININE 1.26* 1.30*  CALCIUM 8.6*  --    Liver Function Tests: Recent Labs  Lab 08/22/2021 1426  AST 17  ALT 13  ALKPHOS 136*  BILITOT 0.7  PROT 6.5  ALBUMIN 2.4*   No results for input(s): LIPASE, AMYLASE in the last 168 hours. No results for input(s): AMMONIA in the last 168 hours. CBC: Recent Labs  Lab 08/07/2021 1426 08/31/2021 1519 08/20/21 0544  WBC 16.3*  --  15.8*  NEUTROABS 15.3*  --   --   HGB 12.0* 12.9* 10.3*  HCT 38.3* 38.0* 33.9*  MCV 91.4  --  90.9  PLT 447*  --  344   Cardiac Enzymes: No results for input(s): CKTOTAL, CKMB, CKMBINDEX, TROPONINI in the last 168 hours. Sepsis Labs: Recent Labs  Lab 08/17/2021 1426 08/20/21 0544  WBC 16.3* 15.8*    Procedures/Operations  none   Sydelle Sherfield 08-27-2021, 2:33 PM

## 2021-09-07 NOTE — Progress Notes (Signed)
Time of death 1222. MD Tat notified. Patient found unresponsive. Family and staff in room. Assessment complete. Postmortem care flowsheet complete. [facility] called.

## 2021-09-07 DEATH — deceased
